# Patient Record
Sex: Female | Born: 1960
Health system: Southern US, Community
[De-identification: ages and names within clinical notes are randomized; demographics above are authoritative.]

## PROBLEM LIST (undated history)

## (undated) DIAGNOSIS — U071 COVID-19: Secondary | ICD-10-CM

## (undated) DIAGNOSIS — K319 Disease of stomach and duodenum, unspecified: Secondary | ICD-10-CM

## (undated) DIAGNOSIS — K5790 Diverticulosis of intestine, part unspecified, without perforation or abscess without bleeding: Secondary | ICD-10-CM

## (undated) DIAGNOSIS — R112 Nausea with vomiting, unspecified: Secondary | ICD-10-CM

## (undated) DIAGNOSIS — K529 Noninfective gastroenteritis and colitis, unspecified: Secondary | ICD-10-CM

## (undated) DIAGNOSIS — K219 Gastro-esophageal reflux disease without esophagitis: Secondary | ICD-10-CM

## (undated) DIAGNOSIS — Z9889 Other specified postprocedural states: Secondary | ICD-10-CM

## (undated) DIAGNOSIS — B9681 Helicobacter pylori [H. pylori] as the cause of diseases classified elsewhere: Secondary | ICD-10-CM

## (undated) DIAGNOSIS — K2970 Gastritis, unspecified, without bleeding: Secondary | ICD-10-CM

## (undated) DIAGNOSIS — E119 Type 2 diabetes mellitus without complications: Secondary | ICD-10-CM

## (undated) DIAGNOSIS — K589 Irritable bowel syndrome without diarrhea: Secondary | ICD-10-CM

## (undated) DIAGNOSIS — T7840XA Allergy, unspecified, initial encounter: Secondary | ICD-10-CM

## (undated) DIAGNOSIS — K449 Diaphragmatic hernia without obstruction or gangrene: Secondary | ICD-10-CM

## (undated) DIAGNOSIS — S82891A Other fracture of right lower leg, initial encounter for closed fracture: Secondary | ICD-10-CM

## (undated) HISTORY — DX: Other fracture of right lower leg, initial encounter for closed fracture: S82.891A

## (undated) HISTORY — DX: Irritable bowel syndrome, unspecified: K58.9

## (undated) HISTORY — DX: Type 2 diabetes mellitus without complications: E11.9

## (undated) HISTORY — DX: Allergy, unspecified, initial encounter: T78.40XA

## (undated) HISTORY — DX: Diaphragmatic hernia without obstruction or gangrene: K44.9

## (undated) HISTORY — DX: Helicobacter pylori (H. pylori) as the cause of diseases classified elsewhere: K29.70

## (undated) HISTORY — DX: Gastro-esophageal reflux disease without esophagitis: K21.9

## (undated) HISTORY — DX: Noninfective gastroenteritis and colitis, unspecified: K52.9

## (undated) HISTORY — DX: Diverticulosis of intestine, part unspecified, without perforation or abscess without bleeding: K57.90

## (undated) HISTORY — DX: Disease of stomach and duodenum, unspecified: K31.9

## (undated) HISTORY — PX: SHOULDER SURGERY: SHX246

## (undated) HISTORY — DX: Helicobacter pylori (H. pylori) as the cause of diseases classified elsewhere: B96.81

---

## 1998-12-30 ENCOUNTER — Other Ambulatory Visit: Admission: RE | Admit: 1998-12-30 | Discharge: 1998-12-30 | Payer: Self-pay | Admitting: *Deleted

## 1999-09-21 ENCOUNTER — Ambulatory Visit (HOSPITAL_COMMUNITY): Admission: RE | Admit: 1999-09-21 | Discharge: 1999-09-21 | Payer: Self-pay | Admitting: Gastroenterology

## 1999-09-23 ENCOUNTER — Ambulatory Visit (HOSPITAL_COMMUNITY): Admission: RE | Admit: 1999-09-23 | Discharge: 1999-09-23 | Payer: Self-pay | Admitting: Gastroenterology

## 1999-09-23 ENCOUNTER — Encounter: Payer: Self-pay | Admitting: Gastroenterology

## 1999-10-28 ENCOUNTER — Emergency Department (HOSPITAL_COMMUNITY): Admission: EM | Admit: 1999-10-28 | Discharge: 1999-10-28 | Payer: Self-pay | Admitting: Emergency Medicine

## 1999-11-16 ENCOUNTER — Ambulatory Visit (HOSPITAL_COMMUNITY): Admission: RE | Admit: 1999-11-16 | Discharge: 1999-11-16 | Payer: Self-pay | Admitting: Gastroenterology

## 1999-11-16 ENCOUNTER — Encounter: Payer: Self-pay | Admitting: Gastroenterology

## 1999-12-05 HISTORY — PX: CHOLECYSTECTOMY: SHX55

## 2001-04-03 ENCOUNTER — Other Ambulatory Visit: Admission: RE | Admit: 2001-04-03 | Discharge: 2001-04-03 | Payer: Self-pay | Admitting: Obstetrics and Gynecology

## 2001-12-27 ENCOUNTER — Encounter: Payer: Self-pay | Admitting: Internal Medicine

## 2001-12-27 ENCOUNTER — Encounter (INDEPENDENT_AMBULATORY_CARE_PROVIDER_SITE_OTHER): Payer: Self-pay | Admitting: Specialist

## 2001-12-27 ENCOUNTER — Ambulatory Visit (HOSPITAL_COMMUNITY): Admission: RE | Admit: 2001-12-27 | Discharge: 2001-12-27 | Payer: Self-pay | Admitting: Internal Medicine

## 2002-01-12 ENCOUNTER — Emergency Department (HOSPITAL_COMMUNITY): Admission: EM | Admit: 2002-01-12 | Discharge: 2002-01-13 | Payer: Self-pay | Admitting: Emergency Medicine

## 2002-01-13 ENCOUNTER — Encounter: Payer: Self-pay | Admitting: Emergency Medicine

## 2002-04-04 ENCOUNTER — Other Ambulatory Visit: Admission: RE | Admit: 2002-04-04 | Discharge: 2002-04-04 | Payer: Self-pay | Admitting: Obstetrics and Gynecology

## 2002-04-26 ENCOUNTER — Encounter: Admission: RE | Admit: 2002-04-26 | Discharge: 2002-04-26 | Payer: Self-pay | Admitting: Internal Medicine

## 2002-04-26 ENCOUNTER — Encounter: Payer: Self-pay | Admitting: Internal Medicine

## 2002-06-17 ENCOUNTER — Encounter: Admission: RE | Admit: 2002-06-17 | Discharge: 2002-06-17 | Payer: Self-pay | Admitting: Family Medicine

## 2002-06-17 ENCOUNTER — Encounter: Payer: Self-pay | Admitting: Family Medicine

## 2002-07-11 ENCOUNTER — Encounter: Payer: Self-pay | Admitting: General Surgery

## 2002-07-11 ENCOUNTER — Encounter: Admission: RE | Admit: 2002-07-11 | Discharge: 2002-07-11 | Payer: Self-pay | Admitting: General Surgery

## 2002-08-20 ENCOUNTER — Encounter (INDEPENDENT_AMBULATORY_CARE_PROVIDER_SITE_OTHER): Payer: Self-pay

## 2002-08-20 ENCOUNTER — Observation Stay (HOSPITAL_COMMUNITY): Admission: RE | Admit: 2002-08-20 | Discharge: 2002-08-21 | Payer: Self-pay | Admitting: General Surgery

## 2003-04-13 ENCOUNTER — Other Ambulatory Visit: Admission: RE | Admit: 2003-04-13 | Discharge: 2003-04-13 | Payer: Self-pay | Admitting: Obstetrics and Gynecology

## 2003-10-24 ENCOUNTER — Inpatient Hospital Stay (HOSPITAL_COMMUNITY): Admission: AD | Admit: 2003-10-24 | Discharge: 2003-10-24 | Payer: Self-pay | Admitting: Obstetrics and Gynecology

## 2004-01-08 ENCOUNTER — Emergency Department (HOSPITAL_COMMUNITY): Admission: EM | Admit: 2004-01-08 | Discharge: 2004-01-08 | Payer: Self-pay | Admitting: Emergency Medicine

## 2004-06-14 ENCOUNTER — Other Ambulatory Visit: Admission: RE | Admit: 2004-06-14 | Discharge: 2004-06-14 | Payer: Self-pay | Admitting: Obstetrics and Gynecology

## 2004-09-13 ENCOUNTER — Ambulatory Visit (HOSPITAL_BASED_OUTPATIENT_CLINIC_OR_DEPARTMENT_OTHER): Admission: RE | Admit: 2004-09-13 | Discharge: 2004-09-13 | Payer: Self-pay | Admitting: Orthopaedic Surgery

## 2005-01-09 ENCOUNTER — Ambulatory Visit (HOSPITAL_COMMUNITY): Admission: RE | Admit: 2005-01-09 | Discharge: 2005-01-09 | Payer: Self-pay | Admitting: Obstetrics and Gynecology

## 2005-01-17 ENCOUNTER — Ambulatory Visit: Payer: Self-pay | Admitting: Family Medicine

## 2005-08-08 ENCOUNTER — Ambulatory Visit: Payer: Self-pay | Admitting: Internal Medicine

## 2005-09-11 ENCOUNTER — Other Ambulatory Visit: Admission: RE | Admit: 2005-09-11 | Discharge: 2005-09-11 | Payer: Self-pay | Admitting: Obstetrics and Gynecology

## 2005-09-13 ENCOUNTER — Emergency Department (HOSPITAL_COMMUNITY): Admission: EM | Admit: 2005-09-13 | Discharge: 2005-09-13 | Payer: Self-pay | Admitting: Emergency Medicine

## 2005-09-15 ENCOUNTER — Emergency Department (HOSPITAL_COMMUNITY): Admission: EM | Admit: 2005-09-15 | Discharge: 2005-09-15 | Payer: Self-pay | Admitting: Emergency Medicine

## 2006-08-20 ENCOUNTER — Ambulatory Visit: Payer: Self-pay | Admitting: Internal Medicine

## 2006-09-19 ENCOUNTER — Other Ambulatory Visit: Admission: RE | Admit: 2006-09-19 | Discharge: 2006-09-19 | Payer: Self-pay | Admitting: Obstetrics and Gynecology

## 2006-11-13 ENCOUNTER — Ambulatory Visit (HOSPITAL_COMMUNITY): Admission: RE | Admit: 2006-11-13 | Discharge: 2006-11-13 | Payer: Self-pay | Admitting: Obstetrics and Gynecology

## 2007-09-12 ENCOUNTER — Ambulatory Visit: Payer: Self-pay | Admitting: Internal Medicine

## 2007-09-26 ENCOUNTER — Ambulatory Visit: Payer: Self-pay | Admitting: Internal Medicine

## 2008-06-25 ENCOUNTER — Ambulatory Visit (HOSPITAL_COMMUNITY): Admission: RE | Admit: 2008-06-25 | Discharge: 2008-06-25 | Payer: Self-pay | Admitting: Obstetrics and Gynecology

## 2008-12-03 ENCOUNTER — Ambulatory Visit: Payer: Self-pay | Admitting: Diagnostic Radiology

## 2008-12-03 ENCOUNTER — Emergency Department (HOSPITAL_BASED_OUTPATIENT_CLINIC_OR_DEPARTMENT_OTHER): Admission: EM | Admit: 2008-12-03 | Discharge: 2008-12-03 | Payer: Self-pay | Admitting: Emergency Medicine

## 2008-12-04 ENCOUNTER — Ambulatory Visit: Payer: Self-pay | Admitting: Diagnostic Radiology

## 2008-12-04 ENCOUNTER — Emergency Department (HOSPITAL_BASED_OUTPATIENT_CLINIC_OR_DEPARTMENT_OTHER): Admission: EM | Admit: 2008-12-04 | Discharge: 2008-12-04 | Payer: Self-pay | Admitting: Emergency Medicine

## 2008-12-04 ENCOUNTER — Encounter (INDEPENDENT_AMBULATORY_CARE_PROVIDER_SITE_OTHER): Payer: Self-pay | Admitting: *Deleted

## 2008-12-17 DIAGNOSIS — Z8719 Personal history of other diseases of the digestive system: Secondary | ICD-10-CM | POA: Insufficient documentation

## 2008-12-17 DIAGNOSIS — K449 Diaphragmatic hernia without obstruction or gangrene: Secondary | ICD-10-CM | POA: Insufficient documentation

## 2008-12-17 DIAGNOSIS — K219 Gastro-esophageal reflux disease without esophagitis: Secondary | ICD-10-CM | POA: Insufficient documentation

## 2008-12-17 DIAGNOSIS — K589 Irritable bowel syndrome without diarrhea: Secondary | ICD-10-CM | POA: Insufficient documentation

## 2008-12-19 ENCOUNTER — Encounter (INDEPENDENT_AMBULATORY_CARE_PROVIDER_SITE_OTHER): Payer: Self-pay | Admitting: *Deleted

## 2008-12-21 ENCOUNTER — Ambulatory Visit: Payer: Self-pay | Admitting: Internal Medicine

## 2008-12-21 DIAGNOSIS — R1011 Right upper quadrant pain: Secondary | ICD-10-CM | POA: Insufficient documentation

## 2009-02-20 ENCOUNTER — Emergency Department (HOSPITAL_BASED_OUTPATIENT_CLINIC_OR_DEPARTMENT_OTHER): Admission: EM | Admit: 2009-02-20 | Discharge: 2009-02-20 | Payer: Self-pay | Admitting: Emergency Medicine

## 2009-02-22 ENCOUNTER — Emergency Department (HOSPITAL_BASED_OUTPATIENT_CLINIC_OR_DEPARTMENT_OTHER): Admission: EM | Admit: 2009-02-22 | Discharge: 2009-02-22 | Payer: Self-pay | Admitting: Emergency Medicine

## 2009-04-22 DIAGNOSIS — M545 Low back pain, unspecified: Secondary | ICD-10-CM | POA: Insufficient documentation

## 2009-05-06 ENCOUNTER — Emergency Department (HOSPITAL_BASED_OUTPATIENT_CLINIC_OR_DEPARTMENT_OTHER): Admission: EM | Admit: 2009-05-06 | Discharge: 2009-05-06 | Payer: Self-pay | Admitting: Emergency Medicine

## 2009-05-06 ENCOUNTER — Encounter (INDEPENDENT_AMBULATORY_CARE_PROVIDER_SITE_OTHER): Payer: Self-pay | Admitting: *Deleted

## 2009-05-06 ENCOUNTER — Ambulatory Visit: Payer: Self-pay | Admitting: Radiology

## 2009-05-07 ENCOUNTER — Encounter: Payer: Self-pay | Admitting: Internal Medicine

## 2009-05-09 ENCOUNTER — Encounter (INDEPENDENT_AMBULATORY_CARE_PROVIDER_SITE_OTHER): Payer: Self-pay | Admitting: *Deleted

## 2009-05-09 ENCOUNTER — Emergency Department (HOSPITAL_BASED_OUTPATIENT_CLINIC_OR_DEPARTMENT_OTHER): Admission: EM | Admit: 2009-05-09 | Discharge: 2009-05-09 | Payer: Self-pay | Admitting: Emergency Medicine

## 2009-05-11 ENCOUNTER — Ambulatory Visit: Payer: Self-pay | Admitting: Diagnostic Radiology

## 2009-05-11 ENCOUNTER — Emergency Department (HOSPITAL_BASED_OUTPATIENT_CLINIC_OR_DEPARTMENT_OTHER): Admission: EM | Admit: 2009-05-11 | Discharge: 2009-05-11 | Payer: Self-pay | Admitting: Emergency Medicine

## 2009-05-11 ENCOUNTER — Telehealth: Payer: Self-pay | Admitting: Internal Medicine

## 2009-05-11 ENCOUNTER — Encounter (INDEPENDENT_AMBULATORY_CARE_PROVIDER_SITE_OTHER): Payer: Self-pay | Admitting: *Deleted

## 2009-05-13 ENCOUNTER — Encounter: Payer: Self-pay | Admitting: Cardiology

## 2009-05-24 ENCOUNTER — Ambulatory Visit: Payer: Self-pay | Admitting: Internal Medicine

## 2009-05-24 DIAGNOSIS — R079 Chest pain, unspecified: Secondary | ICD-10-CM | POA: Insufficient documentation

## 2009-05-28 ENCOUNTER — Ambulatory Visit: Payer: Self-pay | Admitting: Internal Medicine

## 2009-05-31 ENCOUNTER — Telehealth: Payer: Self-pay | Admitting: Internal Medicine

## 2009-06-01 ENCOUNTER — Encounter: Payer: Self-pay | Admitting: Internal Medicine

## 2009-06-01 ENCOUNTER — Emergency Department (HOSPITAL_BASED_OUTPATIENT_CLINIC_OR_DEPARTMENT_OTHER): Admission: EM | Admit: 2009-06-01 | Discharge: 2009-06-01 | Payer: Self-pay | Admitting: Emergency Medicine

## 2009-06-02 ENCOUNTER — Telehealth: Payer: Self-pay | Admitting: Internal Medicine

## 2009-06-28 ENCOUNTER — Ambulatory Visit: Payer: Self-pay | Admitting: Internal Medicine

## 2009-07-13 ENCOUNTER — Encounter: Admission: RE | Admit: 2009-07-13 | Discharge: 2009-07-13 | Payer: Self-pay | Admitting: Obstetrics and Gynecology

## 2009-08-23 DIAGNOSIS — L719 Rosacea, unspecified: Secondary | ICD-10-CM | POA: Insufficient documentation

## 2009-08-23 DIAGNOSIS — F411 Generalized anxiety disorder: Secondary | ICD-10-CM | POA: Insufficient documentation

## 2009-10-07 DIAGNOSIS — M2550 Pain in unspecified joint: Secondary | ICD-10-CM | POA: Insufficient documentation

## 2009-10-12 ENCOUNTER — Telehealth: Payer: Self-pay | Admitting: Internal Medicine

## 2009-10-12 DIAGNOSIS — E559 Vitamin D deficiency, unspecified: Secondary | ICD-10-CM | POA: Insufficient documentation

## 2009-10-12 DIAGNOSIS — E538 Deficiency of other specified B group vitamins: Secondary | ICD-10-CM | POA: Insufficient documentation

## 2010-01-25 DIAGNOSIS — J309 Allergic rhinitis, unspecified: Secondary | ICD-10-CM | POA: Insufficient documentation

## 2010-01-25 DIAGNOSIS — M2569 Stiffness of other specified joint, not elsewhere classified: Secondary | ICD-10-CM | POA: Insufficient documentation

## 2010-04-15 DIAGNOSIS — R232 Flushing: Secondary | ICD-10-CM | POA: Insufficient documentation

## 2010-04-15 DIAGNOSIS — F411 Generalized anxiety disorder: Secondary | ICD-10-CM | POA: Insufficient documentation

## 2010-06-04 ENCOUNTER — Emergency Department (HOSPITAL_BASED_OUTPATIENT_CLINIC_OR_DEPARTMENT_OTHER): Admission: EM | Admit: 2010-06-04 | Discharge: 2010-06-04 | Payer: Self-pay | Admitting: Emergency Medicine

## 2010-08-04 LAB — HM MAMMOGRAPHY: HM Mammogram: NORMAL

## 2010-09-22 ENCOUNTER — Telehealth: Payer: Self-pay | Admitting: Internal Medicine

## 2010-10-06 ENCOUNTER — Ambulatory Visit: Payer: Self-pay | Admitting: Diagnostic Radiology

## 2010-10-06 ENCOUNTER — Ambulatory Visit (HOSPITAL_BASED_OUTPATIENT_CLINIC_OR_DEPARTMENT_OTHER): Admission: RE | Admit: 2010-10-06 | Discharge: 2010-10-06 | Payer: Self-pay | Admitting: Obstetrics and Gynecology

## 2011-01-03 NOTE — Progress Notes (Signed)
Summary: Dexilant refill  Medications Added DEXILANT 60 MG CPDR (DEXLANSOPRAZOLE) Take one by mouth every morning 30 minutes before breakfast.       Phone Note Call from Patient Call back at Home Phone [phone]   Call For: Dr Juanda Chance Summary of Call: Dexilant says she has 4 refills that expire in Nov. Will she need a office visit to get these four more refills? Initial call taken by: Leanor Kail [facility],  September 22, 2010 9:49 AM  Follow-up for Phone Call        Advised patient that no office visit should be needed as long as she is not having any problems. I will send her some more refills. Patient verbalizes understanding. Follow-up by: Lamona Curl CMA (AAMA),  September 22, 2010 11:02 AM    New/Updated Medications: DEXILANT 60 MG CPDR (DEXLANSOPRAZOLE) Take one by mouth every morning 30 minutes before breakfast. Prescriptions: DEXILANT 60 MG CPDR (DEXLANSOPRAZOLE) Take one by mouth every morning 30 minutes before breakfast.  #30 x 4   Entered by:   Lamona Curl CMA (AAMA)   Authorized by:   Hart Carwin MD   Signed by:   Lamona Curl CMA (AAMA) on 09/22/2010   Method used:   Electronically to        [id number] (retail)       [address]       [place], [place]  [address]       Ph: [phone] or [phone]       Fax: [phone]   RxID:   [id number]

## 2011-01-10 ENCOUNTER — Ambulatory Visit: Payer: Self-pay | Admitting: Family Medicine

## 2011-01-16 ENCOUNTER — Ambulatory Visit (INDEPENDENT_AMBULATORY_CARE_PROVIDER_SITE_OTHER): Payer: BC Managed Care – PPO | Admitting: Family Medicine

## 2011-01-16 ENCOUNTER — Other Ambulatory Visit: Payer: Self-pay | Admitting: Family Medicine

## 2011-01-16 ENCOUNTER — Ambulatory Visit
Admission: RE | Admit: 2011-01-16 | Discharge: 2011-01-16 | Disposition: A | Discharge status: Home / Self Care | Payer: BC Managed Care – PPO | Source: Ambulatory Visit | Attending: Family Medicine | Admitting: Family Medicine

## 2011-01-16 ENCOUNTER — Encounter: Payer: Self-pay | Admitting: Family Medicine

## 2011-01-16 DIAGNOSIS — M542 Cervicalgia: Secondary | ICD-10-CM

## 2011-01-16 DIAGNOSIS — M5412 Radiculopathy, cervical region: Secondary | ICD-10-CM | POA: Insufficient documentation

## 2011-01-16 DIAGNOSIS — R079 Chest pain, unspecified: Secondary | ICD-10-CM

## 2011-01-24 ENCOUNTER — Encounter (INDEPENDENT_AMBULATORY_CARE_PROVIDER_SITE_OTHER): Payer: BC Managed Care – PPO | Admitting: Obstetrics & Gynecology

## 2011-01-24 ENCOUNTER — Other Ambulatory Visit: Payer: Self-pay | Admitting: Obstetrics & Gynecology

## 2011-01-24 DIAGNOSIS — Z01419 Encounter for gynecological examination (general) (routine) without abnormal findings: Secondary | ICD-10-CM

## 2011-01-24 DIAGNOSIS — Z1272 Encounter for screening for malignant neoplasm of vagina: Secondary | ICD-10-CM

## 2011-01-25 NOTE — Assessment & Plan Note (Signed)
Summary: NOV: Neck Pain   Vital Signs:  Patient profile:   50 year old female Height:      68 inches Weight:      208 pounds Pulse rate:   87 / minute BP sitting:   121 / 82  (right arm) Cuff size:   regular  Vitals Entered By: Avon Gully CMA, Duncan Dull) (January 16, 2011 10:37 AM) CC: NP-est care   Primary Care Provider:  Maryelizabeth Rowan, MD  CC:  NP-est care.  History of Present Illness: Neck pain raddiating into her left shoulder and into her upper back. Keeping HA  in the back of her head for several weeks.  Occ will radiate to the top of her head.  Gets massalge therapy in once and month and that does help.  Now getting tingling pain in her posterior neck. No old trauma. Pain on and off for years.    Had xrays of her neck about 5 years ago.  Occ pain will radiate inot her left upper chest. Had an normal stress test about 2 years ago with Dr. Rudean Haskell. Using mobic for pain relief but with her GI hx tries not to use it too often.   Current Medications (verified): 1)  Alprazolam 0.25 Mg Tabs (Alprazolam) .... One or Two Tablets By Mouth Three Times A Day As Needed 2)  Dexilant 60 Mg Cpdr (Dexlansoprazole) .... Take One By Mouth Every Morning 30 Minutes Before Breakfast. 3)  Zaditor 0.025 % Soln (Ketotifen Fumarate) .... One Drop Daily Both Eyes 4)  Zeosa 0.4-35 Mg-Mcg Chew (Norethin-Eth Estradiol-Fe) 5)  Mobic 15 Mg Tabs (Meloxicam) .... Take One Tablet By Mouth Once A Day 6)  Flonase 50 Mcg/act Susp (Fluticasone Propionate)  Allergies (verified): 1)  ! * Ivp Dye 2)  Pcn  Comments:  Nurse/Medical Assistant: The patient's medications and allergies were reviewed with the patient and were updated in the Medication and Allergy Lists. 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  Alcohol Use - no Daily Caffeine Use Dr. Reino Kent x 2  Past Surgical History: Shoulder surgery-bilateral for bone spurs and torn RC.  cholecystectomy  Family History: No FH of Colon Cancer: Family History of Heart Disease: Father MI in his mid-30s, Grandparents Family History of Diabetes: MGM, Mat Uncle Family History of Ovarian Cancer: Mat. Aunt x 2 HTN: Mom and Dad Cholesterol : Mom and DAd  Physical Exam  General:  Well-developed,well-nourished,in no acute distress; alert,appropriate and cooperative throughout examination Head:  Normocephalic and atraumatic without obvious abnormalities. No apparent alopecia or balding. Lungs:  Normal respiratory effort, chest expands symmetrically. Lungs are clear to auscultation, no crackles or wheezes. Heart:  Normal rate and regular rhythm. S1 and S2 normal without gallop, murmur, click, rub or other extra sounds. NO carotid bruits.  Msk:  Neck wtih norm flexion/extension. Normal rotation right and left.  Slighly dec side bending to the right.  Shoudlers with NROM and strength 5/5.  Nontender over the cervical and upper thoracici spine. Nontender paraspinous muscles in this area as well. Non tender over teh left shoulder.   Skin:  no rashes.   Cervical Nodes:  No lymphadenopathy noted Psych:  Cognition and judgment  appear intact. Alert and cooperative with normal attention span and concentration. No apparent delusions, illusions, hallucinations   Impression & Recommendations:  Problem # 1:  NECK PAIN (ICD-723.1) Exam fairly normla except some limitation with sidebending to the left. Discussed cougl be OA vs cervical disc dz since pain radiates into her left arm. Will start with cervial plain film. Can use mobic as needed. Offered muscle relaxer as needed.   Will call with results. Will consider PT since massage does seem to help her.   Her updated medication list for this problem includes:    Mobic 15 Mg Tabs (Meloxicam) .Marland Kitchen... Take one tablet by mouth once a day  Orders: T-DG Cervical Spine 2-3 Views (11914)  Problem # 2:  CHEST PAIN (ICD-786.50) Likely not cardiac related since had stress test 2 years ago.   Complete Medication List: 1)  Alprazolam 0.25 Mg Tabs (Alprazolam) .... One or two tablets by mouth three times a day as needed 2)  Dexilant 60 Mg Cpdr (Dexlansoprazole) .... Take one by mouth every morning 30 minutes before breakfast. 3)  Zaditor 0.025 % Soln (Ketotifen fumarate) .... One drop daily both eyes 4)  Zeosa 0.4-35 Mg-mcg Chew (Norethin-eth estradiol-fe) 5)  Mobic 15 Mg Tabs (Meloxicam) .... Take one tablet by mouth once a day 6)  Flonase 50 Mcg/act Susp (Fluticasone propionate)  Patient Instructions: 1)   We will you with the xray results.   Contraindications/Deferment of Procedures/Staging:    Test/Procedure: FLU VAX    Reason for deferment: patient declined    Orders Added: 1)  T-DG Cervical Spine 2-3 Views [72040] 2)  Est. Patient Level IV [78295] 3)  New Patient Level III [62130]    TD Result Date:  12/05/2007 TD Next Due:  10 yr Mammogram Result Date:  08/04/2010 Mammogram Result:  normal Mammogram Next Due:  1 yr

## 2011-02-19 LAB — POCT CARDIAC MARKERS
Myoglobin, poc: 56 ng/mL (ref 12–200)
Troponin i, poc: <0.05 ng/mL (ref 0.00–0.09)

## 2011-02-19 LAB — CBC
Platelets: 377 10*3/uL (ref 150–400)
RDW: 12.6 % (ref 11.5–15.5)
WBC: 11 10*3/uL — ABNORMAL HIGH (ref 4.0–10.5)

## 2011-02-19 LAB — DIFFERENTIAL
Basophils Relative: 0 % (ref 0–1)
Eosinophils Absolute: 0.1 10*3/uL (ref 0.0–0.7)
Lymphs Abs: 2.8 10*3/uL (ref 0.7–4.0)
Monocytes Absolute: 0.7 10*3/uL (ref 0.1–1.0)
Monocytes Relative: 7 % (ref 3–12)

## 2011-02-19 LAB — COMPREHENSIVE METABOLIC PANEL
ALT: 9 U/L (ref 0–35)
Albumin: 4 g/dL (ref 3.5–5.2)
Alkaline Phosphatase: 90 U/L (ref 39–117)
GFR calc Af Amer: >60 mL/min (ref >60)
Potassium: 3.4 mEq/L — ABNORMAL LOW (ref 3.5–5.1)
Sodium: 143 mEq/L (ref 135–145)
Total Protein: 8 g/dL (ref 6.0–8.3)

## 2011-02-24 NOTE — Assessment & Plan Note (Signed)
NAME:  Nicole Mata, UN NO.:  [id number]  MEDICAL RECORD NO.:  [id number]           PATIENT TYPE:  LOCATION:  [facility] [place]           FACILITY:  PHYSICIAN:  Elsie Lincoln, MD      DATE OF BIRTH:  Aug 05, 1961  DATE OF SERVICE:  01/24/2011                                 CLINIC NOTE  The patient is a 50 year old G1 para 0-0-1-0 female who presents for yearly exam.  The patient is a new patient to our practice.  The patient is on birth control pills for dysmenorrhea and hot flashes and flushes. She has been on them for a couple of years for this reason.  She does not want to go off on them at this time.  She understands the risk of breast cancer and stroke and clot.  Given that she is perimenopausal, she is instructed to go off.  We need to have a trial going off them in the next year or two.  Given these risks, and we need to see if she is in menopause.  The patient has a monthly bleed as expected on a 21-day pill pack.  The patient is not sexually active and has no complaints related to her gynecological health.  PAST MEDICAL HISTORY:  Reflux and irritable bowel syndrome.  PAST SURGICAL HISTORY:  Cholecystectomy, bones sores on her right and left shoulders that were removed.  OB HISTORY:  Miscarriage x2.  GYN HISTORY:  No history of abnormal Pap smears, endometriosis, fibroid tumors, ovarian cysts, or sexually transmitted diseases.  MEDICATIONS:  Jolessa, meloxicam, and Dexilant.  ALLERGIES:  PENICILLIN.  SOCIAL HISTORY:  She is employed as a Clinical biochemist.  She lives with her parents.  She does not smoke, drink, or do drugs.  She has never been sexually or physically abused.  FAMILY HISTORY:  Positive for two different aunts having ovarian cancer. These are both sisters of her mother.  She needs courage to ask her mother if she has ever been tested for BRCA 1 and 2.  Her mother and father had high blood pressure.  Father and grandfather had  heart disease.  Her grandmother, uncle, and brother had diabetes.  Systemic review is positive for 10-pound weight gain, hot flashes, weakness, fatigue, headache, cough, leg cramp, diarrhea, and abdominal pain.  The patient has a primary care doctor, Dr. Consepcion Hearing she recently established and getting these concerns addressed.  PHYSICAL EXAMINATION:  VITAL SIGNS:  Pulse 91, blood pressure 118/78, weight 213, height 6-7 inches. GENERAL:  Well-nourished, well-developed, in no apparent stress. HEENT:  Normocephalic, atraumatic.  Good dentition. THYROID:  No masses. LUNGS:  Clear to auscultation bilaterally. HEART:  Regular rate and rhythm. BREASTS:  No masses and nontender.  No nipple discharge.  No lymphadenopathy. ABDOMEN:  Soft, nontender.  No organomegaly, no hernia. GENITALIA:  Tanner 5.  Vagina pink.  Normal rugae.  Cervix closed and nontender.  Uterus nontender.  Adnexa, no masses, nontender. Rectovaginal, no masses, no nodularity.  No cystocele or rectocele. EXTREMITIES:  No edema.  ASSESSMENT AND PLAN:  A 49-year female well-woman exam. 1. Pap smear. 2. Followup primary care physician for all the medical concerns. 3. The patient to wean off birth  control pills at some point the next     year or two to see if she is in menopause.          ______________________________ Elsie Lincoln, MD    KL/MEDQ  D:  01/24/2011  T:  01/25/2011  Job:  [id number]

## 2011-03-13 LAB — BASIC METABOLIC PANEL
BUN: 11 mg/dL (ref 6–23)
CO2: 29 mEq/L (ref 19–32)
CO2: 30 mEq/L (ref 19–32)
Calcium: 9.3 mg/dL (ref 8.4–10.5)
Chloride: 102 mEq/L (ref 96–112)
Chloride: 104 mEq/L (ref 96–112)
Chloride: 105 mEq/L (ref 96–112)
Creatinine, Ser: 0.9 mg/dL (ref 0.4–1.2)
GFR calc Af Amer: >60 mL/min (ref >60)
GFR calc Af Amer: >60 mL/min (ref >60)
GFR calc non Af Amer: >60 mL/min (ref >60)
GFR calc non Af Amer: >60 mL/min (ref >60)
Glucose, Bld: 98 mg/dL (ref 70–99)
Potassium: 3.5 mEq/L (ref 3.5–5.1)
Potassium: 3.7 mEq/L (ref 3.5–5.1)
Sodium: 142 mEq/L (ref 135–145)
Sodium: 142 mEq/L (ref 135–145)
Sodium: 143 mEq/L (ref 135–145)

## 2011-03-13 LAB — URINALYSIS, ROUTINE W REFLEX MICROSCOPIC
Ketones, ur: 15 mg/dL — AB
Leukocytes, UA: NEGATIVE
Nitrite: NEGATIVE
Nitrite: NEGATIVE
Specific Gravity, Urine: 1.027 (ref 1.005–1.030)
Specific Gravity, Urine: 1.024 (ref 1.005–1.030)
Urobilinogen, UA: 0.2 mg/dL (ref 0.0–1.0)
Urobilinogen, UA: 0.2 mg/dL (ref 0.0–1.0)
pH: 6 (ref 5.0–8.0)
pH: 6.5 (ref 5.0–8.0)

## 2011-03-13 LAB — DIFFERENTIAL
Eosinophils Absolute: 0.1 10*3/uL (ref 0.0–0.7)
Eosinophils Relative: 1 % (ref 0–5)
Lymphocytes Relative: 20 % (ref 12–46)
Lymphs Abs: 1.5 10*3/uL (ref 0.7–4.0)
Lymphs Abs: 1.8 10*3/uL (ref 0.7–4.0)
Monocytes Absolute: 0.6 10*3/uL (ref 0.1–1.0)
Monocytes Relative: 5 % (ref 3–12)
Monocytes Relative: 7 % (ref 3–12)
Neutro Abs: 6.1 10*3/uL (ref 1.7–7.7)
Neutrophils Relative %: 71 % (ref 43–77)

## 2011-03-13 LAB — POCT CARDIAC MARKERS
CKMB, poc: <1 ng/mL — ABNORMAL LOW (ref 1.0–8.0)
CKMB, poc: <1 ng/mL — ABNORMAL LOW (ref 1.0–8.0)
CKMB, poc: <1 ng/mL — ABNORMAL LOW (ref 1.0–8.0)
Myoglobin, poc: 41.8 ng/mL (ref 12–200)
Myoglobin, poc: 48.1 ng/mL (ref 12–200)
Myoglobin, poc: 61.3 ng/mL (ref 12–200)
Troponin i, poc: <0.05 ng/mL (ref 0.00–0.09)
Troponin i, poc: <0.05 ng/mL (ref 0.00–0.09)

## 2011-03-13 LAB — URINE CULTURE

## 2011-03-13 LAB — URINE MICROSCOPIC-ADD ON

## 2011-03-13 LAB — CBC
HCT: 38.1 % (ref 36.0–46.0)
Hemoglobin: 13.1 g/dL (ref 12.0–15.0)
Hemoglobin: 13.9 g/dL (ref 12.0–15.0)
MCHC: 34.2 g/dL (ref 30.0–36.0)
MCV: 90.6 fL (ref 78.0–100.0)
RBC: 4.21 MIL/uL (ref 3.87–5.11)
RBC: 4.52 MIL/uL (ref 3.87–5.11)
WBC: 7.4 10*3/uL (ref 4.0–10.5)
WBC: 8.6 10*3/uL (ref 4.0–10.5)

## 2011-03-13 LAB — RAPID STREP SCREEN (MED CTR MEBANE ONLY): Streptococcus, Group A Screen (Direct): NEGATIVE

## 2011-03-13 LAB — PREGNANCY, URINE
Preg Test, Ur: NEGATIVE
Preg Test, Ur: NEGATIVE

## 2011-03-16 LAB — URINALYSIS, ROUTINE W REFLEX MICROSCOPIC
Bilirubin Urine: NEGATIVE
Glucose, UA: NEGATIVE mg/dL
Ketones, ur: NEGATIVE mg/dL
Leukocytes, UA: NEGATIVE
Protein, ur: NEGATIVE mg/dL
pH: 5.5 (ref 5.0–8.0)

## 2011-03-16 LAB — URINE CULTURE: Colony Count: 5000

## 2011-03-16 LAB — HEMOGLOBIN AND HEMATOCRIT, BLOOD
HCT: 36.9 % (ref 36.0–46.0)
Hemoglobin: 12.6 g/dL (ref 12.0–15.0)

## 2011-03-16 LAB — URINE MICROSCOPIC-ADD ON

## 2011-03-20 ENCOUNTER — Other Ambulatory Visit: Payer: Self-pay | Admitting: Internal Medicine

## 2011-03-20 MED ORDER — DEXLANSOPRAZOLE 60 MG PO CPDR: 60.0000 mg | DELAYED_RELEASE_CAPSULE | Freq: Every day | ORAL | Status: DC

## 2011-03-20 NOTE — Telephone Encounter (Signed)
Advised patient that we never received anything from her pharmacy. Rx has now been sent to [facility] per patient request. I have asked patient to make an appointment before 06/2011 as it will have been 2 years since she has been seen. Patient verbalizes understanding.

## 2011-04-19 ENCOUNTER — Ambulatory Visit (INDEPENDENT_AMBULATORY_CARE_PROVIDER_SITE_OTHER): Payer: BC Managed Care – PPO | Admitting: Obstetrics & Gynecology

## 2011-04-19 DIAGNOSIS — N951 Menopausal and female climacteric states: Secondary | ICD-10-CM

## 2011-04-20 NOTE — Assessment & Plan Note (Signed)
NAMEMarland Kitchen  Crissie Figures NO.:  [id number]  MEDICAL RECORD NO.:  [id number]           PATIENT TYPE:  LOCATION:  [facility] [place]           FACILITY:  PHYSICIAN:  Elsie Lincoln, MD           DATE OF BIRTH:  DATE OF SERVICE:  04/19/2011                                 CLINIC NOTE  The patient is a 50 year old female who presents for menopausal symptoms.  The patient was on OCPs for many years and were recently stopped after being a month off the birth control pills.  FSH was 41.4. She has not had a period since early April.  She is having hot flashes and flushes approximately 1-2 a day and some problems with sleeping. She also has a decreased energy.  Otherwise, she may still be having occasional cycles given that she has only been 6 weeks out of cycle, I cannot guarantee that this she will never have another bleed again. However, given that her FSH level is elevated and she is having symptoms is most likely she is in menopause or very near and I told her that the menopause is retrospective diagnosis of no bleeding for a year.  We talked about hormone replacement therapy for her symptoms and she would like to stay within this.  She has 2 second-degree relatives with ovarian cancer and is worried about this for herself.  We talked about getting tested for the BRCA I and II gene and she has agreed to do this today.  I also talked about nonhormonal management of menopausal symptoms.  I gave her handout from up-to-date and she is going to read this and then will consider gabapentin, Effexor, Celexa or Paxil.  She has been on black cohosh before and did not like the way later feel and there are some evidence of increased breast tissues like estrogen and this would not be satisfactory for her.  We also talked about 1200 mg a day of calcium and vitamin D supplementation.  She has agreed to start this in divided doses.  ASSESSMENT/PLAN:  A 50 year old female with  premenopausal symptoms. 1. The patient will read handouts and call in to see if she would like     to start a therapy. 2. BRCA I and II testing.          ______________________________ Elsie Lincoln, MD    KL/MEDQ  D:  04/19/2011  T:  04/19/2011  Job:  [id number]

## 2011-04-21 NOTE — Procedures (Signed)
[facility]  Patient:    Nicole Mata, Nicole Mata Visit Number: [id number] MRN: [id number]          Service Type: Attending:  Hedwig Morton. Juanda Chance, M.D. [facility] Dictated by:   Hedwig Morton. Juanda Chance, M.D. [facility] Proc. Date: 12/27/01 Adm. Date:  12/27/01   CC:         Angelena Sole, M.D. [facility]                           Procedure Report  PROCEDURE:  Upper endoscopy.  ENDOSCOPIST:  Hedwig Morton. Juanda Chance, M.D.  INDICATIONS:  This 50 year old white female has had continuous heartburn and epigastric pain as well as irritable bowel syndrome like symptoms alternating with diarrhea and constipation.  The irritable bowel syndrome symptoms have been improved on Levsin 0.125 mg 3 times a day but she has not responded well to aciphex 20 mg twice a day or nexium 40 mg p.o. b.i.d.  She does not smoke, does not drink caffeine and denies any particular stress.  She is undergoing upper endoscopy to further evaluate for gastroesophageal reflux disease.  ENDOSCOPE:  Olympus single channel videoscope.  SEDATION:  Versed 5 mg IV, Demerol 50 mg IV  DESCRIPTION OF PROCEDURE:  The Olympus single channel videoscope was passed directly into the posterior pharynx and esophagus.  The patient was monitored by pulse oximetry.  Her oxygen saturations were normal.  The proximate and mid esophagus mucosa was unremarkable.  In the distal esophagus the squamocolumnar junction appeared normal.  There was no evidence of erosions, inflammatory changes, or fibrous tissue.  There was here were no significant hiatal hernia.  Stomach:  the stomach was insufflated with air and showed normal gastric folds.  There was no bile in the stomach or food.  There were a few streaks of erythema in the gastric antrum.  These were biopsied for Clo-test.  Pyloric outlet was normal.  Retroflexion of endoscope revealed 2 erosions in the fundus of the stomach which were covered with coffee ground material.  Duodenum:  The duodenum,  duodenal bulb and descending duodenum were normal.  IMPRESSION:  Nonspecific gastritis.  Fundus of the stomach status post Clo-test.  PLAN: 1. Resume nexium 40 mg p.o. b.i.d. 2. 24 hour intraesophageal pH probe to assess her reflux.  This will be done    while patient is not taking nexium. 3. Continue IBS regimen. Dictated by:   Hedwig Morton. Juanda Chance, M.D. [facility] Attending:  Hedwig Morton. Juanda Chance, M.D. [facility] DD:  12/27/01 TD:  12/27/01 Job: [id number]

## 2011-04-21 NOTE — Op Note (Signed)
NAME:  Nicole Mata, Nicole Mata                         ACCOUNT NO.:  [id number]   MEDICAL RECORD NO.:  [id number]                   PATIENT TYPE:  OBV   LOCATION:  0357                                 FACILITY:  [facility]   PHYSICIAN:  Ollen Gross. Vernell Morgans, M.D.              DATE OF BIRTH:  01-Nov-1961   DATE OF PROCEDURE:  08/20/2002  DATE OF DISCHARGE:                                 OPERATIVE REPORT   PREOPERATIVE DIAGNOSIS:  Biliary colic.   POSTOPERATIVE DIAGNOSIS:  Cholecystitis.   PROCEDURE:  Laparoscopic cholecystectomy.   SURGEON:  Ollen Gross. Carolynne Edouard, M.D.   ASSISTANT:  Currie Paris, M.D.   ANESTHESIA:  General endotracheal.   DESCRIPTION OF PROCEDURE:  After informed consent was obtained, the patient  was brought to the operating room, placed in supine position on the  operating table. After adequate induction of general anesthesia, the  patient's abdomen was prepped with Betadine and draped in the usual sterile  manner. The area below the umbilicus was infiltrated with 0.25% Marcaine and  a small incision was made with a 15 blade knife. This incision was carried  down through the subcutaneous tissue with blunt dissection with [employer]  retractors and a Kelly clamp until the linea alba was identified and linea  alba was incised with a 15 blade knife and each side was grasped with Kocher  clamps and elevated anteriorly. The preperitoneal space was then probed  bluntly with a hemostat until the peritoneum was opened and access was  gained to the abdominal cavity and a #0 Vicryl pursestring stitch was then  placed in the fascia surrounding this opening. An Hasson cannula was placed  through this opening and __________ placed at the previously placed Vicryl  pursestring stitch. The abdomen was then insufflated with carbon dioxide  without difficulty. The patient was placed in the head up position and the  laparoscope was inserted through the Hasson cannula. The dome of the  gallbladder and liver edge were readily identified. Next, a small transverse  upper midline incision was made with a 15 blade knife and after infiltrating  this area with 0.25% Marcaine, a 10 mm port was then placed bluntly through  this incision into the abdominal cavity under direct vision. Sites were then  chosen on the right side of the abdomen for placement of 5 mm ports and each  of these areas was infiltrated with 0.25% Marcaine and small stab incisions  were made a 15 blade knife. 5 mm ports were then placed bluntly through  these incisions into the abdominal cavity under direct vision. A blunt  grasper was placed through the lateral most 5 mm port and used to grasp the  dome of the gallbladder and elevate it anteriorly and superiorly and another  blunt grasper was placed through the other 5 mm port and used to retract the  body and neck of the gallbladder. A dissector was  placed through the upper  midline port and using the electrocautery, the peritoneal reflection  overlying the gallbladder neck area was opened, blunt dissection was then  carried out in this area until the gallbladder neck cystic duct junction was  readily identified and a good window was created. Care was taken to keep the  common duct medial to this dissection. Once the cystic duct was dissected  circumferentially and the window was identified, three clips were placed  proximally and one distally on the cystic duct and the duct was divided  between the two. Posterior to this, the cystic artery was identified and  again dissected bluntly in a circumferential manner until a good window was  created. Two clips were placed proximally and one distally on the artery and  the artery was divided between the two. Next, the  hook cautery device was  used to separate the gallbladder from the liver bed. Prior to completely  detaching the gallbladder from the liver bed, the liver bed was inspected  and several small bleeding  points were coagulated with electrocautery. The  gallbladder was then detached the rest of the way from the liver bed without  difficulty. There were some adhesions noted from the liver to the diaphragm  superiorly. The laparoscope was then moved to the upper midline port and a  gallbladder  grasper was placed through the Hasson cannula. The gallbladder  was grasped and removed through the inferior umbilical port with the Hasson  cannula. The fascial defect was then closed under direct vision with the  previously placed Vicryl pursestring stitch. The abdomen was then irrigated  with copious amounts of saline until affluent was clear. The ports were then  removed under direct vision and were found to be hemostatic and the gas was  allowed to escape. The incisions were all closed with interrupted 4-0  Monocryl subcuticular stitches. Benzoin and Steri-Strips were applied. The  patient tolerated the procedure well. At the end of the case, all sponge,  needle and instrument counts were correct. The patient was awakened and  taken to the recovery room in stable condition.                                               Ollen Gross. Vernell Morgans, M.D.    PST/MEDQ  D:  08/21/2002  T:  08/21/2002  Job:  [id number]

## 2011-04-21 NOTE — Assessment & Plan Note (Signed)
[facility]                           GASTROENTEROLOGY OFFICE NOTE   NAME:FRAZIER, MAURISA TESMER                      MRN:          [id number]  DATE:08/20/2006                            DOB:          Aug 03, 1961    HISTORY OF PRESENT ILLNESS:  Mrs. Bascom Levels is a very nice 50 year old white  female who we saw in the past for irritable bowel syndrome and  gastroesophageal reflux disease.  She has tried various proton pump  inhibitor, but has not been able to refill her Nexium because of her  insurance restrictions.  She has been on Aciphex 40 mg daily, but feels that  at times needs two a day.  She has been having discomfort on the left side  of the abdomen, slightly to the back.  It bothers her mostly during the day  as she walks around.  It is not associated with bowel movements  which have  been regular recently as a result of her taking Activa yogurt and eating  more fiber.  Her last colon exam was in October 2000.  This was actually an  extended flexible sigmoidoscopy by Dr. Corinda Gubler.  He was able to get only to  hepatic flexure or mid ascending colon but did not see to the cecum.   FAMILY HISTORY:  She has a positive family history of peptic ulcer disease  in her father who has been our patient.  No family history for bone cancer.   PHYSICAL EXAMINATION:  VITAL SIGNS:  Blood pressure 112/78, pulse 60, weight  206 pounds.  GENERAL:  She was mildly overweight, in no distress.  LUNGS:  Clear to auscultation.  COR:  Normal S1, S2.  ABDOMEN:  Obese, but very soft.  Minimal tenderness over the lateral aspect  of her trunk, more so in the right lower quadrant reaching almost to the  cecum.  No fullness to the cecum.  Bowel sounds were normoactive.  There was  some tenderness extended into the right costovertebral angle and the spine.   IMPRESSION:  5. A 50 year old black female with vague, right sided abdominal/back      discomfort.  This could be  musculoskeletal.  Incomplete colonoscopy in      the past to mid ascending colon.  2. Gastroesophageal reflux disease.   PLAN:  1. Samples of Aciphex given.  She may need to take two a day at times.  2. Patient wanted to wave the colonoscopy.  I think she should have a full      colonoscopy      within a year to truly see her cecum which was not visualized on the      last exam.  3. Continue high fiber diet and probiotic's.                                   Hedwig Morton. Juanda Chance, MD   DMB/MedQ  DD:  08/20/2006  DT:  08/21/2006  Job #:  [id number]   cc:   Maryelizabeth Rowan, M.D.

## 2011-04-21 NOTE — Op Note (Signed)
Nicole Mata, Nicole Mata               ACCOUNT NO.:  [id number]   MEDICAL RECORD NO.:  [id number]          PATIENT TYPE:  AMB   LOCATION:  DSC                          FACILITY:  [facility]   PHYSICIAN:  Lubertha Basque. Dalldorf, M.D.DATE OF BIRTH:  11/18/1961   DATE OF PROCEDURE:  09/13/2004  DATE OF DISCHARGE:                                 OPERATIVE REPORT   PREOPERATIVE DIAGNOSIS:  Right shoulder impingement.   POSTOPERATIVE DIAGNOSES:  1.  Right shoulder impingement.  2.  Right shoulder rotator cuff tear.   PROCEDURES:  1.  Right shoulder arthroscopic acromioplasty.  2.  Right shoulder arthroscopic partial claviculectomy.  3.  Right shoulder arthroscopic rotator cuff repair.   ANESTHESIA:  General.   ATTENDING SURGEON:  Lubertha Basque. Nicole Mata, M.D.   ASSISTANT:  Lindwood Qua, P.A.   INDICATION FOR PROCEDURE:  The patient is a 50 year old woman with a very  long history of right shoulder pain.  This has persisted despite multiple  injections and activity restrictions and an exercise program.  She has also  had some oral anti-inflammatories.  She persists with pain with rest and  pain with activities and is offered an arthroscopy.  She is status post a  successful arthroscopy on the opposite shoulder last year, where she had a  partial rotator cuff tear.  Informed operative consent was obtained after a  discussion of the possible complications of, reaction to anesthesia, and  infection.   DESCRIPTION OF PROCEDURE:  The patient was taken to the operating suite,  where a general anesthetic was applied without difficulty.  She was also  given a block in the preanesthesia area.  She was positioned in a beach  chair position and prepped and draped in a normal sterile fashion.  After  the administration of preop IV antibiotics, an arthroscopy of the right  shoulder was performed through a total of four portals.  The glenohumeral  joint showed no degenerative changes, and the biceps tendon  appeared intact.  She did seem to have a rotator cuff tear at the supraspinatus attachment  point on the greater tuberosity.  In the subacromial space she had an  unfavorable subacromial morphology, addressed with an acromioplasty back to  a flat surface.  This was done with the bur in the lateral position,  followed by transfer of the bur to the posterior position.  She also had a  large bone spur under the distal clavicle, which appeared to be impinging on  the rotator cuff, and this too was removed without a formal AC  decompression.  We then completed a bleeding bed of bone in the area of her  rotator cuff tear, which appeared to be about 2 cm in length.  I placed two  of the absorbable suture anchors by Arthrex and passed all four emanating  sutures through the rotator cuff.  The cuff was reapproximated to this  bleeding bed of bone with these sutures tied in simple fashion  arthroscopically.  This seemed to get a nice, tight repair.  The shoulder  was thoroughly irrigated, followed by the placement of some Marcaine.  Simple sutures of nylon were used to loosely reapproximate the portals,  followed by Adaptic and a dry gauze dressing with tape.  Estimated blood  loss and intraoperative fluids can be obtained from anesthesia records.   DISPOSITION:  The patient was extubated in the operating room and taken to  the recovery room in stable condition.  Plans were for her to go home the  same day and follow up in the office in less than a week.  I will contact  her by phone tonight.       PGD/MEDQ  D:  09/13/2004  T:  09/13/2004  Job:  [id number]

## 2011-05-14 ENCOUNTER — Encounter: Payer: Self-pay | Admitting: Family Medicine

## 2011-05-18 ENCOUNTER — Ambulatory Visit (INDEPENDENT_AMBULATORY_CARE_PROVIDER_SITE_OTHER): Payer: BC Managed Care – PPO | Admitting: Family Medicine

## 2011-05-18 ENCOUNTER — Encounter: Payer: Self-pay | Admitting: Family Medicine

## 2011-05-18 VITALS — BP 122/81 | HR 80 | Ht 66.0 in | Wt 222.0 lb

## 2011-05-18 DIAGNOSIS — Z0000 Encounter for general adult medical examination without abnormal findings: Secondary | ICD-10-CM

## 2011-05-18 LAB — LIPID PANEL
Cholesterol: 151 mg/dL (ref 0–200)
HDL: 42 mg/dL (ref >39)

## 2011-05-18 NOTE — Patient Instructions (Signed)
Start a regular exercise program and make sure you are eating a healthy diet Try to eat 4 servings of dairy a day or take a calcium supplement (500mg twice a day). Your vaccines are up to date.   

## 2011-05-18 NOTE — Progress Notes (Signed)
  Subjective:     Nicole Mata is a 50 y.o. female and is here for a comprehensive physical exam. The patient reports no problems.  History   Social History  . Marital Status: Divorced    Spouse Name: N/A    Number of Children: 0  . Years of Education: N/A   Occupational History  . Customer Service.     Social History Main Topics  . Smoking status: Never Smoker   . Smokeless tobacco: Not on file  . Alcohol Use: No  . Drug Use: No  . Sexually Active:      divorced, no kids, customer service, 2 caffeine drinks daily.   Other Topics Concern  . Not on file   Social History Narrative   1 can of soda a day.  No regular exercise.    Health Maintenance  Topic Date Due  . Pap Smear  01/24/2014  . Tetanus/tdap  12/04/2018    The following portions of the patient's history were reviewed and updated as appropriate: allergies, current medications, past family history, past medical history, past social history and past surgical history.  Review of Systems A comprehensive review of systems was negative.   Objective:    BP 122/81  Pulse 80  Ht 5\' 6"  (1.676 m)  Wt 222 lb (100.699 kg)  BMI 35.83 kg/m2 General appearance: alert, cooperative, appears stated age and mildly obese Head: Normocephalic, without obvious abnormality, atraumatic Eyes: conjuncitiva clear, PEERLA, EOMi.  Ears: normal TM's and external ear canals both ears Nose: Nares normal. Septum midline. Mucosa normal. No drainage or sinus tenderness. Throat: lips, mucosa, and tongue normal; teeth and gums normal Neck: no adenopathy, no carotid bruit, supple, symmetrical, trachea midline and thyroid not enlarged, symmetric, no tenderness/mass/nodules Back: symmetric, no curvature. ROM normal. No CVA tenderness. Lungs: clear to auscultation bilaterally Heart: regular rate and rhythm, S1, S2 normal, no murmur, click, rub or gallop Abdomen: soft, non-tender; bowel sounds normal; no masses,  no organomegaly Extremities:  extremities normal, atraumatic, no cyanosis or edema Pulses: 2+ and symmetric Skin: Skin color, texture, turgor normal. No rashes or lesions Lymph nodes: Cervical, supraclavicular, and axillary nodes normal. Neurologic: Grossly normal    Assessment:    Healthy female exam.   Plan:     See After Visit Summary for Counseling Recommendations   Start a regular exercise program and make sure you are eating a healthy diet Try to eat 4 servings of dairy a day or take a calcium supplement (500mg  twice a day). Your vaccines are up to date.   Due for screening labwork.

## 2011-05-19 ENCOUNTER — Telehealth: Payer: Self-pay | Admitting: Family Medicine

## 2011-05-19 LAB — COMPLETE METABOLIC PANEL WITH GFR
Albumin: 3.9 g/dL (ref 3.5–5.2)
BUN: 13 mg/dL (ref 6–23)
CO2: 26 mEq/L (ref 19–32)
GFR, Est African American: >60 mL/min (ref >60)
GFR, Est Non African American: >60 mL/min (ref >60)
Glucose, Bld: 101 mg/dL — ABNORMAL HIGH (ref 70–99)
Sodium: 138 mEq/L (ref 135–145)
Total Bilirubin: 0.3 mg/dL (ref 0.3–1.2)
Total Protein: 6.9 g/dL (ref 6.0–8.3)

## 2011-05-19 NOTE — Telephone Encounter (Signed)
Call patient: Cholesterol looks fantastic. Complete metabolic panel is normal except for her blood sugar is borderline at 101. Normal is under 100. Recheck sugar in 6 months.

## 2011-05-22 NOTE — Telephone Encounter (Signed)
Advised pt of test results.

## 2011-06-27 ENCOUNTER — Ambulatory Visit: Payer: BC Managed Care – PPO | Admitting: Family Medicine

## 2011-06-28 ENCOUNTER — Ambulatory Visit (INDEPENDENT_AMBULATORY_CARE_PROVIDER_SITE_OTHER): Payer: BC Managed Care – PPO | Admitting: Family Medicine

## 2011-06-28 ENCOUNTER — Other Ambulatory Visit: Payer: Self-pay | Admitting: Family Medicine

## 2011-06-28 ENCOUNTER — Encounter: Payer: Self-pay | Admitting: Family Medicine

## 2011-06-28 ENCOUNTER — Other Ambulatory Visit: Payer: Self-pay | Admitting: Internal Medicine

## 2011-06-28 DIAGNOSIS — R1011 Right upper quadrant pain: Secondary | ICD-10-CM

## 2011-06-28 DIAGNOSIS — M549 Dorsalgia, unspecified: Secondary | ICD-10-CM

## 2011-06-28 DIAGNOSIS — R0789 Other chest pain: Secondary | ICD-10-CM

## 2011-06-28 MED ORDER — CYCLOBENZAPRINE HCL 10 MG PO TABS: 10.0000 mg | ORAL_TABLET | Freq: Every evening | ORAL | Status: DC | PRN

## 2011-06-28 MED ORDER — DEXLANSOPRAZOLE 60 MG PO CPDR: 60.0000 mg | DELAYED_RELEASE_CAPSULE | Freq: Every day | ORAL | Status: DC

## 2011-06-28 MED ORDER — MELOXICAM 15 MG PO TABS: 15.0000 mg | ORAL_TABLET | Freq: Every day | ORAL | Status: DC

## 2011-06-28 NOTE — Telephone Encounter (Signed)
rx sent

## 2011-06-28 NOTE — Progress Notes (Signed)
Subjective:    Patient ID: Nicole Mata, female    DOB: 1961/03/29, 50 y.o.   MRN: [id number]  Back Pain This is a recurrent problem. Episode onset: 2 years. The problem occurs intermittently. The problem is unchanged. The pain is present in the thoracic spine and lumbar spine. The quality of the pain is described as aching. Radiates to: rnow pain on the right side and radiating around tto her RUQ. The pain is at a severity of 5/10. Exacerbated by: nothing.   Shoulder Pain   Has had shoulder pain on and off but worse on Sunday aand onw her arm feel slike occ numbn. Did try to get a massage late night - didn't help. Hasn't taken any meds for her pain relief. Used to take mobic and ran out about 3 months ago. The pain is radiating into the upper back and into her left chest.  Most of her discomfrot if over her deltoid. NO SOB or diaphoresis. Hx of shoulder surgery for spurs.  Says occ will wake up with her arm numb. She is a side sleeper. Can be either arm.    Has a new pain on the right sides that is radiates to the RUQ. Hx of cholecystemy.  No nausea. No changes with eating.  No prior hx of kidney stones. Pain comes and goes during the day.     Review of Systems  Musculoskeletal: Positive for back pain.   BP 125/84  Pulse 94  Ht 5\' 6"  (1.676 m)  Wt 223 lb (101.152 kg)  BMI 35.99 kg/m2    Allergies  Allergen Reactions  . Penicillins   . Sulfa Drugs Cross Reactors     Past Medical History  Diagnosis Date  . Hiatal hernia   . IBS (irritable bowel syndrome)   . GERD (gastroesophageal reflux disease)   . Gastritis     helibactor pylori    Past Surgical History  Procedure Date  . Shoulder surgery 2002, 2003    bilateral for bone spurs and torn RC  . Cholecystectomy 2001    History   Social History  . Marital Status: Divorced    Spouse Name: N/A    Number of Children: 0  . Years of Education: N/A   Occupational History  . Customer Service.     Social History Main  Topics  . Smoking status: Never Smoker   . Smokeless tobacco: Not on file  . Alcohol Use: No  . Drug Use: No  . Sexually Active:      divorced, no kids, customer service, 2 caffeine drinks daily.   Other Topics Concern  . Not on file   Social History Narrative   1 can of soda a day.  No regular exercise.     Family History  Problem Relation Age of Onset  . Heart disease      family disease  . Heart attack Father 50    grandparents  . Hypertension Father   . Hyperlipidemia Father   . Diabetes Maternal Grandmother   . Diabetes Maternal Uncle   . Ovarian cancer Maternal Aunt   . Hypertension Mother   . Hyperlipidemia Mother   . Diabetes Brother     Ms. Nicole Mata does not currently have medications on file.     Objective:   Physical Exam  Constitutional: She is oriented to person, place, and time. She appears well-developed and well-nourished.  HENT:  Head: Normocephalic and atraumatic.  Neck: Neck supple. No thyromegaly present.  Cardiovascular:  Normal rate, regular rhythm and normal heart sounds.   Pulmonary/Chest: Effort normal and breath sounds normal.  Abdominal: Soft. Bowel sounds are normal. She exhibits no distension and no mass. There is no tenderness. There is no rebound and no guarding.  Musculoskeletal: Normal range of motion.       UE adn LE with NROM. Low back with NROM.  No pain with movement.  UE and LE strength 5/5 bilat.    Lymphadenopathy:    She has no cervical adenopathy.  Neurological: She is alert and oriented to person, place, and time.  Skin: Skin is warm and dry.  Psychiatric: She has a normal mood and affect.          Assessment & Plan:  Atpyical Chest pain - may be radiating from her shoulder but has family hx of premature heart dz.  EKG shows rate of 84 bpm, NSR, lead on V6 is flat which means likely not attached properly. Will send to lab for cardiac enzymes, etc. Will get cbc to rule out infection as well.  Back pain - Will restart  her diclofenac and add a muscle relaxer at bedtime and see if feels better. Given H>O on stretches for lumvar and thoracic spine. She sees a Chiropodist once a month and gets a massage once a month.  She is not interested in Pt at this time. Has done this in the past and felt it really wasn't that helpful.    Right side pain radiating into RUQ today- prior hx cholecystectomy. Will check liver enzymes. Consider renal stones but no prior hx and pain is not severe.

## 2011-06-28 NOTE — Patient Instructions (Signed)
Call if not getting better over the next 3 weeks.

## 2011-06-29 ENCOUNTER — Telehealth: Payer: Self-pay | Admitting: Family Medicine

## 2011-06-29 LAB — CBC WITH DIFFERENTIAL/PLATELET
Basophils Absolute: 0 K/uL (ref 0.0–0.1)
Basophils Relative: 0 % (ref 0–1)
Eosinophils Absolute: 0.3 K/uL (ref 0.0–0.7)
Eosinophils Relative: 3 % (ref 0–5)
HCT: 40.3 % (ref 36.0–46.0)
Hemoglobin: 12.9 g/dL (ref 12.0–15.0)
Lymphocytes Relative: 26 % (ref 12–46)
Lymphs Abs: 2.4 K/uL (ref 0.7–4.0)
MCH: 30 pg (ref 26.0–34.0)
MCHC: 32 g/dL (ref 30.0–36.0)
MCV: 93.7 fL (ref 78.0–100.0)
Monocytes Absolute: 0.8 K/uL (ref 0.1–1.0)
Monocytes Relative: 9 % (ref 3–12)
Neutro Abs: 5.7 K/uL (ref 1.7–7.7)
Neutrophils Relative %: 62 % (ref 43–77)
Platelets: 347 K/uL (ref 150–400)
RBC: 4.3 MIL/uL (ref 3.87–5.11)
RDW: 14.3 % (ref 11.5–15.5)
WBC: 9.2 K/uL (ref 4.0–10.5)

## 2011-06-29 LAB — COMPLETE METABOLIC PANEL WITH GFR
ALT: 55 U/L — ABNORMAL HIGH (ref 0–35)
AST: 57 U/L — ABNORMAL HIGH (ref 0–37)
Creat: 0.75 mg/dL (ref 0.50–1.10)
Total Bilirubin: 0.5 mg/dL (ref 0.3–1.2)

## 2011-06-29 LAB — CK TOTAL AND CKMB (NOT AT ARMC): Total CK: 64 U/L (ref 7–177)

## 2011-06-29 LAB — TROPONIN I: Troponin I: <0.01 ng/mL

## 2011-06-29 NOTE — Telephone Encounter (Signed)
Left message on vm with resutls and called and added on labs

## 2011-06-29 NOTE — Telephone Encounter (Signed)
Call pt: CBC is OK.  Cardiac enzymes are nl.  Liver enzymes are elevated.  Call lab and add an acute hep panel. Avoid all alcohol and tylenol and recheck liver in 2-3 weeks.

## 2011-06-30 LAB — HEPATITIS PANEL, ACUTE
Hep B C IgM: NEGATIVE
Hepatitis B Surface Ag: NEGATIVE

## 2011-07-03 ENCOUNTER — Telehealth: Payer: Self-pay | Admitting: Family Medicine

## 2011-07-03 DIAGNOSIS — R748 Abnormal levels of other serum enzymes: Secondary | ICD-10-CM

## 2011-07-03 NOTE — Telephone Encounter (Signed)
Call patient: Acute hepatitis panel is negative. I still want to recheck her liver enzymes in about 2-3 weeks. I will go ahead and put in an order for this.

## 2011-07-03 NOTE — Telephone Encounter (Signed)
Pt called wanting the add-on test results from last week. Plan:  Reviewed the pt chart and according to what is showing resulted test appear okay, but told the pt that she should get another call to give a specific breakdown of exactly what the provider wants her to know once she has reviewed the test. Jarvis Newcomer, LPN [facility]

## 2011-07-04 NOTE — Telephone Encounter (Signed)
Pt.notified

## 2011-07-05 ENCOUNTER — Encounter: Payer: Self-pay | Admitting: Family Medicine

## 2011-07-26 ENCOUNTER — Encounter: Payer: Self-pay | Admitting: Internal Medicine

## 2011-07-26 ENCOUNTER — Ambulatory Visit (INDEPENDENT_AMBULATORY_CARE_PROVIDER_SITE_OTHER): Payer: BC Managed Care – PPO | Admitting: Internal Medicine

## 2011-07-26 VITALS — BP 110/72 | HR 64 | Ht 67.0 in | Wt 233.0 lb

## 2011-07-26 DIAGNOSIS — R7401 Elevation of levels of liver transaminase levels: Secondary | ICD-10-CM

## 2011-07-26 DIAGNOSIS — K219 Gastro-esophageal reflux disease without esophagitis: Secondary | ICD-10-CM

## 2011-07-26 MED ORDER — DEXLANSOPRAZOLE 60 MG PO CPDR: 60.0000 mg | DELAYED_RELEASE_CAPSULE | Freq: Every day | ORAL | Status: DC

## 2011-07-26 NOTE — Progress Notes (Signed)
Nicole Mata 1961-06-23 MRN [id number]        History of Present Illness:  This is a 50 year old white female with history of gastroesophageal reflux, 4 cm hiatal hernia and history of H. pylori gastritis. She comes for a refill of Dexilant.. she has no complaints other than the right upper quadrant abdominal discomfort. Last  endoscopy in June 2010 showed 4 cm hiatal hernia. Prior endoscopy 2003 showed H. pyloric gastritis which was treated with triple therapy.. She is status post remote laparoscopic cholecystectomy. CT scan of the abdomen in January 2010 and showed mild diverticulosis.  Last  colonoscopy of October 2008 was normal.  She was told to have  abnormal liver function tests., But our records indicate that she had normal liver function tests in 2000 and 2001. She is  Having then repeated by Dr Nicole Mata next week.    Past Medical History  Diagnosis Date  . Hiatal hernia   . IBS (irritable bowel syndrome)   . GERD (gastroesophageal reflux disease)   . Helicobacter pylori gastritis   . Gastropathy     reactive  . Diverticulosis    Past Surgical History  Procedure Date  . Shoulder surgery 2002, 2003    bilateral for bone spurs and torn RC  . Cholecystectomy 2001    reports that she has never smoked. She has never used smokeless tobacco. She reports that she does not drink alcohol or use illicit drugs. family history includes Diabetes in her brother, maternal grandmother, and maternal uncle; Heart attack (age of onset:50) in her father; Heart disease in an unspecified family member; Hyperlipidemia in her father and mother; Hypertension in her father and mother; and Ovarian cancer in her maternal aunt.  There is no history of Colon cancer. Allergies  Allergen Reactions  . Penicillins   . Sulfa Drugs Cross Reactors         Review of Systems: Admits to occasional heartburn. Denies dysphagia or odynophagia, denies change in bowel habits  The remainder of the 10  point  ROS is negative except as outlined in H&P   Physical Exam: General appearance  Well developed in no distress, overweight Eyes- non icteric HEENT nontraumatic, normocephalic Mouth no lesions, tongue papillated, no cheilosis Neck supple without adenopathy, thyroid not enlarged, no carotid bruits, no JVD Lungs Clear to auscultation bilaterally Cor normal S1 normal S2, regular rhythm , no murmur,  quiet precordium Abdomen large protuberant abdomen with normal active bowel sounds. No tenderness. Liver edge at costal margin. I could not reproduce her pain Rectal: Not done Extremities no pedal edema Skin no lesions Neurological alert and oriented x3 Psychological normal mood and  affect  Assessment and Plan:  Problems #1 gastroesophageal reflux and H. pylori gastritis. Under good control. We will refill her medications  Problem #2 abnormal liver function tests. They will be repeated by  DR Nicole Mata,, she may need upper abdominal ultrasound to rule out fatty liver. Her liver function tests were normal in 2000 and 2001 according to our records. A right upper quadrant abdominal discomfort may be related to mild hepatomegaly or fatty liver. I asked her again to try to lose weight   07/26/2011 Nicole Mata

## 2011-07-26 NOTE — Progress Notes (Signed)
Addended by: Richardson Chiquito on: 07/26/2011 08:52 AM   Modules accepted: Orders

## 2011-07-26 NOTE — Patient Instructions (Signed)
Dr Linford Arnold,

## 2011-08-03 ENCOUNTER — Telehealth: Payer: Self-pay | Admitting: *Deleted

## 2011-08-03 DIAGNOSIS — R748 Abnormal levels of other serum enzymes: Secondary | ICD-10-CM

## 2011-08-03 NOTE — Telephone Encounter (Signed)
Lab order

## 2011-08-04 ENCOUNTER — Telehealth: Payer: Self-pay | Admitting: Family Medicine

## 2011-08-04 DIAGNOSIS — R748 Abnormal levels of other serum enzymes: Secondary | ICD-10-CM

## 2011-08-04 LAB — HEPATIC FUNCTION PANEL
ALT: 54 U/L — ABNORMAL HIGH (ref 0–35)
AST: 46 U/L — ABNORMAL HIGH (ref 0–37)
Albumin: 4 g/dL (ref 3.5–5.2)
Alkaline Phosphatase: 104 U/L (ref 39–117)
Total Protein: 7 g/dL (ref 6.0–8.3)

## 2011-08-04 NOTE — Telephone Encounter (Signed)
[place] entered.

## 2011-08-04 NOTE — Telephone Encounter (Signed)
Call pt: Liver enzymes are about the same. Didn't go down. i wouldlike to schedule her for liver US to see if she may have fatty liver.  IF ok let me know and I will schedule. I

## 2011-08-04 NOTE — Telephone Encounter (Signed)
Pt notified and wants to go ahead and schedule

## 2011-08-09 ENCOUNTER — Ambulatory Visit
Admission: RE | Admit: 2011-08-09 | Discharge: 2011-08-09 | Disposition: A | Discharge status: Home / Self Care | Payer: BC Managed Care – PPO | Source: Ambulatory Visit | Attending: Family Medicine | Admitting: Family Medicine

## 2011-08-09 ENCOUNTER — Telehealth: Payer: Self-pay | Admitting: Family Medicine

## 2011-08-09 ENCOUNTER — Inpatient Hospital Stay (INDEPENDENT_AMBULATORY_CARE_PROVIDER_SITE_OTHER)
Admission: RE | Admit: 2011-08-09 | Discharge: 2011-08-09 | Disposition: A | Discharge status: Home / Self Care | Payer: BC Managed Care – PPO | Source: Ambulatory Visit | Attending: Emergency Medicine | Admitting: Emergency Medicine

## 2011-08-09 ENCOUNTER — Inpatient Hospital Stay: Admission: RE | Admit: 2011-08-09 | Payer: BC Managed Care – PPO | Source: Ambulatory Visit

## 2011-08-09 ENCOUNTER — Encounter: Payer: Self-pay | Admitting: Emergency Medicine

## 2011-08-09 DIAGNOSIS — J069 Acute upper respiratory infection, unspecified: Secondary | ICD-10-CM

## 2011-08-09 DIAGNOSIS — R748 Abnormal levels of other serum enzymes: Secondary | ICD-10-CM

## 2011-08-09 DIAGNOSIS — R05 Cough: Secondary | ICD-10-CM

## 2011-08-09 DIAGNOSIS — R059 Cough, unspecified: Secondary | ICD-10-CM

## 2011-08-09 NOTE — Telephone Encounter (Signed)
Called and phone hung up   

## 2011-08-09 NOTE — Telephone Encounter (Signed)
Called patient: Ultrasound shows that she does have a fatty liver. This is the most likely cause of her elevated liver enzymes. The recommendation is a low-fat diet in addition to exercise and weight loss. This will also help decrease the fat in the liver and typically the enzymes were returned to normal. I like her to work on these things and we can recheck her enzymes in about 4 months.

## 2011-08-10 NOTE — Telephone Encounter (Signed)
Pt.notified

## 2011-08-16 ENCOUNTER — Ambulatory Visit (INDEPENDENT_AMBULATORY_CARE_PROVIDER_SITE_OTHER): Payer: BC Managed Care – PPO | Admitting: Family Medicine

## 2011-08-16 ENCOUNTER — Encounter: Payer: Self-pay | Admitting: Family Medicine

## 2011-08-16 DIAGNOSIS — J329 Chronic sinusitis, unspecified: Secondary | ICD-10-CM

## 2011-08-16 MED ORDER — DOXYCYCLINE HYCLATE 100 MG PO CAPS: 100.0000 mg | ORAL_CAPSULE | Freq: Two times a day (BID) | ORAL | Status: AC

## 2011-08-16 NOTE — Progress Notes (Signed)
  Subjective:    Patient ID: Nicole Mata, female    DOB: 07/01/1961, 50 y.o.   MRN: [id number]  HPI Sxs started about 3 weeks ago.  Having cough and chest tightness. Cough was more dry. Then last week developed sinus congstion and facial pressure.  No ST. No fever. Went to UC last week and completed a 7 days course of Bactrim.  Also took OTc cough med. She says she doesn't feel any better but she doesn't feeling worse. She did say the first 2 days of antibiotics she actually felt slightly better. Still no fever. Cough is still mostly dry. She is using an over-the-counter cough medicine as well. She says she wakes up some at night but it's not too disturbing.   Review of Systems     Objective:   Physical Exam  Constitutional: She is oriented to person, place, and time. She appears well-developed and well-nourished.  HENT:  Head: Normocephalic and atraumatic.  Right Ear: External ear normal.  Left Ear: External ear normal.  Nose: Nose normal.  Mouth/Throat: Oropharynx is clear and moist.       TMs and canals are clear.   Eyes: Conjunctivae and EOM are normal. Pupils are equal, round, and reactive to light.  Neck: Neck supple. No thyromegaly present.  Cardiovascular: Normal rate, regular rhythm and normal heart sounds.   Pulmonary/Chest: Effort normal and breath sounds normal. She has no wheezes.  Lymphadenopathy:    She has no cervical adenopathy.  Neurological: She is alert and oriented to person, place, and time.  Skin: Skin is warm and dry.  Psychiatric: She has a normal mood and affect.          Assessment & Plan:  Sinusitis - Restart your allergy med. Can take zyrtec or claritin. as I do think a component of this is allergic rhinitis Will start doxy. She says she doesn't feel well on zpack.  Call if not better in one week.

## 2011-08-24 ENCOUNTER — Ambulatory Visit
Admission: RE | Admit: 2011-08-24 | Discharge: 2011-08-24 | Disposition: A | Discharge status: Home / Self Care | Payer: BC Managed Care – PPO | Source: Ambulatory Visit | Attending: Family Medicine | Admitting: Family Medicine

## 2011-08-24 ENCOUNTER — Telehealth: Payer: Self-pay | Admitting: Family Medicine

## 2011-08-24 DIAGNOSIS — R059 Cough, unspecified: Secondary | ICD-10-CM

## 2011-08-24 DIAGNOSIS — R05 Cough: Secondary | ICD-10-CM

## 2011-08-24 NOTE — Telephone Encounter (Signed)
Pt called and was recently seen she said, and given a 2nd round of Ab tx (Doxy for 10 days).  Has 3 days left.  Still C/O cough that is worsening and no better.  Using Sharl Ma tussin during the day but not helping or working.  Did not get a Rx cough med when she was seen our office because she declined at that time.  Pt afebrile.  C/O nasal and chest congestion and says that the chest is getting tight at times.  Coughing up infectious sputum.  Please advise. Plan:  Routed encounter to the provider for review. Jarvis Newcomer, LPN [facility]

## 2011-08-24 NOTE — Telephone Encounter (Signed)
Pt informed CXR ordered and to go today or tomorrow. Jarvis Newcomer, LPN [facility]

## 2011-08-24 NOTE — Telephone Encounter (Signed)
If she is not getting better then she needs a cxr.  Order placed. She can go for it today or tomorrow.

## 2011-08-25 ENCOUNTER — Telehealth: Payer: Self-pay | Admitting: *Deleted

## 2011-08-25 ENCOUNTER — Telehealth: Payer: Self-pay | Admitting: Family Medicine

## 2011-08-25 MED ORDER — ALBUTEROL SULFATE HFA 108 (90 BASE) MCG/ACT IN AERS: 2.0000 | INHALATION_SPRAY | Freq: Four times a day (QID) | RESPIRATORY_TRACT | Status: DC | PRN

## 2011-08-25 NOTE — Telephone Encounter (Signed)
Message copied by Gifford Shave on Fri Aug 25, 2011 10:18 AM ------      Message from: Nani Gasser D      Created: Thu Aug 24, 2011  9:18 PM       CXR is normal. No bronchitis or pneumonia. I can calling an inhaler and see if it helps with the tightness adn sputum, If ok can calling Proair 2 puffs every 4 hours prn.  That also means likely viral so may take another week to get better.

## 2011-08-25 NOTE — Telephone Encounter (Signed)
Pt notified of their CXR result.  Pt wishes to get the proair MDI, and the script was sent for the provider. Jarvis Newcomer, LPN [facility]

## 2011-08-25 NOTE — Telephone Encounter (Signed)
Message copied by Gifford Shave on Fri Aug 25, 2011 10:20 AM ------      Message from: Nani Gasser D      Created: Thu Aug 24, 2011  9:18 PM       CXR is normal. No bronchitis or pneumonia. I can calling an inhaler and see if it helps with the tightness adn sputum, If ok can calling Proair 2 puffs every 4 hours prn.  That also means likely viral so may take another week to get better.

## 2011-08-25 NOTE — Telephone Encounter (Signed)
Called and left message  On vm

## 2011-08-25 NOTE — Telephone Encounter (Signed)
Message copied by Wyline Beady on Fri Aug 25, 2011  1:16 PM ------      Message from: Nani Gasser D      Created: Thu Aug 24, 2011  9:18 PM       CXR is normal. No bronchitis or pneumonia. I can calling an inhaler and see if it helps with the tightness adn sputum, If ok can calling Proair 2 puffs every 4 hours prn.  That also means likely viral so may take another week to get better.

## 2011-09-08 LAB — DIFFERENTIAL
Basophils Absolute: 0.1 10*3/uL (ref 0.0–0.1)
Basophils Relative: 1 % (ref 0–1)
Eosinophils Absolute: 0.1 10*3/uL (ref 0.0–0.7)
Eosinophils Relative: 1 % (ref 0–5)
Monocytes Absolute: 0.7 10*3/uL (ref 0.1–1.0)
Monocytes Relative: 7 % (ref 3–12)

## 2011-09-08 LAB — URINALYSIS, ROUTINE W REFLEX MICROSCOPIC
Bilirubin Urine: NEGATIVE
Glucose, UA: NEGATIVE mg/dL
Hgb urine dipstick: NEGATIVE
Protein, ur: NEGATIVE mg/dL
Specific Gravity, Urine: 1.023 (ref 1.005–1.030)
Urobilinogen, UA: 0.2 mg/dL (ref 0.0–1.0)

## 2011-09-08 LAB — BASIC METABOLIC PANEL
CO2: 29 mEq/L (ref 19–32)
Calcium: 9.4 mg/dL (ref 8.4–10.5)
Chloride: 103 mEq/L (ref 96–112)
GFR calc Af Amer: >60 mL/min (ref >60)
Glucose, Bld: 118 mg/dL — ABNORMAL HIGH (ref 70–99)
Sodium: 142 mEq/L (ref 135–145)

## 2011-09-08 LAB — CBC
Hemoglobin: 13 g/dL (ref 12.0–15.0)
MCHC: 33.9 g/dL (ref 30.0–36.0)
MCV: 91 fL (ref 78.0–100.0)
RBC: 4.21 MIL/uL (ref 3.87–5.11)
RDW: 12.9 % (ref 11.5–15.5)

## 2011-09-14 ENCOUNTER — Inpatient Hospital Stay (INDEPENDENT_AMBULATORY_CARE_PROVIDER_SITE_OTHER)
Admission: RE | Admit: 2011-09-14 | Discharge: 2011-09-14 | Disposition: A | Discharge status: Home / Self Care | Payer: BC Managed Care – PPO | Source: Ambulatory Visit | Attending: Emergency Medicine | Admitting: Emergency Medicine

## 2011-09-14 ENCOUNTER — Encounter: Payer: Self-pay | Admitting: Emergency Medicine

## 2011-09-14 DIAGNOSIS — J0100 Acute maxillary sinusitis, unspecified: Secondary | ICD-10-CM | POA: Insufficient documentation

## 2011-09-14 DIAGNOSIS — J209 Acute bronchitis, unspecified: Secondary | ICD-10-CM

## 2011-09-15 ENCOUNTER — Encounter: Payer: Self-pay | Admitting: Family Medicine

## 2011-09-28 ENCOUNTER — Telehealth (HOSPITAL_BASED_OUTPATIENT_CLINIC_OR_DEPARTMENT_OTHER): Payer: Self-pay | Admitting: Emergency Medicine

## 2011-10-30 ENCOUNTER — Other Ambulatory Visit: Payer: Self-pay | Admitting: Obstetrics & Gynecology

## 2011-10-30 ENCOUNTER — Other Ambulatory Visit: Payer: Self-pay | Admitting: Family Medicine

## 2011-10-30 ENCOUNTER — Other Ambulatory Visit: Payer: Self-pay | Admitting: Obstetrics and Gynecology

## 2011-10-30 DIAGNOSIS — Z1231 Encounter for screening mammogram for malignant neoplasm of breast: Secondary | ICD-10-CM

## 2011-11-06 NOTE — Progress Notes (Signed)
Summary: Upper URI (rm 5)   Vital Signs:  Patient Profile:   50 Years Old Female CC:      cough, sinus pain, ear pain x 2 months Height:     68 inches Weight:      221 pounds O2 Sat:      100 % O2 treatment:    Room Air Temp:     99.1 degrees F oral Pulse rate:   95 / minute Resp:     14 per minute BP sitting:   122 / 82  (left arm) Cuff size:   regular  Vitals Entered By: Lajean Saver RN (September 14, 2011 4:19 PM)                  Updated Prior Medication List: DEXILANT 60 MG CPDR (DEXLANSOPRAZOLE) Take one by mouth every morning 30 minutes before breakfast. VENTOLIN HFA 108 (90 BASE) MCG/ACT AERS (ALBUTEROL SULFATE) prn  Current Allergies (reviewed today): ! * IVP DYE PCNHistory of Present Illness History from: patient Chief Complaint: cough, sinus pain, ear pain x 2 months History of Present Illness: Pt complains of 6 days of head and chest congestion.  No sore throat. + productive  cough, Rare dyspnea. + L ear pain without drainage. No chest pain. No wheezing.  No nausea No vomiting. + fever, + chills . She states that 2 months ago, she had URI/bronchitis sxs, Dr Linford Arnold rx'd "7 day abx", didn't improve, then rx'd with "10 day abx" which helped some. CXR was negative. Was rx'd Ventolin HFA also then which helped as needed cough then. Had improved until new sxs started 6 days ago.  Pt requests " a 10 day abx". She states that she cannot tolerate prednisone or steroids of any kind, and she would not be open to trying any variant of prednisone.  REVIEW OF SYSTEMS Constitutional Symptoms       Complains of chills.     Denies fever, night sweats, weight loss, weight gain, and fatigue.  Eyes       Denies change in vision, eye pain, eye discharge, glasses, contact lenses, and eye surgery. Ear/Nose/Throat/Mouth       Complains of ear pain, frequent runny nose, and sinus problems.      Denies hearing loss/aids, change in hearing, ear discharge, dizziness, frequent  nose bleeds, sore throat, hoarseness, and tooth pain or bleeding.  Respiratory       Complains of dry cough.      Denies productive cough, wheezing, shortness of breath, asthma, bronchitis, and emphysema/COPD.  Cardiovascular       Complains of chest pain.      Denies murmurs and tires easily with exhertion.      Comments: pain with cough   Gastrointestinal       Denies stomach pain, nausea/vomiting, diarrhea, constipation, blood in bowel movements, and indigestion. Genitourniary       Denies painful urination, blood or discharge from vagina, kidney stones, and loss of urinary control. Neurological       Complains of tingling.      Denies paralysis, seizures, and fainting/blackouts.      Comments: "tingling all over" Musculoskeletal       Denies muscle pain, joint pain, joint stiffness, decreased range of motion, redness, swelling, muscle weakness, and gout.  Skin       Denies bruising, unusual mles/lumps or sores, and hair/skin or nail changes.  Psych       Denies mood changes, temper/anger issues, anxiety/stress, speech problems,  depression, and sleep problems. Other Comments: Patient's symptoms started 2 months ago. Seen by Dr. Orson Aloe and Dr. Linford Arnold, taken 2 different antibiotics. Started to improve, then symptoms returned. Dr. Linford Arnold did an x-ray on 08/24/11 which was normal   Past History:  Past Medical History: Reviewed history from 05/24/2009 and no changes required. Current Problems:  RUQ PAIN (ICD-789.01) Hx of HIATAL HERNIA (ICD-553.3) IRRITABLE BOWEL SYNDROME (ICD-564.1) GERD (ICD-530.81) HELICOBACTER PYLORI GASTRITIS, HX OF (ICD-V12.79)  Past Surgical History: Reviewed history from 01/16/2011 and no changes required. Shoulder surgery-bilateral for bone spurs and torn RC.  cholecystectomy  Family History: Reviewed history from 01/16/2011 and no changes required. No FH of Colon Cancer: Family History of Heart Disease: Father MI in his mid-73s,  Grandparents Family History of Diabetes: MGM, Mat Uncle Family History of Ovarian Cancer: Mat. Aunt x 2 HTN: Mom and Dad Cholesterol : Mom and DAd  Social History: Reviewed history from 12/21/2008 and no changes required. Divorced, no kids [employer] Illicit Drug Use - no Patient has never smoked.  Alcohol Use - no Daily Caffeine Use Dr. Reino Kent x 2 Physical Exam General appearance: fatigued, NAD, alert Head: normocephalic, atraumatic,+ minimal maxillary sinus ttp Eyes: conjunctivae and lids normal Ears: normal, no lesions or deformities. TM's wnl Nasal: swollen red turbinates with congestion Oral/Pharynx: tongue normal, posterior pharynx without erythema or exudate Neck: neck supple,  trachea midline, no masses Chest/Lungs: scattered rhonchi. No wheezes or rales. 02 sat 100% on RA. Heart: regular rate and  rhythm, no murmur Extremities: normal extremities Skin: no obvious rashes or lesions MSE: oriented to time, place, and person Assessment New Problems: ACUTE BRONCHITIS (ICD-466.0) ACUTE MAXILLARY SINUSITIS (ICD-461.0)   Patient Education: Patient and/or caregiver instructed in the following: rest fluids and Tylenol.  Plan New Medications/Changes: CLARITHROMYCIN 500 MG TABS (CLARITHROMYCIN) One Tab by mouth two times a day for 10 days  #20 x 0, 09/14/2011, Lajean Manes MD  New Orders: Est. Patient Level IV [44010] Planning Comments:   Tx options discussed. otc sxs care discussed. Clarithromycin rx'd. May use ventolin hfa as needed. She declined any new rx for cough med. Decongestant methods discussed. F/U with Dr. Linford Arnold in 2 weeks if no better , sonner if worse or new sxs. Risks, benefits, alternatives discussed. Pt voiced understanding and agreement with tx plan.   The patient and/or caregiver has been counseled thoroughly with regard to medications prescribed including dosage, schedule, interactions, rationale for use, and possible side effects and they  verbalize understanding.  Diagnoses and expected course of recovery discussed and will return if not improved as expected or if the condition worsens. Patient and/or caregiver verbalized understanding.  Prescriptions: CLARITHROMYCIN 500 MG TABS (CLARITHROMYCIN) One Tab by mouth two times a day for 10 days  #20 x 0   Entered and Authorized by:   Lajean Manes MD   Signed by:   Lajean Manes MD on 09/14/2011   Method used:   Print then Give to Patient   RxID:   [id number]   Orders Added: 1)  Est. Patient Level IV [95638]

## 2011-11-06 NOTE — Progress Notes (Signed)
Summary: cough,sore thorat Room 5   Vital Signs:  Patient Profile:   50 Years Old Female CC:      Cough 2-3 weeks, ears hurt Height:     68 inches Weight:      222 pounds O2 Sat:      98 % O2 treatment:    Room Air Temp:     98.7 degrees F oral Pulse rate:   97 / minute Pulse rhythm:   regular Resp:     16 per minute BP sitting:   125 / 76  (left arm) Cuff size:   regular  Vitals Entered By: Emilio Math (August 09, 2011 10:07 AM)                  Current Allergies (reviewed today): ! * IVP DYE PCNHistory of Present Illness Chief Complaint: Cough 2-3 weeks, ears hurt History of Present Illness: 50 Years Old Female complains of onset of cold symptoms for 2 wks.  [place]ry has been using Robitussin which is helping a little bit. No sore throat + dry cough + hoarse No pleuritic pain No wheezing No nasal congestion No post-nasal drainage + sinus pain/pressure No chest congestion + earache No hemoptysis No SOB No chills/sweats No fever No nausea No vomiting No abdominal pain No diarrhea No skin rashes No fatigue No myalgias No headache   REVIEW OF SYSTEMS Constitutional Symptoms      Denies fever, chills, night sweats, weight loss, weight gain, and fatigue.  Eyes       Denies change in vision, eye pain, eye discharge, glasses, contact lenses, and eye surgery. Ear/Nose/Throat/Mouth       Complains of ear pain, sinus problems, sore throat, and hoarseness.      Denies hearing loss/aids, change in hearing, ear discharge, dizziness, frequent runny nose, frequent nose bleeds, and tooth pain or bleeding.  Respiratory       Complains of dry cough.      Denies productive cough, wheezing, shortness of breath, asthma, bronchitis, and emphysema/COPD.  Cardiovascular       Denies murmurs, chest pain, and tires easily with exhertion.    Gastrointestinal       Denies stomach pain, nausea/vomiting, diarrhea, constipation, blood in bowel movements, and  indigestion. Genitourniary       Denies painful urination, kidney stones, and loss of urinary control. Neurological       Denies paralysis, seizures, and fainting/blackouts. Musculoskeletal       Denies muscle pain, joint pain, joint stiffness, decreased range of motion, redness, swelling, muscle weakness, and gout.  Skin       Denies bruising, unusual mles/lumps or sores, and hair/skin or nail changes.  Psych       Denies mood changes, temper/anger issues, anxiety/stress, speech problems, depression, and sleep problems.  Past History:  Past Medical History: Reviewed history from 05/24/2009 and no changes required. Current Problems:  RUQ PAIN (ICD-789.01) Hx of HIATAL HERNIA (ICD-553.3) IRRITABLE BOWEL SYNDROME (ICD-564.1) GERD (ICD-530.81) HELICOBACTER PYLORI GASTRITIS, HX OF (ICD-V12.79)  Past Surgical History: Reviewed history from 01/16/2011 and no changes required. Shoulder surgery-bilateral for bone spurs and torn RC.  cholecystectomy  Family History: Reviewed history from 01/16/2011 and no changes required. No FH of Colon Cancer: Family History of Heart Disease: Father MI in his mid-66s, Grandparents Family History of Diabetes: MGM, Mat Uncle Family History of Ovarian Cancer: Mat. Aunt x 2 HTN: Mom and Dad Cholesterol : Mom and DAd  Social History: Reviewed history from 12/21/2008  and no changes required. Divorced, no kids [employer] Illicit Drug Use - no Patient has never smoked.  Alcohol Use - no Daily Caffeine Use Dr. Reino Kent x 2 Physical Exam General appearance: well developed, well nourished, no acute distress Ears: mild clear fluid behind TMs bil, no erythema Nasal: mild clear discharge Oral/Pharynx: clear PND, no erythema or exudate Chest/Lungs: no rales, wheezes, or rhonchi bilateral, breath sounds equal without effort Heart: regular rate and  rhythm, no murmur MSE: oriented to time, place, and person Assessment New Problems: COUGH  (ICD-786.2) UPPER RESPIRATORY INFECTION, ACUTE (ICD-465.9)   Plan New Medications/Changes: TESSALON 200 MG CAPS (BENZONATATE) 1 by mouth three times a day as needed for cough  #21 x 0, 08/09/2011, Hoyt Koch MD BACTRIM DS 800-160 MG TABS (SULFAMETHOXAZOLE-TRIMETHOPRIM) 1 by mouth two times a day for 7 days  #14 x 0, 08/09/2011, Hoyt Koch MD  New Orders: Pulse Oximetry (single measurment) [id number] New Patient Level III [60454] Planning Comments:   1)  Take the prescribed antibiotic as instructed.   2)  Use nasal saline solution (over the counter) at least 3 times a day. 3)  Use over the counter decongestants like Zyrtec-D every 12 hours as needed to help with congestion. 4)  Can take tylenol every 6 hours or motrin every 8 hours for pain or fever. 5)  Follow up with your primary doctor  if no improvement in 5-7 days, sooner if increasing pain, fever, or new symptoms.    The patient and/or caregiver has been counseled thoroughly with regard to medications prescribed including dosage, schedule, interactions, rationale for use, and possible side effects and they verbalize understanding.  Diagnoses and expected course of recovery discussed and will return if not improved as expected or if the condition worsens. Patient and/or caregiver verbalized understanding.  Prescriptions: TESSALON 200 MG CAPS (BENZONATATE) 1 by mouth three times a day as needed for cough  #21 x 0   Entered and Authorized by:   Hoyt Koch MD   Signed by:   Hoyt Koch MD on 08/09/2011   Method used:   Electronically to        [facility] [address] (retail)       [address]       [place], [place]  [address]       Ph: [phone] or [phone]       Fax: [phone]   RxID:   [id number] BACTRIM DS 800-160 MG TABS (SULFAMETHOXAZOLE-TRIMETHOPRIM) 1 by mouth two times a day for 7 days  #14 x 0   Entered and Authorized by:   Hoyt Koch MD   Signed by:    Hoyt Koch MD on 08/09/2011   Method used:   Electronically to        [address] (retail)       [address]       [place], [place]  [address]       Ph: [phone] or [phone]       Fax: [phone]   RxID:   [id number]   Orders Added: 1)  Pulse Oximetry (single measurment) [94760] 2)  New Patient Level III [70623]

## 2011-11-16 ENCOUNTER — Telehealth: Payer: Self-pay | Admitting: *Deleted

## 2011-11-16 DIAGNOSIS — R748 Abnormal levels of other serum enzymes: Secondary | ICD-10-CM

## 2011-11-24 ENCOUNTER — Ambulatory Visit (HOSPITAL_BASED_OUTPATIENT_CLINIC_OR_DEPARTMENT_OTHER)
Admission: RE | Admit: 2011-11-24 | Discharge: 2011-11-24 | Disposition: A | Discharge status: Home / Self Care | Payer: BC Managed Care – PPO | Source: Ambulatory Visit | Attending: Obstetrics & Gynecology | Admitting: Obstetrics & Gynecology

## 2011-11-24 ENCOUNTER — Ambulatory Visit (INDEPENDENT_AMBULATORY_CARE_PROVIDER_SITE_OTHER): Payer: BC Managed Care – PPO | Admitting: Family Medicine

## 2011-11-24 DIAGNOSIS — Z23 Encounter for immunization: Secondary | ICD-10-CM

## 2011-11-24 DIAGNOSIS — Z1231 Encounter for screening mammogram for malignant neoplasm of breast: Secondary | ICD-10-CM | POA: Insufficient documentation

## 2011-11-24 IMAGING — MG MM DIGITAL SCREENING BILAT
4 series · 4 of 4 positions shown · non-contrast
Comparison: none

DG SCREEN MAMMOGRAM BILATERAL
Bilateral CC and MLO view(s) were taken.
Technologist: TAKI, RT (R)(M)

DIGITAL SCREENING MAMMOGRAM WITH CAD:
There are scattered fibroglandular densities.  No masses or malignant type calcifications are 
identified.  Compared with prior studies.
Images were processed with CAD.

[R CC]
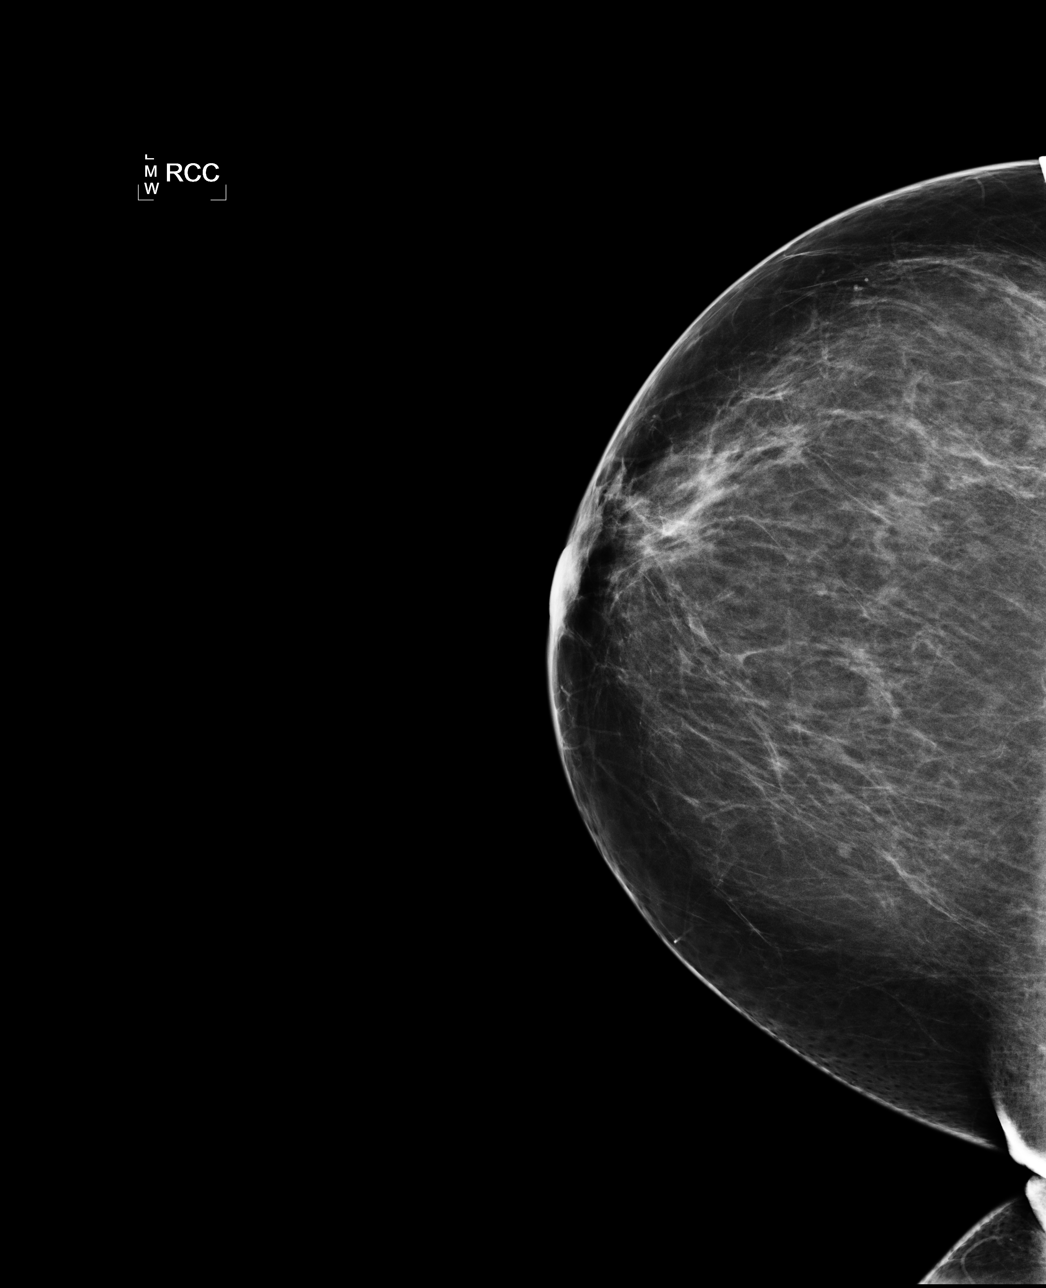

[L CC]
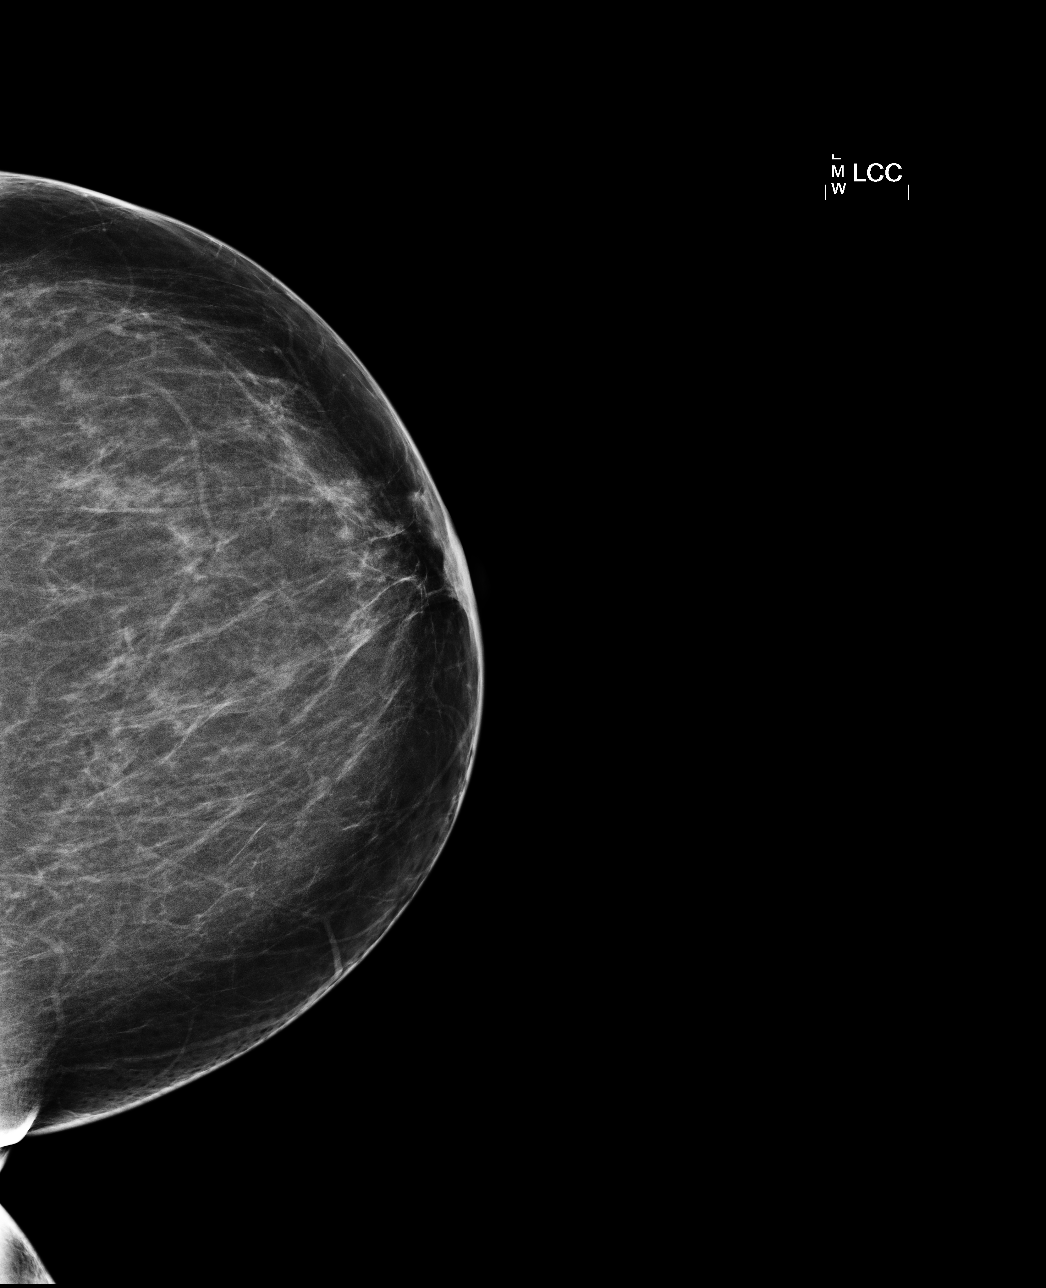

[L MLO]
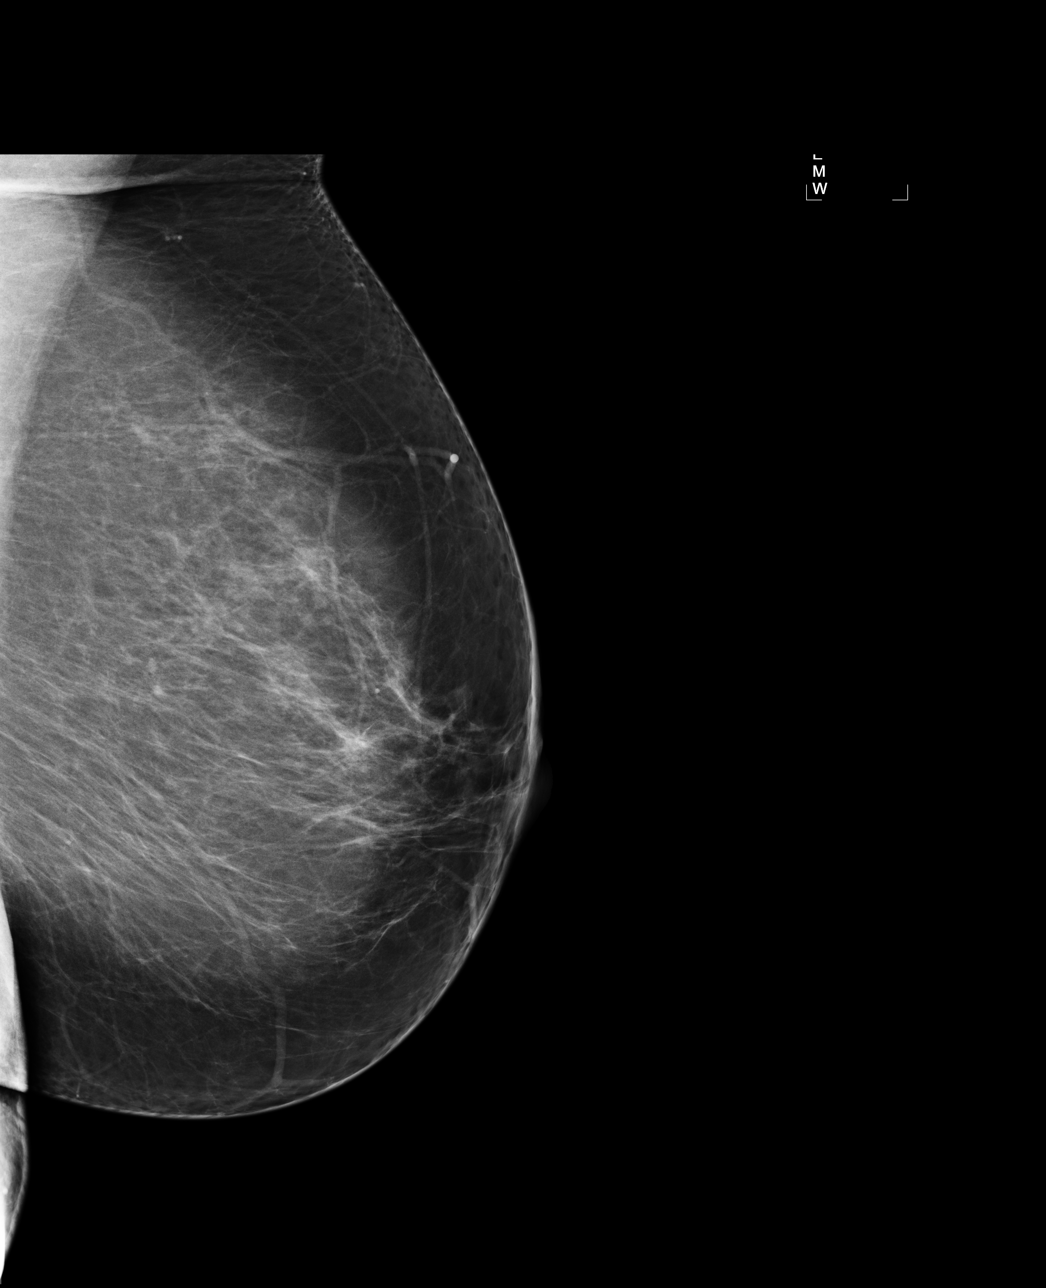

[R MLO]
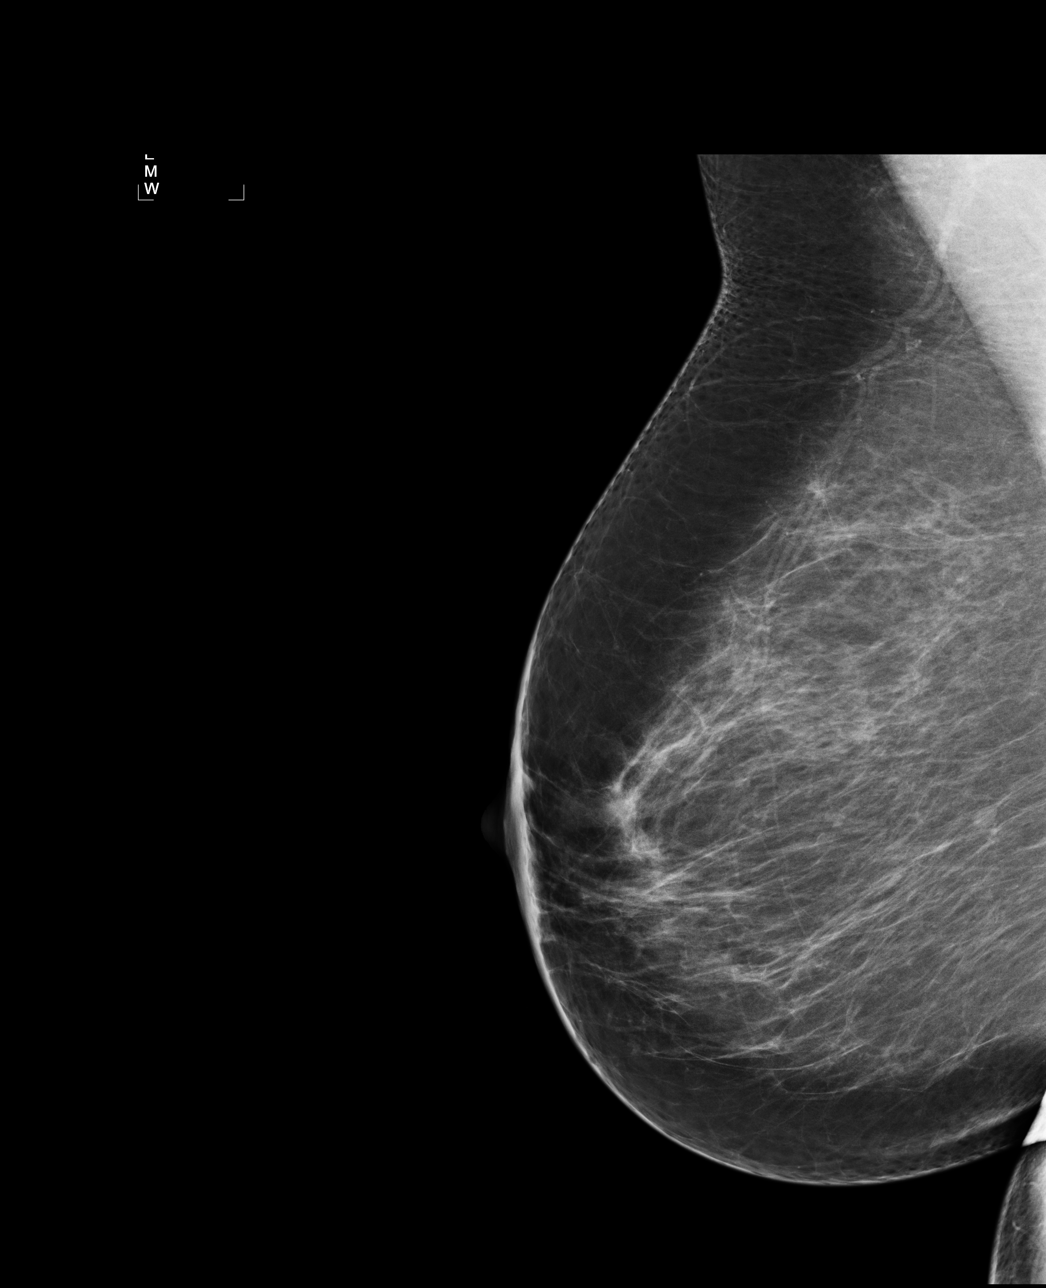

[4 of 4 positions shown; findings below may reference images not displayed]

IMPRESSION: No specific mammographic evidence of malignancy.  Next screening mammogram is recommended in one 
year.

A result letter of this screening mammogram will be mailed directly to the patient.

ASSESSMENT: Negative - BI-RADS 1

Screening mammogram in 1 year.
,

## 2011-11-24 NOTE — Progress Notes (Signed)
Patient ID: Nicole Mata, female   DOB: [birth year], 50 y.o.   MRN: [id number] Here for flu shot

## 2011-11-25 LAB — COMPLETE METABOLIC PANEL WITH GFR
ALT: 34 U/L (ref 0–35)
AST: 32 U/L (ref 0–37)
Alkaline Phosphatase: 77 U/L (ref 39–117)
BUN: 10 mg/dL (ref 6–23)
Calcium: 9 mg/dL (ref 8.4–10.5)
Creat: 0.68 mg/dL (ref 0.50–1.10)
Total Bilirubin: 0.6 mg/dL (ref 0.3–1.2)

## 2011-12-25 ENCOUNTER — Ambulatory Visit: Payer: BC Managed Care – PPO

## 2012-01-31 ENCOUNTER — Ambulatory Visit: Payer: BC Managed Care – PPO | Admitting: Obstetrics & Gynecology

## 2012-01-31 ENCOUNTER — Encounter: Payer: BC Managed Care – PPO | Admitting: Family Medicine

## 2012-02-06 ENCOUNTER — Encounter: Payer: Self-pay | Admitting: Family Medicine

## 2012-02-06 ENCOUNTER — Ambulatory Visit (INDEPENDENT_AMBULATORY_CARE_PROVIDER_SITE_OTHER): Payer: BC Managed Care – PPO | Admitting: Family Medicine

## 2012-02-06 VITALS — BP 109/77 | HR 69 | Ht 68.0 in | Wt 226.0 lb

## 2012-02-06 DIAGNOSIS — E669 Obesity, unspecified: Secondary | ICD-10-CM | POA: Insufficient documentation

## 2012-02-06 DIAGNOSIS — Z0000 Encounter for general adult medical examination without abnormal findings: Secondary | ICD-10-CM

## 2012-02-06 LAB — COMPLETE METABOLIC PANEL WITH GFR
Albumin: 4.1 g/dL (ref 3.5–5.2)
Alkaline Phosphatase: 77 U/L (ref 39–117)
CO2: 25 mEq/L (ref 19–32)
Calcium: 9.5 mg/dL (ref 8.4–10.5)
Chloride: 104 mEq/L (ref 96–112)
GFR, Est Non African American: >89 mL/min
Glucose, Bld: 97 mg/dL (ref 70–99)
Potassium: 4.9 mEq/L (ref 3.5–5.3)
Sodium: 140 mEq/L (ref 135–145)
Total Protein: 7.2 g/dL (ref 6.0–8.3)

## 2012-02-06 NOTE — Progress Notes (Signed)
Subjective:     Nicole Mata is a 51 y.o. female and is here for a comprehensive physical exam. The patient reports no problems.  History   Social History  . Marital Status: Divorced    Spouse Name: N/A    Number of Children: 0  . Years of Education: N/A   Occupational History  . Customer Service.     Social History Main Topics  . Smoking status: Never Smoker   . Smokeless tobacco: Never Used  . Alcohol Use: No  . Drug Use: No  . Sexually Active: Not Currently     divorced, no kids, customer service, 2 caffeine drinks daily.   Other Topics Concern  . Not on file   Social History Narrative   1 can of soda a day.  No regular exercise.    Health Maintenance  Topic Date Due  . Influenza Vaccine  09/03/2012  . Pap Smear  01/24/2014  . Tetanus/tdap  12/04/2018    The following portions of the patient's history were reviewed and updated as appropriate: allergies, current medications, past family history, past medical history, past social history, past surgical history and problem list.  Review of Systems A comprehensive review of systems was negative.   Objective:    BP 109/77  Pulse 69  Ht 5\' 8"  (1.727 m)  Wt 226 lb (102.513 kg)  BMI 34.36 kg/m2  BP 109/77  Pulse 69  Ht 5\' 8"  (1.727 m)  Wt 226 lb (102.513 kg)  BMI 34.36 kg/m2  General Appearance:    Alert, cooperative, no distress, appears stated age  Head:    Normocephalic, without obvious abnormality, atraumatic  Eyes:    PERRL, conjunctiva/corneas clear, EOM's intact both eyes  Ears:    Normal TM's and external ear canals, both ears  Nose:   Nares normal, septum midline, mucosa normal, no drainage    or sinus tenderness  Throat:   Lips, mucosa, and tongue normal; teeth and gums normal  Neck:   Supple, symmetrical, trachea midline, no adenopathy;    thyroid:  no enlargement/tenderness/nodules; no carotid   bruit or JVD  Back:     Symmetric, no curvature, ROM normal, no CVA tenderness  Lungs:     Clear to  auscultation bilaterally, respirations unlabored  Chest Wall:    No tenderness or deformity   Heart:    Regular rate and rhythm, S1 and S2 normal, no murmur, rub   or gallop  Breast Exam:    No tenderness, masses, or nipple abnormality  Abdomen:     Soft, non-tender, bowel sounds active all four quadrants,    no masses, no organomegaly  Genitalia:    Not performed. Has gyn appt next month.   Rectal:    Not performed.   Extremities:   Extremities normal, atraumatic, no cyanosis or edema  Pulses:   2+ and symmetric all extremities  Skin:   Skin color, texture, turgor normal, no rashes or lesions  Lymph nodes:   Cervical, supraclavicular, and axillary nodes normal  Neurologic:   CNII-XII intact, normal and reflexes   throughout      Assessment:    Healthy female exam.      Plan:     See After Visit Summary for Counseling Recommendations  Start a regular exercise program and make sure you are eating a healthy diet Try to eat 4 servings of dairy a day or take a calcium supplement (500mg  twice a day). Your vaccines are up to date.  Cholesterol was normal in June, 2012.

## 2012-02-06 NOTE — Patient Instructions (Signed)
Start a regular exercise program and make sure you are eating a healthy diet Try to eat 4 servings of dairy a day or take a calcium supplement (500mg twice a day). Your vaccines are up to date.  We will call you with your lab results. If you don't here from us in about a week then please give us a call at [phone].  

## 2012-02-07 ENCOUNTER — Encounter: Payer: Self-pay | Admitting: Family Medicine

## 2012-02-07 DIAGNOSIS — E8881 Metabolic syndrome: Secondary | ICD-10-CM | POA: Insufficient documentation

## 2012-02-14 ENCOUNTER — Encounter: Payer: Self-pay | Admitting: Obstetrics & Gynecology

## 2012-02-14 ENCOUNTER — Ambulatory Visit (INDEPENDENT_AMBULATORY_CARE_PROVIDER_SITE_OTHER): Payer: BC Managed Care – PPO | Admitting: Obstetrics & Gynecology

## 2012-02-14 ENCOUNTER — Ambulatory Visit: Payer: BC Managed Care – PPO | Admitting: Obstetrics & Gynecology

## 2012-02-14 VITALS — BP 126/81 | HR 91 | Temp 97.0°F | Ht 68.0 in | Wt 228.1 lb

## 2012-02-14 DIAGNOSIS — Z01419 Encounter for gynecological examination (general) (routine) without abnormal findings: Secondary | ICD-10-CM

## 2012-02-14 DIAGNOSIS — N951 Menopausal and female climacteric states: Secondary | ICD-10-CM

## 2012-02-14 MED ORDER — NORETHINDRONE ACET-ETHINYL EST 1-20 MG-MCG PO TABS: 1.0000 | ORAL_TABLET | Freq: Every day | ORAL | Status: DC

## 2012-02-14 MED ORDER — ESTRADIOL 0.05 MG/24HR TD PTTW: MEDICATED_PATCH | TRANSDERMAL | Status: DC

## 2012-02-14 MED ORDER — MEDROXYPROGESTERONE ACETATE 10 MG PO TABS: 10.0000 mg | ORAL_TABLET | Freq: Every day | ORAL | Status: DC

## 2012-02-14 NOTE — Progress Notes (Signed)
  Subjective:    Nicole Mata is a 51 y.o. female who presents for an annual exam. The patient has no complaints today. The patient is sexually active. GYN screening history: last pap: was normal, last mammogram:  Was normalThe patient wears seatbelts: yes. The patient participates in regular exercise: no. Has the patient ever been transfused or tattooed?: no. The patient reports that there is not domestic violence in her life.   Menstrual History: OB History    Grav Para Term Preterm Abortions TAB SAB Ect Mult Living   1    1  1          LMP Pt has monthly periods that last 2 days.  She does not have spotting between periods.  The following portions of the patient's history were reviewed and updated as appropriate: allergies, current medications, past family history, past medical history, past social history, past surgical history and problem list.  Review of Systems A comprehensive review of systems was negative.  Except for hot lashes, not sleeping at night, irritability, and vaginal burning and dryness   Objective:  Marland Kitchen   Vitals:  WNL General appearance: alert, cooperative and no distress Head: Normocephalic, without obvious abnormality, atraumatic Eyes: negative Throat: lips, mucosa, and tongue normal; teeth and gums normal Lungs: clear to auscultation bilaterally Breasts: normal appearance, no masses or tenderness, No nipple retraction or dimpling, No nipple discharge or bleeding Heart: regular rate and rhythm Abdomen: soft, non-tender; bowel sounds normal; no masses,  no organomegaly Pelvic: cervix normal in appearance, external genitalia normal, no adnexal masses or tenderness but exam limited by habitus, no bladder tenderness, no cervical motion tenderness, perianal skin: no external genital warts noted, rectovaginal septum normal, urethra without abnormality or discharge, uterus is nontender but limited exam due to habitus, and vagina slightly  atrophic Extremities: no edema,  redness or tenderness in the calves or thighs Skin: no lesions or rash Lymph nodes: Axillary adenopathy: none    Assessment:    Healthy female exam.  Menopausal symptoms   Plan:    Pt in low risk category for cervical cancer.  No abnml paps or history of Leep or Cryo. Pt had reported no vaginal bleeding to me, but reported mense to RN.  Pt called after visit to clarify and she is having monthly menses.  Pt had been given sample of vivelle dot which she should discard and take the OCPs she is prescribed. Start lo dose OCP for menopausal symptoms and also prevention against pregnancy.  Pt still has monthly cycles although short.   All questions answered. Follow up in 6 months.

## 2012-02-21 ENCOUNTER — Telehealth: Payer: Self-pay | Admitting: *Deleted

## 2012-02-21 NOTE — Telephone Encounter (Signed)
Pt notified that she should not take the Vivelle Dot but should take her birth control pills that Dr Penne Lash had prescribed.

## 2012-02-27 ENCOUNTER — Encounter: Payer: BC Managed Care – PPO | Admitting: Obstetrics & Gynecology

## 2012-03-12 ENCOUNTER — Ambulatory Visit (INDEPENDENT_AMBULATORY_CARE_PROVIDER_SITE_OTHER): Payer: BC Managed Care – PPO | Admitting: Family Medicine

## 2012-03-12 ENCOUNTER — Encounter: Payer: Self-pay | Admitting: Family Medicine

## 2012-03-12 ENCOUNTER — Other Ambulatory Visit: Payer: Self-pay | Admitting: Family Medicine

## 2012-03-12 VITALS — BP 126/70 | HR 92 | Temp 98.4°F | Ht 68.0 in | Wt 229.0 lb

## 2012-03-12 DIAGNOSIS — M79609 Pain in unspecified limb: Secondary | ICD-10-CM

## 2012-03-12 DIAGNOSIS — M542 Cervicalgia: Secondary | ICD-10-CM

## 2012-03-12 DIAGNOSIS — R209 Unspecified disturbances of skin sensation: Secondary | ICD-10-CM

## 2012-03-12 DIAGNOSIS — R0789 Other chest pain: Secondary | ICD-10-CM

## 2012-03-12 DIAGNOSIS — M546 Pain in thoracic spine: Secondary | ICD-10-CM

## 2012-03-12 DIAGNOSIS — R200 Anesthesia of skin: Secondary | ICD-10-CM

## 2012-03-12 DIAGNOSIS — M79603 Pain in arm, unspecified: Secondary | ICD-10-CM

## 2012-03-12 DIAGNOSIS — M79606 Pain in leg, unspecified: Secondary | ICD-10-CM

## 2012-03-12 NOTE — Progress Notes (Signed)
Subjective:    Patient ID: Erasmo Leventhal, female    DOB: [birth year], 51 y.o.   MRN: [id number]  HPI Last week got a severe pressure HA with chest tightness and tingling all over.  She says it lasted for several minutes. The headache lasted longer. She was not sure of her blood pressure was high or not. She does not have a home blood pressure cuff. Then happened again a couple of days later.  She did have a cardiac stress test about 4 years ago that was normal.   Has a lot of tension in her upper back and neck as well.  Will get sharp chest pain on the left side of chest and over the sternal area. It often radiates under the left axilla and into her back, especially on the left. that is intermittant. Can happen at rest or while doing something.  She is taking dexilant and has changed her diet.    Also Will wake up in the middl eof the night and her right arm will go numb. Xrays form 2/12 showed degen changes at C6-7.  Couldn't do PT but has been going to a chiropractor once a month for almost a year. Was getting some relief with massage therapy as wel but very temporary.  Mobic does seem to help, but not relieve the numbness and tingling. She also has some aching into the left shoulder down to the elbow in her left arm. No numbness and tingling on the left elbow or hand.   Review of Systems  BP 126/70  Pulse 92  Temp(Src) 98.4 F (36.9 C) (Oral)  Ht 5\' 8"  (1.727 m)  Wt 229 lb (103.874 kg)  BMI 34.82 kg/m2  SpO2 99%  LMP 01/29/2012    Allergies  Allergen Reactions  . Penicillins Swelling    Throat swelling  . Sulfa Drugs Cross Reactors Swelling    Throat swelling  . Contrast Media (Iodinated Diagnostic Agents) Hives    Past Medical History  Diagnosis Date  . Hiatal hernia   . IBS (irritable bowel syndrome)   . GERD (gastroesophageal reflux disease)   . Helicobacter pylori gastritis   . Gastropathy     reactive  . Diverticulosis     Past Surgical History  Procedure Date   . Shoulder surgery 2002, 2003    bilateral for bone spurs and torn RC  . Cholecystectomy 2001    History   Social History  . Marital Status: Divorced    Spouse Name: N/A    Number of Children: 0  . Years of Education: N/A   Occupational History  . Customer Service.     Social History Main Topics  . Smoking status: Never Smoker   . Smokeless tobacco: Never Used  . Alcohol Use: No  . Drug Use: No  . Sexually Active: Yes     divorced, no kids, customer service, 2 caffeine drinks daily.   Other Topics Concern  . Not on file   Social History Narrative   1 can of soda a day.  No regular exercise.     Family History  Problem Relation Age of Onset  . Heart disease      family disease  . Heart attack Father 50    grandparents  . Hypertension Father   . Hyperlipidemia Father   . Diabetes Maternal Grandmother   . Diabetes Maternal Uncle   . Ovarian cancer Maternal Aunt     x 2  . Hypertension Mother   .  Hyperlipidemia Mother   . Diabetes Brother   . Colon cancer Neg Hx   . Ovarian cancer Maternal Aunt   . Diabetes type II Mother     Outpatient Encounter Prescriptions as of 03/12/2012  Medication Sig Dispense Refill  . cyclobenzaprine (FLEXERIL) 10 MG tablet Take 1 tablet (10 mg total) by mouth at bedtime as needed for muscle spasms.  30 tablet  1  . dexlansoprazole (DEXILANT) 60 MG capsule Take 1 capsule (60 mg total) by mouth daily.  30 capsule  10  . meloxicam (MOBIC) 15 MG tablet Take 1 tablet (15 mg total) by mouth daily.  90 tablet  1  . norethindrone-ethinyl estradiol (JUNEL 1/20) 1-20 MG-MCG tablet Take 1 tablet by mouth daily.  1 Package  11  . albuterol (PROAIR HFA) 108 (90 BASE) MCG/ACT inhaler Inhale 2 puffs into the lungs every 6 (six) hours as needed.  1 Inhaler  1  . dexlansoprazole (DEXILANT) 60 MG capsule Take 1 capsule (60 mg total) by mouth daily.  30 capsule  2  . fluticasone (FLONASE) 50 MCG/ACT nasal spray Place 2 sprays into the nose daily.          Marland Kitchen DISCONTD: estradiol (VIVELLE-DOT) 0.05 MG/24HR Apply one patch twice a week.  8 patch  6          Objective:   Physical Exam  Constitutional: She is oriented to person, place, and time. She appears well-developed and well-nourished.  HENT:  Head: Normocephalic and atraumatic.  Cardiovascular: Normal rate, regular rhythm and normal heart sounds.        No carotid or abdominal bruits.   Pulmonary/Chest: Effort normal and breath sounds normal.  Abdominal: Soft. Bowel sounds are normal. She exhibits no distension and no mass. There is no tenderness. There is no rebound and no guarding.  Neurological: She is alert and oriented to person, place, and time.  Skin: Skin is warm and dry.  Psychiatric: She has a normal mood and affect. Her behavior is normal.          Assessment & Plan:  Atypical chest pain.- Unlikely to be cardiac. Her EKG shows a rate of 85 beats per minute, normal sinus rhythm, normal axis, normal intervals. She has a Q wave in lead 3. Otherwise no ST-T wave changes. Certainly there are 2 considerations here. One is she does have reflux and hiatal hernia. She is taking her dexilant but some of her symptoms do sound very much like GERD. She sees Dr. Dickie La who is her gastroenterologist. He has been a year since she has been seen.*: I encouraged her to Try to make an appointment with Dr. Dickie La  just to make sure that this is not related to her reflux or hiatal hernia. In the meantime we will work on getting the thoracic MRI as well. He has her symptoms are predominantly on the left and radiate around from her back to her breast area this certainly could be related to her thoracic spine. If that is normal and she is cleared from the gastrologist to consider a stress test.  Neck/thoracic pain - MRI of neck and thoracic spine bc of numbness into right arm but alos pain into the left shoulder. She has been doing conservative therapy including chiropractic care massage therapy for  over a year. Her symptoms have continued to persist and have been getting worse recently. For like to get an MRI to further evaluate if she may potentially need surgery.

## 2012-03-12 NOTE — Patient Instructions (Signed)
Get back with Dr. Dickie La.

## 2012-03-13 LAB — CBC WITH DIFFERENTIAL/PLATELET
Eosinophils Relative: 1 % (ref 0–5)
HCT: 38.1 % (ref 36.0–46.0)
Lymphocytes Relative: 27 % (ref 12–46)
Lymphs Abs: 2.6 10*3/uL (ref 0.7–4.0)
MCH: 29.1 pg (ref 26.0–34.0)
MCV: 93.8 fL (ref 78.0–100.0)
Monocytes Absolute: 0.6 10*3/uL (ref 0.1–1.0)
RBC: 4.06 MIL/uL (ref 3.87–5.11)
RDW: 13.8 % (ref 11.5–15.5)
WBC: 9.8 10*3/uL (ref 4.0–10.5)

## 2012-03-13 LAB — FERRITIN: Ferritin: 48 ng/mL (ref 10–291)

## 2012-03-13 LAB — CK TOTAL AND CKMB (NOT AT ARMC)

## 2012-03-13 LAB — TROPONIN I: Troponin I: <0.01 ng/mL (ref <0.06)

## 2012-03-13 LAB — FOLATE: Folate: >20 ng/mL

## 2012-03-14 ENCOUNTER — Encounter: Payer: Self-pay | Admitting: *Deleted

## 2012-03-19 ENCOUNTER — Other Ambulatory Visit: Payer: BC Managed Care – PPO

## 2012-03-19 ENCOUNTER — Inpatient Hospital Stay: Admission: RE | Admit: 2012-03-19 | Payer: BC Managed Care – PPO | Source: Ambulatory Visit

## 2012-03-21 ENCOUNTER — Telehealth: Payer: Self-pay | Admitting: *Deleted

## 2012-03-21 NOTE — Telephone Encounter (Signed)
Thoracic MRI PA is [id number] and cervical spine MRI is [id number] both without contrast.called and left message at GI with amy [phone]

## 2012-03-27 ENCOUNTER — Ambulatory Visit
Admission: RE | Admit: 2012-03-27 | Discharge: 2012-03-27 | Disposition: A | Discharge status: Home / Self Care | Payer: BC Managed Care – PPO | Source: Ambulatory Visit | Attending: Family Medicine | Admitting: Family Medicine

## 2012-03-27 DIAGNOSIS — M542 Cervicalgia: Secondary | ICD-10-CM

## 2012-03-27 DIAGNOSIS — M79603 Pain in arm, unspecified: Secondary | ICD-10-CM

## 2012-03-27 DIAGNOSIS — M79606 Pain in leg, unspecified: Secondary | ICD-10-CM

## 2012-03-27 DIAGNOSIS — M546 Pain in thoracic spine: Secondary | ICD-10-CM

## 2012-03-27 DIAGNOSIS — R0789 Other chest pain: Secondary | ICD-10-CM

## 2012-03-27 DIAGNOSIS — R200 Anesthesia of skin: Secondary | ICD-10-CM

## 2012-03-28 ENCOUNTER — Other Ambulatory Visit: Payer: Self-pay | Admitting: Family Medicine

## 2012-03-28 DIAGNOSIS — M5126 Other intervertebral disc displacement, lumbar region: Secondary | ICD-10-CM

## 2012-03-28 DIAGNOSIS — M5412 Radiculopathy, cervical region: Secondary | ICD-10-CM

## 2012-04-16 ENCOUNTER — Encounter: Payer: Self-pay | Admitting: Internal Medicine

## 2012-04-16 ENCOUNTER — Ambulatory Visit (INDEPENDENT_AMBULATORY_CARE_PROVIDER_SITE_OTHER): Payer: BC Managed Care – PPO | Admitting: Internal Medicine

## 2012-04-16 VITALS — BP 110/70 | HR 76 | Ht 68.0 in | Wt 229.4 lb

## 2012-04-16 DIAGNOSIS — K219 Gastro-esophageal reflux disease without esophagitis: Secondary | ICD-10-CM

## 2012-04-16 MED ORDER — RANITIDINE HCL 150 MG PO TABS: 150.0000 mg | ORAL_TABLET | Freq: Every day | ORAL | Status: DC

## 2012-04-16 NOTE — Patient Instructions (Addendum)
We have sent the following medications to your pharmacy for you to pick up at your convenience: Ranitadine CC: Dr Linford Arnold

## 2012-04-16 NOTE — Progress Notes (Signed)
Nicole Mata [birth year] MRN [id number]    History of Present Illness:  This is a 51 year old white female with gastroesophageal reflux disease and a 4 cm hiatal hernia documented on a prior upper endoscopy. Her last one was in June 2010. She has a history of H. pylori gastritis on an upper endoscopy in 2003. She has had 2 episodes of nocturnal substernal chest pain which woke her up and responded to [facility]. She otherwise denies dysphagia, daytime heartburn or regurgitation. She has been on Dexilant 60 mg every morning. She has taken Mobic 15 mg once or twice a week for cervical spine DJD. Her weight has increased to 229 pounds. She denies nocturnal cough or hoarseness.   Past Medical History  Diagnosis Date  . Hiatal hernia   . IBS (irritable bowel syndrome)   . GERD (gastroesophageal reflux disease)   . Helicobacter pylori gastritis   . Gastropathy     reactive  . Diverticulosis   . Diabetes mellitus    Past Surgical History  Procedure Date  . Shoulder surgery 2002, 2003    bilateral for bone spurs and torn RC  . Cholecystectomy 2001    reports that she has never smoked. She has never used smokeless tobacco. She reports that she does not drink alcohol or use illicit drugs. family history includes Diabetes in her brother, maternal grandmother, and maternal uncle; Diabetes type II in her mother; Heart attack (age of onset:50) in her father; Heart disease in an unspecified family member; Hyperlipidemia in her father and mother; Hypertension in her father and mother; and Ovarian cancer in her maternal aunts.  There is no history of Colon cancer. Allergies  Allergen Reactions  . Penicillins Swelling    Throat swelling  . Sulfa Drugs Cross Reactors Swelling    Throat swelling  . Contrast Media (Iodinated Diagnostic Agents) Hives        Review of Systems:Positive for neck pain and left shoulder pain. Negative for dysphagia abdominal pain or change in bowel habits  The remainder of  the 10 point ROS is negative except as outlined in H&P   Physical Exam: General appearance  Well developed, in no distress., Overweight  Eyes- non icteric. HEENT nontraumatic, normocephalic. Mouth no lesions, tongue papillated, no cheilosis. Neck supple without adenopathy, thyroid not enlarged, no carotid bruits, no JVD. Lungs Clear to auscultation bilaterally. Cor normal S1, normal S2, regular rhythm, no murmur,  quiet precordium. Abdomen: Protuberant soft nontender. Normoactive bowel sounds. Post cholecystectomy scar. No ascites. Rectal:Not done.  Extremities no pedal edema. Skin no lesions. Neurological alert and oriented x 3. Psychological normal mood and affect.  Assessment and Plan:  Problem #1 Chronic gastroesophageal reflux disease and a 4 cm hiatal hernia likely causing intermittent nocturnal symptoms of regurgitation. We will add ranitidine 150 mg at bedtime to achieve better nocturnal acid suppression. She will continue on her proton pump inhibitor in the mornings. She will also continue antireflux measures. Since she does not have any symptoms of LPR or progressive symptoms, I would hold off on upper endoscopy at this time. She will try to minimize her anti-inflammatory medications.  Problem #2 Colorectal screening. Patient's last colonoscopy in 2008 showed mild diverticulosis. Her next colonoscopy will be due in October 2018.   04/16/2012 [name]

## 2012-05-01 ENCOUNTER — Other Ambulatory Visit: Payer: Self-pay | Admitting: Family Medicine

## 2012-05-01 ENCOUNTER — Ambulatory Visit (INDEPENDENT_AMBULATORY_CARE_PROVIDER_SITE_OTHER): Payer: BC Managed Care – PPO | Admitting: Family Medicine

## 2012-05-01 ENCOUNTER — Encounter: Payer: Self-pay | Admitting: Family Medicine

## 2012-05-01 VITALS — BP 125/74 | HR 82 | Ht 68.0 in | Wt 231.0 lb

## 2012-05-01 DIAGNOSIS — R600 Localized edema: Secondary | ICD-10-CM

## 2012-05-01 DIAGNOSIS — R609 Edema, unspecified: Secondary | ICD-10-CM

## 2012-05-01 DIAGNOSIS — D510 Vitamin B12 deficiency anemia due to intrinsic factor deficiency: Secondary | ICD-10-CM

## 2012-05-01 NOTE — Progress Notes (Signed)
Subjective:    Patient ID: Nicole Mata, female    DOB: 06/18/1961, 51 y.o.   MRN: [id number]  HPI Ankle started swelling on Sat night (about 5 days ago). Tried to elevate them at night. Went down some overnight.  Has been putting cortisone cream since the skin was itching. Then a rash.. No SOB or chest pain.  Had been walking a lot at a youth camp.  No change in salt intake.  She has had some occasional ankle swelling but it doesn't usually last this long it wasn't quite as severe. She denies any chest pain or short of breath. She denies any palpitations. No prior history of DVTs. No new medications.   Review of Systems BP 125/74  Pulse 82  Ht 5\' 8"  (1.727 m)  Wt 231 lb (104.781 kg)  BMI 35.12 kg/m2  SpO2 98%  LMP 03/25/2012    Allergies  Allergen Reactions  . Penicillins Swelling    Throat swelling  . Sulfa Drugs Cross Reactors Swelling    Throat swelling  . Contrast Media (Iodinated Diagnostic Agents) Hives    Past Medical History  Diagnosis Date  . Hiatal hernia   . IBS (irritable bowel syndrome)   . GERD (gastroesophageal reflux disease)   . Helicobacter pylori gastritis   . Gastropathy     reactive  . Diverticulosis   . Diabetes mellitus     Past Surgical History  Procedure Date  . Shoulder surgery 2002, 2003    bilateral for bone spurs and torn RC  . Cholecystectomy 2001    History   Social History  . Marital Status: Divorced    Spouse Name: N/A    Number of Children: 0  . Years of Education: N/A   Occupational History  . Customer Service.     Social History Main Topics  . Smoking status: Never Smoker   . Smokeless tobacco: Never Used  . Alcohol Use: No  . Drug Use: No  . Sexually Active: Yes     divorced, no kids, customer service, 2 caffeine drinks daily.   Other Topics Concern  . Not on file   Social History Narrative   1 can of soda a day.  No regular exercise.     Family History  Problem Relation Age of Onset  . Heart disease        family disease  . Heart attack Father 50    grandparents  . Hypertension Father   . Hyperlipidemia Father   . Diabetes Maternal Grandmother   . Diabetes Maternal Uncle   . Ovarian cancer Maternal Aunt     x 2  . Hypertension Mother   . Hyperlipidemia Mother   . Diabetes Brother   . Colon cancer Neg Hx   . Ovarian cancer Maternal Aunt   . Diabetes type II Mother     Outpatient Encounter Prescriptions as of 05/01/2012  Medication Sig Dispense Refill  . albuterol (PROAIR HFA) 108 (90 BASE) MCG/ACT inhaler Inhale 2 puffs into the lungs every 6 (six) hours as needed.  1 Inhaler  1  . cyclobenzaprine (FLEXERIL) 10 MG tablet Take 1 tablet (10 mg total) by mouth at bedtime as needed for muscle spasms.  30 tablet  1  . fluticasone (FLONASE) 50 MCG/ACT nasal spray Place 2 sprays into the nose daily.        . meloxicam (MOBIC) 15 MG tablet Take 1 tablet (15 mg total) by mouth daily.  90 tablet  1  .  norethindrone-ethinyl estradiol (JUNEL 1/20) 1-20 MG-MCG tablet Take 1 tablet by mouth daily.  1 Package  11  . ranitidine (ZANTAC) 150 MG tablet Take 1 tablet (150 mg total) by mouth at bedtime.  30 tablet  10  . vitamin B-12 (CYANOCOBALAMIN) 500 MCG tablet Take 500 mcg by mouth daily.      Marland Kitchen dexlansoprazole (DEXILANT) 60 MG capsule Take 1 capsule (60 mg total) by mouth daily.  30 capsule  2          Objective:   Physical Exam  Constitutional: She is oriented to person, place, and time. She appears well-developed and well-nourished.  HENT:  Head: Normocephalic and atraumatic.  Neck: Neck supple. No thyromegaly present.  Cardiovascular: Normal rate, regular rhythm and normal heart sounds.        No carotid or abdominal bruits.  Pulmonary/Chest: Effort normal and breath sounds normal.  Abdominal: Soft. Bowel sounds are normal. She exhibits no distension and no mass. There is no tenderness. There is no rebound and no guarding.  Musculoskeletal: She exhibits edema.       1+ edema in both  ankles. Worse on the left. Trace edema below the knee.  Lymphadenopathy:    She has no cervical adenopathy.  Neurological: She is alert and oriented to person, place, and time.  Skin: Skin is warm and dry.  Psychiatric: She has a normal mood and affect. Her behavior is normal.   she is obese.        Assessment & Plan:  LE swelling - most likely aggravated from walking and possible increase in salt intake. Likely related to venous stasis. Says it is bilateral it is very unlikely to be related to a blood clot. And it has been improving which is a good sign. We will check a BNP even though she has not had any chest pain or shortness of breath. We will also check for anemia rule out thyroid disorder. Also check renal function and make sure no sign of acute renal failure that she has no other signs or symptoms of this. We will also check a urinalysis to rule out proteinuria. If this continues or recurs she may need to consider compression stockings. In the meantime keep it elevated when she is able. Avoid excess salt.

## 2012-05-01 NOTE — Patient Instructions (Signed)
Make sure eating low salt diet Keep feet elevated  Can consider wearing compression stocking

## 2012-05-02 LAB — URINALYSIS
Bilirubin Urine: NEGATIVE
Glucose, UA: NEGATIVE mg/dL
Hgb urine dipstick: NEGATIVE
Ketones, ur: NEGATIVE mg/dL
Nitrite: NEGATIVE
Specific Gravity, Urine: 1.024 (ref 1.005–1.030)
pH: 6.5 (ref 5.0–8.0)

## 2012-05-02 LAB — CBC WITH DIFFERENTIAL/PLATELET
Basophils Absolute: 0 10*3/uL (ref 0.0–0.1)
Eosinophils Absolute: 0.3 10*3/uL (ref 0.0–0.7)
Eosinophils Relative: 3 % (ref 0–5)
MCH: 29 pg (ref 26.0–34.0)
MCHC: 32.2 g/dL (ref 30.0–36.0)
MCV: 90 fL (ref 78.0–100.0)
Platelets: 357 10*3/uL (ref 150–400)
RDW: 14 % (ref 11.5–15.5)

## 2012-05-02 LAB — COMPLETE METABOLIC PANEL WITH GFR
AST: 15 U/L (ref 0–37)
Alkaline Phosphatase: 63 U/L (ref 39–117)
BUN: 8 mg/dL (ref 6–23)
Calcium: 9 mg/dL (ref 8.4–10.5)
Chloride: 104 mEq/L (ref 96–112)
Creat: 0.63 mg/dL (ref 0.50–1.10)

## 2012-05-11 ENCOUNTER — Emergency Department
Admit: 2012-05-11 | Discharge: 2012-05-11 | Disposition: A | Discharge status: Home / Self Care | Payer: BC Managed Care – PPO

## 2012-05-11 ENCOUNTER — Emergency Department
Admission: EM | Admit: 2012-05-11 | Discharge: 2012-05-11 | Disposition: A | Discharge status: Home / Self Care | Payer: BC Managed Care – PPO | Source: Home / Self Care | Attending: Emergency Medicine | Admitting: Emergency Medicine

## 2012-05-11 ENCOUNTER — Encounter: Payer: Self-pay | Admitting: Emergency Medicine

## 2012-05-11 DIAGNOSIS — M79672 Pain in left foot: Secondary | ICD-10-CM

## 2012-05-11 DIAGNOSIS — M79609 Pain in unspecified limb: Secondary | ICD-10-CM

## 2012-05-11 NOTE — ED Notes (Signed)
Dropped heavy object on left foot 7 days ago; lacerated top of first 2 toes, but now pain angling across medial aspect of foot preventing her from wearing regular shoes.

## 2012-05-11 NOTE — ED Provider Notes (Signed)
History     CSN: [id number]  Arrival date & time 05/11/12  1134   First MD Initiated Contact with Patient 05/11/12 1138      Chief Complaint  Patient presents with  . Foot Pain    (Consider location/radiation/quality/duration/timing/severity/associated sxs/prior treatment) HPI L foot pain for 7 days after dropping a [employer] on her foot.  She has been having pain around the 1st and 2nd toes and the medial side of her foot.  She used ice initially which help.  She describes as a constant soreness and just wants to make sure that it is not broken.  She states that her foot feels a little swollen and has been wearing flip flops which have been helping her feel better.  She was make it too tight.  She has not been using any medications.  She has no previous history of injury or fracture or sprain in that foot.  Past Medical History  Diagnosis Date  . Hiatal hernia   . IBS (irritable bowel syndrome)   . GERD (gastroesophageal reflux disease)   . Helicobacter pylori gastritis   . Gastropathy     reactive  . Diverticulosis   . Diabetes mellitus     Past Surgical History  Procedure Date  . Shoulder surgery 2002, 2003    bilateral for bone spurs and torn RC  . Cholecystectomy 2001    Family History  Problem Relation Age of Onset  . Heart disease      family disease  . Heart attack Father 50    grandparents  . Hypertension Father   . Hyperlipidemia Father   . Diabetes Maternal Grandmother   . Diabetes Maternal Uncle   . Ovarian cancer Maternal Aunt     x 2  . Hypertension Mother   . Hyperlipidemia Mother   . Diabetes Brother   . Colon cancer Neg Hx   . Ovarian cancer Maternal Aunt   . Diabetes type II Mother     History  Substance Use Topics  . Smoking status: Never Smoker   . Smokeless tobacco: Never Used  . Alcohol Use: No    OB History    Grav Para Term Preterm Abortions TAB SAB Ect Mult Living   1    1  1          Review of Systems  All  other systems reviewed and are negative.    Allergies  Penicillins; Sulfa drugs cross reactors; and Contrast media  Home Medications   Current Outpatient Rx  Name Route Sig Dispense Refill  . ALBUTEROL SULFATE HFA 108 (90 BASE) MCG/ACT IN AERS Inhalation Inhale 2 puffs into the lungs every 6 (six) hours as needed. 1 Inhaler 1  . CYCLOBENZAPRINE HCL 10 MG PO TABS Oral Take 1 tablet (10 mg total) by mouth at bedtime as needed for muscle spasms. 30 tablet 1  . DEXLANSOPRAZOLE 60 MG PO CPDR Oral Take 1 capsule (60 mg total) by mouth daily. 30 capsule 2    MUST HAVE OFFICE VISIT FOR FURTHER REFILLS!  . FLUTICASONE PROPIONATE 50 MCG/ACT NA SUSP Nasal Place 2 sprays into the nose daily.      . MELOXICAM 15 MG PO TABS Oral Take 1 tablet (15 mg total) by mouth daily. 90 tablet 1  . NORETHINDRONE ACET-ETHINYL EST 1-20 MG-MCG PO TABS Oral Take 1 tablet by mouth daily. 1 Package 11  . RANITIDINE HCL 150 MG PO TABS Oral Take 1 tablet (150 mg total) by mouth  at bedtime. 30 tablet 10  . VITAMIN B-12 500 MCG PO TABS Oral Take 500 mcg by mouth daily.      BP 115/75  Pulse 101  Temp(Src) 97.8 F (36.6 C) (Oral)  Resp 16  Ht 5\' 8"  (1.727 m)  Wt 230 lb (104.327 kg)  BMI 34.97 kg/m2  SpO2 98%  LMP 04/22/2012  Physical Exam  Nursing note and vitals reviewed. Constitutional: She is oriented to person, place, and time. She appears well-developed and well-nourished.  HENT:  Head: Normocephalic and atraumatic.  Eyes: No scleral icterus.  Neck: Neck supple.  Cardiovascular: Regular rhythm and normal heart sounds.   Pulmonary/Chest: Effort normal and breath sounds normal. No respiratory distress.  Musculoskeletal:       Left foot: She exhibits tenderness and bony tenderness. She exhibits normal range of motion, no swelling, normal capillary refill and no deformity.       L ankle/foot: FROM, +TTP 1st and 2nd metatarsals mildly.   No TTP medial/lateral malleolus, navicular, base of 5th, calcaneus,  Achilles, proximal fibula.  No swelling.  No ecchymoses.  Distal neurovascular status is intact. Mild abrasion 1st and 2nd toes where she was struck, but no signs of infection.  Neurological: She is alert and oriented to person, place, and time.  Skin: Skin is warm and dry.  Psychiatric: She has a normal mood and affect. Her speech is normal.    ED Course  Procedures (including critical care time)  Labs Reviewed - No data to display Dg Foot Complete Left  05/11/2012  *RADIOLOGY REPORT*  Clinical Data: Foot pain post injury  LEFT FOOT - COMPLETE 3+ VIEW  Comparison: None.  Findings: Three views of the left foot submitted.  Acute fracture or subluxation.  No radiopaque foreign body.  IMPRESSION: No acute fracture or subluxation.  Original Report Authenticated By: Natasha Mead, M.D.     1. Left foot pain       MDM   An x-ray was ordered and read by the radiologist as above.  Encourage rest, ice, compression with ACE bandage and/or a brace, and elevation of injured body part.  The role of anti-inflammatories is discussed with the patient.  Tried on a post-op shoe which she said made it feel better, so she will keep it.  She should gradually improve over the next 1-2 weeks from the bony contusion.  If not, then can refer to sports medicine.  Marlaine Hind, MD 05/11/12 1228

## 2012-05-21 ENCOUNTER — Encounter: Payer: Self-pay | Admitting: Family Medicine

## 2012-05-21 ENCOUNTER — Ambulatory Visit (INDEPENDENT_AMBULATORY_CARE_PROVIDER_SITE_OTHER): Payer: BC Managed Care – PPO | Admitting: Family Medicine

## 2012-05-21 VITALS — BP 134/87 | HR 85 | Ht 68.0 in | Wt 227.0 lb

## 2012-05-21 DIAGNOSIS — R059 Cough, unspecified: Secondary | ICD-10-CM

## 2012-05-21 DIAGNOSIS — R05 Cough: Secondary | ICD-10-CM

## 2012-05-21 DIAGNOSIS — M79671 Pain in right foot: Secondary | ICD-10-CM

## 2012-05-21 DIAGNOSIS — M79609 Pain in unspecified limb: Secondary | ICD-10-CM

## 2012-05-21 NOTE — Progress Notes (Signed)
Subjective:    Patient ID: Nicole Mata, female    DOB: 05/16/1961, 51 y.o.   MRN: [id number]  HPI   Starting Thursday of last week, will get pain on the tops of her feet. Says radiating up into her leg.  The ankle are hurting and swell at night.  Feet swell too at night.  Dropped the camping stove on the left foot. No fracture. Last week work flip flops after the injury and pain started on Wednesay.  She says even her hip felt sore one day..    Now has a hacking cough again.  Says avoiding soft drinks but still eating some greasy foods.  Still occ pain radiating into her back. Hx of reflux. Not taking her mobic much at all.  She takes Exelon daily for her reflux. She says she's not sure if she has any allergies or not. The cough is sometimes productive sometimes wet. She denies any other cold or respiratory type symptoms. No fever. She's not taking any medications for it specifically.  Waking up with the heart racing for about 2-3 weeks.  Thyroid was normal a couple of weeks ago.     Review of Systems BP 134/87  Pulse 85  Ht 5\' 8"  (1.727 m)  Wt 227 lb (102.967 kg)  BMI 34.52 kg/m2  LMP 04/22/2012    Allergies  Allergen Reactions  . Penicillins Swelling    Throat swelling  . Sulfa Drugs Cross Reactors Swelling    Throat swelling  . Contrast Media (Iodinated Diagnostic Agents) Hives    Past Medical History  Diagnosis Date  . Hiatal hernia   . IBS (irritable bowel syndrome)   . GERD (gastroesophageal reflux disease)   . Helicobacter pylori gastritis   . Gastropathy     reactive  . Diverticulosis   . Diabetes mellitus     Past Surgical History  Procedure Date  . Shoulder surgery 2002, 2003    bilateral for bone spurs and torn RC  . Cholecystectomy 2001    History   Social History  . Marital Status: Divorced    Spouse Name: N/A    Number of Children: 0  . Years of Education: N/A   Occupational History  . Customer Service.     Social History Main Topics  .  Smoking status: Never Smoker   . Smokeless tobacco: Never Used  . Alcohol Use: No  . Drug Use: No  . Sexually Active: Yes     divorced, no kids, customer service, 2 caffeine drinks daily.   Other Topics Concern  . Not on file   Social History Narrative   1 can of soda a day.  No regular exercise.     Family History  Problem Relation Age of Onset  . Heart disease      family disease  . Heart attack Father 50    grandparents  . Hypertension Father   . Hyperlipidemia Father   . Diabetes Maternal Grandmother   . Diabetes Maternal Uncle   . Ovarian cancer Maternal Aunt     x 2  . Hypertension Mother   . Hyperlipidemia Mother   . Diabetes Brother   . Colon cancer Neg Hx   . Ovarian cancer Maternal Aunt   . Diabetes type II Mother     Outpatient Encounter Prescriptions as of 05/21/2012  Medication Sig Dispense Refill  . albuterol (PROAIR HFA) 108 (90 BASE) MCG/ACT inhaler Inhale 2 puffs into the lungs every 6 (six) hours as  needed.  1 Inhaler  1  . cyclobenzaprine (FLEXERIL) 10 MG tablet Take 1 tablet (10 mg total) by mouth at bedtime as needed for muscle spasms.  30 tablet  1  . fluticasone (FLONASE) 50 MCG/ACT nasal spray Place 2 sprays into the nose daily.        . meloxicam (MOBIC) 15 MG tablet Take 1 tablet (15 mg total) by mouth daily.  90 tablet  1  . norethindrone-ethinyl estradiol (JUNEL 1/20) 1-20 MG-MCG tablet Take 1 tablet by mouth daily.  1 Package  11  . ranitidine (ZANTAC) 150 MG tablet Take 1 tablet (150 mg total) by mouth at bedtime.  30 tablet  10  . vitamin B-12 (CYANOCOBALAMIN) 500 MCG tablet Take 500 mcg by mouth daily.      Marland Kitchen dexlansoprazole (DEXILANT) 60 MG capsule Take 1 capsule (60 mg total) by mouth daily.  30 capsule  2          Objective:   Physical Exam  Constitutional: She is oriented to person, place, and time. She appears well-developed and well-nourished.  HENT:  Head: Normocephalic and atraumatic.  Right Ear: External ear normal.  Left  Ear: External ear normal.  Nose: Nose normal.  Mouth/Throat: Oropharynx is clear and moist.       TMs and canals are clear.   Eyes: Conjunctivae and EOM are normal. Pupils are equal, round, and reactive to light.  Neck: Neck supple. No thyromegaly present.  Cardiovascular: Normal rate, regular rhythm and normal heart sounds.   Pulmonary/Chest: Effort normal and breath sounds normal. She has no wheezes.  Musculoskeletal:       Ankle with no swelling. NROM.  No swelling. Nontender feet. No redness.  Strength is 5/5.no sores or callouses.   Lymphadenopathy:    She has no cervical adenopathy.  Neurological: She is alert and oriented to person, place, and time.  Skin: Skin is warm and dry.  Psychiatric: She has a normal mood and affect.          Assessment & Plan:  Bilateral foot pain - radiating up into the legs. I really think this is induced by wearing flip-flops for an entire week. I encouraged her to stay away from flip flops and avoid wearing high heels. She has a pair of heels on today. Wear flat shoe wear that have a half-inch heel elevation. If she's not improving in the next 2 weeks then refer to podiatry for further evaluation. As far as the left foot that was injured by the camping stove it looks fantastic and she is nontender and her abrasions are well-healed.Discussed massage and heat to help the muscle tissue heal.   Cough - could be reflux related.  Her GI told her can take ranitidine at bedtime with her dexilant if needed. I encouraged her to take the ranitidine at bedtime for the next 3-4 nights consistently. In addition to that she can try Claritin or Zyrtec in the evening about an hour before bedtime and see if this makes a difference as well. If it does not respond to and improved reflex regimen or an allergy regimen and I recommend we get chest x-ray. Her lung exam is normal today and she is afebrile.

## 2012-05-21 NOTE — Patient Instructions (Addendum)
Take zantac each night for 3-4 nights in a row Trial of claritin or zyrtec at bedtime.   Call if not better in one week.  If still has coughing we will get a chest xray.   Don't wear flip-flops.

## 2012-05-26 ENCOUNTER — Encounter: Payer: Self-pay | Admitting: Emergency Medicine

## 2012-05-26 ENCOUNTER — Emergency Department
Admission: EM | Admit: 2012-05-26 | Discharge: 2012-05-26 | Disposition: A | Discharge status: Home / Self Care | Payer: BC Managed Care – PPO | Source: Home / Self Care | Attending: Family Medicine | Admitting: Family Medicine

## 2012-05-26 DIAGNOSIS — R202 Paresthesia of skin: Secondary | ICD-10-CM

## 2012-05-26 DIAGNOSIS — R030 Elevated blood-pressure reading, without diagnosis of hypertension: Secondary | ICD-10-CM

## 2012-05-26 DIAGNOSIS — R209 Unspecified disturbances of skin sensation: Secondary | ICD-10-CM

## 2012-05-26 DIAGNOSIS — R51 Headache: Secondary | ICD-10-CM

## 2012-05-26 NOTE — ED Provider Notes (Signed)
History     CSN: [id number]  Arrival date & time 05/26/12  1708   First MD Initiated Contact with Patient 05/26/12 1735      Chief Complaint  Patient presents with  . Headache  . Tingling      HPI Comments: Patient complains of intermittent feeling of being flushed 2 or 3 times daily for about a week.  No fevers, chills, or sweats.  She has had recurring headaches and vague intermittent tingling in her extremities for about two weeks.  No chest pain, shortness of breath, palpitations.  No other neuro symptoms.  She has checked her blood pressure and it has been higher than normal.  She notes that she has gained about 15 pounds over the past year.  She states that she takes daily oral Vitamin B12  The history is provided by the patient.    Past Medical History  Diagnosis Date  . Hiatal hernia   . IBS (irritable bowel syndrome)   . GERD (gastroesophageal reflux disease)   . Helicobacter pylori gastritis   . Gastropathy     reactive  . Diverticulosis   . Diabetes mellitus     Past Surgical History  Procedure Date  . Shoulder surgery 2002, 2003    bilateral for bone spurs and torn RC  . Cholecystectomy 2001    Family History  Problem Relation Age of Onset  . Heart disease      family disease  . Heart attack Father 50    grandparents  . Hypertension Father   . Hyperlipidemia Father   . Diabetes Maternal Grandmother   . Diabetes Maternal Uncle   . Ovarian cancer Maternal Aunt     x 2  .  Hypertension Mother   . Hyperlipidemia Mother   . Diabetes Brother   . Colon cancer Neg Hx   . Ovarian cancer Maternal Aunt   . Diabetes type II Mother     History  Substance Use Topics  . Smoking status: Never Smoker   . Smokeless tobacco: Never Used  . Alcohol Use: No    OB History    Grav Para Term Preterm Abortions TAB SAB Ect Mult Living   1    1  1          Review of Systems  Constitutional: Positive for fatigue. Negative for fever, chills and activity change.  Eyes: Negative.   Respiratory: Negative.   Cardiovascular: Negative.   Gastrointestinal: Negative.   Genitourinary: Negative.   Musculoskeletal: Negative.   Neurological: Positive for headaches. Negative for dizziness, facial asymmetry, speech difficulty, weakness and numbness.       Paresthesias in extremities    Allergies  Penicillins; Sulfa drugs cross reactors; and Contrast media  Home Medications   Current Outpatient Rx  Name Route Sig Dispense Refill  . ALBUTEROL SULFATE HFA 108 (90 BASE) MCG/ACT IN AERS Inhalation Inhale 2 puffs into the lungs every 6 (six) hours as needed. 1 Inhaler 1  . CYCLOBENZAPRINE HCL 10 MG PO TABS Oral Take 1 tablet (10 mg total) by mouth at bedtime as needed for muscle spasms. 30 tablet 1  . DEXLANSOPRAZOLE 60 MG PO CPDR Oral Take 1 capsule (60 mg total) by mouth daily. 30 capsule 2    MUST HAVE OFFICE VISIT FOR FURTHER REFILLS!  . FLUTICASONE PROPIONATE 50 MCG/ACT NA SUSP Nasal Place 2 sprays into the nose daily.      . MELOXICAM 15 MG PO TABS Oral Take 1 tablet (15 mg total) by mouth daily. 90 tablet 1  . NORETHINDRONE ACET-ETHINYL EST 1-20 MG-MCG PO TABS Oral Take 1 tablet by mouth daily. 1 Package 11  . RANITIDINE HCL 150 MG PO TABS Oral Take 1 tablet (150 mg total) by mouth at bedtime. 30 tablet 10  . VITAMIN B-12 500 MCG PO TABS Oral Take 500 mcg by mouth daily.      BP 134/79  Pulse 84  Temp 98.6 F (37 C) (Oral)  Resp 16  Ht 5\' 8"  (1.727 m)  Wt 225 lb  (102.059 kg)  BMI 34.21 kg/m2  SpO2 100%  LMP 04/22/2012  Physical Exam Nursing notes and Vital Signs reviewed. Appearance:  Patient appears stated age, and in no acute distress.  Patient is obese (BMI 34.3)  Eyes:  Pupils are equal, round, and reactive to  light and accomodation.  Extraocular movement is intact.  Conjunctivae are not inflamed.  Fundi benign  Ears:  Canals normal.  Tympanic membranes normal.  Nose:  Mildly congested turbinates.  No sinus tenderness.   Pharynx:  Normal Neck:  Supple.  No adenopathy.  No thyromegaly.  Carotids normal. Lungs:  Clear to auscultation.  Breath sounds are equal.  Heart:  Regular rate and rhythm without murmurs, rubs, or gallops.  Abdomen:  Nontender without masses or hepatosplenomegaly.  Bowel sounds are present.  No CVA or flank tenderness.  Extremities:  No edema.  No calf tenderness.  Pedal pulses intact. Neurologic:  Cranial nerves 2 through 12 are normal.  Patellar and achilles reflexes are normal.  Cerebellar function is intact (finger-to-nose and rapid alternating hand movement).  Gait and station are normal.    .  Skin:  No rash present.   ED Course  Procedures    Labs Reviewed - chart reviewed.  Note B12 level at lower limits on 05/02/12.  Note Hgb A1c 6.3 on 02/07/12.  Note mild anemia 11.9 on 05/01/12    1. Blood pressure elevated without history of HTN   2. Headache   3. Paresthesias   Exam unremarkable today except for mildly elevated BP and obesitiy.    MDM  Recommend that patient monitor her blood pressure and record.  Continue present medications and daily Vitamin B12.   Recommend daily vitamin D and calcium.  Followup with PCP (suggest a vitamin D level).        Lattie Haw, MD 05/29/12 [id number]

## 2012-05-26 NOTE — ED Notes (Signed)
Pt. Worried about hypertension; experiencing intermittent tingling in extremeties along with headache; started monitoring her BP today and it was higher than she thought it should be. Saw Dr.Metheney last week about extremeties and was told to see how things felt over week.

## 2012-05-26 NOTE — ED Notes (Signed)
Also is perimenopausal.

## 2012-05-29 NOTE — Discharge Instructions (Signed)
Continue present medications.  Recommend daily Vitamin D and calcium

## 2012-06-03 ENCOUNTER — Telehealth: Payer: Self-pay | Admitting: *Deleted

## 2012-06-03 MED ORDER — LISINOPRIL 10 MG PO TABS: 10.0000 mg | ORAL_TABLET | Freq: Every day | ORAL | Status: DC

## 2012-06-03 NOTE — Telephone Encounter (Signed)
LMOM for pt to return call. 

## 2012-06-03 NOTE — Telephone Encounter (Signed)
Pt states that she has had a HA for about a week. Her BPs having been running 135-140/85-90. Looks like pt went to UC on 05/31/12 for elevated BP and HA. Please advise if you would like for me to bring pt in for visit or any other suggestions.

## 2012-06-03 NOTE — Telephone Encounter (Signed)
I will start her on lisinopril 10mg  and have her f/u in 2 weeks.

## 2012-06-03 NOTE — Telephone Encounter (Signed)
Pt informed. States she has a f/u coming up and will be seen for both.

## 2012-06-06 ENCOUNTER — Encounter: Payer: Self-pay | Admitting: *Deleted

## 2012-06-06 ENCOUNTER — Emergency Department
Admission: EM | Admit: 2012-06-06 | Discharge: 2012-06-06 | Disposition: A | Discharge status: Home / Self Care | Payer: BC Managed Care – PPO | Source: Home / Self Care | Attending: Family Medicine | Admitting: Family Medicine

## 2012-06-06 ENCOUNTER — Emergency Department (HOSPITAL_BASED_OUTPATIENT_CLINIC_OR_DEPARTMENT_OTHER): Payer: BC Managed Care – PPO

## 2012-06-06 ENCOUNTER — Encounter (HOSPITAL_BASED_OUTPATIENT_CLINIC_OR_DEPARTMENT_OTHER): Payer: Self-pay | Admitting: *Deleted

## 2012-06-06 ENCOUNTER — Telehealth: Payer: Self-pay | Admitting: *Deleted

## 2012-06-06 ENCOUNTER — Inpatient Hospital Stay (HOSPITAL_BASED_OUTPATIENT_CLINIC_OR_DEPARTMENT_OTHER)
Admission: EM | Admit: 2012-06-06 | Discharge: 2012-06-09 | DRG: 814 | Disposition: A | Discharge status: Home / Self Care | Payer: BC Managed Care – PPO | Attending: Internal Medicine | Admitting: Internal Medicine

## 2012-06-06 DIAGNOSIS — A09 Infectious gastroenteritis and colitis, unspecified: Principal | ICD-10-CM | POA: Diagnosis present

## 2012-06-06 DIAGNOSIS — Z8719 Personal history of other diseases of the digestive system: Secondary | ICD-10-CM

## 2012-06-06 DIAGNOSIS — Z882 Allergy status to sulfonamides status: Secondary | ICD-10-CM

## 2012-06-06 DIAGNOSIS — R197 Diarrhea, unspecified: Secondary | ICD-10-CM

## 2012-06-06 DIAGNOSIS — E860 Dehydration: Secondary | ICD-10-CM | POA: Diagnosis present

## 2012-06-06 DIAGNOSIS — E119 Type 2 diabetes mellitus without complications: Secondary | ICD-10-CM | POA: Diagnosis present

## 2012-06-06 DIAGNOSIS — Z880 Allergy status to penicillin: Secondary | ICD-10-CM

## 2012-06-06 DIAGNOSIS — Z885 Allergy status to narcotic agent status: Secondary | ICD-10-CM

## 2012-06-06 DIAGNOSIS — K529 Noninfective gastroenteritis and colitis, unspecified: Secondary | ICD-10-CM

## 2012-06-06 DIAGNOSIS — E8881 Metabolic syndrome: Secondary | ICD-10-CM

## 2012-06-06 DIAGNOSIS — K219 Gastro-esophageal reflux disease without esophagitis: Secondary | ICD-10-CM | POA: Diagnosis present

## 2012-06-06 DIAGNOSIS — R109 Unspecified abdominal pain: Secondary | ICD-10-CM

## 2012-06-06 DIAGNOSIS — Z9089 Acquired absence of other organs: Secondary | ICD-10-CM

## 2012-06-06 DIAGNOSIS — Z91041 Radiographic dye allergy status: Secondary | ICD-10-CM

## 2012-06-06 DIAGNOSIS — K449 Diaphragmatic hernia without obstruction or gangrene: Secondary | ICD-10-CM | POA: Diagnosis present

## 2012-06-06 DIAGNOSIS — K589 Irritable bowel syndrome without diarrhea: Secondary | ICD-10-CM | POA: Diagnosis present

## 2012-06-06 LAB — COMPREHENSIVE METABOLIC PANEL
ALT: 8 U/L (ref 0–35)
BUN: 9 mg/dL (ref 6–23)
CO2: 24 mEq/L (ref 19–32)
Calcium: 9.1 mg/dL (ref 8.4–10.5)
Creatinine, Ser: 0.7 mg/dL (ref 0.50–1.10)
GFR calc Af Amer: >90 mL/min (ref >90)
GFR calc non Af Amer: >90 mL/min (ref >90)
Glucose, Bld: 161 mg/dL — ABNORMAL HIGH (ref 70–99)

## 2012-06-06 LAB — URINALYSIS, ROUTINE W REFLEX MICROSCOPIC
Nitrite: NEGATIVE
Protein, ur: 30 mg/dL — AB
Specific Gravity, Urine: 1.029 (ref 1.005–1.030)
Urobilinogen, UA: 0.2 mg/dL (ref 0.0–1.0)

## 2012-06-06 LAB — CBC WITH DIFFERENTIAL/PLATELET
Eosinophils Absolute: 0 10*3/uL (ref 0.0–0.7)
Eosinophils Relative: 0 % (ref 0–5)
HCT: 38.4 % (ref 36.0–46.0)
Lymphocytes Relative: 12 % (ref 12–46)
Lymphs Abs: 1.9 10*3/uL (ref 0.7–4.0)
MCH: 30.4 pg (ref 26.0–34.0)
MCV: 87.1 fL (ref 78.0–100.0)
Monocytes Absolute: 0.8 10*3/uL (ref 0.1–1.0)
Monocytes Relative: 5 % (ref 3–12)
RBC: 4.41 MIL/uL (ref 3.87–5.11)
WBC: 15.9 10*3/uL — ABNORMAL HIGH (ref 4.0–10.5)

## 2012-06-06 LAB — POCT CBC W AUTO DIFF (K'VILLE URGENT CARE)

## 2012-06-06 LAB — LIPASE, BLOOD: Lipase: 24 U/L (ref 11–59)

## 2012-06-06 MED ORDER — HYDROMORPHONE HCL PF 1 MG/ML IJ SOLN
0.5000 mg | Freq: Once | INTRAMUSCULAR | Status: AC
  Administered 2012-06-06: 0.5 mg via INTRAVENOUS
  Filled 2012-06-06: qty 1

## 2012-06-06 MED ORDER — ONDANSETRON HCL 4 MG/2ML IJ SOLN
4.0000 mg | Freq: Once | INTRAMUSCULAR | Status: AC
  Administered 2012-06-06: 4 mg via INTRAVENOUS
  Filled 2012-06-06: qty 2

## 2012-06-06 MED ORDER — HYDROMORPHONE HCL PF 1 MG/ML IJ SOLN
1.0000 mg | Freq: Once | INTRAMUSCULAR | Status: AC
  Administered 2012-06-06: 1 mg via INTRAVENOUS
  Filled 2012-06-06: qty 1

## 2012-06-06 MED ORDER — SODIUM CHLORIDE 0.9 % IV BOLUS (SEPSIS)
1000.0000 mL | Freq: Once | INTRAVENOUS | Status: AC
  Administered 2012-06-06: 1000 mL via INTRAVENOUS

## 2012-06-06 NOTE — ED Notes (Signed)
Abdominal pain woke her at 2 am. Felt like menstrual cramps. Diarrhea and bloating. She was seen at Urgent care and had her WBC was elevated. She was told she has a virus.

## 2012-06-06 NOTE — ED Notes (Signed)
Pt. Reports she woke at 2am with cramping and diarrhea today.  Pt. Was seen at urgent care and was told to have a clear diet.  Pt. Woke 4pm with more cramping and diarrhea.  Pt. Reports she does not feel good at all.  Pt. Reports she has had a lot of burping today.  Nausea per pt. And cramping are really bad.  Pt. Reports she has an urgency to urinate and has been urinating today.

## 2012-06-06 NOTE — ED Notes (Signed)
Pt called states that her abdominal cramping has worsened since her OV earlier today. She also c/o sweating and decreased urination. Advised the pt per dr Cathren Harsh to go to the ED for further evaluation. Pt agrees.

## 2012-06-06 NOTE — ED Provider Notes (Signed)
History     CSN: [id number]  Arrival date & time 06/06/12  1202   First MD Initiated Contact with Patient 06/06/12 1213      Chief Complaint  Patient presents with  . Abdominal Cramping  . Diarrhea      HPI Comments: Patient states that she started taking Lisinopril 10mg , once daily yesterday for mild hypertension.  Yesterday she noted an increase in acid reflux with nausea but no vomiting, and yesterday evening she developed mild crampy abdominal pain.  She has had diarrhea several times early this morning (each time with a decrease in her abdominal pain).  No blood in stool.  No urinary symptoms.  She has had "hot flashes" but no definite fever.  The history is provided by the patient.    Past Medical History  Diagnosis Date  . Hiatal hernia   . IBS (irritable bowel syndrome)   . GERD (gastroesophageal reflux disease)   . Helicobacter pylori gastritis   . Gastropathy     reactive  . Diverticulosis   . Diabetes mellitus     Past Surgical History  Procedure Date  . Shoulder surgery 2002, 2003    bilateral for bone spurs and torn RC  . Cholecystectomy 2001    Family History  Problem Relation Age of Onset  . Heart disease      family disease  . Heart attack Father 50    grandparents  . Hypertension Father   . Hyperlipidemia Father   . Diabetes Maternal Grandmother   . Diabetes Maternal Uncle   . Ovarian cancer Maternal Aunt     x 2  . Hypertension Mother   . Hyperlipidemia Mother   . Diabetes Brother   . Colon cancer Neg Hx   . Ovarian cancer Maternal Aunt   . Diabetes type II Mother     History  Substance Use Topics  . Smoking status: Never Smoker   . Smokeless tobacco: Never Used  . Alcohol Use: No    OB History    Grav Para Term Preterm Abortions TAB SAB Ect Mult Living   1    1  1          Review of Systems No sore throat No cough No pleuritic pain No wheezing No nasal congestion ? post-nasal drainage No sinus pain/pressure No itchy/red  eyes No earache No hemoptysis No SOB No fever, + chills/"hot flashes" + mild nausea No vomiting + intermittent abdominal pain + diarrhea No urinary symptoms No skin rashes + fatigue No myalgias + headache   Allergies  Penicillins; Sulfa drugs cross reactors; and Contrast media  Home Medications   Current Outpatient Rx  Name Route Sig Dispense Refill  . ALBUTEROL SULFATE HFA 108 (90 BASE) MCG/ACT IN AERS Inhalation Inhale 2 puffs into the lungs every 6 (six) hours as needed. 1 Inhaler 1  . CYCLOBENZAPRINE HCL 10 MG PO TABS Oral Take 1 tablet (10 mg total) by mouth at bedtime as needed for muscle spasms. 30 tablet 1  . DEXLANSOPRAZOLE 60 MG PO CPDR Oral Take 1 capsule (60 mg total) by mouth daily. 30 capsule 2    MUST HAVE OFFICE VISIT FOR FURTHER REFILLS!  . FLUTICASONE PROPIONATE 50 MCG/ACT NA SUSP Nasal Place 2 sprays into the nose daily.      Marland Kitchen LISINOPRIL 10 MG PO TABS Oral Take 1 tablet (10 mg total) by mouth daily. 30 tablet 0  . MELOXICAM 15 MG PO TABS Oral Take 1 tablet (15 mg total)  by mouth daily. 90 tablet 1  . NORETHINDRONE ACET-ETHINYL EST 1-20 MG-MCG PO TABS Oral Take 1 tablet by mouth daily. 1 Package 11  . RANITIDINE HCL 150 MG PO TABS Oral Take 1 tablet (150 mg total) by mouth at bedtime. 30 tablet 10  . VITAMIN B-12 500 MCG PO TABS Oral Take 500 mcg by mouth daily.      BP 111/64  Pulse 104  Temp 98.3 F (36.8 C) (Oral)  Resp 14  Ht 5\' 7"  (1.702 m)  Wt 223 lb (101.152 kg)  BMI 34.93 kg/m2  SpO2 97%  LMP 04/22/2012  Physical Exam Nursing notes and Vital Signs reviewed. Appearance:  Patient appears stated age, and in no acute distress.  Patient is obese (BMI 35, Class 2) Eyes:  Pupils are equal, round, and reactive to light and accomodation.  Extraocular movement is intact.  Conjunctivae are not inflamed  Nose:  Normal Pharynx:  Normal;  moist mucous membranes  Neck:  Supple.   No adenopathy Lungs:  Clear to auscultation.  Breath sounds are equal.    Heart:  Regular rate and rhythm without murmurs, rubs, or gallops.  Abdomen:  Protuberant.  Mild peri-umbilical tenderness without masses or hepatosplenomegaly.  Bowel sounds are present.  No CVA or flank tenderness.  Extremities:  No edema.  No calf tenderness Skin:  No rash present.   ED Course  Procedures none   Labs Reviewed  POCT CBC W AUTO DIFF ([facility]):    WBC 10.8; LY 25.5; MO 6.5; GR 68.0; Hgb 12.8; Platelets 321       1. Diarrhea;  Suspect a viral gastroenteritis (note mildly elevated WBC).  However, must also consider early diverticulitis, ?adverse effect from lisinopril.  2. Abdominal pain       MDM  Stop lisinopril until follow-up with PCP. Begin clear liquids for about 12 hours, then may begin a BRAT diet when nausea and loose stools improve. Then gradually advance to a regular diet as tolerated.  Avoid milk products until well. To decrease diarrhea (after today), mix one heaping tablespoon Citrucel (methylcellulose) in 8 oz water and drink one to three times daily.  If symptoms become significantly worse during the night or over the weekend, proceed to the local emergency room.  Followup with Family Doctor in about 4 days.        Lattie Haw, MD 06/06/12 1359

## 2012-06-06 NOTE — ED Notes (Signed)
Family at bedside. 

## 2012-06-06 NOTE — ED Notes (Signed)
Attempted to call report on Pt. And the RN is in an isolation room per Licensed conveyancer.

## 2012-06-06 NOTE — ED Provider Notes (Signed)
History     CSN: [id number]  Arrival date & time 06/06/12  1819   First MD Initiated Contact with Patient 06/06/12 1837      Chief Complaint  Patient presents with  . Abdominal Pain    (Consider location/radiation/quality/duration/timing/severity/associated sxs/prior treatment) HPI  H/o IBS, diabetes, GERD, hiatal hernia presents with abdominal pain. The patient states that she's experienced abdominal pain which is diffuse and crampy since early this morning approximately 2:30. She states she's had nonbloody diarrhea x4 since that time. She complains of nausea without vomiting. Denies back pain. Denies hematuria/dysuria/freq/urgency. Mild distention, bloating. Denies fevers, chills. Feels different than her IBD. Was seen at [facility] earlier today and noted to have leukocytosis per pt (see EPIC for full note)  Abd Surgeries include cholecystectomy    ED Notes, ED Provider Notes from 06/06/12 0000 to 06/06/12 18:31:34       Julieanne Manson, RN 06/06/2012 18:30      Abdominal pain woke her at 2 am. Felt like menstrual cramps. Diarrhea and bloating. She was seen at Urgent care and had her WBC was elevated. She was told she has a virus.         Tami Ribas, LPN 08/08/6212 08:65      Pt called states that her abdominal cramping has worsened since her OV earlier today. She also c/o sweating and decreased urination. Advised the pt per dr Cathren Harsh to go to the ED for further evaluation. Pt agrees.      Past Medical History  Diagnosis Date  . Hiatal hernia   . IBS (irritable bowel syndrome)   . GERD (gastroesophageal reflux disease)   . Helicobacter pylori gastritis   . Gastropathy     reactive  . Diverticulosis   . Diabetes mellitus     Past Surgical History  Procedure Date  . Shoulder surgery 2002, 2003    bilateral for bone spurs and torn RC  . Cholecystectomy 2001    Family History  Problem Relation Age of Onset  . Heart disease      family disease  .  Heart attack Father 50    grandparents  . Hypertension Father   . Hyperlipidemia Father   . Diabetes Maternal Grandmother   . Diabetes Maternal Uncle   . Ovarian cancer Maternal Aunt     x 2  . Hypertension Mother   . Hyperlipidemia Mother   . Diabetes Brother   . Colon cancer Neg Hx   . Ovarian cancer Maternal Aunt   . Diabetes type II Mother     History  Substance Use Topics  . Smoking status: Never Smoker   . Smokeless tobacco: Never Used  . Alcohol Use: No    OB History    Grav Para Term Preterm Abortions TAB SAB Ect Mult Living   1    1  1          Review of Systems  All other systems reviewed and are negative.   except as noted HPI   Allergies  Penicillins; Sulfa drugs cross reactors; and Contrast media  Home Medications   Current Outpatient Rx  Name Route Sig Dispense Refill  . ALBUTEROL SULFATE HFA 108 (90 BASE) MCG/ACT IN AERS Inhalation Inhale 2 puffs into the lungs every 6 (six) hours as needed. For shortness of breath or wheezing    . CYCLOBENZAPRINE HCL 10 MG PO TABS Oral Take 1 tablet (10 mg total) by mouth at bedtime as needed for muscle spasms. 30  tablet 1  . DEXLANSOPRAZOLE 60 MG PO CPDR Oral Take 60 mg by mouth daily.    Marland Kitchen DIPHENHYDRAMINE HCL 25 MG PO TABS Oral Take 25 mg by mouth every 6 (six) hours as needed. For allergies    . KETOTIFEN FUMARATE 0.025 % OP SOLN Both Eyes Place 1 drop into both eyes every morning.    Marland Kitchen LISINOPRIL 10 MG PO TABS Oral Take 1 tablet (10 mg total) by mouth daily. 30 tablet 0  . MELOXICAM 15 MG PO TABS Oral Take 1 tablet (15 mg total) by mouth daily. 90 tablet 1  . NORETHINDRONE ACET-ETHINYL EST 1-20 MG-MCG PO TABS Oral Take 1 tablet by mouth daily. 1 Package 11  . RANITIDINE HCL 150 MG PO TABS Oral Take 1 tablet (150 mg total) by mouth at bedtime. 30 tablet 10  . VITAMIN B-12 500 MCG PO TABS Oral Take 500 mcg by mouth daily.      BP 114/50  Pulse 88  Temp 98 F (36.7 C) (Oral)  Resp 19  SpO2 96%  LMP  04/22/2012  Physical Exam  Nursing note and vitals reviewed. Constitutional: She is oriented to person, place, and time. She appears well-developed.  HENT:  Head: Atraumatic.       Mm dry  Eyes: Conjunctivae and EOM are normal. Pupils are equal, round, and reactive to light.  Neck: Normal range of motion. Neck supple.  Cardiovascular: Regular rhythm, normal heart sounds and intact distal pulses.        tachycardic  Pulmonary/Chest: Effort normal and breath sounds normal. No respiratory distress. She has no wheezes. She has no rales.  Abdominal: Soft. She exhibits distension. There is tenderness. There is no rebound and no guarding.       Mild diffuse abd ttp  Musculoskeletal: Normal range of motion.  Neurological: She is alert and oriented to person, place, and time.  Skin: Skin is warm and dry. No rash noted.  Psychiatric: She has a normal mood and affect.    ED Course  Procedures (including critical care time)  Labs Reviewed  URINALYSIS, ROUTINE W REFLEX MICROSCOPIC - Abnormal; Notable for the following:    Color, Urine AMBER (*)  BIOCHEMICALS MAY BE AFFECTED BY COLOR   APPearance CLOUDY (*)     Hgb urine dipstick TRACE (*)     Bilirubin Urine SMALL (*)     Ketones, ur 15 (*)     Protein, ur 30 (*)     All other components within normal limits  URINE MICROSCOPIC-ADD ON - Abnormal; Notable for the following:    Squamous Epithelial / LPF FEW (*)     All other components within normal limits  CBC WITH DIFFERENTIAL - Abnormal; Notable for the following:    WBC 15.9 (*)     Neutrophils Relative 83 (*)     Neutro Abs 13.1 (*)     All other components within normal limits  COMPREHENSIVE METABOLIC PANEL - Abnormal; Notable for the following:    Glucose, Bld 161 (*)     Albumin 3.4 (*)     All other components within normal limits  LIPASE, BLOOD   Ct Abdomen Pelvis Wo Contrast  06/06/2012  *RADIOLOGY REPORT*  Clinical Data: Abdominal pain  CT ABDOMEN AND PELVIS WITHOUT  CONTRAST  Technique:  Multidetector CT imaging of the abdomen and pelvis was performed following the standard protocol without intravenous contrast.  Comparison: 12/04/2008  Findings: Minimal dependent atelectasis.  Trace pericardial fluid versus thickening.  Organ  abnormality/lesion detection is limited in the absence of intravenous contrast. Within this limitation, the unremarkable liver, spleen, pancreas, adrenal glands.  Absent gallbladder.  No biliary ductal dilatation.  Nonobstructing lower pole stone on the left.  Left parapelvic cysts.  No hydronephrosis or hydroureter.  No CT evidence for colitis.  Normal appendix.  Normal terminal ileum.  There is a loop of distal ileum that appears thickened. There is a significant amount of interloop fluid and mesenteric fat stranding.  Fat containing supraumbilical hernia with mild associated fat stranding.  No free intraperitoneal air. No lymphadenopathy.  Decompressed bladder.  Unremarkable CT appearance to the uterus and adnexa.  Normal caliber vasculature.  Further vascular evaluation is not possible in the absence of intravenous contrast.  No acute osseous finding.  IMPRESSION: Nonspecific ileitis involving a segment of distal ileum. Differential includes ischemic, infectious, and inflammatory etiologies. The terminal ileum appears normal.  There is a small to moderate amount of free fluid which can be reactive or seen with perforation.  There is no free intraperitoneal air to confirm/favor perforation. Unfortunately, ingested contrast had yet to reach the involved segment at the time of imaging.  Discussed via telephone with Dr. Hyman Hopes at 09:30 p.m. on 06/06/2012.  Nonobstructing lower pole stone on the left.  Anterior abdominal wall fat containing hernia.  Minimal associated stranding.  Correlate clinically if concern for incarceration.  Original Report Authenticated By: Waneta Martins, M.D.     1. Ileitis   2. Dehydration     MDM  Ileitis with  moderate free fluid. Suboptimal study due to IV contrast allergy and PO contrast not reaching terminal ileum. Pt cont to have pain and nausea. Zofran and dilaudid given in the ED. Admit for serial abd exams and pain control, advance diet as tolerated. Discussed transfer with [facility] Dr. Toniann Fail.        Forbes Cellar, MD 06/06/12 2244

## 2012-06-06 NOTE — ED Notes (Signed)
Patient c/o abdominal cramps and diarrhea x early this am. She has not taken any otc meds. She started a new Lisinopril 2 days ago.

## 2012-06-06 NOTE — ED Notes (Signed)
Pt. Is in no distress with mother at bedside.  Pt. Is in process of drinking her contrast.

## 2012-06-07 DIAGNOSIS — K5289 Other specified noninfective gastroenteritis and colitis: Secondary | ICD-10-CM

## 2012-06-07 DIAGNOSIS — E8881 Metabolic syndrome: Secondary | ICD-10-CM

## 2012-06-07 DIAGNOSIS — K529 Noninfective gastroenteritis and colitis, unspecified: Secondary | ICD-10-CM | POA: Diagnosis present

## 2012-06-07 DIAGNOSIS — R197 Diarrhea, unspecified: Secondary | ICD-10-CM | POA: Diagnosis present

## 2012-06-07 LAB — CBC
Hemoglobin: 11.3 g/dL — ABNORMAL LOW (ref 12.0–15.0)
MCH: 29.5 pg (ref 26.0–34.0)
MCHC: 33.1 g/dL (ref 30.0–36.0)

## 2012-06-07 LAB — COMPREHENSIVE METABOLIC PANEL
ALT: 6 U/L (ref 0–35)
AST: 10 U/L (ref 0–37)
Alkaline Phosphatase: 55 U/L (ref 39–117)
CO2: 26 mEq/L (ref 19–32)
Calcium: 7.9 mg/dL — ABNORMAL LOW (ref 8.4–10.5)
Chloride: 103 mEq/L (ref 96–112)
GFR calc non Af Amer: >90 mL/min (ref >90)
Potassium: 3.9 mEq/L (ref 3.5–5.1)
Sodium: 138 mEq/L (ref 135–145)
Total Bilirubin: 0.4 mg/dL (ref 0.3–1.2)

## 2012-06-07 LAB — GLUCOSE, CAPILLARY
Glucose-Capillary: 119 mg/dL — ABNORMAL HIGH (ref 70–99)
Glucose-Capillary: 89 mg/dL (ref 70–99)
Glucose-Capillary: 91 mg/dL (ref 70–99)

## 2012-06-07 LAB — HEMOGLOBIN A1C: Hgb A1c MFr Bld: 6.3 % — ABNORMAL HIGH (ref <5.7)

## 2012-06-07 MED ORDER — OLOPATADINE HCL 0.1 % OP SOLN
1.0000 [drp] | Freq: Every morning | OPHTHALMIC | Status: DC
  Administered 2012-06-08 – 2012-06-09 (×2): 1 [drp] via OPHTHALMIC
  Filled 2012-06-07: qty 5

## 2012-06-07 MED ORDER — HYDROMORPHONE HCL PF 1 MG/ML IJ SOLN
1.0000 mg | INTRAMUSCULAR | Status: DC | PRN
  Administered 2012-06-07 (×2): 0.5 mg via INTRAVENOUS
  Filled 2012-06-07 (×2): qty 1

## 2012-06-07 MED ORDER — DEXLANSOPRAZOLE 60 MG PO CPDR
60.0000 mg | DELAYED_RELEASE_CAPSULE | Freq: Every day | ORAL | Status: DC
  Administered 2012-06-07 – 2012-06-09 (×3): 60 mg via ORAL
  Filled 2012-06-07: qty 1

## 2012-06-07 MED ORDER — GUAIFENESIN-DM 100-10 MG/5ML PO SYRP
5.0000 mL | ORAL_SOLUTION | ORAL | Status: DC | PRN
  Filled 2012-06-07: qty 5

## 2012-06-07 MED ORDER — NORETHINDRONE ACET-ETHINYL EST 1-20 MG-MCG PO TABS
1.0000 | ORAL_TABLET | Freq: Every day | ORAL | Status: DC
  Administered 2012-06-07 – 2012-06-09 (×3): 1 via ORAL

## 2012-06-07 MED ORDER — DEXLANSOPRAZOLE 60 MG PO CPDR
60.0000 mg | DELAYED_RELEASE_CAPSULE | Freq: Every day | ORAL | Status: DC
Start: 2012-06-07 — End: 2012-06-07
  Filled 2012-06-07: qty 1

## 2012-06-07 MED ORDER — LISINOPRIL 10 MG PO TABS
10.0000 mg | ORAL_TABLET | Freq: Every day | ORAL | Status: DC
  Filled 2012-06-07 (×3): qty 1

## 2012-06-07 MED ORDER — ONDANSETRON HCL 4 MG/2ML IJ SOLN
4.0000 mg | Freq: Four times a day (QID) | INTRAMUSCULAR | Status: DC | PRN
  Administered 2012-06-07 – 2012-06-08 (×2): 4 mg via INTRAVENOUS
  Filled 2012-06-07 (×3): qty 2

## 2012-06-07 MED ORDER — FAMOTIDINE 20 MG PO TABS
20.0000 mg | ORAL_TABLET | Freq: Every day | ORAL | Status: DC
  Filled 2012-06-07 (×2): qty 1

## 2012-06-07 MED ORDER — PANTOPRAZOLE SODIUM 40 MG PO TBEC: 40.0000 mg | DELAYED_RELEASE_TABLET | Freq: Every day | ORAL | Status: DC

## 2012-06-07 MED ORDER — CIPROFLOXACIN IN D5W 400 MG/200ML IV SOLN
400.0000 mg | Freq: Two times a day (BID) | INTRAVENOUS | Status: DC
  Administered 2012-06-07 – 2012-06-08 (×3): 400 mg via INTRAVENOUS
  Filled 2012-06-07 (×5): qty 200

## 2012-06-07 MED ORDER — ACETAMINOPHEN 650 MG RE SUPP: 650.0000 mg | Freq: Four times a day (QID) | RECTAL | Status: DC | PRN

## 2012-06-07 MED ORDER — ONDANSETRON HCL 4 MG PO TABS: 4.0000 mg | ORAL_TABLET | Freq: Four times a day (QID) | ORAL | Status: DC | PRN

## 2012-06-07 MED ORDER — SODIUM CHLORIDE 0.9 % IV SOLN
INTRAVENOUS | Status: AC
  Administered 2012-06-07: 01:00:00 via INTRAVENOUS

## 2012-06-07 MED ORDER — POTASSIUM CHLORIDE IN NACL 20-0.9 MEQ/L-% IV SOLN
INTRAVENOUS | Status: DC
  Administered 2012-06-07: 17:00:00 via INTRAVENOUS
  Filled 2012-06-07 (×2): qty 1000

## 2012-06-07 MED ORDER — METRONIDAZOLE IN NACL 5-0.79 MG/ML-% IV SOLN
500.0000 mg | Freq: Three times a day (TID) | INTRAVENOUS | Status: DC
  Administered 2012-06-07 – 2012-06-08 (×4): 500 mg via INTRAVENOUS
  Filled 2012-06-07 (×7): qty 100

## 2012-06-07 MED ORDER — SODIUM CHLORIDE 0.9 % IV SOLN
INTRAVENOUS | Status: AC
  Administered 2012-06-07: 04:00:00 via INTRAVENOUS

## 2012-06-07 MED ORDER — ALBUTEROL SULFATE HFA 108 (90 BASE) MCG/ACT IN AERS: 2.0000 | INHALATION_SPRAY | Freq: Four times a day (QID) | RESPIRATORY_TRACT | Status: DC | PRN

## 2012-06-07 MED ORDER — HYDROCODONE-ACETAMINOPHEN 5-325 MG PO TABS: 1.0000 | ORAL_TABLET | ORAL | Status: DC | PRN

## 2012-06-07 MED ORDER — INSULIN ASPART 100 UNIT/ML ~~LOC~~ SOLN: 0.0000 [IU] | SUBCUTANEOUS | Status: DC

## 2012-06-07 MED ORDER — ALBUTEROL SULFATE (5 MG/ML) 0.5% IN NEBU: 2.5000 mg | INHALATION_SOLUTION | RESPIRATORY_TRACT | Status: DC | PRN

## 2012-06-07 MED ORDER — ACETAMINOPHEN 325 MG PO TABS: 650.0000 mg | ORAL_TABLET | Freq: Four times a day (QID) | ORAL | Status: DC | PRN

## 2012-06-07 NOTE — Progress Notes (Signed)
Reviewed Ms Nicole Mata chart. Saw and examined her at bed side. She says the pain is much improved compared to yesterday, and she is hungry. Her wbc has declined from 15000 to normal. Will start clears, and observe, if more pain, consult surgery. Otherwise continue orders per Dr Adela Glimpse.  Lulia Schriner,MD [phone]

## 2012-06-07 NOTE — H&P (Signed)
PCP:  Nani Gasser, MD  [facility]   Chief Complaint:   Abdominal pain   HPI: Nicole Mata is a 50 y.o. female   has a past medical history of Hiatal hernia; IBS (irritable bowel syndrome); GERD (gastroesophageal reflux disease); Helicobacter pylori gastritis; Gastropathy; Diverticulosis; and Diabetes mellitus.   Presented with  1 day of abdominal pain, cramping, some nausea and one episode of vomiting. 8-9 episode of diarrhea that is watery no blood in stool. Have not been eating out. No recent antibiotic use. She did go to [employer] out but only ate her own food. She has been seen by Dr. Juanda Chance for GERD last time was seen 1 month ago. No IBD in the family or personal history of such.   Denies any fever or chills.   Review of Systems:    Pertinent positives include: nausea, vomiting, diarrhea,  abdominal pain,  Constitutional:  No weight loss, night sweats, Fevers, chills, fatigue, weight loss  HEENT:  No headaches, Difficulty swallowing,Tooth/dental problems,Sore throat,  No sneezing, itching, ear ache, nasal congestion, post nasal drip,  Cardio-vascular:  No chest pain, Orthopnea, PND, anasarca, dizziness, palpitations.no Bilateral lower extremity swelling  GI:  No heartburn, indigestion, change in bowel habits, loss of appetite, melena, blood in stool, hematemesis Resp:  no shortness of breath at rest. No dyspnea on exertion, No excess mucus, no productive cough, No non-productive cough, No coughing up of blood.No change in color of mucus.No wheezing. Skin:  no rash or lesions. No jaundice GU:  no dysuria, change in color of urine, no urgency or frequency. No straining to urinate.  No flank pain.  Musculoskeletal:  No joint pain or no joint swelling. No decreased range of motion. No back pain.  Psych:  No change in mood or affect. No depression or anxiety. No memory loss.  Neuro: no localizing neurological complaints, no tingling, no weakness, no double  vision, no gait abnormality, no slurred speech, no confusion  Otherwise ROS are negative except for above, 10 systems were reviewed  Past Medical History: Past Medical History  Diagnosis Date  . Hiatal hernia   . IBS (irritable bowel syndrome)   . GERD (gastroesophageal reflux disease)   . Helicobacter pylori gastritis   . Gastropathy     reactive  . Diverticulosis   . Diabetes mellitus    Past Surgical History  Procedure Date  . Shoulder surgery 2002, 2003    bilateral for bone spurs and torn RC  . Cholecystectomy 2001     Medications: Prior to Admission medications   Medication Sig Start Date End Date Taking? Authorizing Provider  albuterol (PROVENTIL HFA;VENTOLIN HFA) 108 (90 BASE) MCG/ACT inhaler Inhale 2 puffs into the lungs every 6 (six) hours as needed. For shortness of breath or wheezing   Yes Historical Provider, MD  cyclobenzaprine (FLEXERIL) 10 MG tablet Take 1 tablet (10 mg total) by mouth at bedtime as needed for muscle spasms. 06/28/11 06/27/12 Yes Agapito Games, MD  dexlansoprazole (DEXILANT) 60 MG capsule Take 60 mg by mouth daily.   Yes Historical Provider, MD  diphenhydrAMINE (BENADRYL) 25 MG tablet Take 25 mg by mouth every 6 (six) hours as needed. For allergies   Yes Historical Provider, MD  ketotifen (ZADITOR) 0.025 % ophthalmic solution Place 1 drop into both eyes every morning.   Yes Historical Provider, MD  lisinopril (PRINIVIL,ZESTRIL) 10 MG tablet Take 1 tablet (10 mg total) by mouth daily. 06/03/12 06/03/13 Yes Agapito Games, MD  meloxicam [facility])  15 MG tablet Take 1 tablet (15 mg total) by mouth daily. 06/28/11  Yes Agapito Games, MD  norethindrone-ethinyl estradiol (JUNEL 1/20) 1-20 MG-MCG tablet Take 1 tablet by mouth daily. 02/14/12 02/13/13 Yes Lesly Dukes, MD  ranitidine (ZANTAC) 150 MG tablet Take 1 tablet (150 mg total) by mouth at bedtime. 04/16/12 04/16/13 Yes Hart Carwin, MD  vitamin B-12 (CYANOCOBALAMIN) 500 MCG tablet Take  500 mcg by mouth daily.   Yes Historical Provider, MD    Allergies:   Allergies  Allergen Reactions  . Penicillins Anaphylaxis  . Sulfa Drugs Cross Reactors Anaphylaxis  . Contrast Media (Iodinated Diagnostic Agents) Hives  . Morphine And Related     Social History:  Ambulatory  independently  Lives at home with family   reports that she has never smoked. She has never used smokeless tobacco. She reports that she does not drink alcohol or use illicit drugs.   Family History: family history includes Diabetes in her brother, maternal grandmother, and maternal uncle; Diabetes type II in her mother; Heart attack (age of onset:50) in her father; Heart disease in an unspecified family member; Hyperlipidemia in her father and mother; Hypertension in her father and mother; and Ovarian cancer in her maternal aunts.  There is no history of Colon cancer.    Physical Exam: Patient Vitals for the past 24 hrs:  BP Temp Temp src Pulse Resp SpO2  06/07/12 0041 126/57 mmHg 98.2 F (36.8 C) Oral 90  18  97 %  06/06/12 2138 114/50 mmHg - - 88  19  96 %  06/06/12 1830 109/66 mmHg 98 F (36.7 C) Oral 105  20  98 %    1. General:  in No Acute distress 2. Psychological: Alert andOriented 3. Head/ENT:   Moist  Mucous Membranes                          Head Non traumatic, neck supple                          Normal Dentition 4. SKIN: normal  Skin turgor,  Skin clean Dry and intact no rash 5. Heart: Regular rate and rhythm no Murmur, Rub or gallop 6. Lungs: Clear to auscultation bilaterally, no wheezes or crackles   7. Abdomen: Soft, mildly tender, somewhat distended, hyper active bowel sounds, some suprapubic tenderness, no rebound tenderness, no guarding 8. Lower extremities: no clubbing, cyanosis, or edema 9. Neurologically Grossly intact, moving all 4 extremities equally 10. MSK: Normal range of motion  body mass index is unknown because there is no height or weight on file.   Labs on  Admission:   [facility] 06/06/12 1915  NA 136  K 3.8  CL 101  CO2 24  GLUCOSE 161*  BUN 9  CREATININE 0.70  CALCIUM 9.1  MG --  PHOS --    Basename 06/06/12 1915  AST 14  ALT 8  ALKPHOS 64  BILITOT 0.4  PROT 7.1  ALBUMIN 3.4*    Basename 06/06/12 1915  LIPASE 24  AMYLASE --    Basename 06/06/12 1915  WBC 15.9*  NEUTROABS 13.1*  HGB 13.4  HCT 38.4  MCV 87.1  PLT 342   No results found for this basename: CKTOTAL:3,CKMB:3,CKMBINDEX:3,TROPONINI:3 in the last 72 hours No results found for this basename: TSH,T4TOTAL,FREET3,T3FREE,THYROIDAB in the last 72 hours No results found for this basename: VITAMINB12:2,FOLATE:2,FERRITIN:2,TIBC:2,IRON:2,RETICCTPCT:2 in the last 72 hours  Lab Results  Component Value Date   HGBA1C 6.3* 02/06/2012    The CrCl is unknown because both a height and weight (above a minimum accepted value) are required for this calculation. ABG No results found for this basename: phart, pco2, po2, hco3, tco2, acidbasedef, o2sat     No results found for this basename: DDIMER     Other results:  UA no evidence of UTI   Cultures:    Component Value Date/Time   SDES URINE, CLEAN CATCH 05/09/2009 1932   SPECREQUEST NONE 05/09/2009 1932   CULT Multiple bacterial morphotypes present, none predominant. Suggest appropriate recollection if clinically indicated. 05/09/2009 1932   REPTSTATUS 05/10/2009 FINAL 05/09/2009 1932       Radiological Exams on Admission: Ct Abdomen Pelvis Wo Contrast  06/06/2012  *RADIOLOGY REPORT*  Clinical Data: Abdominal pain  CT ABDOMEN AND PELVIS WITHOUT CONTRAST  Technique:  Multidetector CT imaging of the abdomen and pelvis was performed following the standard protocol without intravenous contrast.  Comparison: 12/04/2008  Findings: Minimal dependent atelectasis.  Trace pericardial fluid versus thickening.  Organ abnormality/lesion detection is limited in the absence of intravenous contrast. Within this limitation, the unremarkable  liver, spleen, pancreas, adrenal glands.  Absent gallbladder.  No biliary ductal dilatation.  Nonobstructing lower pole stone on the left.  Left parapelvic cysts.  No hydronephrosis or hydroureter.  No CT evidence for colitis.  Normal appendix.  Normal terminal ileum.  There is a loop of distal ileum that appears thickened. There is a significant amount of interloop fluid and mesenteric fat stranding.  Fat containing supraumbilical hernia with mild associated fat stranding.  No free intraperitoneal air. No lymphadenopathy.  Decompressed bladder.  Unremarkable CT appearance to the uterus and adnexa.  Normal caliber vasculature.  Further vascular evaluation is not possible in the absence of intravenous contrast.  No acute osseous finding.  IMPRESSION: Nonspecific ileitis involving a segment of distal ileum. Differential includes ischemic, infectious, and inflammatory etiologies. The terminal ileum appears normal.  There is a small to moderate amount of free fluid which can be reactive or seen with perforation.  There is no free intraperitoneal air to confirm/favor perforation. Unfortunately, ingested contrast had yet to reach the involved segment at the time of imaging.  Discussed via telephone with Dr. Hyman Hopes at 09:30 p.m. on 06/06/2012.  Nonobstructing lower pole stone on the left.  Anterior abdominal wall fat containing hernia.  Minimal associated stranding.  Correlate clinically if concern for incarceration.  Original Report Authenticated By: Waneta Martins, M.D.    Assessment/Plan 51 yo with ileitis, hx of diet controlled DM  Present on Admission:  .Ileitis - etiology unclear, infectious etiology possible, will cover with cipro/ flagyl, bowel rest, if no improvement or worsens would get surgery involved. CT scan could not rule out small perforation but no free air.  .Diarrhea - stool studies, not consistent with c. Dif based on history .GERD - continue home medications .Insulin resistance - SSI, check  HgAIC    Prophylaxis: SCD Protonix  CODE STATUS:FULL CODE  Other plan as per orders.  I have spent a total of 55 min on this admission  Nyoka Alcoser 06/07/2012, 1:30 AM

## 2012-06-08 LAB — CBC
HCT: 31.3 % — ABNORMAL LOW (ref 36.0–46.0)
MCHC: 34.2 g/dL (ref 30.0–36.0)
MCV: 88.9 fL (ref 78.0–100.0)
RDW: 13.4 % (ref 11.5–15.5)

## 2012-06-08 LAB — COMPREHENSIVE METABOLIC PANEL
Alkaline Phosphatase: 51 U/L (ref 39–117)
BUN: 5 mg/dL — ABNORMAL LOW (ref 6–23)
CO2: 23 mEq/L (ref 19–32)
Calcium: 8 mg/dL — ABNORMAL LOW (ref 8.4–10.5)
GFR calc Af Amer: >90 mL/min (ref >90)
GFR calc non Af Amer: >90 mL/min (ref >90)
Glucose, Bld: 103 mg/dL — ABNORMAL HIGH (ref 70–99)
Total Protein: 5.8 g/dL — ABNORMAL LOW (ref 6.0–8.3)

## 2012-06-08 LAB — FECAL LACTOFERRIN, QUANT: Fecal Lactoferrin: POSITIVE

## 2012-06-08 LAB — GLUCOSE, CAPILLARY
Glucose-Capillary: 80 mg/dL (ref 70–99)
Glucose-Capillary: 90 mg/dL (ref 70–99)
Glucose-Capillary: 98 mg/dL (ref 70–99)

## 2012-06-08 MED ORDER — INSULIN ASPART 100 UNIT/ML ~~LOC~~ SOLN: 0.0000 [IU] | Freq: Every day | SUBCUTANEOUS | Status: DC

## 2012-06-08 MED ORDER — POTASSIUM CHLORIDE 20 MEQ/15ML (10%) PO LIQD
40.0000 meq | Freq: Once | ORAL | Status: AC
  Administered 2012-06-08: 40 meq via ORAL
  Filled 2012-06-08: qty 30

## 2012-06-08 MED ORDER — CIPROFLOXACIN HCL 500 MG PO TABS
500.0000 mg | ORAL_TABLET | Freq: Two times a day (BID) | ORAL | Status: DC
  Administered 2012-06-08 – 2012-06-09 (×3): 500 mg via ORAL
  Filled 2012-06-08 (×6): qty 1

## 2012-06-08 MED ORDER — METRONIDAZOLE 500 MG PO TABS
500.0000 mg | ORAL_TABLET | Freq: Three times a day (TID) | ORAL | Status: DC
  Administered 2012-06-08 – 2012-06-09 (×3): 500 mg via ORAL
  Filled 2012-06-08 (×8): qty 1

## 2012-06-08 MED ORDER — INSULIN ASPART 100 UNIT/ML ~~LOC~~ SOLN: 0.0000 [IU] | Freq: Three times a day (TID) | SUBCUTANEOUS | Status: DC

## 2012-06-08 NOTE — Progress Notes (Signed)
SUBJECTIVE Feels better. Tolerating diet, less abdominal pain. Had bowel movement earlier today.   1. Ileitis   2. Dehydration   3. Diarrhea   4. Insulin resistance     Past Medical History  Diagnosis Date  . Hiatal hernia   . IBS (irritable bowel syndrome)   . GERD (gastroesophageal reflux disease)   . Helicobacter pylori gastritis   . Gastropathy     reactive  . Diverticulosis   . Diabetes mellitus    Current Facility-Administered Medications  Medication Dose Route Frequency Provider Last Rate Last Dose  . acetaminophen (TYLENOL) tablet 650 mg  650 mg Oral Q6H PRN Therisa Doyne, MD       Or  . acetaminophen (TYLENOL) suppository 650 mg  650 mg Rectal Q6H PRN Therisa Doyne, MD      . albuterol (PROVENTIL HFA;VENTOLIN HFA) 108 (90 BASE) MCG/ACT inhaler 2 puff  2 puff Inhalation Q6H PRN Therisa Doyne, MD      . albuterol (PROVENTIL) (5 MG/ML) 0.5% nebulizer solution 2.5 mg  2.5 mg Nebulization Q2H PRN Therisa Doyne, MD      . ciprofloxacin (CIPRO) tablet 500 mg  500 mg Oral BID Jerlean Peralta, MD   500 mg at 06/08/12 1319  . dexlansoprazole (DEXILANT) capsule 60 mg  60 mg Oral Daily Kendra P Hiatt, PHARMD   60 mg at 06/08/12 1007  . guaiFENesin-dextromethorphan (ROBITUSSIN DM) 100-10 MG/5ML syrup 5 mL  5 mL Oral Q4H PRN Therisa Doyne, MD      . HYDROcodone-acetaminophen (NORCO) 5-325 MG per tablet 1-2 tablet  1-2 tablet Oral Q4H PRN Therisa Doyne, MD      . HYDROmorphone (DILAUDID) injection 1 mg  1 mg Intravenous Q4H PRN Caroline More, NP   0.5 mg at 06/07/12 1617  . insulin aspart (novoLOG) injection 0-9 Units  0-9 Units Subcutaneous Q4H Therisa Doyne, MD      . lisinopril (PRINIVIL,ZESTRIL) tablet 10 mg  10 mg Oral Daily Therisa Doyne, MD      . metroNIDAZOLE (FLAGYL) tablet 500 mg  500 mg Oral Q8H Emmakate Hypes, MD   500 mg at 06/08/12 1427  . norethindrone-ethinyl estradiol (MICROGESTIN,JUNEL,LOESTRIN) 1-20 MG-MCG tablet 1 tablet  1  tablet Oral Daily Therisa Doyne, MD   1 tablet at 06/08/12 1008  . olopatadine (PATANOL) 0.1 % ophthalmic solution 1 drop  1 drop Both Eyes q morning - 10a Therisa Doyne, MD   1 drop at 06/08/12 1007  . ondansetron (ZOFRAN) tablet 4 mg  4 mg Oral Q6H PRN Therisa Doyne, MD       Or  . ondansetron (ZOFRAN) injection 4 mg  4 mg Intravenous Q6H PRN Therisa Doyne, MD   4 mg at 06/07/12 0437  . DISCONTD: 0.9 % NaCl with KCl 20 mEq/ L  infusion   Intravenous Continuous Koriana Stepien, MD 75 mL/hr at 06/07/12 1729    . DISCONTD: ciprofloxacin (CIPRO) IVPB 400 mg  400 mg Intravenous Q12H Therisa Doyne, MD   400 mg at 06/08/12 0422  . DISCONTD: famotidine (PEPCID) tablet 20 mg  20 mg Oral QHS Therisa Doyne, MD      . DISCONTD: metroNIDAZOLE (FLAGYL) IVPB 500 mg  500 mg Intravenous Q8H Therisa Doyne, MD   500 mg at 06/08/12 9604   Allergies  Allergen Reactions  . Penicillins Anaphylaxis  . Sulfa Drugs Cross Reactors Anaphylaxis  . Contrast Media (Iodinated Diagnostic Agents) Hives  . Morphine And Related    Principal Problem:  *Ileitis Active Problems:  GERD  Insulin resistance  Diarrhea   Vital signs in last 24 hours: Temp:  [97.9 F (36.6 C)-98.3 F (36.8 C)] 98.3 F (36.8 C) (07/06 1400) Pulse Rate:  [78-93] 78  (07/06 1400) Resp:  [18-20] 18  (07/06 1400) BP: (117-123)/(60-68) 123/68 mmHg (07/06 1400) SpO2:  [100 %] 100 % (07/06 1400) Weight change:  Last BM Date: 06/07/12  Intake/Output from previous day: 07/05 0701 - 07/06 0700 In: 1983.8 [P.O.:120; I.V.:863.8; IV Piggyback:1000] Out: -  Intake/Output this shift: Total I/O In: 720 [P.O.:720] Out: -   Lab Results:  [facility] 06/08/12 0610 06/07/12 0610  WBC 7.1 10.1  HGB 10.7* 11.3*  HCT 31.3* 34.1*  PLT 231 301   BMET  Basename 06/08/12 0610 06/07/12 0610  NA 138 138  K 3.6 3.9  CL 106 103  CO2 23 26  GLUCOSE 103* 122*  BUN 5* 7  CREATININE 0.69 0.74  CALCIUM 8.0* 7.9*     Studies/Results: Ct Abdomen Pelvis Wo Contrast  06/06/2012  *RADIOLOGY REPORT*  Clinical Data: Abdominal pain  CT ABDOMEN AND PELVIS WITHOUT CONTRAST  Technique:  Multidetector CT imaging of the abdomen and pelvis was performed following the standard protocol without intravenous contrast.  Comparison: 12/04/2008  Findings: Minimal dependent atelectasis.  Trace pericardial fluid versus thickening.  Organ abnormality/lesion detection is limited in the absence of intravenous contrast. Within this limitation, the unremarkable liver, spleen, pancreas, adrenal glands.  Absent gallbladder.  No biliary ductal dilatation.  Nonobstructing lower pole stone on the left.  Left parapelvic cysts.  No hydronephrosis or hydroureter.  No CT evidence for colitis.  Normal appendix.  Normal terminal ileum.  There is a loop of distal ileum that appears thickened. There is a significant amount of interloop fluid and mesenteric fat stranding.  Fat containing supraumbilical hernia with mild associated fat stranding.  No free intraperitoneal air. No lymphadenopathy.  Decompressed bladder.  Unremarkable CT appearance to the uterus and adnexa.  Normal caliber vasculature.  Further vascular evaluation is not possible in the absence of intravenous contrast.  No acute osseous finding.  IMPRESSION: Nonspecific ileitis involving a segment of distal ileum. Differential includes ischemic, infectious, and inflammatory etiologies. The terminal ileum appears normal.  There is a small to moderate amount of free fluid which can be reactive or seen with perforation.  There is no free intraperitoneal air to confirm/favor perforation. Unfortunately, ingested contrast had yet to reach the involved segment at the time of imaging.  Discussed via telephone with Dr. Hyman Hopes at 09:30 p.m. on 06/06/2012.  Nonobstructing lower pole stone on the left.  Anterior abdominal wall fat containing hernia.  Minimal associated stranding.  Correlate clinically if concern  for incarceration.  Original Report Authenticated By: Waneta Martins, M.D.    Medications: I have reviewed the patient's current medications.   Physical exam GENERAL- alert HEAD- normal atraumatic, no neck masses, normal thyroid, no jvd RESPIRATORY- appears well, vitals normal, no respiratory distress, acyanotic, normal RR, ear and throat exam is normal, neck free of mass or lymphadenopathy, chest clear, no wheezing, crepitations, rhonchi, normal symmetric air entry CVS- regular rate and rhythm, S1, S2 normal, no murmur, click, rub or gallop ABDOMEN- abdomen is soft without significant tenderness, masses, organomegaly or guarding NEURO- Grossly normal EXTREMITIES- extremities normal, atraumatic, no cyanosis or edema  Plan   * Ileitis- presumed infectious. Better. Afebrile, wbc now normal. Switch to oral flagyl/cipro. Advance diet. Defer further gi work up to Dr Juanda Chance outpatient.  * GERD- on ppi.  *  Insulin resistance- BS stable.  * Diarrhea- seems better. dvt prophylaxis. Likely d/c in am.    Dereona Kolodny 06/08/2012 3:20 PM Pager: [phone].

## 2012-06-09 LAB — COMPREHENSIVE METABOLIC PANEL
Albumin: 2.9 g/dL — ABNORMAL LOW (ref 3.5–5.2)
Alkaline Phosphatase: 51 U/L (ref 39–117)
BUN: 4 mg/dL — ABNORMAL LOW (ref 6–23)
CO2: 27 mEq/L (ref 19–32)
Chloride: 104 mEq/L (ref 96–112)
Creatinine, Ser: 0.7 mg/dL (ref 0.50–1.10)
GFR calc Af Amer: >90 mL/min (ref >90)
GFR calc non Af Amer: >90 mL/min (ref >90)
Glucose, Bld: 98 mg/dL (ref 70–99)
Potassium: 3.3 mEq/L — ABNORMAL LOW (ref 3.5–5.1)
Total Bilirubin: 0.2 mg/dL — ABNORMAL LOW (ref 0.3–1.2)

## 2012-06-09 LAB — GLUCOSE, CAPILLARY

## 2012-06-09 LAB — CBC
HCT: 30.6 % — ABNORMAL LOW (ref 36.0–46.0)
Hemoglobin: 10.7 g/dL — ABNORMAL LOW (ref 12.0–15.0)
MCV: 88.4 fL (ref 78.0–100.0)
RDW: 13.2 % (ref 11.5–15.5)
WBC: 7.8 10*3/uL (ref 4.0–10.5)

## 2012-06-09 MED ORDER — HYDROCODONE-ACETAMINOPHEN 5-325 MG PO TABS: 1.0000 | ORAL_TABLET | ORAL | Status: AC | PRN

## 2012-06-09 MED ORDER — METRONIDAZOLE 500 MG PO TABS: 500.0000 mg | ORAL_TABLET | Freq: Three times a day (TID) | ORAL | Status: AC

## 2012-06-09 MED ORDER — ONDANSETRON HCL 4 MG PO TABS: 4.0000 mg | ORAL_TABLET | Freq: Four times a day (QID) | ORAL | Status: AC | PRN

## 2012-06-09 MED ORDER — POTASSIUM CHLORIDE 20 MEQ/15ML (10%) PO LIQD
40.0000 meq | Freq: Once | ORAL | Status: DC
  Filled 2012-06-09: qty 30

## 2012-06-09 MED ORDER — POTASSIUM CHLORIDE CRYS ER 20 MEQ PO TBCR
40.0000 meq | EXTENDED_RELEASE_TABLET | Freq: Once | ORAL | Status: AC
  Administered 2012-06-09: 40 meq via ORAL
  Filled 2012-06-09: qty 2

## 2012-06-09 MED ORDER — CIPROFLOXACIN HCL 500 MG PO TABS: 500.0000 mg | ORAL_TABLET | Freq: Two times a day (BID) | ORAL | Status: AC

## 2012-06-09 NOTE — Discharge Summary (Signed)
DISCHARGE SUMMARY  Nicole Mata  MR#: [id number]  DOB:[birth year]  Date of Admission: 06/06/2012 Date of Discharge: 06/09/2012  Attending Physician:Jasminemarie Sherrard  Patient's UUV:[id number], MD  Consults: none.  Discharge Diagnoses: Present on Admission:  .Ileitis .GERD .Insulin resistance .Diarrhea, presumed infectious.  Hospital Course: Nicole Mata was admitted on 06/07/12 with abdominal pain and diarrhea. She was found to have ileitis by CT abdomen/pelvis. Her wbc was [id number]. She was started on cipro/flagyl, with rapid normalization of her wbc, and resolution of the abdominal pain and diarrhea. She likely had an infectious etiology of her abdominal signs and symptoms. She is followed by Dr Juanda Chance, hence I will defer further gi work up to her. Nicole Mata will call Dr Regino Schultze office for an appointment in the next 1-2 weeks. She will complete total of 14 days of cipro/flagyl for the ileitis. She is discharged in stable condition.   Medication List  As of 06/09/2012 10:51 AM   STOP taking these medications         meloxicam 15 MG tablet         TAKE these medications         albuterol 108 (90 BASE) MCG/ACT inhaler   Commonly known as: PROVENTIL HFA;VENTOLIN HFA   Inhale 2 puffs into the lungs every 6 (six) hours as needed. For shortness of breath or wheezing      ciprofloxacin 500 MG tablet   Commonly known as: CIPRO   Take 1 tablet (500 mg total) by mouth 2 (two) times daily.      cyclobenzaprine 10 MG tablet   Commonly known as: FLEXERIL   Take 1 tablet (10 mg total) by mouth at bedtime as needed for muscle spasms.      DEXILANT 60 MG capsule   Generic drug: dexlansoprazole   Take 60 mg by mouth daily.      diphenhydrAMINE 25 MG tablet   Commonly known as: BENADRYL   Take 25 mg by mouth every 6 (six) hours as needed. For allergies      HYDROcodone-acetaminophen 5-325 MG per tablet   Commonly known as: NORCO   Take 1-2 tablets by mouth every 4 (four) hours as  needed.      ketotifen 0.025 % ophthalmic solution   Commonly known as: ZADITOR   Place 1 drop into both eyes every morning.      lisinopril 10 MG tablet   Commonly known as: PRINIVIL,ZESTRIL   Take 1 tablet (10 mg total) by mouth daily.      metroNIDAZOLE 500 MG tablet   Commonly known as: FLAGYL   Take 1 tablet (500 mg total) by mouth every 8 (eight) hours.      norethindrone-ethinyl estradiol 1-20 MG-MCG tablet   Commonly known as: MICROGESTIN,JUNEL,LOESTRIN   Take 1 tablet by mouth daily.      ondansetron 4 MG tablet   Commonly known as: ZOFRAN   Take 1 tablet (4 mg total) by mouth every 6 (six) hours as needed for nausea.      ranitidine 150 MG tablet   Commonly known as: ZANTAC   Take 1 tablet (150 mg total) by mouth at bedtime.      vitamin B-12 500 MCG tablet   Commonly known as: CYANOCOBALAMIN   Take 500 mcg by mouth daily.             Day of Discharge BP 129/72  Pulse 77  Temp 98.2 F (36.8 C) (Oral)  Resp 18  Ht 5\' 7"  (1.702 m)  Wt 101.152 kg (223 lb)  BMI 34.93 kg/m2  SpO2 98%  LMP 04/22/2012  Physical Exam: At baseline.  Results for orders placed during the hospital encounter of 06/06/12 (from the past 24 hour(s))  GLUCOSE, CAPILLARY     Status: Normal   Collection Time   06/08/12 12:00 PM      Component Value Range   Glucose-Capillary 96  70 - 99 mg/dL   Comment 1 Notify RN    GLUCOSE, CAPILLARY     Status: Abnormal   Collection Time   06/08/12  4:56 PM      Component Value Range   Glucose-Capillary 102 (*) 70 - 99 mg/dL   Comment 1 Notify RN    GLUCOSE, CAPILLARY     Status: Normal   Collection Time   06/08/12 10:08 PM      Component Value Range   Glucose-Capillary 98  70 - 99 mg/dL   Comment 1 Notify RN     Comment 2 Documented in Chart    CBC     Status: Abnormal   Collection Time   06/09/12  4:45 AM      Component Value Range   WBC 7.8  4.0 - 10.5 K/uL   RBC 3.46 (*) 3.87 - 5.11 MIL/uL   Hemoglobin 10.7 (*) 12.0 - 15.0 g/dL   HCT  14.7 (*) 82.9 - 46.0 %   MCV 88.4  78.0 - 100.0 fL   MCH 30.9  26.0 - 34.0 pg   MCHC 35.0  30.0 - 36.0 g/dL   RDW 56.2  13.0 - 86.5 %   Platelets 235  150 - 400 K/uL  COMPREHENSIVE METABOLIC PANEL     Status: Abnormal   Collection Time   06/09/12  4:45 AM      Component Value Range   Sodium 140  135 - 145 mEq/L   Potassium 3.3 (*) 3.5 - 5.1 mEq/L   Chloride 104  96 - 112 mEq/L   CO2 27  19 - 32 mEq/L   Glucose, Bld 98  70 - 99 mg/dL   BUN 4 (*) 6 - 23 mg/dL   Creatinine, Ser 7.84  0.50 - 1.10 mg/dL   Calcium 8.5  8.4 - 69.6 mg/dL   Total Protein 6.2  6.0 - 8.3 g/dL   Albumin 2.9 (*) 3.5 - 5.2 g/dL   AST 13  0 - 37 U/L   ALT 7  0 - 35 U/L   Alkaline Phosphatase 51  39 - 117 U/L   Total Bilirubin 0.2 (*) 0.3 - 1.2 mg/dL   GFR calc non Af Amer >90  >90 mL/min   GFR calc Af Amer >90  >90 mL/min  GLUCOSE, CAPILLARY     Status: Abnormal   Collection Time   06/09/12  8:17 AM      Component Value Range   Glucose-Capillary 134 (*) 70 - 99 mg/dL   Comment 1 Notify RN      Disposition: home today.   Follow-up Appts: Discharge Orders    Future Appointments: Provider: Department: Dept Phone: Center:   06/18/2012 8:15 AM Agapito Games, MD [facility] [phone] [id number]     Future Orders Please Complete By Expires   Diet - low sodium heart healthy      Increase activity slowly         Follow-up Information    Call [name], MD.   Contact information:   [address] [facility] [address] [place]  [address] [place] [address] [phone]          Tests Needing Follow-up: Per Dr Juanda Chance.  Time spent in discharge (includes decision making & examination of pt): 20 minutes  Signed: Earnest Thalman 06/09/2012, 10:51 AM

## 2012-06-10 ENCOUNTER — Telehealth: Payer: Self-pay | Admitting: Internal Medicine

## 2012-06-10 NOTE — Telephone Encounter (Signed)
Informed pt of f/u on 06/26/12 at 10:45am; pt stated understanding.

## 2012-06-11 ENCOUNTER — Telehealth: Payer: Self-pay | Admitting: *Deleted

## 2012-06-11 ENCOUNTER — Telehealth: Payer: Self-pay | Admitting: Family Medicine

## 2012-06-11 LAB — STOOL CULTURE

## 2012-06-11 NOTE — Telephone Encounter (Signed)
Stop the cipro adn continue the flagyl. Let me know if feels better off the cipro. If gets SOB then may need to go back to ED.

## 2012-06-11 NOTE — Telephone Encounter (Signed)
Pt was at [facility] hospital over the holiday and states they put her on Flagyl and Cipro. Pt states that she is having swollen legs, joint aches, tightness in chest and coughing. States tht she has read also that these are side effects and would like to know what she should do. Pt is scheduling a f/u with you. Please advise.

## 2012-06-12 ENCOUNTER — Telehealth: Payer: Self-pay | Admitting: Family Medicine

## 2012-06-12 NOTE — Telephone Encounter (Signed)
LMOM with instructions and to call back with any questions.

## 2012-06-13 ENCOUNTER — Encounter: Payer: Self-pay | Admitting: *Deleted

## 2012-06-18 ENCOUNTER — Ambulatory Visit: Payer: BC Managed Care – PPO | Admitting: Family Medicine

## 2012-06-19 ENCOUNTER — Encounter: Payer: Self-pay | Admitting: Physician Assistant

## 2012-06-19 ENCOUNTER — Ambulatory Visit (INDEPENDENT_AMBULATORY_CARE_PROVIDER_SITE_OTHER): Payer: BC Managed Care – PPO | Admitting: Physician Assistant

## 2012-06-19 VITALS — BP 126/71 | HR 86 | Wt 221.0 lb

## 2012-06-19 DIAGNOSIS — R0789 Other chest pain: Secondary | ICD-10-CM

## 2012-06-19 DIAGNOSIS — K219 Gastro-esophageal reflux disease without esophagitis: Secondary | ICD-10-CM

## 2012-06-19 MED ORDER — SUCRALFATE 1 G PO TABS: 1.0000 g | ORAL_TABLET | Freq: Four times a day (QID) | ORAL | Status: DC

## 2012-06-19 NOTE — Progress Notes (Signed)
  Subjective:    Patient ID: Nicole Mata, female    DOB: January 21, 1961, 51 y.o.   MRN: [id number]  HPI Patient has been having this same problem with atypical chest pain for 7 months. She recently went to ER on 7/4. She had a full cardaic work up and they thought it was GI problems. She sees Dr. Dickie La and has had a long hx of GI issue with IBS, Hital hernia, H.pylori infec.She followed up with Dr. Linford Arnold after hospital follow up and she started her on Dexilant and Zantac at night. Has appt with Dr. Dickie La 6/24. She denies this chest tightness to be related to anxiety. She denies any SOB.      Review of Systems     Objective:   Physical Exam  Constitutional: She is oriented to person, place, and time. She appears well-developed and well-nourished.       Obese.  HENT:  Head: Normocephalic and atraumatic.  Eyes: Conjunctivae are normal.  Cardiovascular: Normal rate, regular rhythm and normal heart sounds.   Pulmonary/Chest: Effort normal and breath sounds normal.  Abdominal: Soft.       Some mild tenderness to palpation in the epigastric region and Left upper quadrant.  Neurological: She is alert and oriented to person, place, and time.  Skin: Skin is warm and dry.  Psychiatric: She has a normal mood and affect. Her behavior is normal.          Assessment & Plan:  Atypical chest pain/GERD- EKG today NSR no specific ST changes. Reassured that this is likely a GI problem and to follow up with them for next appt. Gave carafate to take 4 x a day to help. If no relief after foing to GI doctor we could consider a stress test.

## 2012-06-19 NOTE — Patient Instructions (Addendum)
Start Carafate 4x a day. Appt on the 24th with GI.

## 2012-06-21 ENCOUNTER — Ambulatory Visit: Payer: BC Managed Care – PPO | Admitting: Family Medicine

## 2012-06-25 ENCOUNTER — Encounter: Payer: Self-pay | Admitting: *Deleted

## 2012-06-26 ENCOUNTER — Ambulatory Visit (INDEPENDENT_AMBULATORY_CARE_PROVIDER_SITE_OTHER): Payer: BC Managed Care – PPO | Admitting: Internal Medicine

## 2012-06-26 ENCOUNTER — Encounter: Payer: Self-pay | Admitting: Internal Medicine

## 2012-06-26 VITALS — BP 122/64 | HR 78 | Ht 68.0 in | Wt 221.0 lb

## 2012-06-26 DIAGNOSIS — K5289 Other specified noninfective gastroenteritis and colitis: Secondary | ICD-10-CM

## 2012-06-26 DIAGNOSIS — K219 Gastro-esophageal reflux disease without esophagitis: Secondary | ICD-10-CM

## 2012-06-26 DIAGNOSIS — K529 Noninfective gastroenteritis and colitis, unspecified: Secondary | ICD-10-CM

## 2012-06-26 DIAGNOSIS — R933 Abnormal findings on diagnostic imaging of other parts of digestive tract: Secondary | ICD-10-CM

## 2012-06-26 MED ORDER — DEXLANSOPRAZOLE 60 MG PO CPDR: 60.0000 mg | DELAYED_RELEASE_CAPSULE | Freq: Two times a day (BID) | ORAL | Status: DC

## 2012-06-26 NOTE — Progress Notes (Signed)
Nicole Mata 10-22-1961 MRN [id number]  History of Present Illness:  This is a 51 year old white female who is post hospitalization for acute abdominal pain and abnormal CT scan on 06/07/2012 in ER, consistent with acute ileitis. The distal ileum  appeared to be thickened with fat stranding and free fluid surrounding the distal small bowel. There was also an anterior abdominal wall hernia with fat and minimal stranding The etiology was not clear. Infectious, inflammatory or ischemic causes have been considered. She is now well and is back to her baseline. She was discharged 3 weeks ago on a liquid diet and she has been able to advance to a regular diet. Her stool lactoferrin was positive. The episode occurred rather suddenly 2 days after she was started on a new blood pressure medication. We saw her for gastroesophageal reflux disease in the past. She had endoscopies in 2003 and most recently in June 2010.Her last colonoscopy in October 2008 was normal. She had a prior laparoscopic cholecystectomy. Her gastroesophageal reflux has been refractory to Dexilant 60 mg daily.   Past Medical History  Diagnosis Date  . Hiatal hernia   . IBS (irritable bowel syndrome)   . GERD (gastroesophageal reflux disease)   . Helicobacter pylori gastritis   . Gastropathy     reactive  . Diverticulosis   . Diabetes mellitus   . DJD (degenerative joint disease)   . Ileitis    Past Surgical History  Procedure Date  . Shoulder surgery 2002, 2003    bilateral for bone spurs and torn RC  . Cholecystectomy 2001    reports that she has never smoked. She has never used smokeless tobacco. She reports that she does not drink alcohol or use illicit drugs. family history includes Diabetes in her brother, maternal grandmother, and maternal uncle; Diabetes type II in her mother; Heart attack (age of onset:50) in her father; Heart disease in an unspecified family member; Hyperlipidemia in her father and mother; Hypertension  in her father and mother; and Ovarian cancer in her maternal aunts.  There is no history of Colon cancer. Allergies  Allergen Reactions  . Penicillins Anaphylaxis  . Sulfa Drugs Cross Reactors Anaphylaxis  . Contrast Media (Iodinated Diagnostic Agents) Hives  . Morphine And Related         Review of Systems: Denies abdominal pain. Positive for occasional diarrhea  The remainder of the 10 point ROS is negative except as outlined in H&P   Physical Exam: General appearance  Well developed, in no distress. Eyes- non icteric. HEENT nontraumatic, normocephalic. Mouth no lesions, tongue papillated, no cheilosis. Neck supple without adenopathy, thyroid not enlarged, no carotid bruits, no JVD. Lungs Clear to auscultation bilaterally. Cor normal S1, normal S2, regular rhythm, no murmur,  quiet precordium. Abdomen: Obese soft with minimal tenderness in lower abdomen. No rebound. Normal active bowel sounds. Rectal: Not done. Extremities trace pedal edema. Skin no lesions. Neurological alert and oriented x 3. Psychological normal mood and affect.  Assessment and Plan:  Problem #1 Post hospitalization for acute abdominal pain consistent with an acute inflammatory process in the distal small bowel of ischemic etiology which maybe related to antihypertensive medications.which were started shortly prior to the acute episode. Another explanation for the pain could be  acute gastroenteritis. There was no evidence of mechanical obstruction. There was a small anterior wall hernia with fat in it  but there were no incarcerated  loops of the bowel . She is doing better and there is no need for  a followup CT scan. If her symptoms recur, I would consider a colonoscopy and small bowel capsule endoscopy.  Problem #2 Gastroesophageal reflux disease exacerbated by a recent hospitalization. She will increase her PPI to twice a day temporarily until her reflux is under control.   06/26/2012 Nicole Mata

## 2012-06-26 NOTE — Patient Instructions (Addendum)
We have sent the following medications to your pharmacy for you to pick up at your convenience: Dexilant CC: Dr Linford Arnold

## 2012-07-08 ENCOUNTER — Other Ambulatory Visit: Payer: Self-pay | Admitting: *Deleted

## 2012-07-08 DIAGNOSIS — N951 Menopausal and female climacteric states: Secondary | ICD-10-CM

## 2012-07-08 MED ORDER — NORETHINDRONE ACET-ETHINYL EST 1-20 MG-MCG PO TABS: 1.0000 | ORAL_TABLET | Freq: Every day | ORAL | Status: DC

## 2012-07-08 NOTE — Telephone Encounter (Signed)
Pt called requesting a RF on her birth control pills.  She said she was given samples by Dr Penne Lash and needs some additional ones.  Per Dr Bertram Denver note she originally had ordered the pt to get a years worth.  I did send a RF to [facility] @ [employer].

## 2012-07-10 ENCOUNTER — Encounter: Payer: Self-pay | Admitting: Physician Assistant

## 2012-07-10 ENCOUNTER — Ambulatory Visit (INDEPENDENT_AMBULATORY_CARE_PROVIDER_SITE_OTHER): Payer: BC Managed Care – PPO | Admitting: Physician Assistant

## 2012-07-10 VITALS — BP 126/78 | HR 79 | Temp 98.1°F | Ht 68.0 in | Wt 223.0 lb

## 2012-07-10 DIAGNOSIS — J329 Chronic sinusitis, unspecified: Secondary | ICD-10-CM

## 2012-07-10 MED ORDER — MELOXICAM 7.5 MG PO TABS: 7.5000 mg | ORAL_TABLET | Freq: Every day | ORAL | Status: DC

## 2012-07-10 MED ORDER — DOXYCYCLINE HYCLATE 100 MG PO TABS: 100.0000 mg | ORAL_TABLET | Freq: Two times a day (BID) | ORAL | Status: AC

## 2012-07-10 NOTE — Progress Notes (Signed)
  Subjective:    Patient ID: Nicole Mata, female    DOB: 1961/06/16, 51 y.o.   MRN: [id number]  Sinusitis This is a new problem. The current episode started 1 to 4 weeks ago. The problem has been gradually worsening since onset. There has been no fever. Her pain is at a severity of 5/10. The pain is moderate. Associated symptoms include congestion, coughing, ear pain, headaches and sinus pressure. Pertinent negatives include no chills, diaphoresis, hoarse voice, neck pain, shortness of breath, sneezing, sore throat or swollen glands. (Lots of teeth pain. Went to dentist and stated that there was nothing dental for him to fix.) Past treatments include saline sprays. The treatment provided no relief.      Review of Systems  Constitutional: Negative for chills and diaphoresis.  HENT: Positive for ear pain, congestion and sinus pressure. Negative for sore throat, hoarse voice, sneezing and neck pain.   Respiratory: Positive for cough. Negative for shortness of breath.   Neurological: Positive for headaches.       Objective:   Physical Exam  Constitutional: She is oriented to person, place, and time. She appears well-developed and well-nourished.  HENT:  Head: Normocephalic and atraumatic.  Right Ear: External ear normal.  Left Ear: External ear normal.  Nose: Nose normal.  Mouth/Throat: Oropharynx is clear and moist. No oropharyngeal exudate.       TM's normal bilaterally. Maxillary tenderness bilaterally to palpitation. No frontal sinus tenderness.   Eyes: Conjunctivae are normal.  Neck: Normal range of motion. Neck supple.  Cardiovascular: Normal rate, regular rhythm and normal heart sounds.   Pulmonary/Chest: Effort normal and breath sounds normal. She has no wheezes.  Lymphadenopathy:    She has no cervical adenopathy.  Neurological: She is alert and oriented to person, place, and time.  Skin: Skin is warm and dry.  Psychiatric: She has a normal mood and affect. Her behavior is  normal.          Assessment & Plan:  Sinusitis- I encouraged patient to try Mucinex D twice a day for next 3 days and tylenol for pain control. Patient has a lot of allergies to abx. She reports that Zpak makes her feel funny and out of control. Gave Doxy for 10 days. Told her to try symptomatic care since right at the line of treatment or not. If she continues to worsen then start abx but make sure you finish all of it.

## 2012-07-10 NOTE — Patient Instructions (Addendum)
Mucinex D twice a day. Drink water. Tylenol or Motrin for pain. Use doxy if continue to get worse.   Sinusitis Sinuses are air pockets within the bones of your face. The growth of bacteria within a sinus leads to infection. The infection prevents the sinuses from draining. This infection is called sinusitis. SYMPTOMS  There will be different areas of pain depending on which sinuses have become infected.  The maxillary sinuses often produce pain beneath the eyes.   Frontal sinusitis may cause pain in the middle of the forehead and above the eyes.  Other problems (symptoms) include:  Toothaches.   Colored, pus-like (purulent) drainage from the nose.   Swelling, warmth, and tenderness over the sinus areas may be signs of infection.  TREATMENT  Sinusitis is most often determined by an exam.X-rays may be taken. If x-rays have been taken, make sure you obtain your results or find out how you are to obtain them. Your caregiver may give you medications (antibiotics). These are medications that will help kill the bacteria causing the infection. You may also be given a medication (decongestant) that helps to reduce sinus swelling.  HOME CARE INSTRUCTIONS   Only take over-the-counter or prescription medicines for pain, discomfort, or fever as directed by your caregiver.   Drink extra fluids. Fluids help thin the mucus so your sinuses can drain more easily.   Applying either moist heat or ice packs to the sinus areas may help relieve discomfort.   Use saline nasal sprays to help moisten your sinuses. The sprays can be found at your local drugstore.  SEEK IMMEDIATE MEDICAL CARE IF:  You have a fever.   You have increasing pain, severe headaches, or toothache.   You have nausea, vomiting, or drowsiness.   You develop unusual swelling around the face or trouble seeing.  MAKE SURE YOU:   Understand these instructions.   Will watch your condition.   Will get help right away if you are not  doing well or get worse.  Document Released: 11/20/2005 Document Revised: 11/09/2011 Document Reviewed: 06/19/2007 [facility] Patient Information 2012 [place], [place].

## 2012-07-12 ENCOUNTER — Telehealth: Payer: Self-pay | Admitting: *Deleted

## 2012-07-12 NOTE — Telephone Encounter (Signed)
Pt informed that until she has 6-12 months of no period she is to continue on the OCP's.  If she goes 6 months without a period will do [facility].  She is not to use the patch at this time.  This info was left on her cell phone voice mail

## 2012-07-16 ENCOUNTER — Ambulatory Visit: Payer: BC Managed Care – PPO | Admitting: Internal Medicine

## 2012-08-06 ENCOUNTER — Telehealth: Payer: Self-pay | Admitting: *Deleted

## 2012-08-06 DIAGNOSIS — M79671 Pain in right foot: Secondary | ICD-10-CM

## 2012-08-06 DIAGNOSIS — M79672 Pain in left foot: Secondary | ICD-10-CM

## 2012-08-06 NOTE — Telephone Encounter (Signed)
Pt informed

## 2012-08-06 NOTE — Telephone Encounter (Signed)
Pt states you seen her for foot pain and LE swelling. States she is still having this going on for about 2 months. States you mentioned about referring her to specialist. Please advise.

## 2012-08-06 NOTE — Telephone Encounter (Signed)
Okay. Will refer to podiatry.

## 2012-08-08 ENCOUNTER — Ambulatory Visit: Payer: BC Managed Care – PPO | Admitting: Family Medicine

## 2012-09-12 ENCOUNTER — Encounter: Payer: Self-pay | Admitting: Family Medicine

## 2012-09-12 ENCOUNTER — Ambulatory Visit (INDEPENDENT_AMBULATORY_CARE_PROVIDER_SITE_OTHER): Payer: BC Managed Care – PPO | Admitting: Family Medicine

## 2012-09-12 VITALS — BP 135/84 | HR 89 | Ht 68.0 in | Wt 222.0 lb

## 2012-09-12 DIAGNOSIS — G8929 Other chronic pain: Secondary | ICD-10-CM

## 2012-09-12 DIAGNOSIS — J329 Chronic sinusitis, unspecified: Secondary | ICD-10-CM

## 2012-09-12 DIAGNOSIS — M25519 Pain in unspecified shoulder: Secondary | ICD-10-CM

## 2012-09-12 DIAGNOSIS — Z23 Encounter for immunization: Secondary | ICD-10-CM

## 2012-09-12 MED ORDER — LEVOFLOXACIN 500 MG PO TABS: 500.0000 mg | ORAL_TABLET | Freq: Every day | ORAL | Status: DC

## 2012-09-12 NOTE — Progress Notes (Signed)
  Subjective:    Patient ID: Nicole Mata, female    DOB: 06-29-1961, 51 y.o.   MRN: [id number]  HPI Sinsutis - She did complete the doxy Nicole Mata, Nicole Mata gaver her and she did feel some better but now wose again. She is Using miucinex without relief.  No fever.Has a lot of phlegm and drianage.    Pain under the left shoulder pain x 6 months.  Will get sharp pain around to her axilla and then into her chest. Says has been going to the chiropractor once a week and it helps.  Has restarted her mobic - helps some.  No injuries. Has has surgery on both shoulders.     Review of Systems     Objective:   Physical Exam  Constitutional: She is oriented to person, place, and time. She appears well-developed and well-nourished.  HENT:  Head: Normocephalic and atraumatic.  Right Ear: External ear normal.  Left Ear: External ear normal.  Nose: Nose normal.  Mouth/Throat: Oropharynx is clear and moist.       TMs and canals are clear.   Eyes: Conjunctivae normal and EOM are normal. Pupils are equal, round, and reactive to light.  Neck: Neck supple. No thyromegaly present.  Cardiovascular: Normal rate, regular rhythm and normal heart sounds.   Pulmonary/Chest: Effort normal and breath sounds normal. She has no wheezes.  Musculoskeletal:       Pain just under the left scapula. , muscle fell tight.  Shoulders with NROM.  Weakness with liftoff on her lower back.  Non tender over the thoracid spine or paraspinous muscles.   Lymphadenopathy:    She has no cervical adenopathy.  Neurological: She is alert and oriented to person, place, and time.  Skin: Skin is warm and dry.  Psychiatric: She has a normal mood and affect.          Assessment & Plan:  Sinusitis- Not resolved.  We'll start Levaquin x7 days. If she's not significantly better in one week then please call the office back. I also recommend a trial of antihistamine such as Claritin or Zyrtec. As per this could have an allergic component.  Pain  under the left shoulder - I. really think she's doing on the right things. She's taking her MOBIC which does seem to help. She's been seeing her chiropractor regularly which also provide some relief. She's thinking about getting back and her with her massage therapist has been helpful for her in the past. And she does have a history of shoulder problems and certainly this could be coming from her shoulder or could be coming from the thoracic spine. I would actually like her to see Dr. Benjamin Mata for further evaluation and treatment. Certainly physical therapy is an option, she is aleady been doing some home exercises   Flu vaccine given.

## 2012-09-17 ENCOUNTER — Ambulatory Visit (INDEPENDENT_AMBULATORY_CARE_PROVIDER_SITE_OTHER): Payer: BC Managed Care – PPO | Admitting: Sports Medicine

## 2012-09-17 ENCOUNTER — Encounter: Payer: Self-pay | Admitting: Sports Medicine

## 2012-09-17 VITALS — BP 137/90 | HR 84 | Wt 219.0 lb

## 2012-09-17 DIAGNOSIS — M5412 Radiculopathy, cervical region: Secondary | ICD-10-CM

## 2012-09-17 NOTE — Progress Notes (Addendum)
Subjective:    I'm seeing this patient as a consultation for:  Dr. Linford Arnold  CC: Left periscapular pain  HPI: Nicole Mata is a very pleasant 51 year old female comes in with an approximately 7 month history of pain that she localizes around the border of her scapula, radiating down her arm but not to her fingertips. She describes this as a dull ache it does not wake her up at night. She has already been through several weeks of formal physical therapy, as well as chiropractic care, and is only 50% better since the beginning of her symptoms. She does have a cervical spine MRI in her chart.  Of note, she has had bilateral shoulder arthroscopies with acromioplasty's, however the pain relief from these procedures did not relieve the pain from her neck going down into her arm. She denies any bowel, or bladder changes, or lower extremity symptoms.  Past medical history, Surgical history, Family history, Social history, Allergies, and medications have been entered into the medical record, reviewed, and no changes needed.   Review of Systems: No headache, visual changes, nausea, vomiting, diarrhea, constipation, dizziness, abdominal pain, skin rash, fevers, chills, night sweats, weight loss, swollen lymph nodes, body aches, joint swelling, muscle aches, chest pain, or shortness of breath.   Objective:   Vitals:  Afebrile, vital signs stable. General: Well Developed, well nourished, and in no acute distress.  Neuro/Psych: Alert and oriented x3, extra-ocular muscles intact, able to move all 4 extremities.  Skin: Warm and dry, no rashes noted.  Respiratory: Not using accessory muscles, speaking in full sentences, trachea midline.  Cardiovascular: Pulses palpable, no extremity edema. Abdomen: Does not appear distended. Neck: Inspection unremarkable. No palpable stepoffs. Negative Spurling's maneuver. Full neck range of motion Grip strength and sensation normal in bilateral hands Strength is mildly weak in  the left triceps suggestive of left C7 nerve root dysfunction. No sensory change to C4 to T1 Negative Hoffman sign bilaterally Reflexes normal  I did personally review her cervical spine MRI, there are multilevel disc protrusions, however this is the worst at the C6-C7 level with a left-sided foraminal disc that appears to be compressing the exiting left C7 nerve root.  I have discussed this with the radiologist, and we both agree that this is a left-sided C6-C7 foraminal disc compressing the exiting left C7 nerve root, and at the exiting left C6 nerve root appears unharmed.  Impression and Recommendations:   This case required medical decision making of moderate complexity.

## 2012-09-17 NOTE — Assessment & Plan Note (Addendum)
MRI suggests a left-sided foraminal disc at the C6-C7 level. This does appear to affect the exiting C7 nerve root. She has already been through formal physical therapy, oral medications, as well as traction. Her response is insufficient, and her symptoms as well as isolated triceps muscle  weakness is suggestive. I am going to send her for a cervical (ideally transforaminal), but okay for interlaminar epidural steroid injection at the C6-C7 level. I will see her back after injections.

## 2012-09-26 ENCOUNTER — Other Ambulatory Visit: Payer: BC Managed Care – PPO

## 2012-09-27 ENCOUNTER — Ambulatory Visit
Admission: RE | Admit: 2012-09-27 | Discharge: 2012-09-27 | Disposition: A | Discharge status: Home / Self Care | Payer: BC Managed Care – PPO | Source: Ambulatory Visit | Attending: Sports Medicine | Admitting: Sports Medicine

## 2012-09-27 ENCOUNTER — Other Ambulatory Visit: Payer: Self-pay | Admitting: Sports Medicine

## 2012-09-27 VITALS — BP 149/76 | HR 91

## 2012-09-27 DIAGNOSIS — M5412 Radiculopathy, cervical region: Secondary | ICD-10-CM

## 2012-09-27 MED ORDER — IOHEXOL 300 MG/ML  SOLN
1.0000 mL | Freq: Once | INTRAMUSCULAR | Status: AC | PRN
  Administered 2012-09-27: 1 mL via EPIDURAL

## 2012-09-27 MED ORDER — TRIAMCINOLONE ACETONIDE 40 MG/ML IJ SUSP (RADIOLOGY)
60.0000 mg | Freq: Once | INTRAMUSCULAR | Status: AC
  Administered 2012-09-27: 60 mg via EPIDURAL

## 2012-10-04 ENCOUNTER — Ambulatory Visit (INDEPENDENT_AMBULATORY_CARE_PROVIDER_SITE_OTHER): Payer: BC Managed Care – PPO | Admitting: Sports Medicine

## 2012-10-04 ENCOUNTER — Encounter: Payer: Self-pay | Admitting: Sports Medicine

## 2012-10-04 VITALS — BP 126/79 | HR 81 | Wt 233.0 lb

## 2012-10-04 DIAGNOSIS — M5412 Radiculopathy, cervical region: Secondary | ICD-10-CM

## 2012-10-04 NOTE — Progress Notes (Signed)
Subjective:    CC: Followup  HPI: Nicole Mata has left-sided C7 cervical radiculitis, and has failed conservative therapy. She's had her first epidural, interlaminar steroid injection which was placed at the C7-T1 level.  She's noted about 60% improvement after the epidural injections, but lasted for several days. Her pain did return. The numbness and tingling in her finger, as well as the aching pain going down into her anterior chest and around her scapula were also resolved after the injection.  Past medical history, Surgical history, Family history, Social history, Allergies, and medications have been entered into the medical record, reviewed, and no changes needed.   Review of Systems: No headache, visual changes, nausea, vomiting, diarrhea, constipation, dizziness, abdominal pain, skin rash, fevers, chills, night sweats, weight loss, swollen lymph nodes, body aches, joint swelling, muscle aches, chest pain, or shortness of breath.   Objective:   Vitals:  Afebrile, vital signs stable. General: Well Developed, well nourished, and in no acute distress.  Neuro/Psych: Alert and oriented x3, extra-ocular muscles intact, able to move all 4 extremities.  Skin: Warm and dry, no rashes noted.  Respiratory: Not using accessory muscles, speaking in full sentences, trachea midline.  Cardiovascular: Pulses palpable, no extremity edema. Abdomen: Does not appear distended. Neck: Inspection unremarkable. No palpable stepoffs. Negative Spurling's maneuver. Full neck range of motion Grip strength and sensation normal in bilateral hands Strength good C4 to T1 distribution, particularly left triceps strength has improved significantly. It is now equal to the contralateral side. No sensory change to C4 to T1 Negative Hoffman sign bilaterally Reflexes normal  Impression and Recommendations:   This case required medical decision making of moderate complexity.

## 2012-10-04 NOTE — Assessment & Plan Note (Addendum)
With 60% improvement after a single C7-T1 interlaminar epidural injection, with resolution of pain in her chest, and periscapular region during this time period, I think she would benefit from completing the series. She will call [facility] imaging for this to be done. I still would like to see her back after each injection. We did discuss surgical intervention, and how temporary improvement with an epidural injection would be duplicated a more permanent basis with surgical discectomy. We will keep this in the back of her mind, she does not desire to talk to our neck surgeon yet.

## 2012-10-07 ENCOUNTER — Telehealth: Payer: Self-pay | Admitting: Internal Medicine

## 2012-10-07 NOTE — Telephone Encounter (Signed)
Patient calling to report RLQ pain and abdomen is hard/tender to touch. She started noticing this 3 weeks ago but is getting worse. Denies nausea or vomiting. States she does have constipation and diarrhea off and on. She takes Dexilant BID. Sometimes she does not take the night time dose. States Dr. Juanda Chance told her to call us when her stomach did this again and be seen.HX acute ileitis  Scheduled with Mike Gip, PA on 10/08/12 at 11:00 AM.

## 2012-10-07 NOTE — Telephone Encounter (Signed)
I am glad she is seeing Amy, because she had an acute episode of pain  3 months ago and CT scan raised a question of incarcerated umbilical hernia or distal ileitis. I think, this time she may need a repeat CT or enterography to assess the position of the ileum in relation to the hernia.

## 2012-10-08 ENCOUNTER — Other Ambulatory Visit (INDEPENDENT_AMBULATORY_CARE_PROVIDER_SITE_OTHER): Payer: BC Managed Care – PPO

## 2012-10-08 ENCOUNTER — Encounter: Payer: Self-pay | Admitting: Physician Assistant

## 2012-10-08 ENCOUNTER — Other Ambulatory Visit: Payer: Self-pay | Admitting: Physician Assistant

## 2012-10-08 ENCOUNTER — Ambulatory Visit (INDEPENDENT_AMBULATORY_CARE_PROVIDER_SITE_OTHER): Payer: BC Managed Care – PPO | Admitting: Physician Assistant

## 2012-10-08 VITALS — BP 104/76 | HR 88 | Ht 66.0 in | Wt 222.2 lb

## 2012-10-08 DIAGNOSIS — R143 Flatulence: Secondary | ICD-10-CM

## 2012-10-08 DIAGNOSIS — R109 Unspecified abdominal pain: Secondary | ICD-10-CM

## 2012-10-08 DIAGNOSIS — K429 Umbilical hernia without obstruction or gangrene: Secondary | ICD-10-CM

## 2012-10-08 DIAGNOSIS — R141 Gas pain: Secondary | ICD-10-CM

## 2012-10-08 DIAGNOSIS — R9389 Abnormal findings on diagnostic imaging of other specified body structures: Secondary | ICD-10-CM

## 2012-10-08 DIAGNOSIS — R140 Abdominal distension (gaseous): Secondary | ICD-10-CM

## 2012-10-08 LAB — HIGH SENSITIVITY CRP: CRP, High Sensitivity: 52.28 mg/dL — ABNORMAL HIGH (ref 0.000–5.000)

## 2012-10-08 LAB — CBC WITH DIFFERENTIAL/PLATELET
Basophils Relative: 1 % (ref 0.0–3.0)
Eosinophils Relative: 0.6 % (ref 0.0–5.0)
HCT: 40 % (ref 36.0–46.0)
MCV: 91.4 fl (ref 78.0–100.0)
Monocytes Absolute: 0.9 10*3/uL (ref 0.1–1.0)
Monocytes Relative: 7 % (ref 3.0–12.0)
Neutrophils Relative %: 67.7 % (ref 43.0–77.0)
Platelets: 358 10*3/uL (ref 150.0–400.0)
RBC: 4.38 Mil/uL (ref 3.87–5.11)
WBC: 13.5 10*3/uL — ABNORMAL HIGH (ref 4.5–10.5)

## 2012-10-08 NOTE — Progress Notes (Signed)
Reviewed and totally agree with plans for CT enterography. Even if CT does not show definite incarceration, she may benefit from a surgical consult in case of emergency.

## 2012-10-08 NOTE — Patient Instructions (Addendum)
You have been scheduled for a CT scan of the abdomen and pelvis at [facility] ([address]---this is in the same building as [facility]).   You are scheduled on 10-10-2012 Thursday at 9:00 Am . You should arrive at 8:45 Am   for registration. Please follow the written instructions below on the day of your exam:  WARNING: IF YOU ARE ALLERGIC TO IODINE/X-RAY DYE, PLEASE NOTIFY RADIOLOGY IMMEDIATELY AT [phone]! YOU WILL BE GIVEN A 13 HOUR PREMEDICATION PREP.  1/ Do not eat or drink anything past 5:00 am . 2) You have been given 2 bottles of oral contrast to drink. The solution may taste    better if refrigerated, but do NOT add ice or any other liquid to this solution. Shake well before drinking.    Drink 1 bottle of contrast @ 7:00 am (2 hours prior to your exam)  Drink 1 bottle of contrast @ 8:00 am  (1 hour prior to your exam)  You may take any medications as prescribed with a small amount of water except for the following: Metformin, Glucophage, Glucovance, Avandamet, Riomet, Fortamet, Actoplus Met, Janumet, Glumetza or Metaglip. The above medications must be held the day of the exam AND 48 hours after the exam.  The purpose of you drinking the oral contrast is to aid in the visualization of your intestinal tract. The contrast solution may cause some diarrhea. Before your exam is started, you will be given a small amount of fluid to drink. Depending on your individual set of symptoms, you may also receive an intravenous injection of x-ray contrast/dye. Plan on being at [facility] for 30 minutes or long, depending on the type of exam you are having performed.  This test typically takes 30-45 minutes to complete.  If you have any questions regarding your exam or if you need to reschedule, you may call the CT department at [phone] between the hours of 8:00 am and 5:00 pm, Monday-Friday.3  Please go to the basement level to have your labs drawn.  Follow up  with Dr. [name] on 11-19-2012 at 3:30 PM.   ________________________________________________________________________

## 2012-10-08 NOTE — Progress Notes (Signed)
Subjective:    Patient ID: Nicole Mata, female    DOB: [birth year], 51 y.o.   MRN: [id number]  HPI Nicole Mata is a pleasant 51 year old white female known to Dr. Vincent Gros. She has history of IBS, and GERD. She is status post cholecystectomy. She had screening colonoscopy done in 2008 which was normal. She was hospitalized briefly in July of 2013 after acute onset of severe diffuse abdominal cramping and distention which was associated with nausea diaphoresis area She had CT scan done at that time without IV contrast there did show a thickened loop of distal ileum and a normal-appearing terminal ileum. There was noted to be interloop fluid and mesenteric fat stranding as well as some moderate free pelvic fluid. She was also found to have a fat-containing supraumbilical hernia with associated fat stranding. Her symptoms resolved with conservative management, and without a definite diagnosis. She comes back at this time stating that she's been having problems over the past 3 weeks with recurrent intermittent sharp abdominal cramping and abdominal distention at the site of the umbilical hernia. This specifically seems to be worse after meals or She also complains of generalized soreness in her abdomen and has been having some bouts of diarrhea. She's not had any nausea or vomiting, no melena or hematochezia. No fever or chills. She says her symptoms are definitely not as severe as what she had in July but similar in nature. Says that in the interim she had been doing well without any symptoms until  onset 3 weeks ago.    Review of Systems  Constitutional: Negative.   HENT: Negative.   Eyes: Negative.   Respiratory: Negative.   Cardiovascular: Negative.   Gastrointestinal: Positive for abdominal pain, diarrhea and abdominal distention.  Genitourinary: Negative.   Musculoskeletal: Negative.   Neurological: Negative.   Hematological: Negative.   Psychiatric/Behavioral: Negative.    Outpatient  Prescriptions Prior to Visit  Medication Sig Dispense Refill  . albuterol (PROVENTIL HFA;VENTOLIN HFA) 108 (90 BASE) MCG/ACT inhaler Inhale 2 puffs into the lungs every 6 (six) hours as needed. For shortness of breath or wheezing      . dexlansoprazole (DEXILANT) 60 MG capsule Take 1 capsule (60 mg total) by mouth 2 (two) times daily.  60 capsule  2  . ketotifen (ZADITOR) 0.025 % ophthalmic solution Place 1 drop into both eyes every morning.      Marland Kitchen levofloxacin (LEVAQUIN) 500 MG tablet Take 1 tablet (500 mg total) by mouth daily.  7 tablet  0  . meloxicam (MOBIC) 7.5 MG tablet Take 7.5 mg by mouth as needed.      . norethindrone-ethinyl estradiol (JUNEL 1/20) 1-20 MG-MCG tablet Take 1 tablet by mouth daily.  1 Package  7  . ranitidine (ZANTAC) 150 MG tablet Take 1 tablet (150 mg total) by mouth at bedtime.  30 tablet  10   Allergies  Allergen Reactions  . Penicillins Anaphylaxis  . Sulfa Drugs Cross Reactors Anaphylaxis  . Contrast Media (Iodinated Diagnostic Agents) Hives  . Morphine And Related     Pt chooses not to take it  . Azithromycin Other (See Comments)    Makes her feel really crazy.  Marland Kitchen Hydrocodone Nausea And Vomiting   Patient Active Problem List  Diagnosis  . GERD  . HIATAL HERNIA  . IRRITABLE BOWEL SYNDROME  . HELICOBACTER PYLORI GASTRITIS, HX OF  . Cervical radiculopathy at C7  . Obese  . Insulin resistance  . Ileitis       History  Substance Use Topics  . Smoking status: Never Smoker   . Smokeless tobacco: Never Used  . Alcohol Use: No    Objective:   Physical Exam well-developed white female in no acute distress, pleasant blood pressure 104 her 76 pulse 88 height 5 foot 6 weight 222 HEENT; nontraumatic normocephalic EOMI PERRLA sclera anicteric, Neck;Supple no JVD, Cardiovascular; regular rate and rhythm with S1-S2 no murmur gallop, Pulmonary; clear bilaterally, Abdomen; soft large, she does have a palpable supraumbilical hernia which is reducible and  currently nontender, she does not have any focal abdominal tenderness, no guarding or rebound no palpable mass or hepatosplenomegaly bowel sounds are active, Recta;l exam not done, Extremities; no clubbing cyanosis or edema skin warm dry, Psych; mood and affect normal and appropriate        Assessment & Plan:  #19 51 year old female with intermittent lower abdominal crampy pain and abdominal distention x3 weeks. These symptoms have been associated with protuberance and distention had a previously documented supraumbilical abdominal hernia. Patient had similar but much more severe symptoms in July of 2013 with CT finding of a fat-containing supraumbilical hernia with associated fat stranding and in addition a thickened loop of distal ileum with interloop fluid and mesenteric fat stranding. Will need to rule out intermittent incarceration of the ileum in the umbilical hernia, and or spigelian hernia, versus other acute inflammatory process of the distal small bowel.  #2 GERD stable Plan; trial of Bentyl 10 mg twice daily as needed for cramping Schedule for CT abdomen with  enterography this week. Further plans pending findings on CT Will check CBC with diff and CRP today

## 2012-10-10 ENCOUNTER — Inpatient Hospital Stay: Admission: RE | Admit: 2012-10-10 | Payer: BC Managed Care – PPO | Source: Ambulatory Visit

## 2012-10-11 ENCOUNTER — Telehealth: Payer: Self-pay | Admitting: *Deleted

## 2012-10-11 ENCOUNTER — Ambulatory Visit (INDEPENDENT_AMBULATORY_CARE_PROVIDER_SITE_OTHER)
Admission: RE | Admit: 2012-10-11 | Discharge: 2012-10-11 | Disposition: A | Discharge status: Home / Self Care | Payer: BC Managed Care – PPO | Source: Ambulatory Visit | Attending: Physician Assistant | Admitting: Physician Assistant

## 2012-10-11 DIAGNOSIS — R142 Eructation: Secondary | ICD-10-CM

## 2012-10-11 DIAGNOSIS — K561 Intussusception: Secondary | ICD-10-CM

## 2012-10-11 DIAGNOSIS — R140 Abdominal distension (gaseous): Secondary | ICD-10-CM

## 2012-10-11 DIAGNOSIS — R109 Unspecified abdominal pain: Secondary | ICD-10-CM

## 2012-10-11 DIAGNOSIS — R141 Gas pain: Secondary | ICD-10-CM

## 2012-10-11 MED ORDER — DICYCLOMINE HCL 10 MG PO CAPS: ORAL_CAPSULE | ORAL | Status: DC

## 2012-10-11 NOTE — Telephone Encounter (Signed)
Surgeons often feel that finding of intussusception  is often incidental and comes and goes especially if there is no bowl dilation. She ought to have a SBFT. I would keep the surgical appointment but I am not sure we have an answer. Regarding her abdominal pain.

## 2012-10-11 NOTE — Telephone Encounter (Signed)
thanks

## 2012-10-11 NOTE — Telephone Encounter (Signed)
Patient calling because the rx that was to be sent after OV is not at her pharmacy. States she had her CT today and would like results also if possible. Spoke with Mike Gip, PA and the CT shows intussusception- not a hernia problem. Has been sent to Dr. Juanda Chance to review also. Will need surgical referral. ?worse pain or vomiting? Spoke with patient and she states she drank the contrast for CT and has had more cramping and diarrhea since drinking it. She has passed gas and ate oatmeal. No vomiting. Scheduled patient with Dr. Luisa Hart on 10/14/12 1:50/2:20 PM. Bentyl 10 mg po BID sent to pharmacy. Patient understands to go to ED if she has increase in pain or vomiting.

## 2012-10-14 ENCOUNTER — Ambulatory Visit (INDEPENDENT_AMBULATORY_CARE_PROVIDER_SITE_OTHER): Payer: BC Managed Care – PPO | Admitting: Surgery

## 2012-10-14 ENCOUNTER — Encounter (INDEPENDENT_AMBULATORY_CARE_PROVIDER_SITE_OTHER): Payer: Self-pay | Admitting: Surgery

## 2012-10-14 ENCOUNTER — Other Ambulatory Visit: Payer: Self-pay | Admitting: Internal Medicine

## 2012-10-14 VITALS — BP 132/86 | HR 72 | Temp 97.8°F | Resp 14 | Ht 66.0 in | Wt 220.4 lb

## 2012-10-14 DIAGNOSIS — K429 Umbilical hernia without obstruction or gangrene: Secondary | ICD-10-CM

## 2012-10-14 DIAGNOSIS — K639 Disease of intestine, unspecified: Secondary | ICD-10-CM

## 2012-10-14 NOTE — Telephone Encounter (Signed)
Pt has appt with [facility] surgeon today. Will hold off on SBFT until we hear from what [facility] tell pt per Mike Gip PA.

## 2012-10-14 NOTE — Patient Instructions (Signed)
Laparoscopic Bowel Resection Laparoscopic bowel resection is used to remove a piece of large or small intestine that may be red, sore, or swollen (inflamed) or to remove a portion of bowel that is blocked. LET YOUR CAREGIVER KNOW ABOUT:   Allergies to food or medicine.  Medicines taken, including vitamins, herbs, eyedrops, over-the-counter medicines, and creams.  Use of steroids (by mouth or creams).  Previous problems with anesthetics or numbing medicines.  History of bleeding problems or blood clots.  Previous surgery.  Other health problems, including diabetes and kidney problems.  Possibility of pregnancy, if this applies. RISKS AND COMPLICATIONS   Infection.  Bleeding.  Injury to other organs.  Anesthetic side effects.  Leakage from where the bowel is put back together (anastomosis).  Long delay before return of bowel function (ileus). BEFORE THE PROCEDURE   You should be present 60 minutes before your procedure or as directed by your caregiver. PROCEDURE  Laparoscopic means that the procedure is done with a thin, lighted tube (laparoscope). Once you are given medicine that makes you sleep (general anesthetic), your surgeon inflates your abdomen with carbon dioxide gas. The laparoscope is put into your abdomen through a small cut (incision). This allows your surgeon to see into the abdomen. A video camera is attached to the laparoscope to enlarge the view. The surgeon sees this image on a monitor. During the procedure, the portion of bowel to be removed is taken out through one of the incisions. The incision may need to be enlarged if the bowel is too large to be removed through one of the smaller incisions. In this case, a small incision will be made and sometimes the bowel repair is made outside the abdomen. AFTER THE PROCEDURE  If there are no problems, recovery time is brief compared to regular surgery. You will rest in a recovery room until you are stable and doing  well. Following this, if you have no other problems, you will be allowed to return to your room. Recovery time varies depending on what is found during surgery, your age, and your general health. Sometimes, it takes a few days for bowel function to return (passing gas, bowel movements). You will stay in the hospital until bowel function returns. Sometimes, a tube is placed in your stomach, through your nose (nasogastric tube). This tube is used to release pressure from your stomach (decompress) and to decrease nausea until bowel function returns. Document Released: 05/16/2001 Document Revised: 05/21/2012 Document Reviewed: 04/11/2010 [facility] Patient Information 2013 [place], [place].  Hernia Repair with Laparoscope A hernia occurs when an internal organ pushes out through a weak spot in the belly (abdominal) wall muscles. Hernias most commonly occur in the groin and around the navel. Hernias can also occur through a cut by the surgeon (incision) after an abdominal operation. A hernia may be caused by:  Lifting heavy objects.  Prolonged coughing.  Straining to move your bowels. Hernias can often be pushed back into place (reduced). Most hernias tend to get worse over time. Problems occur when abdominal contents get stuck in the opening and the blood supply is blocked or impaired (incarcerated hernia). Because of these risks, you require surgery to repair the hernia. Your hernia will be repaired using a laparoscope. Laparoscopic surgery is a type of minimally invasive surgery. It does not involve making a typical surgical cut (incision) in the skin. A laparoscope is a telescope-like rod and lens system. It is usually connected to a video camera and a light source so your  caregiver can clearly see the operative area. The instruments are inserted through  to  inch (5 mm or 10 mm) openings in the skin at specific locations. A working and viewing space is created by blowing a small amount of carbon dioxide  gas into the abdominal cavity. The abdomen is essentially blown up like a balloon (insufflated). This elevates the abdominal wall above the internal organs like a dome. The carbon dioxide gas is common to the human body and can be absorbed by tissue and removed by the respiratory system. Once the repair is completed, the small incisions will be closed with either stitches (sutures) or staples (just like a paper stapler only this staple holds the skin together). LET YOUR CAREGIVERS KNOW ABOUT:  Allergies.  Medications taken including herbs, eye drops, over the counter medications, and creams.  Use of steroids (by mouth or creams).  Previous problems with anesthetics or Novocaine.  Possibility of pregnancy, if this applies.  History of blood clots (thrombophlebitis).  History of bleeding or blood problems.  Previous surgery.  Other health problems. BEFORE THE PROCEDURE  Laparoscopy can be done either in a hospital or out-patient clinic. You may be given a mild sedative to help you relax before the procedure. Once in the operating room, you will be given a general anesthesia to make you sleep (unless you and your caregiver choose a different anesthetic).  AFTER THE PROCEDURE  After the procedure you will be watched in a recovery area. Depending on what type of hernia was repaired, you might be admitted to the hospital or you might go home the same day. With this procedure you may have less pain and scarring. This usually results in a quicker recovery and less risk of infection. HOME CARE INSTRUCTIONS   Bed rest is not required. You may continue your normal activities but avoid heavy lifting (more than 10 pounds) or straining.  Cough gently. If you are a smoker it is best to stop, as even the best hernia repair can break down with the continual strain of coughing.  Avoid driving until given the OK by your surgeon.  There are no dietary restrictions unless given otherwise.  TAKE ALL  MEDICATIONS AS DIRECTED.  Only take over-the-counter or prescription medicines for pain, discomfort, or fever as directed by your caregiver. SEEK MEDICAL CARE IF:   There is increasing abdominal pain or pain in your incisions.  There is more bleeding from incisions, other than minimal spotting.  You feel light headed or faint.  You develop an unexplained fever, chills, and/or an oral temperature above 102 F (38.9 C).  You have redness, swelling, or increasing pain in the wound.  Pus coming from wound.  A foul smell coming from the wound or dressings. SEEK IMMEDIATE MEDICAL CARE IF:   You develop a rash.  You have difficulty breathing.  You have any allergic problems. MAKE SURE YOU:   Understand these instructions.  Will watch your condition.  Will get help right away if you are not doing well or get worse. Document Released: 11/20/2005 Document Revised: 02/12/2012 Document Reviewed: 10/20/2009 [facility] Patient Information 2013 [place], [place].      OUT OF WORK UP TO 4 WEEKS .  Can work from home if able.

## 2012-10-14 NOTE — Progress Notes (Signed)
Patient ID: Nicole Mata, female   DOB: [birth year], 51 y.o.   MRN: [id number]  No chief complaint on file.   HPI Nicole Mata is a 51 y.o. female.  Patient sent at the request of Dr. Dickie La due to a long-standing history of chronic abdominal pain. She has a history of intermittent diffuse abdominal pain and was hospitalized in July 2013 with what appeared to be thickened proximal jejunum and terminal ileum with umbilical hernia. There is some stranding in the mesentery and she had an elevated white count. This resolved with medical treatment. She continues to have intermittent crampy abdominal pain. It comes and goes. There is some associated nausea with it. CT scan showed thickened jejunum with question of intussusception and a periumbilical hernia. She is asymptomatic currently. Denies any diarrhea constipation. HPI  Past Medical History  Diagnosis Date  . Hiatal hernia   . IBS (irritable bowel syndrome)   . GERD (gastroesophageal reflux disease)   . Helicobacter pylori gastritis   . Gastropathy     reactive  . Diverticulosis   . Diabetes mellitus   . DJD (degenerative joint disease)   . Ileitis     Past Surgical History  Procedure Date  . Shoulder surgery 2002, 2003    bilateral for bone spurs and torn RC  . Cholecystectomy 2001    Family History  Problem Relation Age of Onset  . Heart disease      family disease  . Heart attack Father 50    grandparents  . Hypertension Father   . Hyperlipidemia Father   . Diabetes Maternal Grandmother   . Diabetes Maternal Uncle   . Ovarian cancer Maternal Aunt     x 2  . Hypertension Mother   . Hyperlipidemia Mother   . Diabetes Brother   . Colon cancer Neg Hx   . Ovarian cancer Maternal Aunt   . Diabetes type II Mother     Social History History  Substance Use Topics  . Smoking status: Never Smoker   . Smokeless tobacco: Never Used  . Alcohol Use: No    Allergies  Allergen Reactions  . Penicillins Anaphylaxis  .  Sulfa Drugs Cross Reactors Anaphylaxis  . Contrast Media (Iodinated Diagnostic Agents) Hives  . Morphine And Related     Pt chooses not to take it  . Azithromycin Other (See Comments)    Makes her feel really crazy.  Marland Kitchen Hydrocodone Nausea And Vomiting    Current Outpatient Prescriptions  Medication Sig Dispense Refill  . albuterol (PROVENTIL HFA;VENTOLIN HFA) 108 (90 BASE) MCG/ACT inhaler Inhale 2 puffs into the lungs every 6 (six) hours as needed. For shortness of breath or wheezing      . dexlansoprazole (DEXILANT) 60 MG capsule Take 1 capsule (60 mg total) by mouth 2 (two) times daily.  60 capsule  2  . dicyclomine (BENTYL) 10 MG capsule Take one po BID  20 capsule  0  . ketotifen (ZADITOR) 0.025 % ophthalmic solution Place 1 drop into both eyes every morning.      Marland Kitchen levofloxacin (LEVAQUIN) 500 MG tablet Take 1 tablet (500 mg total) by mouth daily.  7 tablet  0  . meloxicam (MOBIC) 7.5 MG tablet Take 7.5 mg by mouth as needed.      . norethindrone-ethinyl estradiol (JUNEL 1/20) 1-20 MG-MCG tablet Take 1 tablet by mouth daily.  1 Package  7  . ranitidine (ZANTAC) 150 MG tablet Take 1 tablet (150 mg total) by mouth at  bedtime.  30 tablet  10    Review of Systems Review of Systems  Constitutional: Negative for fever, chills and unexpected weight change.  HENT: Negative for hearing loss, congestion, sore throat, trouble swallowing and voice change.   Eyes: Negative for visual disturbance.  Respiratory: Negative for cough and wheezing.   Cardiovascular: Negative for chest pain, palpitations and leg swelling.  Gastrointestinal: Positive for abdominal pain, diarrhea and constipation. Negative for nausea, vomiting, blood in stool, abdominal distention and anal bleeding.  Genitourinary: Negative for hematuria, vaginal bleeding and difficulty urinating.  Musculoskeletal: Negative for arthralgias.  Skin: Negative for rash and wound.  Neurological: Negative for seizures, syncope and headaches.    Hematological: Negative for adenopathy. Does not bruise/bleed easily.  Psychiatric/Behavioral: Negative for confusion.    Blood pressure 132/86, pulse 72, temperature 97.8 F (36.6 C), resp. rate 14, height 5\' 6"  (1.676 m), weight 220 lb 6.4 oz (99.973 kg), last menstrual period 09/19/2012.  Physical Exam Physical Exam  Constitutional: She is oriented to person, place, and time. She appears well-developed and well-nourished.  HENT:  Head: Normocephalic and atraumatic.  Eyes: EOM are normal. Pupils are equal, round, and reactive to light.  Neck: Normal range of motion. Neck supple.  Cardiovascular: Normal rate and regular rhythm.   Pulmonary/Chest: Effort normal and breath sounds normal.  Abdominal: Soft. Bowel sounds are normal. She exhibits no distension. There is no tenderness. There is no rebound.  Musculoskeletal: Normal range of motion.  Neurological: She is alert and oriented to person, place, and time.  Skin: Skin is warm and dry.  Psychiatric: She has a normal mood and affect. Her behavior is normal. Judgment and thought content normal.    Data Reviewed *RADIOLOGY REPORT*  Clinical Data: Chronic abdominal pain. Variable bowel habits.  Nausea. Leukocytosis. Focal periumbilical swelling.  CT ABDOMEN AND PELVIS WITHOUT CONTRAST  Technique: Multidetector CT imaging of the abdomen and pelvis was  performed following the standard protocol without intravenous  contrast.  Comparison: 06/06/2012  Findings: The visualized portion of the liver, spleen, pancreas,  and adrenal glands appear unremarkable in noncontrast CT  appearance.  Gallbladder surgically absent. Nonobstructive new left  nephrolithiasis includes a 2 mm left mid kidney nonobstructive  calculus and a 2 mm left kidney lower pole nonobstructive calculus.  Small peripelvic cysts noted no right-sided calculus or ureteral  calculus observed.  A supraumbilical hernia contains omental adipose tissue and  measures 1.8  cm and the hernia neck and 5.2 cm in transverse  diameter. This hernia does not contain bowel. No significant  internal stranding within this supraumbilical hernia.  No pathologic retroperitoneal or porta hepatis adenopathy is  identified.  No pathologic pelvic adenopathy is identified.  Appendix unremarkable. Sigmoid diverticulosis noted without active  diverticulitis.  There is a 3.3 cm segment of proximal jejunojejunal intussusception  shown on image 53 of series 602 and also well shown on images 128-  139 of series 603, without associated small bowel dilatation.  IMPRESSION:  1. Supraumbilical hernia contains adipose tissue but not bowel.  2. 3.3 cm proximal jejunum jejunal intussusception in the left  upper quadrant, without associated small bowel dilatation. An  obvious causative lesion is not observed, and the significance is  uncertain.  3. Nonobstructive left nephrolithiasis.  4. Sigmoid diverticulosis.  Original Report Authenticated By: Gaylyn Rong, M.D.    Assessment    History of chronic abdominal pain  Periumbilical hernia  Thickened loop of proximal jejunum clinically asymptomatic currently    Plan  I have reviewed his CT scan. I compared this to her CT scan from July of 2013. The small bowel it was felt to be an intussusception appears thickened and is located in the proximal jejunum. She has no clinical signs of intussusception. She does have a fairly large periumbilical hernia. I feel laparoscopy with possible hernia repair and small bowel resection would be most useful at this point given her long-standing symptoms and CT findings are not conclusive for any particular diagnosis except umbilical hernia. I discussed the possibility of small bowel resection and anastomosis and the risks associated with that which include bleeding, infection, anastomotic leakage require further surgery, death, DVT, and further surgery. I discussed hernia repair as well and  depending on intra-abdominal findings this can be done laparoscopically. She may require an open incision this was discussed. She is very proceed. Other options were discussed which include further workup with colonoscopy and capsule endoscopy but I think the yield for these tests is low. She's ready to proceed.The procedure has been discussed with the patient.  Alternative therapies have been discussed with the patient.  Operative risks include bleeding,  Infection,  Organ injury,  Nerve injury,  Blood vessel injury,  DVT,  Pulmonary embolism,  Death,  And possible reoperation.  Medical management risks include worsening of present situation.  The success of the procedure is 50 -90 % at treating patients symptoms.  The patient understands and agrees to proceed.       Lovenia Debruler A. 10/14/2012, 2:47 PM

## 2012-10-14 NOTE — Addendum Note (Signed)
Addended by: Selinda Michaels R on: 10/14/2012 02:39 PM   Modules accepted: Orders

## 2012-10-14 NOTE — Telephone Encounter (Signed)
Regina - see Dr. Regino Schultze note- she would like pt to have a SBFT- can you please schedule.

## 2012-10-15 NOTE — Telephone Encounter (Signed)
Hold off on SBFT but keep her appointment with me so we can discuss the surgery.

## 2012-10-15 NOTE — Telephone Encounter (Signed)
Dr. Juanda Chance, this patient saw surgeon yesterday and is scheduled for surgery on 12/16/12. Does she still need the  SBFT and she is scheduled on 11/19/12 for OV with you. Does she need to keep this?

## 2012-10-16 NOTE — Telephone Encounter (Signed)
Spoke with patient and moved appointment to 11/06/12 at 3:00 PM.

## 2012-10-17 ENCOUNTER — Other Ambulatory Visit (HOSPITAL_COMMUNITY): Payer: BC Managed Care – PPO

## 2012-11-06 ENCOUNTER — Encounter: Payer: Self-pay | Admitting: Internal Medicine

## 2012-11-06 ENCOUNTER — Encounter (HOSPITAL_COMMUNITY)
Admission: RE | Admit: 2012-11-06 | Discharge: 2012-11-06 | Disposition: A | Discharge status: Home / Self Care | Payer: BC Managed Care – PPO | Source: Ambulatory Visit | Attending: Surgery | Admitting: Surgery

## 2012-11-06 ENCOUNTER — Encounter (HOSPITAL_COMMUNITY): Payer: Self-pay

## 2012-11-06 ENCOUNTER — Ambulatory Visit (INDEPENDENT_AMBULATORY_CARE_PROVIDER_SITE_OTHER): Payer: BC Managed Care – PPO | Admitting: Internal Medicine

## 2012-11-06 VITALS — BP 112/80 | HR 60 | Ht 66.0 in | Wt 220.6 lb

## 2012-11-06 DIAGNOSIS — K561 Intussusception: Secondary | ICD-10-CM

## 2012-11-06 DIAGNOSIS — K429 Umbilical hernia without obstruction or gangrene: Secondary | ICD-10-CM

## 2012-11-06 DIAGNOSIS — R933 Abnormal findings on diagnostic imaging of other parts of digestive tract: Secondary | ICD-10-CM

## 2012-11-06 HISTORY — DX: Nausea with vomiting, unspecified: R11.2

## 2012-11-06 HISTORY — DX: Other specified postprocedural states: Z98.890

## 2012-11-06 LAB — COMPREHENSIVE METABOLIC PANEL
ALT: 14 U/L (ref 0–35)
AST: 20 U/L (ref 0–37)
Albumin: 3.3 g/dL — ABNORMAL LOW (ref 3.5–5.2)
CO2: 27 mEq/L (ref 19–32)
Chloride: 102 mEq/L (ref 96–112)
Creatinine, Ser: 0.65 mg/dL (ref 0.50–1.10)
Potassium: 3.6 mEq/L (ref 3.5–5.1)
Sodium: 139 mEq/L (ref 135–145)
Total Bilirubin: 0.2 mg/dL — ABNORMAL LOW (ref 0.3–1.2)

## 2012-11-06 LAB — CBC WITH DIFFERENTIAL/PLATELET
Basophils Absolute: 0 10*3/uL (ref 0.0–0.1)
Basophils Relative: 0 % (ref 0–1)
Lymphocytes Relative: 26 % (ref 12–46)
MCHC: 32.7 g/dL (ref 30.0–36.0)
Monocytes Absolute: 0.6 10*3/uL (ref 0.1–1.0)
Neutro Abs: 6.4 10*3/uL (ref 1.7–7.7)
Neutrophils Relative %: 66 % (ref 43–77)
Platelets: 376 10*3/uL (ref 150–400)
RDW: 13.4 % (ref 11.5–15.5)
WBC: 9.7 10*3/uL (ref 4.0–10.5)

## 2012-11-06 LAB — TYPE AND SCREEN
ABO/RH(D): A POS
Antibody Screen: NEGATIVE

## 2012-11-06 LAB — SURGICAL PCR SCREEN: Staphylococcus aureus: POSITIVE — AB

## 2012-11-06 LAB — HCG, SERUM, QUALITATIVE: Preg, Serum: NEGATIVE

## 2012-11-06 NOTE — Progress Notes (Signed)
Nicole Mata 06/12/1961 MRN [id number]  History of Present Illness:  This is a 51 year old white female who is here to discuss an  upcoming surgery for repair of umbilical hernia and intussusception demonstrated on a CT scan of the abdomen during her recent hospitalization for acute abdominal pain in July 2013. She has a history of gastroesophageal reflux, bile gastritis and H. pylori on upper endoscopy in June 2010. Her last colonoscopy in October 2008 was normal. She is currently on Dexilant 60 mg daily with good control of the reflux. She is complaining of abdominal pressure and pain at the site of the ventral hernia. There have been no recurrent attacks of severe abdominal pain. The hernia seems to subside when she lays down but there is excess pressure on her abdomen when she stands up. Her surgery has been scheduled for 11/19/2012 by Dr.Cornett.    Past Medical History  Diagnosis Date  . Hiatal hernia   . IBS (irritable bowel syndrome)   . GERD (gastroesophageal reflux disease)   . Helicobacter pylori gastritis   . Gastropathy     reactive  . Diverticulosis   . Ileitis   . PONV (postoperative nausea and vomiting)    Past Surgical History  Procedure Date  . Shoulder surgery 2002, 2003    bilateral for bone spurs and torn RC  . Cholecystectomy 2001    reports that she has never smoked. She has never used smokeless tobacco. She reports that she does not drink alcohol or use illicit drugs. family history includes Diabetes in her brother, maternal grandmother, and maternal uncle; Diabetes type II in her mother; Heart attack (age of onset:50) in her father; Heart disease in an unspecified family member; Hyperlipidemia in her father and mother; Hypertension in her father and mother; and Ovarian cancer in her maternal aunts.  There is no history of Colon cancer. Allergies  Allergen Reactions  . Penicillins Anaphylaxis  . Sulfa Drugs Cross Reactors Anaphylaxis  . Contrast Media  (Iodinated Diagnostic Agents) Hives  . Morphine And Related     Pt chooses not to take it  . Azithromycin Other (See Comments)    Makes her feel really crazy.  Marland Kitchen Hydrocodone Nausea And Vomiting        Review of Systems:Negative for dysphagia odynophagia  The remainder of the 10 point ROS is negative except as outlined in H&P   Physical Exam: General appearance  Well developed, in no distress. Eyes- non icteric. HEENT nontraumatic, normocephalic. Mouth no lesions, tongue papillated, no cheilosis. Neck supple without adenopathy, thyroid not enlarged, no carotid bruits, no JVD. Lungs Clear to auscultation bilaterally. Cor normal S1, normal S2, regular rhythm, no murmur,  quiet precordium. Abdomen: In standing position, palpable ventral hernia above the umbilicus 3 cm in diameter is easily reducible when patient lays down. Bowel sounds are normal active.  Rectal:Not done.  Extremities no pedal edema. Skin no lesions. Neurological alert and oriented x 3. Psychological normal mood and affect.  Assessment and Plan:  Problem #1 Enlarging umbilical hernia scheduled to be repaired by Dr. Luisa Hart on December 17. She also had a finding of intussusception and will undergo exploration to repair the defect. We will continue her proton pump inhibitor and antireflux measures. I will see her on an as necessary basis.   11/06/2012 [name]

## 2012-11-06 NOTE — Patient Instructions (Addendum)
Dr Luisa Hart

## 2012-11-06 NOTE — Pre-Procedure Instructions (Signed)
[address] CHANY [address]WORTH  11/06/2012   Your procedure is scheduled on:  Tuesday, December 17th  Report to [facility] at 0530 AM.  Call this number if you have problems the morning of surgery: [phone]   Remember:   Do not eat food or drink:After Midnight.   Take these medicines the morning of surgery with A SIP OF WATER: albuterol if needed, dexilant, zantac eye drops    Do not wear jewelry, make-up or nail polish.  Do not wear lotions, powders, or perfumes.   Do not shave 48 hours prior to surgery. Men may shave face and neck.  Do not bring valuables to the hospital.  Contacts, dentures or bridgework may not be worn into surgery.  Leave suitcase in the car. After surgery it may be brought to your room.  For patients admitted to the hospital, checkout time is 11:00 AM the day of discharge.   Patients discharged the day of surgery will not be allowed to drive home.    Special Instructions: Shower using CHG 2 nights before surgery and the night before surgery.  If you shower the day of surgery use CHG.  Use special wash - you have one bottle of CHG for all showers.  You should use approximately 1/3 of the bottle for each shower.   Please read over the following fact sheets that you were given: Pain Booklet, Coughing and Deep Breathing, MRSA Information and Surgical Site Infection Prevention

## 2012-11-06 NOTE — Progress Notes (Signed)
Primary Physician - Dr. Linford Arnold  Cardiologist - no cardiologist Stress test/echo 2010 in epic

## 2012-11-18 MED ORDER — METRONIDAZOLE IN NACL 5-0.79 MG/ML-% IV SOLN
500.0000 mg | INTRAVENOUS | Status: AC
  Administered 2012-11-19: 500 mg via INTRAVENOUS
  Filled 2012-11-18: qty 100

## 2012-11-18 MED ORDER — CIPROFLOXACIN IN D5W 400 MG/200ML IV SOLN
400.0000 mg | INTRAVENOUS | Status: AC
  Administered 2012-11-19: 400 mg via INTRAVENOUS
  Filled 2012-11-18: qty 200

## 2012-11-18 MED ORDER — CIPROFLOXACIN IN D5W 400 MG/200ML IV SOLN
400.0000 mg | INTRAVENOUS | Status: DC
  Filled 2012-11-18: qty 200

## 2012-11-18 MED ORDER — METRONIDAZOLE IN NACL 5-0.79 MG/ML-% IV SOLN
500.0000 mg | INTRAVENOUS | Status: DC
  Filled 2012-11-18: qty 100

## 2012-11-19 ENCOUNTER — Observation Stay (HOSPITAL_COMMUNITY)
Admission: RE | Admit: 2012-11-19 | Discharge: 2012-11-20 | Disposition: A | Discharge status: Home / Self Care | Payer: BC Managed Care – PPO | Source: Ambulatory Visit | Attending: Surgery | Admitting: Surgery

## 2012-11-19 ENCOUNTER — Encounter (HOSPITAL_COMMUNITY): Payer: Self-pay | Admitting: Anesthesiology

## 2012-11-19 ENCOUNTER — Ambulatory Visit (HOSPITAL_COMMUNITY): Payer: BC Managed Care – PPO | Admitting: Anesthesiology

## 2012-11-19 ENCOUNTER — Encounter (HOSPITAL_COMMUNITY): Payer: Self-pay | Admitting: *Deleted

## 2012-11-19 ENCOUNTER — Ambulatory Visit: Payer: BC Managed Care – PPO | Admitting: Internal Medicine

## 2012-11-19 ENCOUNTER — Encounter (HOSPITAL_COMMUNITY)
Admission: RE | Disposition: A | Discharge status: Home / Self Care | Payer: Self-pay | Source: Ambulatory Visit | Attending: Surgery

## 2012-11-19 DIAGNOSIS — N951 Menopausal and female climacteric states: Secondary | ICD-10-CM

## 2012-11-19 DIAGNOSIS — K439 Ventral hernia without obstruction or gangrene: Secondary | ICD-10-CM

## 2012-11-19 DIAGNOSIS — Z01818 Encounter for other preprocedural examination: Secondary | ICD-10-CM | POA: Insufficient documentation

## 2012-11-19 DIAGNOSIS — K436 Other and unspecified ventral hernia with obstruction, without gangrene: Principal | ICD-10-CM | POA: Insufficient documentation

## 2012-11-19 DIAGNOSIS — Z01812 Encounter for preprocedural laboratory examination: Secondary | ICD-10-CM | POA: Insufficient documentation

## 2012-11-19 HISTORY — PX: LAPAROSCOPY: SHX197

## 2012-11-19 HISTORY — PX: UMBILICAL HERNIA REPAIR: SHX196

## 2012-11-19 LAB — CBC
HCT: 33.3 % — ABNORMAL LOW (ref 36.0–46.0)
MCH: 29.4 pg (ref 26.0–34.0)
MCHC: 33.3 g/dL (ref 30.0–36.0)
RDW: 13.2 % (ref 11.5–15.5)

## 2012-11-19 LAB — CREATININE, SERUM
Creatinine, Ser: 0.56 mg/dL (ref 0.50–1.10)
GFR calc Af Amer: >90 mL/min
GFR calc non Af Amer: >90 mL/min

## 2012-11-19 SURGERY — LAPAROSCOPY, DIAGNOSTIC
Anesthesia: General | Wound class: Clean

## 2012-11-19 MED ORDER — FENTANYL CITRATE 0.05 MG/ML IJ SOLN
INTRAMUSCULAR | Status: DC | PRN
  Administered 2012-11-19: 50 ug via INTRAVENOUS
  Administered 2012-11-19 (×2): 100 ug via INTRAVENOUS

## 2012-11-19 MED ORDER — ENOXAPARIN SODIUM 40 MG/0.4ML ~~LOC~~ SOLN
40.0000 mg | SUBCUTANEOUS | Status: DC
  Administered 2012-11-20: 40 mg via SUBCUTANEOUS
  Filled 2012-11-19 (×2): qty 0.4

## 2012-11-19 MED ORDER — ACETAMINOPHEN 10 MG/ML IV SOLN
INTRAVENOUS | Status: AC
  Filled 2012-11-19: qty 100

## 2012-11-19 MED ORDER — FENTANYL CITRATE 0.05 MG/ML IJ SOLN
100.0000 ug | INTRAMUSCULAR | Status: DC | PRN
  Administered 2012-11-19 – 2012-11-20 (×6): 100 ug via INTRAVENOUS
  Filled 2012-11-19 (×5): qty 2

## 2012-11-19 MED ORDER — HYDROMORPHONE HCL PF 1 MG/ML IJ SOLN: 0.2500 mg | INTRAMUSCULAR | Status: DC | PRN

## 2012-11-19 MED ORDER — ONDANSETRON HCL 4 MG PO TABS
4.0000 mg | ORAL_TABLET | Freq: Four times a day (QID) | ORAL | Status: DC | PRN
  Administered 2012-11-20: 4 mg via ORAL
  Filled 2012-11-19: qty 1

## 2012-11-19 MED ORDER — MELOXICAM 7.5 MG PO TABS
7.5000 mg | ORAL_TABLET | ORAL | Status: DC | PRN
  Filled 2012-11-19: qty 1

## 2012-11-19 MED ORDER — PROMETHAZINE HCL 25 MG/ML IJ SOLN
INTRAMUSCULAR | Status: AC
  Filled 2012-11-19: qty 1

## 2012-11-19 MED ORDER — KETOROLAC TROMETHAMINE 30 MG/ML IJ SOLN: 30.0000 mg | Freq: Four times a day (QID) | INTRAMUSCULAR | Status: DC | PRN

## 2012-11-19 MED ORDER — BUPIVACAINE-EPINEPHRINE 0.25% -1:200000 IJ SOLN
INTRAMUSCULAR | Status: DC | PRN
  Administered 2012-11-19: 30 mL

## 2012-11-19 MED ORDER — 0.9 % SODIUM CHLORIDE (POUR BTL) OPTIME
TOPICAL | Status: DC | PRN
  Administered 2012-11-19: 1000 mL

## 2012-11-19 MED ORDER — NEOSTIGMINE METHYLSULFATE 1 MG/ML IJ SOLN
INTRAMUSCULAR | Status: DC | PRN
  Administered 2012-11-19: 3 mg via INTRAVENOUS

## 2012-11-19 MED ORDER — LACTATED RINGERS IV SOLN
INTRAVENOUS | Status: DC | PRN
  Administered 2012-11-19 (×2): via INTRAVENOUS

## 2012-11-19 MED ORDER — PANTOPRAZOLE SODIUM 40 MG PO TBEC
40.0000 mg | DELAYED_RELEASE_TABLET | Freq: Every day | ORAL | Status: DC
  Administered 2012-11-19: 40 mg via ORAL
  Filled 2012-11-19 (×2): qty 1

## 2012-11-19 MED ORDER — BUPIVACAINE-EPINEPHRINE PF 0.25-1:200000 % IJ SOLN
INTRAMUSCULAR | Status: AC
  Filled 2012-11-19: qty 30

## 2012-11-19 MED ORDER — ALVIMOPAN 12 MG PO CAPS
12.0000 mg | ORAL_CAPSULE | ORAL | Status: AC
  Administered 2012-11-19: 12 mg via ORAL
  Filled 2012-11-19: qty 1

## 2012-11-19 MED ORDER — ACETAMINOPHEN 10 MG/ML IV SOLN
1000.0000 mg | Freq: Once | INTRAVENOUS | Status: AC
  Administered 2012-11-19: 1000 mg via INTRAVENOUS
  Filled 2012-11-19: qty 100

## 2012-11-19 MED ORDER — ARTIFICIAL TEARS OP OINT
TOPICAL_OINTMENT | OPHTHALMIC | Status: DC | PRN
  Administered 2012-11-19: 1 via OPHTHALMIC

## 2012-11-19 MED ORDER — GLYCOPYRROLATE 0.2 MG/ML IJ SOLN
INTRAMUSCULAR | Status: DC | PRN
  Administered 2012-11-19: 0.4 mg via INTRAVENOUS

## 2012-11-19 MED ORDER — LIDOCAINE HCL 4 % MT SOLN
OROMUCOSAL | Status: DC | PRN
  Administered 2012-11-19: 4 mL via TOPICAL

## 2012-11-19 MED ORDER — KCL IN DEXTROSE-NACL 20-5-0.9 MEQ/L-%-% IV SOLN
INTRAVENOUS | Status: DC
  Administered 2012-11-19 – 2012-11-20 (×2): via INTRAVENOUS
  Filled 2012-11-19 (×3): qty 1000

## 2012-11-19 MED ORDER — FENTANYL CITRATE 0.05 MG/ML IJ SOLN
INTRAMUSCULAR | Status: AC
  Administered 2012-11-19: 100 ug via INTRAVENOUS
  Filled 2012-11-19: qty 2

## 2012-11-19 MED ORDER — PROPOFOL 10 MG/ML IV BOLUS
INTRAVENOUS | Status: DC | PRN
  Administered 2012-11-19: 200 mg via INTRAVENOUS

## 2012-11-19 MED ORDER — ONDANSETRON HCL 4 MG/2ML IJ SOLN
INTRAMUSCULAR | Status: DC | PRN
  Administered 2012-11-19: 4 mg via INTRAVENOUS

## 2012-11-19 MED ORDER — LIDOCAINE HCL (CARDIAC) 20 MG/ML IV SOLN
INTRAVENOUS | Status: DC | PRN
  Administered 2012-11-19: 70 mg via INTRAVENOUS

## 2012-11-19 MED ORDER — OXYCODONE HCL 5 MG PO TABS: 5.0000 mg | ORAL_TABLET | Freq: Once | ORAL | Status: DC | PRN

## 2012-11-19 MED ORDER — OXYCODONE-ACETAMINOPHEN 5-325 MG PO TABS
1.0000 | ORAL_TABLET | ORAL | Status: DC | PRN
  Administered 2012-11-20: 2 via ORAL
  Filled 2012-11-19: qty 2

## 2012-11-19 MED ORDER — ROCURONIUM BROMIDE 100 MG/10ML IV SOLN
INTRAVENOUS | Status: DC | PRN
  Administered 2012-11-19: 50 mg via INTRAVENOUS

## 2012-11-19 MED ORDER — CHLORHEXIDINE GLUCONATE 4 % EX LIQD
1.0000 | Freq: Once | CUTANEOUS | Status: DC
Start: 2012-11-19 — End: 2012-11-19

## 2012-11-19 MED ORDER — DEXAMETHASONE SODIUM PHOSPHATE 4 MG/ML IJ SOLN
INTRAMUSCULAR | Status: DC | PRN
  Administered 2012-11-19: 4 mg via INTRAVENOUS

## 2012-11-19 MED ORDER — OXYCODONE HCL 5 MG/5ML PO SOLN: 5.0000 mg | Freq: Once | ORAL | Status: DC | PRN

## 2012-11-19 MED ORDER — DEXTROSE 5 % IV SOLN
INTRAVENOUS | Status: DC | PRN
  Administered 2012-11-19: 08:00:00 via INTRAVENOUS

## 2012-11-19 MED ORDER — ALBUTEROL SULFATE HFA 108 (90 BASE) MCG/ACT IN AERS
2.0000 | INHALATION_SPRAY | RESPIRATORY_TRACT | Status: DC | PRN
  Filled 2012-11-19: qty 6.7

## 2012-11-19 MED ORDER — MEPERIDINE HCL 25 MG/ML IJ SOLN: 6.2500 mg | INTRAMUSCULAR | Status: DC | PRN

## 2012-11-19 MED ORDER — PROMETHAZINE HCL 25 MG/ML IJ SOLN
6.2500 mg | INTRAMUSCULAR | Status: AC | PRN
  Administered 2012-11-19 (×2): 12.5 mg via INTRAVENOUS

## 2012-11-19 MED ORDER — ONDANSETRON HCL 4 MG/2ML IJ SOLN
4.0000 mg | Freq: Four times a day (QID) | INTRAMUSCULAR | Status: DC | PRN
  Administered 2012-11-19: 4 mg via INTRAVENOUS
  Filled 2012-11-19: qty 2

## 2012-11-19 MED ORDER — MIDAZOLAM HCL 5 MG/5ML IJ SOLN
INTRAMUSCULAR | Status: DC | PRN
  Administered 2012-11-19: 2 mg via INTRAVENOUS

## 2012-11-19 SURGICAL SUPPLY — 74 items
ADH SKN CLS APL DERMABOND .7 (GAUZE/BANDAGES/DRESSINGS) ×2
APPLIER CLIP ROT 10 11.4 M/L (STAPLE)
APR CLP MED LRG 11.4X10 (STAPLE)
BINDER ABD UNIV 12 45-62 (WOUND CARE) ×1 IMPLANT
BINDER ABDOMINAL 46IN 62IN (WOUND CARE) ×2
BLADE SURG 10 STRL SS (BLADE) ×2 IMPLANT
BLADE SURG ROTATE 9660 (MISCELLANEOUS) IMPLANT
CANISTER SUCTION 2500CC (MISCELLANEOUS) ×2 IMPLANT
CELLS DAT CNTRL 66122 CELL SVR (MISCELLANEOUS) IMPLANT
CHLORAPREP W/TINT 26ML (MISCELLANEOUS) ×2 IMPLANT
CLIP APPLIE ROT 10 11.4 M/L (STAPLE) IMPLANT
CLOTH BEACON ORANGE TIMEOUT ST (SAFETY) ×2 IMPLANT
COVER SURGICAL LIGHT HANDLE (MISCELLANEOUS) ×2 IMPLANT
DECANTER SPIKE VIAL GLASS SM (MISCELLANEOUS) ×2 IMPLANT
DERMABOND ADVANCED (GAUZE/BANDAGES/DRESSINGS) ×2
DERMABOND ADVANCED .7 DNX12 (GAUZE/BANDAGES/DRESSINGS) ×1 IMPLANT
DEVICE SECURE STRAP 25 ABSORB (INSTRUMENTS) ×3 IMPLANT
DEVICE TROCAR PUNCTURE CLOSURE (ENDOMECHANICALS) ×2 IMPLANT
DRAPE UTILITY 15X26 W/TAPE STR (DRAPE) ×4 IMPLANT
DRAPE WARM FLUID 44X44 (DRAPE) ×2 IMPLANT
ELECT CAUTERY BLADE 6.4 (BLADE) ×3 IMPLANT
ELECT REM PT RETURN 9FT ADLT (ELECTROSURGICAL) ×2
ELECTRODE REM PT RTRN 9FT ADLT (ELECTROSURGICAL) ×1 IMPLANT
GLOVE BIO SURGEON STRL SZ7.5 (GLOVE) ×1 IMPLANT
GLOVE BIO SURGEON STRL SZ8 (GLOVE) ×4 IMPLANT
GLOVE BIOGEL PI IND STRL 7.0 (GLOVE) IMPLANT
GLOVE BIOGEL PI IND STRL 7.5 (GLOVE) IMPLANT
GLOVE BIOGEL PI IND STRL 8 (GLOVE) ×1 IMPLANT
GLOVE BIOGEL PI INDICATOR 7.0 (GLOVE) ×2
GLOVE BIOGEL PI INDICATOR 7.5 (GLOVE) ×1
GLOVE BIOGEL PI INDICATOR 8 (GLOVE) ×1
GLOVE EUDERMIC 7 POWDERFREE (GLOVE) ×1 IMPLANT
GLOVE SURG SS PI 7.0 STRL IVOR (GLOVE) ×1 IMPLANT
GOWN PREVENTION PLUS XLARGE (GOWN DISPOSABLE) ×1 IMPLANT
GOWN STRL NON-REIN LRG LVL3 (GOWN DISPOSABLE) ×7 IMPLANT
KIT BASIN OR (CUSTOM PROCEDURE TRAY) ×2 IMPLANT
KIT ROOM TURNOVER OR (KITS) ×4 IMPLANT
LIGASURE IMPACT 36 18CM CVD LR (INSTRUMENTS) IMPLANT
MARKER SKIN DUAL TIP RULER LAB (MISCELLANEOUS) ×2 IMPLANT
MESH PARIETEX 6X4 (Mesh General) ×1 IMPLANT
NDL SPNL 22GX3.5 QUINCKE BK (NEEDLE) ×1 IMPLANT
NEEDLE SPNL 22GX3.5 QUINCKE BK (NEEDLE) ×2 IMPLANT
NS IRRIG 1000ML POUR BTL (IV SOLUTION) ×4 IMPLANT
PAD ARMBOARD 7.5X6 YLW CONV (MISCELLANEOUS) ×4 IMPLANT
PENCIL BUTTON HOLSTER BLD 10FT (ELECTRODE) ×2 IMPLANT
RETRACTOR WND ALEXIS 18 MED (MISCELLANEOUS) IMPLANT
RTRCTR WOUND ALEXIS 18CM MED (MISCELLANEOUS)
SCALPEL HARMONIC ACE (MISCELLANEOUS) IMPLANT
SCISSORS LAP 5X35 DISP (ENDOMECHANICALS) IMPLANT
SET IRRIG TUBING LAPAROSCOPIC (IRRIGATION / IRRIGATOR) ×1 IMPLANT
SLEEVE ENDOPATH XCEL 5M (ENDOMECHANICALS) ×4 IMPLANT
SPECIMEN JAR LARGE (MISCELLANEOUS) IMPLANT
SPONGE GAUZE 4X4 12PLY (GAUZE/BANDAGES/DRESSINGS) ×1 IMPLANT
STAPLER VISISTAT 35W (STAPLE) ×1 IMPLANT
SUT MNCRL AB 4-0 PS2 18 (SUTURE) ×4 IMPLANT
SUT NOVA NAB DX-16 0-1 5-0 T12 (SUTURE) ×3 IMPLANT
SUT VIC AB 2-0 BRD 54 (SUTURE) ×1 IMPLANT
SUT VIC AB 2-0 SH 18 (SUTURE) ×1 IMPLANT
SUT VIC AB 3-0 54X BRD REEL (SUTURE) ×1 IMPLANT
SUT VIC AB 3-0 BRD 54 (SUTURE)
SUT VIC AB 3-0 SH 18 (SUTURE) ×1 IMPLANT
SYS LAPSCP GELPORT 120MM (MISCELLANEOUS)
SYSTEM LAPSCP GELPORT 120MM (MISCELLANEOUS) IMPLANT
TOWEL OR 17X24 6PK STRL BLUE (TOWEL DISPOSABLE) ×4 IMPLANT
TOWEL OR 17X26 10 PK STRL BLUE (TOWEL DISPOSABLE) ×2 IMPLANT
TRAY FOLEY CATH 14FR (SET/KITS/TRAYS/PACK) ×1 IMPLANT
TRAY FOLEY CATH 14FRSI W/METER (CATHETERS) ×2 IMPLANT
TRAY LAPAROSCOPIC (CUSTOM PROCEDURE TRAY) ×2 IMPLANT
TROCAR XCEL NON-BLD 11X100MML (ENDOMECHANICALS) IMPLANT
TROCAR XCEL NON-BLD 5MMX100MML (ENDOMECHANICALS) ×2 IMPLANT
TUBE CONNECTING 12X1/4 (SUCTIONS) ×2 IMPLANT
TUBING FILTER THERMOFLATOR (ELECTROSURGICAL) ×1 IMPLANT
WATER STERILE IRR 1000ML POUR (IV SOLUTION) IMPLANT
YANKAUER SUCT BULB TIP NO VENT (SUCTIONS) ×3 IMPLANT

## 2012-11-19 NOTE — Anesthesia Procedure Notes (Signed)
Procedure Name: Intubation Date/Time: 11/19/2012 8:12 AM Performed by: Romie Minus K Pre-anesthesia Checklist: Patient identified, Emergency Drugs available, Suction available, Patient being monitored and Timeout performed Patient Re-evaluated:Patient Re-evaluated prior to inductionOxygen Delivery Method: Circle system utilized Preoxygenation: Pre-oxygenation with 100% oxygen Intubation Type: IV induction Ventilation: Mask ventilation without difficulty and Oral airway inserted - appropriate to patient size Laryngoscope Size: Miller and 2 Grade View: Grade I Tube size: 7.5 mm Number of attempts: 1 Airway Equipment and Method: Stylet and LTA kit utilized Placement Confirmation: ETT inserted through vocal cords under direct vision,  positive ETCO2 and breath sounds checked- equal and bilateral Secured at: 22 cm Tube secured with: Tape Dental Injury: Teeth and Oropharynx as per pre-operative assessment

## 2012-11-19 NOTE — H&P (Signed)
Currently admitted as of 11/19/2012  Demographics Nicole Mata 51 year old female  Comm Pref:   [address]  [place] [address] [phone] (M)  Works as Clinical biochemist at [employer]  Problem ListHospitalization ProblemNon-Hospital  GERD  HIATAL HERNIA  IRRITABLE BOWEL SYNDROME  HELICOBACTER PYLORI GASTRITIS, HX OF  Cervical radiculopathy at C7  Obese  Insulin resistance  Ileitis  Umbilical hernia  Small bowel disease  Significant History/Details  Smoking: Never Smoker   Smokeless Tobacco: Never Used  Alcohol: No  1 open order  Language: English   Specialty CommentsEditShow AllReport11/11/13 pt declined anyone on phi 10/14/12 no bowel prep per TC DOS: 11/19/12-TC/AR- [facility]- OPB/ Diag lap,poss lap umb hernia rep w/mesh,poss lap small bowell resect/gen-pat e 10/14/12 10/15/2012 patient scheduled for 23 hrs observation surgery 11/19/2012 @ [facility] no precert required. (pae,chm)   MedicationsFacility-Administered Medications Show prescriptions  chlorhexidine (HIBICLENS) 4 % liquid 1 application  ciprofloxacin (CIPRO) IVPB 400 mg  ciprofloxacin (CIPRO) IVPB 400 mg  metroNIDAZOLE (FLAGYL) IVPB 500 mg  metroNIDAZOLE (FLAGYL) IVPB 500 mg    Relevant Labs (3 years)  Na K Cl C02 WBC Hgb Hct Plts  11/06/12 1308 -- -- -- -- 9.7 12.8 39.1 376  11/06/12 1308 139 3.6 102 -- -- -- -- --  10/08/12 1208 -- -- -- -- 13.5 13.0 40.0 358.0  06/09/12 0445 -- -- -- -- 7.8 10.7 30.6 235  06/09/12 0445 140 3.3 104 -- -- -- -- --                  Relevant Encounters (Maximum of 10 visits)Date Type Department Provider Description  11/19/2012 Surgery [facility] OPERATING ROOM Dortha Schwalbe., MD   10/14/2012 Office Visit [facility] Harriette Bouillon A., MD Umbilical Hernia (Primary Dx); Small Bowel Disease          My Last Outpatient Progress NoteStatus Last Edited Encounter Date  Patient ID:  Nicole Mata, female   DOB: 10/17/1961, 51 y.o.   MRN: [id number]   No chief complaint on file.   HPI Nicole Mata is a 51 y.o. female.  Patient sent at the request of Dr. Dickie La due to a long-standing history of chronic abdominal pain. She has a history of intermittent diffuse abdominal pain and was hospitalized in July 2013 with what appeared to be thickened proximal jejunum and terminal ileum with umbilical hernia. There is some stranding in the mesentery and she had an elevated white count. This resolved with medical treatment. She continues to have intermittent crampy abdominal pain. It comes and goes. There is some associated nausea with it. CT scan showed thickened jejunum with question of intussusception and a periumbilical hernia. She is asymptomatic currently. Denies any diarrhea constipation. HPI    Past Medical History   Diagnosis  Date   .  Hiatal hernia     .  IBS (irritable bowel syndrome)     .  GERD (gastroesophageal reflux disease)     .  Helicobacter pylori gastritis     .  Gastropathy         reactive   .  Diverticulosis     .  Diabetes mellitus     .  DJD (degenerative joint disease)     .  Ileitis         Past Surgical History   Procedure  Date   .  Shoulder surgery  2002, 2003  bilateral for bone spurs and torn RC   .  Cholecystectomy  2001       Family History   Problem  Relation  Age of Onset   .  Heart disease           family disease   .  Heart attack  Father  50       grandparents   .  Hypertension  Father     .  Hyperlipidemia  Father     .  Diabetes  Maternal Grandmother     .  Diabetes  Maternal Uncle     .  Ovarian cancer  Maternal Aunt         x 2   .  Hypertension  Mother     .  Hyperlipidemia  Mother     .  Diabetes  Brother     .  Colon cancer  Neg Hx     .  Ovarian cancer  Maternal Aunt     .  Diabetes type II  Mother        Social History History   Substance Use Topics   .  Smoking status:  Never Smoker    .   Smokeless tobacco:  Never Used   .  Alcohol Use:  No       Allergies   Allergen  Reactions   .  Penicillins  Anaphylaxis   .  Sulfa Drugs Cross Reactors  Anaphylaxis   .  Contrast Media (Iodinated Diagnostic Agents)  Hives   .  Morphine And Related         Pt chooses not to take it   .  Azithromycin  Other (See Comments)       Makes her feel really crazy.   Marland Kitchen  Hydrocodone  Nausea And Vomiting       Current Outpatient Prescriptions   Medication  Sig  Dispense  Refill   .  albuterol (PROVENTIL HFA;VENTOLIN HFA) 108 (90 BASE) MCG/ACT inhaler  Inhale 2 puffs into the lungs every 6 (six) hours as needed. For shortness of breath or wheezing         .  dexlansoprazole (DEXILANT) 60 MG capsule  Take 1 capsule (60 mg total) by mouth 2 (two) times daily.   60 capsule   2   .  dicyclomine (BENTYL) 10 MG capsule  Take one po BID   20 capsule   0   .  ketotifen (ZADITOR) 0.025 % ophthalmic solution  Place 1 drop into both eyes every morning.         Marland Kitchen  levofloxacin (LEVAQUIN) 500 MG tablet  Take 1 tablet (500 mg total) by mouth daily.   7 tablet   0   .  meloxicam (MOBIC) 7.5 MG tablet  Take 7.5 mg by mouth as needed.         .  norethindrone-ethinyl estradiol (JUNEL 1/20) 1-20 MG-MCG tablet  Take 1 tablet by mouth daily.   1 Package   7   .  ranitidine (ZANTAC) 150 MG tablet  Take 1 tablet (150 mg total) by mouth at bedtime.   30 tablet   10      Review of Systems Review of Systems  Constitutional: Negative for fever, chills and unexpected weight change.  HENT: Negative for hearing loss, congestion, sore throat, trouble swallowing and voice change.   Eyes: Negative for visual disturbance.  Respiratory: Negative for cough and wheezing.   Cardiovascular: Negative for  chest pain, palpitations and leg swelling.  Gastrointestinal: Positive for abdominal pain, diarrhea and constipation. Negative for nausea, vomiting, blood in stool, abdominal distention and anal bleeding.  Genitourinary: Negative  for hematuria, vaginal bleeding and difficulty urinating.  Musculoskeletal: Negative for arthralgias.  Skin: Negative for rash and wound.  Neurological: Negative for seizures, syncope and headaches.  Hematological: Negative for adenopathy. Does not bruise/bleed easily.  Psychiatric/Behavioral: Negative for confusion.    Blood pressure 132/86, pulse 72, temperature 97.8 F (36.6 C), resp. rate 14, height 5\' 6"  (1.676 m), weight 220 lb 6.4 oz (99.973 kg), last menstrual period 09/19/2012.   Physical Exam Physical Exam  Constitutional: She is oriented to person, place, and time. She appears well-developed and well-nourished.  HENT:   Head: Normocephalic and atraumatic.  Eyes: EOM are normal. Pupils are equal, round, and reactive to light.  Neck: Normal range of motion. Neck supple.  Cardiovascular: Normal rate and regular rhythm.   Pulmonary/Chest: Effort normal and breath sounds normal.  Abdominal: Soft. Bowel sounds are normal. She exhibits no distension. There is no tenderness. There is no rebound.  Musculoskeletal: Normal range of motion.  Neurological: She is alert and oriented to person, place, and time.  Skin: Skin is warm and dry.  Psychiatric: She has a normal mood and affect. Her behavior is normal. Judgment and thought content normal.    Data Reviewed *RADIOLOGY REPORT*   Clinical Data: Chronic abdominal pain. Variable bowel habits.   Nausea. Leukocytosis. Focal periumbilical swelling.   CT ABDOMEN AND PELVIS WITHOUT CONTRAST   Technique: Multidetector CT imaging of the abdomen and pelvis was   performed following the standard protocol without intravenous   contrast.   Comparison: 06/06/2012   Findings: The visualized portion of the liver, spleen, pancreas,   and adrenal glands appear unremarkable in noncontrast CT   appearance.   Gallbladder surgically absent. Nonobstructive new left   nephrolithiasis includes a 2 mm left mid kidney nonobstructive   calculus and a 2  mm left kidney lower pole nonobstructive calculus.   Small peripelvic cysts noted no right-sided calculus or ureteral   calculus observed.   A supraumbilical hernia contains omental adipose tissue and   measures 1.8 cm and the hernia neck and 5.2 cm in transverse   diameter. This hernia does not contain bowel. No significant   internal stranding within this supraumbilical hernia.   No pathologic retroperitoneal or porta hepatis adenopathy is   identified.   No pathologic pelvic adenopathy is identified.   Appendix unremarkable. Sigmoid diverticulosis noted without active   diverticulitis.   There is a 3.3 cm segment of proximal jejunojejunal intussusception   shown on image 53 of series 602 and also well shown on images 128-   139 of series 603, without associated small bowel dilatation.   IMPRESSION:   1. Supraumbilical hernia contains adipose tissue but not bowel.   2. 3.3 cm proximal jejunum jejunal intussusception in the left   upper quadrant, without associated small bowel dilatation. An   obvious causative lesion is not observed, and the significance is   uncertain.   3. Nonobstructive left nephrolithiasis.   4. Sigmoid diverticulosis.   Original Report Authenticated By: Gaylyn Rong, M.D.      Assessment History of chronic abdominal pain   Periumbilical hernia   Thickened loop of proximal jejunum clinically asymptomatic currently   Plan I have reviewed his CT scan. I compared this to her CT scan from July of 2013. The small bowel it was  felt to be an intussusception appears thickened and is located in the proximal jejunum. She has no clinical signs of intussusception. She does have a fairly large periumbilical hernia. I feel laparoscopy with possible hernia repair and small bowel resection would be most useful at this point given her long-standing symptoms and CT findings are not conclusive for any particular diagnosis except umbilical hernia. I discussed the  possibility of small bowel resection and anastomosis and the risks associated with that which include bleeding, infection, anastomotic leakage require further surgery, death, DVT, and further surgery. I discussed hernia repair as well and depending on intra-abdominal findings this can be done laparoscopically. She may require an open incision this was discussed. She is very proceed. Other options were discussed which include further workup with colonoscopy and capsule endoscopy but I think the yield for these tests is low. She's ready to proceed.The procedure has been discussed with the patient.  Alternative therapies have been discussed with the patient.  Operative risks include bleeding,  Infection,  Organ injury,  Nerve injury,  Blood vessel injury,  DVT,  Pulmonary embolism,  Death,  And possible reoperation.  Medical management risks include worsening of present situation.  The success of the procedure is 50 -90 % at treating patients symptoms.  The patient understands and agrees to proceed.       Sailor Hevia A.

## 2012-11-19 NOTE — Preoperative (Signed)
Beta Blockers   Reason not to administer Beta Blockers:Not Applicable 

## 2012-11-19 NOTE — Transfer of Care (Signed)
Immediate Anesthesia Transfer of Care Note  Patient: MAKARIA POARCH  Procedure(s) Performed: Procedure(s) (LRB) with comments: LAPAROSCOPY DIAGNOSTIC (N/A) - Diagnostic laparoscopy LAPAROSCOPIC UMBILICAL HERNIA (N/A) - ventral hernia repair with mesh  Patient Location: PACU  Anesthesia Type:General  Level of Consciousness: awake, alert  and oriented  Airway & Oxygen Therapy: Patient Spontanous Breathing and Patient connected to nasal cannula oxygen  Post-op Assessment: Report given to PACU RN and Post -op Vital signs reviewed and stable  Post vital signs: Reviewed  Complications: No apparent anesthesia complications

## 2012-11-19 NOTE — Anesthesia Postprocedure Evaluation (Signed)
  Anesthesia Post-op Note  Patient: Nicole Mata  Procedure(s) Performed: Procedure(s) (LRB) with comments: LAPAROSCOPY DIAGNOSTIC (N/A) - Diagnostic laparoscopy LAPAROSCOPIC UMBILICAL HERNIA (N/A) - ventral hernia repair with mesh  Patient Location: PACU  Anesthesia Type:General  Level of Consciousness: awake  Airway and Oxygen Therapy: Patient Spontanous Breathing  Post-op Pain: mild  Post-op Assessment: Post-op Vital signs reviewed  Post-op Vital Signs: stable  Complications: No apparent anesthesia complications

## 2012-11-19 NOTE — Interval H&P Note (Signed)
History and Physical Interval Note:  11/19/2012 7:19 AM  Nicole Mata  has presented today for surgery, with the diagnosis of abdominal pain  The various methods of treatment have been discussed with the patient and family. After consideration of risks, benefits and other options for treatment, the patient has consented to  Procedure(s) (LRB) with comments: LAPAROSCOPY DIAGNOSTIC (N/A) - Diagnostic laparoscopy LAPAROSCOPIC UMBILICAL HERNIA (N/A) - possible laparoscopic umbilical hernia repair with mesh LAPAROSCOPIC SMALL BOWEL RESECTION (N/A) - Possible laparoscopic small bowel resection as a surgical intervention .  The patient's history has been reviewed, patient examined, no change in status, stable for surgery.  I have reviewed the patient's chart and labs.  Questions were answered to the patient's satisfaction.     [name]

## 2012-11-19 NOTE — Op Note (Signed)
Nicole Mata, Nicole Mata NO.:  [id number]  MEDICAL RECORD NO.:  [id number]  LOCATION:  6N06C                        FACILITY:  [facility]  PHYSICIAN:  Maisie Fus A. Rebecca Motta, M.D.DATE OF BIRTH:  March 24, 1961  DATE OF PROCEDURE:  11/19/2012 DATE OF DISCHARGE:                              OPERATIVE REPORT   PREOPERATIVE DIAGNOSES: 1. Incarcerated periumbilical ventral hernia 2. Possible thickened loop of proximal small bowel.  POSTOPERATIVE DIAGNOSIS:  Incarcerated 3 cm x 3 cm periumbilical ventral hernia.  PROCEDURE: 1. Diagnostic laparoscopy. 2. Laparoscopic ventral hernia repair using dual Parietex mesh     measuring 10 x 15 cm.  SURGEON:  Maisie Fus A. Aaryn Sermon, MD  ASSISTANT:  Currie Paris, MD  ANESTHESIA:  General endotracheal anesthesia with 0.25% Sensorcaine local with epinephrine.  EBL:  Minimal.  SPECIMENS:  None.  DRAINS:  None.  IV FLUIDS:  Approximately 600 mL of crystalloid.  INDICATIONS FOR PROCEDURE:  The patient is a 51 year old female with chronic abdominal pain.  CT scan showed incarcerated periumbilical hernia and a thickened loop of proximal jejunum.  Recommend a laparoscopy to further evaluate her small intestine since her workup was otherwise negative and repair of her ventral hernia.  Risk, benefits, and alternative therapies discussed and outlined in the history and physical.  She understood the possibility of having to open as well as possibility of bowel injury during the procedure.  After discussion of the above, she agreed to proceed.  DESCRIPTION OF PROCEDURE:  The patient was met in the holding area. Questions were answered.  She was taken back to the operating room and placed supine on the operating room table.  After induction of general anesthesia, Foley catheter was placed and the abdomen was prepped and draped in sterile fashion.  Time-out was done and she received appropriate preoperative antibiotics.  A right lower  quadrant Optiview 5 mm port was placed under direct vision without difficulty into the abdominal cavity and pneumoperitoneum was achieved.  Three other 5 mm ports were placed, 1 in the patient's right upper quadrant and the other 2 were placed in the left upper and left lower quadrants respectively. Laparoscopy showed incarcerated omentum and a 3 cm periumbilical defect that was reduced without difficulty.  The small intestine was run from the ligament of Treitz to the ileocecal valve.  This was done carefully. No evidence of any small bowel tumor or thickening or other abnormality to explain her CT scan from July of 2013 was noted.  Ascending, transverse, descending colons were normal as well as sigmoid and rectum. Liver was normal.  Gallbladder was surgically absent.  Stomach was normal.  We then turned our attention to the hernia.  Spinal needle was used to measure out the maximal diameter of the hernia which was 3 cm x 3 cm just above the umbilicus.  A small incision was made of the hernia sac and a 10 cm x 15 cm piece of dual Parietex mesh was inserted.  Prior to this, it was soaked in saline and then oriented in 4 sutures were placed in the mesh.  Once the mesh was inserted, we closed the fascia with 0 Vicryl.  Pneumoperitoneum was reinstated.  The mesh was then oriented with the smooth side towards the bowel.  Suture passer was used and all 4 sutures were pulled up respective to abdominal wall markings placed prior to placing the mesh.  These were then tied down with minimal redundancy of the mesh and adequate coverage of at least 3 cm of the hernia defect circumferentially.  A secure strap was used to tack the mesh circumferentially at the peritoneal lining.  There were no gaps.  The small bowel was then reexamined.  There is no evidence of injury to the small bowel, colon, or other intra-abdominal viscera.  The omentum was pulled back down to cover the small bowel below the  mesh. At this point, 2 additional sutures were placed in the fascia of the periumbilical hernia defect.  We then removed our ports allowing the CO2 to escape.  Skin was closed with 4-0 Monocryl.  Dermabond applied to all port sites.  Foley catheter removed.  All final counts sponge, needle, and instruments found to be correct.  The patient was then placed in abdominal binder, extubated, taken to recovery in satisfactory condition.     Saliah Crisp A. Conn Trombetta, M.D.     TAC/MEDQ  D:  11/19/2012  T:  11/19/2012  Job:  [id number]

## 2012-11-19 NOTE — Anesthesia Preprocedure Evaluation (Addendum)
Anesthesia Evaluation  Patient identified by MRN, date of birth, ID band Patient awake    Reviewed: Allergy & Precautions, H&P , NPO status , Patient's Chart, lab work & pertinent test results  History of Anesthesia Complications (+) PONV  Airway Mallampati: I TM Distance: >3 FB Neck ROM: Full    Dental  (+) Teeth Intact and Dental Advisory Given   Pulmonary neg pulmonary ROS,  breath sounds clear to auscultation        Cardiovascular negative cardio ROS  Rhythm:Regular Rate:Normal     Neuro/Psych  Neuromuscular disease    GI/Hepatic Neg liver ROS, hiatal hernia, GERD-  Medicated and Controlled,  Endo/Other  negative endocrine ROS  Renal/GU negative Renal ROS     Musculoskeletal   Abdominal   Peds  Hematology negative hematology ROS (+)   Anesthesia Other Findings   Reproductive/Obstetrics                          Anesthesia Physical Anesthesia Plan  ASA: II  Anesthesia Plan: General   Post-op Pain Management:    Induction: Intravenous  Airway Management Planned: Oral ETT  Additional Equipment:   Intra-op Plan:   Post-operative Plan: Extubation in OR  Informed Consent: I have reviewed the patients History and Physical, chart, labs and discussed the procedure including the risks, benefits and alternatives for the proposed anesthesia with the patient or authorized representative who has indicated his/her understanding and acceptance.   Dental advisory given  Plan Discussed with: CRNA and Surgeon  Anesthesia Plan Comments:         Anesthesia Quick Evaluation

## 2012-11-19 NOTE — Brief Op Note (Signed)
11/19/2012  9:19 AM  PATIENT:  Erasmo Leventhal  51 y.o. female  PRE-OPERATIVE DIAGNOSIS:  ventral hernia  POST-OPERATIVE DIAGNOSIS:  ventral hernia  PROCEDURE:  Procedure(s) (LRB) with comments: LAPAROSCOPY DIAGNOSTIC (N/A) - Diagnostic laparoscopy LAPAROSCOPIC UMBILICAL HERNIA (N/A) - ventral hernia repair with mesh  SURGEON:  Surgeon(s) and Role:    * Rosselyn Martha A. Josaphine Shimamoto, MD - Primary    * Currie Paris, MD - Assisting  PHYSICIAN ASSISTANT:   ASSISTANTS: none   ANESTHESIA:   general  EBL:  Total I/O In: 1200 [I.V.:1200] Out: 75 [Urine:75]  BLOOD ADMINISTERED:none  DRAINS: none   LOCAL MEDICATIONS USED:  MARCAINE     SPECIMEN:  No Specimen  DISPOSITION OF SPECIMEN:  N/A  COUNTS:  YES  TOURNIQUET:  * No tourniquets in log *  DICTATION: .Other Dictation: Dictation Number [id number]  PLAN OF CARE: Admit to inpatient   PATIENT DISPOSITION:  PACU - hemodynamically stable.   Delay start of Pharmacological VTE agent (>24hrs) due to surgical blood loss or risk of bleeding: no

## 2012-11-20 MED ORDER — POLYETHYLENE GLYCOL 3350 17 GM/SCOOP PO POWD: 17.0000 g | Freq: Every day | ORAL | Status: DC

## 2012-11-20 MED ORDER — OXYCODONE-ACETAMINOPHEN 5-325 MG PO TABS: 1.0000 | ORAL_TABLET | ORAL | Status: DC | PRN

## 2012-11-20 NOTE — Progress Notes (Signed)
1 Day Post-Op  Subjective: sore  Objective: Vital signs in last 24 hours: Temp:  [96.8 F (36 C)-98.2 F (36.8 C)] 98 F (36.7 C) (12/18 0606) Pulse Rate:  [74-98] 87  (12/18 0606) Resp:  [10-20] 18  (12/18 0606) BP: (118-158)/(66-85) 118/68 mmHg (12/18 0606) SpO2:  [95 %-99 %] 95 % (12/18 0606) Weight:  [220 lb 14.4 oz (100.2 kg)] 220 lb 14.4 oz (100.2 kg) (12/17 1150) Last BM Date: 11/19/12  Intake/Output from previous day: 12/17 0701 - 12/18 0700 In: 3539 [P.O.:720; I.V.:2819] Out: 1900 [Urine:1900] Intake/Output this shift:    Incision/Wound:intact sore   Lab Results:   Basename 11/19/12 1235  WBC 11.0*  HGB 11.1*  HCT 33.3*  PLT 280   BMET  Basename 11/19/12 1235  NA --  K --  CL --  CO2 --  GLUCOSE --  BUN --  CREATININE 0.56  CALCIUM --   PT/INR No results found for this basename: LABPROT:2,INR:2 in the last 72 hours ABG No results found for this basename: PHART:2,PCO2:2,PO2:2,HCO3:2 in the last 72 hours  Studies/Results: No results found.  Anti-infectives: Anti-infectives     Start     Dose/Rate Route Frequency Ordered Stop   11/19/12 0600   metroNIDAZOLE (FLAGYL) IVPB 500 mg  Status:  Discontinued        500 mg 100 mL/hr over 60 Minutes Intravenous On call to O.R. 11/18/12 1412 11/19/12 1144   11/19/12 0600   ciprofloxacin (CIPRO) IVPB 400 mg        400 mg 200 mL/hr over 60 Minutes Intravenous On call to O.R. 11/18/12 1412 11/19/12 0730   11/19/12 0600   metroNIDAZOLE (FLAGYL) IVPB 500 mg        500 mg 100 mL/hr over 60 Minutes Intravenous On call to O.R. 11/18/12 1412 11/19/12 0735   11/19/12 0600   ciprofloxacin (CIPRO) IVPB 400 mg  Status:  Discontinued        400 mg 200 mL/hr over 60 Minutes Intravenous On call to O.R. 11/18/12 1412 11/19/12 1144          Assessment/Plan: s/p Procedure(s) (LRB) with comments: LAPAROSCOPY DIAGNOSTIC (N/A) - Diagnostic laparoscopy LAPAROSCOPIC UMBILICAL HERNIA (N/A) - ventral hernia repair  with mesh Discharge  LOS: 1 day    Nicole Mata A. 11/20/2012

## 2012-11-20 NOTE — Progress Notes (Signed)
D/C home today. D/C instructions reviewed with pt, copy of instructions and scripts given to pt. Pt d/c'd via wheelchair with belongings with family and was escorted by hospital volunteer.

## 2012-11-20 NOTE — Discharge Summary (Signed)
Physician Discharge Summary  Patient ID: Nicole Mata MRN: [id number] DOB/AGE: [birth year] 51 y.o.  Admit date: 11/19/2012 Discharge date: 11/20/2012  Admission Diagnoses: ventral hernia /  abdominal pain  Discharge Diagnoses: same Active Problems:  * No active hospital problems. *    Discharged Condition: good  Hospital Course: unremarkable.  Consults: None  Significant Diagnostic Studies: none  Treatments: surgery: laparoscopy and repair of ventral hernia  Discharge Exam: Blood pressure 118/68, pulse 87, temperature 98 F (36.7 C), temperature source Oral, resp. rate 18, height 6' (1.829 m), weight 220 lb 14.4 oz (100.2 kg), SpO2 95.00%. Incision/Wound:intact incisions sore abdomen  Disposition: 01-Home or Self Care  Discharge Orders    Future Appointments: Provider: Department: Dept Phone: Center:   12/06/2012 10:00 AM Maisie Fus A. Margan Elias, MD [facility], [place] [phone] None       Medication List     As of 11/20/2012  7:42 AM    ASK your doctor about these medications         albuterol 108 (90 BASE) MCG/ACT inhaler   Commonly known as: PROVENTIL HFA;VENTOLIN HFA   Inhale 2 puffs into the lungs every 6 (six) hours as needed. For shortness of breath or wheezing      dexlansoprazole 60 MG capsule   Commonly known as: DEXILANT   Take 1 capsule (60 mg total) by mouth 2 (two) times daily.      dicyclomine 10 MG capsule   Commonly known as: BENTYL   Take one po BID      ketotifen 0.025 % ophthalmic solution   Commonly known as: ZADITOR   Place 1 drop into both eyes every morning.      levofloxacin 500 MG tablet   Commonly known as: LEVAQUIN   Take 1 tablet (500 mg total) by mouth daily.      meloxicam 7.5 MG tablet   Commonly known as: MOBIC   Take 7.5 mg by mouth as needed.      norethindrone-ethinyl estradiol 1-20 MG-MCG tablet   Commonly known as: MICROGESTIN,JUNEL,LOESTRIN   Take 1 tablet by mouth daily.      ranitidine 150 MG tablet    Commonly known as: ZANTAC   Take 1 tablet (150 mg total) by mouth at bedtime.         Signed: Kesean Serviss A. 11/20/2012, 7:42 AM

## 2012-11-21 ENCOUNTER — Encounter (HOSPITAL_COMMUNITY): Payer: Self-pay | Admitting: Surgery

## 2012-11-25 ENCOUNTER — Telehealth (INDEPENDENT_AMBULATORY_CARE_PROVIDER_SITE_OTHER): Payer: Self-pay | Admitting: General Surgery

## 2012-11-25 NOTE — Telephone Encounter (Signed)
Pt called to report her "taste is off" ever since her surgery for hernia repair.  She states she is "pretty sure" this is related to anesthesia, but not sure who to call.  Advised her to call [facility] anesthesia to report it and discuss what to expect.  She agrees and will call them.

## 2012-12-06 ENCOUNTER — Ambulatory Visit (INDEPENDENT_AMBULATORY_CARE_PROVIDER_SITE_OTHER): Payer: BC Managed Care – PPO | Admitting: Surgery

## 2012-12-06 ENCOUNTER — Encounter (INDEPENDENT_AMBULATORY_CARE_PROVIDER_SITE_OTHER): Payer: Self-pay | Admitting: Surgery

## 2012-12-06 VITALS — BP 122/80 | HR 96 | Temp 97.3°F | Resp 18 | Ht 66.52 in | Wt 214.0 lb

## 2012-12-06 DIAGNOSIS — Z9889 Other specified postprocedural states: Secondary | ICD-10-CM

## 2012-12-06 NOTE — Progress Notes (Signed)
Patient returns after laparoscopic ventral hernia repair. She's doing well. No further abdominal pain or issues with her bowel function. She does have some numbness in her right hand and sees a chiropractor for this. She has a history of cervical spine disease.  Exam: Abdomen soft nontender. Port site is clean dry and intact with mild bruising around the umbilicus. No hematoma or seroma.  Impression: Status post laparoscopic ventral hernia repair mesh for incarcerated ventral hernia with omentum  Plan: Return as needed. I've given her instructions about returning to activity. I will hold off seeing a chiropractor for 2 weeks.

## 2012-12-06 NOTE — Patient Instructions (Signed)
Return as needed.  Resume full activity in 4 weeks.  Can lift up to 15 lbs for 2 weeks,  25 lbs after that and full activity after that.

## 2012-12-16 ENCOUNTER — Telehealth (INDEPENDENT_AMBULATORY_CARE_PROVIDER_SITE_OTHER): Payer: Self-pay | Admitting: General Surgery

## 2012-12-16 NOTE — Telephone Encounter (Signed)
Pt called to report probable adverse reaction to adhesive.  Area are red, itchy and irritated.  Advised her to remove all remaining steri-strips and apply very thin layer of hydrocortisone BID.  If this is not helpful after two applications, stop using.  Call back for changes.

## 2012-12-18 ENCOUNTER — Telehealth (INDEPENDENT_AMBULATORY_CARE_PROVIDER_SITE_OTHER): Payer: Self-pay

## 2012-12-18 NOTE — Telephone Encounter (Addendum)
Patient called in with concerns of a little redness and rash type reaction where glue was on her skin. She tried cortisone cream. I told her that she could try benadryl and see if that helps. She has removed all the glue. I told her that it should get better over time and to watch it to make sure redness doesn't spread allover abdomin. She will call with worsening symptoms.

## 2012-12-26 ENCOUNTER — Ambulatory Visit (INDEPENDENT_AMBULATORY_CARE_PROVIDER_SITE_OTHER): Payer: BC Managed Care – PPO | Admitting: Family Medicine

## 2012-12-26 ENCOUNTER — Encounter: Payer: Self-pay | Admitting: Family Medicine

## 2012-12-26 VITALS — BP 124/76 | HR 82 | Temp 97.9°F | Resp 18 | Wt 216.0 lb

## 2012-12-26 DIAGNOSIS — R209 Unspecified disturbances of skin sensation: Secondary | ICD-10-CM

## 2012-12-26 DIAGNOSIS — L7682 Other postprocedural complications of skin and subcutaneous tissue: Secondary | ICD-10-CM

## 2012-12-26 DIAGNOSIS — L0390 Cellulitis, unspecified: Secondary | ICD-10-CM

## 2012-12-26 DIAGNOSIS — L0291 Cutaneous abscess, unspecified: Secondary | ICD-10-CM

## 2012-12-26 MED ORDER — MUPIROCIN 2 % EX OINT: TOPICAL_OINTMENT | Freq: Three times a day (TID) | CUTANEOUS | Status: DC

## 2012-12-26 NOTE — Progress Notes (Signed)
  Subjective:    Patient ID: Nicole Mata, female    DOB: 1960/12/20, 52 y.o.   MRN: [id number]  HPI She had hernia repair on December 17. She had exploratory laparotomy at the same time. She had non-small incisions. Since then she started to notice a lot of redness and itching. She, surgeon's office and they recommended they can show she got all the glue off. They felt she was probably having a reaction to the occluded. She did so contrast is much office possible. They also encouraged her use hydrocortisone over-the-counter which she has been using for last week and half. She says some of the lesions look better but some are still red and irritated and wanted to make sure that they're not infected. She's not noticed any active drainage from the wounds. They otherwise have been healing well. And she had a followup 2 weeks postop with her surgeon, at the time he felt that things were going well.   Review of Systems     Objective:   Physical Exam  Constitutional: She appears well-developed and well-nourished.  Skin: Skin is warm and dry.       She has several small incisions that overall seems to be healing well. The wounds are intact, dry and clean. I palpated each lesion to make sure he did not feel an abscess underneath. She does have a significant amount of surrounding erythema along the abdominal incision that is her largest incision. I did take a sterile swab and removed a little extra glue that was inside the umbilicus. No drainage there. She says that the redness around on the umbilical lesion is actually gotten a lot better than it was a week ago.  Psychiatric: She has a normal mood and affect. Her behavior is normal.          Assessment & Plan:  Cellulitis-I. do think there is a little bit of inflammation around these incisions. Most likely is just typical postop 4 weeks out. It may have also been an allergic reaction to the glue which sometimes can take a week to calm down. We'll  stop the hydrocortisone cream I will put her on Bactroban at least for the abdominal incision and see if this improves. If she wakes up and notices increased redness or increased area of redness now her call the office immediately and I will put her on an oral antibiotic. Also consider that she could be having some irritation inflammation from the subcutaneous sutures.

## 2012-12-27 ENCOUNTER — Other Ambulatory Visit: Payer: Self-pay | Admitting: Obstetrics & Gynecology

## 2012-12-27 DIAGNOSIS — Z1231 Encounter for screening mammogram for malignant neoplasm of breast: Secondary | ICD-10-CM

## 2012-12-30 ENCOUNTER — Ambulatory Visit (HOSPITAL_BASED_OUTPATIENT_CLINIC_OR_DEPARTMENT_OTHER)
Admission: RE | Admit: 2012-12-30 | Discharge: 2012-12-30 | Disposition: A | Discharge status: Home / Self Care | Payer: BC Managed Care – PPO | Source: Ambulatory Visit | Attending: Obstetrics & Gynecology | Admitting: Obstetrics & Gynecology

## 2012-12-30 DIAGNOSIS — Z1231 Encounter for screening mammogram for malignant neoplasm of breast: Secondary | ICD-10-CM | POA: Insufficient documentation

## 2013-01-03 ENCOUNTER — Encounter (INDEPENDENT_AMBULATORY_CARE_PROVIDER_SITE_OTHER): Payer: BC Managed Care – PPO | Admitting: Surgery

## 2013-01-09 ENCOUNTER — Other Ambulatory Visit: Payer: Self-pay | Admitting: Internal Medicine

## 2013-01-14 LAB — LIPID PANEL
HDL: 47 mg/dL (ref 35–70)
LDL Cholesterol: 91 mg/dL
Triglycerides: 86 mg/dL (ref 40–160)

## 2013-01-14 LAB — BASIC METABOLIC PANEL: Creatinine: 0.7 mg/dL (ref 0.5–1.1)

## 2013-01-23 ENCOUNTER — Encounter: Payer: Self-pay | Admitting: *Deleted

## 2013-02-05 ENCOUNTER — Ambulatory Visit (INDEPENDENT_AMBULATORY_CARE_PROVIDER_SITE_OTHER): Payer: BC Managed Care – PPO | Admitting: Obstetrics & Gynecology

## 2013-02-05 ENCOUNTER — Encounter: Payer: Self-pay | Admitting: Obstetrics & Gynecology

## 2013-02-05 VITALS — BP 144/94 | HR 88 | Resp 16 | Ht 67.0 in | Wt 219.0 lb

## 2013-02-05 DIAGNOSIS — Z124 Encounter for screening for malignant neoplasm of cervix: Secondary | ICD-10-CM

## 2013-02-05 DIAGNOSIS — Z01419 Encounter for gynecological examination (general) (routine) without abnormal findings: Secondary | ICD-10-CM

## 2013-02-05 DIAGNOSIS — Z1151 Encounter for screening for human papillomavirus (HPV): Secondary | ICD-10-CM

## 2013-02-05 NOTE — Progress Notes (Signed)
  Subjective:    Nicole Mata is a 52 y.o. female who presents for an annual exam. The patient has no complaints today. The patient is not currently sexually active. GYN screening history: last pap: approximate date 2012 and was normal. The patient wears seatbelts: yes. The patient participates in regular exercise: no. Has the patient ever been transfused or tattooed?: not asked. The patient reports that there is not domestic violence in her life.  Pt on OCPs due to hot flashes and still was mensturating.  The OCPs did improve her hot flashes.  The pt no longer has monthly menses.  We will stop the OCPs and evaluate what happens with her cycle.  If hot flashes return we will check [facility] and start HRT if this is what pt desires.  Menstrual History: OB History   Grav Para Term Preterm Abortions TAB SAB Ect Mult Living   1    1  1         Patient's last menstrual period was 01/18/2013.    The following portions of the patient's history were reviewed and updated as appropriate: allergies, current medications, past family history, past medical history, past social history, past surgical history and problem list.  Review of Systems Pertinent items are noted in HPI.    Objective:   Filed Vitals:   02/05/13 1028  BP: 144/94  Pulse: 88  Resp: 16  Height: 5\' 7"  (1.702 m)  Weight: 219 lb (99.338 kg)      Vitals:  WNL General appearance: alert, cooperative and no distress Head: Normocephalic, without obvious abnormality, atraumatic Eyes: negative Throat: lips, mucosa, and tongue normal; teeth and gums normal Lungs: clear to auscultation bilaterally Breasts: normal appearance, no masses or tenderness, No nipple retraction or dimpling, No nipple discharge or bleeding Heart: regular rate and rhythm Abdomen: soft, non-tender; bowel sounds normal; no masses,  no organomegaly Pelvic: cervix normal in appearance, external genitalia normal, no adnexal masses or tenderness, no bladder tenderness, no  cervical motion tenderness, perianal skin: no external genital warts noted, rectovaginal septum normal, urethra without abnormality or discharge, uterus normal size, shape, and consistency and vagina normal without discharge, + hemorrhoids; exam difficult secondary to habitus. Extremities: no edema, redness or tenderness in the calves or thighs Skin: no lesions or rash Lymph nodes: Axillary adenopathy: none    .    Assessment:    Healthy female exam.    Plan:     All questions answered. Await pap smear results. Thin prep Pap smear.  Mammogram up to date RTC 1 yr

## 2013-02-07 ENCOUNTER — Encounter: Payer: BC Managed Care – PPO | Admitting: Family Medicine

## 2013-02-24 ENCOUNTER — Encounter: Payer: Self-pay | Admitting: Family Medicine

## 2013-02-24 ENCOUNTER — Ambulatory Visit (INDEPENDENT_AMBULATORY_CARE_PROVIDER_SITE_OTHER): Payer: BC Managed Care – PPO | Admitting: Family Medicine

## 2013-02-24 VITALS — BP 121/76 | HR 90 | Ht 67.0 in | Wt 220.0 lb

## 2013-02-24 DIAGNOSIS — R7301 Impaired fasting glucose: Secondary | ICD-10-CM

## 2013-02-24 DIAGNOSIS — R7989 Other specified abnormal findings of blood chemistry: Secondary | ICD-10-CM

## 2013-02-24 DIAGNOSIS — Z0000 Encounter for general adult medical examination without abnormal findings: Secondary | ICD-10-CM

## 2013-02-24 DIAGNOSIS — E8881 Metabolic syndrome: Secondary | ICD-10-CM

## 2013-02-24 DIAGNOSIS — R946 Abnormal results of thyroid function studies: Secondary | ICD-10-CM

## 2013-02-24 LAB — TSH: TSH: 1.363 u[IU]/mL (ref 0.350–4.500)

## 2013-02-24 NOTE — Patient Instructions (Signed)
Keep up a regular exercise program and make sure you are eating a healthy diet Try to eat 4 servings of dairy a day, or if you are lactose intolerant take a calcium with vitamin D daily.  Your vaccines are up to date.  Encourage regular exercise and low-fat diet.

## 2013-02-24 NOTE — Progress Notes (Signed)
  Subjective:     Nicole Mata is a 52 y.o. female and is here for a comprehensive physical exam. The patient reports no problems.  She brought in a copy of blood work from work. She also brought a form for [place] to complete today. She has no specific complaints or concerns.  History   Social History  . Marital Status: Divorced    Spouse Name: N/A    Number of Children: 0  . Years of Education: N/A   Occupational History  . Customer Service.     Social History Main Topics  . Smoking status: Never Smoker   . Smokeless tobacco: Never Used  . Alcohol Use: No  . Drug Use: No  . Sexually Active: Yes     Comment: divorced, no kids, customer service, 2 caffeine drinks daily.   Other Topics Concern  . Not on file   Social History Narrative   1 can of soda a day.  No regular exercise.    Health Maintenance  Topic Date Due  . Influenza Vaccine  08/04/2013  . Pap Smear  02/06/2016  . Tetanus/tdap  12/04/2018    The following portions of the patient's history were reviewed and updated as appropriate: allergies, current medications, past family history, past medical history, past social history, past surgical history and problem list.  Review of Systems A comprehensive review of systems was negative.   Objective:    BP 121/76  Pulse 90  Ht 5\' 7"  (1.702 m)  Wt 220 lb (99.791 kg)  BMI 34.45 kg/m2  LMP 01/18/2013 General appearance: alert, cooperative and appears stated age Head: Normocephalic, without obvious abnormality, atraumatic Eyes: conj clear, EOMI PEERLA Ears: normal TM's and external ear canals both ears Nose: Nares normal. Septum midline. Mucosa normal. No drainage or sinus tenderness. Throat: lips, mucosa, and tongue normal; teeth and gums normal Neck: no adenopathy, no carotid bruit, no JVD, supple, symmetrical, trachea midline and thyroid not enlarged, symmetric, no tenderness/mass/nodules Back: symmetric, no curvature. ROM normal. No CVA tenderness. Lungs:  clear to auscultation bilaterally Heart: regular rate and rhythm, S1, S2 normal, no murmur, click, rub or gallop Abdomen: soft, non-tender; bowel sounds normal; no masses,  no organomegaly Extremities: extremities normal, atraumatic, no cyanosis or edema Pulses: 2+ and symmetric Skin: Skin color, texture, turgor normal. No rashes or lesions Lymph nodes: Cervical, supraclavicular, and axillary nodes normal. Neurologic: Alert and oriented X 3, normal strength and tone. Normal symmetric reflexes. Normal coordination and gait    Assessment:    Healthy female exam.     Plan:     See After Visit Summary for Counseling Recommendations  Keep up a regular exercise program and make sure you are eating a healthy diet Try to eat 4 servings of dairy a day, or if you are lactose intolerant take a calcium with vitamin D daily.  Your vaccines are up to date.   IFG- due to recheck hemoglobin A1c today. She was borderline 6 months ago. She recently had a fasting glucose of 103 on blood work done through [facility].  Abnormal thyroid test on blood work done through [facility]. Her T4 was 12.6 which was elevated on their scale. Will do a TSH and repeat free T4 and free T3 today. She has no prior history of thyroid problems.

## 2013-02-25 NOTE — Progress Notes (Signed)
Quick Note:  All labs are normal. ______ 

## 2013-03-04 ENCOUNTER — Emergency Department (HOSPITAL_BASED_OUTPATIENT_CLINIC_OR_DEPARTMENT_OTHER)
Admission: EM | Admit: 2013-03-04 | Discharge: 2013-03-05 | Disposition: A | Discharge status: Home / Self Care | Payer: BC Managed Care – PPO | Attending: Emergency Medicine | Admitting: Emergency Medicine

## 2013-03-04 ENCOUNTER — Encounter (HOSPITAL_BASED_OUTPATIENT_CLINIC_OR_DEPARTMENT_OTHER): Payer: Self-pay | Admitting: *Deleted

## 2013-03-04 DIAGNOSIS — R071 Chest pain on breathing: Secondary | ICD-10-CM | POA: Insufficient documentation

## 2013-03-04 DIAGNOSIS — Z79899 Other long term (current) drug therapy: Secondary | ICD-10-CM | POA: Insufficient documentation

## 2013-03-04 DIAGNOSIS — K219 Gastro-esophageal reflux disease without esophagitis: Secondary | ICD-10-CM | POA: Insufficient documentation

## 2013-03-04 DIAGNOSIS — R0789 Other chest pain: Secondary | ICD-10-CM

## 2013-03-04 DIAGNOSIS — Z8719 Personal history of other diseases of the digestive system: Secondary | ICD-10-CM | POA: Insufficient documentation

## 2013-03-04 DIAGNOSIS — Z8619 Personal history of other infectious and parasitic diseases: Secondary | ICD-10-CM | POA: Insufficient documentation

## 2013-03-04 NOTE — ED Provider Notes (Signed)
History     CSN: [id number]  Arrival date & time 03/04/13  2157   First MD Initiated Contact with Patient 03/04/13 2355      Chief Complaint  Patient presents with  . Chest Pain    (Consider location/radiation/quality/duration/timing/severity/associated sxs/prior treatment) [id number] y.o. Female with anterior chest pain tonight described as tight for an hour tight relieved by belching. Tightness 3-4/10 then resolved with occasional dull ache under sternum coming and going in minutes.  No associated symptoms of nausea, lightheaded, sob, sweating.  States she has had symptoms related to panic.  She went to gi and cardiology for follow up and was told it had to do with menopause and reflux.  She states she periodically gets these pains and goes to her doctor and "evertyhing is fine".  PMD Dr. Loney [facility] .  Currently chest is tender to palpation some.  Cardiology- Dr. Deborah Chalk.   Past Medical History  Diagnosis Date  . Hiatal hernia   . IBS (irritable bowel syndrome)   . GERD (gastroesophageal reflux disease)   . Helicobacter pylori gastritis   . Gastropathy     reactive  . Diverticulosis   . Ileitis   . PONV (postoperative nausea and vomiting)     Past Surgical History  Procedure Laterality Date  . Shoulder surgery  2002, 2003    bilateral for bone spurs and torn RC  . Cholecystectomy  2001  . Laparoscopy  11/19/2012    Procedure: LAPAROSCOPY DIAGNOSTIC;  Surgeon: Clovis Pu. Cornett, MD;  Location: [facility] OR;  Service: General;  Laterality: N/A;  Diagnostic laparoscopy  . Umbilical hernia repair  11/19/2012    Procedure: LAPAROSCOPIC UMBILICAL HERNIA;  Surgeon: Clovis Pu. Cornett, MD;  Location: [facility] OR;  Service: General;  Laterality: N/A;  ventral hernia repair with mesh    Family History  Problem Relation Age of Onset  . Heart disease      family disease  . Heart attack Father 50    grandparents  . Hypertension Father   . Hyperlipidemia Father   . Diabetes Maternal  Grandmother   . Diabetes Maternal Uncle   . Ovarian cancer Maternal Aunt     x 2  . Hypertension Mother   . Hyperlipidemia Mother   . Diabetes Brother   . Colon cancer Neg Hx   . Ovarian cancer Maternal Aunt   . Diabetes type II Mother     History  Substance Use Topics  . Smoking status: Never Smoker   . Smokeless tobacco: Never Used  . Alcohol Use: No    OB History   Grav Para Term Preterm Abortions TAB SAB Ect Mult Living   1    1  1          Review of Systems  Allergies  Penicillins; Sulfa drugs cross reactors; Contrast media; Morphine and related; Azithromycin; and Hydrocodone  Home Medications   Current Outpatient Rx  Name  Route  Sig  Dispense  Refill  . DEXILANT 60 MG capsule      TAKE ONE CAPSULE BY MOUTH TWICE DAILY   60 capsule   2     PATIENT SHOULD TAKE RX EVERY DAY, NOT PRN   . ketotifen (ZADITOR) 0.025 % ophthalmic solution   Both Eyes   Place 1 drop into both eyes every morning.         . meloxicam (MOBIC) 7.5 MG tablet   Oral   Take 7.5 mg by mouth as needed.         Marland Kitchen  mupirocin ointment (BACTROBAN) 2 %   Topical   Apply topically 3 (three) times daily.   15 g   0     BP 151/89  Pulse 98  Temp(Src) 99.1 F (37.3 C) (Oral)  Resp 18  Wt 220 lb (99.791 kg)  BMI 34.45 kg/m2  SpO2 98%  LMP 01/18/2013  Physical Exam  Nursing note and vitals reviewed. Constitutional: She appears well-developed and well-nourished.  HENT:  Head: Normocephalic and atraumatic.  Eyes: Conjunctivae and EOM are normal. Pupils are equal, round, and reactive to light.  Neck: Normal range of motion. Neck supple.  Cardiovascular: Normal rate, regular rhythm, normal heart sounds and intact distal pulses.   Pulmonary/Chest: Effort normal and breath sounds normal.  Abdominal: Soft. Bowel sounds are normal.  Musculoskeletal: Normal range of motion.  Neurological: She is alert.  Skin: Skin is warm and dry.  Psychiatric: She has a normal mood and affect.  Thought content normal.    ED Course  Procedures (including critical care time)  Labs Reviewed - No data to display No results found.   No diagnosis found.   Date: 03/05/2013  Rate: 88  Rhythm: normal sinus rhythm  QRS Axis: normal  Intervals: normal  ST/T Wave abnormalities: normal  Conduction Disutrbances:none  Narrative Interpretation:   Old EKG Reviewed: changes noted Results for orders placed during the hospital encounter of 03/04/13  CBC WITH DIFFERENTIAL      Result Value Range   WBC 11.9 (*) 4.0 - 10.5 K/uL   RBC 4.00  3.87 - 5.11 MIL/uL   Hemoglobin 12.2  12.0 - 15.0 g/dL   HCT 98.1 (*) 19.1 - 47.8 %   MCV 89.5  78.0 - 100.0 fL   MCH 30.5  26.0 - 34.0 pg   MCHC 34.1  30.0 - 36.0 g/dL   RDW 29.5  62.1 - 30.8 %   Platelets 316  150 - 400 K/uL   Neutrophils Relative 68  43 - 77 %   Neutro Abs 8.0 (*) 1.7 - 7.7 K/uL   Lymphocytes Relative 22  12 - 46 %   Lymphs Abs 2.6  0.7 - 4.0 K/uL   Monocytes Relative 8  3 - 12 %   Monocytes Absolute 0.9  0.1 - 1.0 K/uL   Eosinophils Relative 2  0 - 5 %   Eosinophils Absolute 0.3  0.0 - 0.7 K/uL   Basophils Relative 0  0 - 1 %   Basophils Absolute 0.0  0.0 - 0.1 K/uL  TROPONIN I      Result Value Range   Troponin I <0.30  <0.30 ng/mL  BASIC METABOLIC PANEL      Result Value Range   Sodium 139  135 - 145 mEq/L   Potassium 3.8  3.5 - 5.1 mEq/L   Chloride 102  96 - 112 mEq/L   CO2 27  19 - 32 mEq/L   Glucose, Bld 135 (*) 70 - 99 mg/dL   BUN 9  6 - 23 mg/dL   Creatinine, Ser 6.57  0.50 - 1.10 mg/dL   Calcium 9.2  8.4 - 84.6 mg/dL   GFR calc non Af Amer >90  >90 mL/min   GFR calc Af Amer >90  >90 mL/min      Patient presents tonight with atypical chest pain. She has been seen for this and evaluated in the past. She follows with her primary care doctor and has been seen by them for this. She has also been followed by  cardiology. She has a normal EKG with a normal workup here tonight. She is tender to palpation in the  left anterior chest. She is advised of symptoms it should cause her to return to the emergency department such as worsening of the pain in her chest. Otherwise she is to call her primary care Dr. tomorrow and be reevaluated.      Hilario Quarry, MD 03/05/13 0100

## 2013-03-04 NOTE — ED Notes (Signed)
MD at bedside. 

## 2013-03-04 NOTE — ED Notes (Signed)
Chest pain x 2 hours. Pain in her left chest into her neck. Tingling into her head. Same 3 years ago and had a negative cardiac workup and told her it was the result of GERD. Belching a lot.

## 2013-03-05 ENCOUNTER — Emergency Department (HOSPITAL_BASED_OUTPATIENT_CLINIC_OR_DEPARTMENT_OTHER): Payer: BC Managed Care – PPO

## 2013-03-05 LAB — CBC WITH DIFFERENTIAL/PLATELET
Basophils Relative: 0 % (ref 0–1)
HCT: 35.8 % — ABNORMAL LOW (ref 36.0–46.0)
Hemoglobin: 12.2 g/dL (ref 12.0–15.0)
Lymphocytes Relative: 22 % (ref 12–46)
Lymphs Abs: 2.6 10*3/uL (ref 0.7–4.0)
MCHC: 34.1 g/dL (ref 30.0–36.0)
Monocytes Absolute: 0.9 10*3/uL (ref 0.1–1.0)
Monocytes Relative: 8 % (ref 3–12)
Neutro Abs: 8 10*3/uL — ABNORMAL HIGH (ref 1.7–7.7)
Neutrophils Relative %: 68 % (ref 43–77)
RBC: 4 MIL/uL (ref 3.87–5.11)
WBC: 11.9 10*3/uL — ABNORMAL HIGH (ref 4.0–10.5)

## 2013-03-05 LAB — BASIC METABOLIC PANEL
BUN: 9 mg/dL (ref 6–23)
CO2: 27 mEq/L (ref 19–32)
Chloride: 102 mEq/L (ref 96–112)
GFR calc non Af Amer: >90 mL/min (ref >90)
Glucose, Bld: 135 mg/dL — ABNORMAL HIGH (ref 70–99)
Potassium: 3.8 mEq/L (ref 3.5–5.1)
Sodium: 139 mEq/L (ref 135–145)

## 2013-03-05 MED ORDER — ASPIRIN 81 MG PO CHEW
324.0000 mg | CHEWABLE_TABLET | Freq: Once | ORAL | Status: AC
  Administered 2013-03-05: 324 mg via ORAL
  Filled 2013-03-05: qty 4

## 2013-03-05 MED ORDER — GI COCKTAIL ~~LOC~~
30.0000 mL | Freq: Once | ORAL | Status: AC
  Administered 2013-03-05: 30 mL via ORAL
  Filled 2013-03-05: qty 30

## 2013-03-05 NOTE — ED Notes (Signed)
pateint back from X-ray.

## 2013-04-12 DIAGNOSIS — S82891A Other fracture of right lower leg, initial encounter for closed fracture: Secondary | ICD-10-CM

## 2013-04-12 HISTORY — DX: Other fracture of right lower leg, initial encounter for closed fracture: S82.891A

## 2013-04-13 ENCOUNTER — Encounter (HOSPITAL_BASED_OUTPATIENT_CLINIC_OR_DEPARTMENT_OTHER): Payer: Self-pay | Admitting: *Deleted

## 2013-04-13 ENCOUNTER — Emergency Department (HOSPITAL_BASED_OUTPATIENT_CLINIC_OR_DEPARTMENT_OTHER)
Admission: EM | Admit: 2013-04-13 | Discharge: 2013-04-13 | Disposition: A | Discharge status: Home / Self Care | Payer: BC Managed Care – PPO | Attending: Emergency Medicine | Admitting: Emergency Medicine

## 2013-04-13 ENCOUNTER — Emergency Department (HOSPITAL_BASED_OUTPATIENT_CLINIC_OR_DEPARTMENT_OTHER): Payer: BC Managed Care – PPO

## 2013-04-13 DIAGNOSIS — Y939 Activity, unspecified: Secondary | ICD-10-CM | POA: Insufficient documentation

## 2013-04-13 DIAGNOSIS — Z79899 Other long term (current) drug therapy: Secondary | ICD-10-CM | POA: Insufficient documentation

## 2013-04-13 DIAGNOSIS — Y92009 Unspecified place in unspecified non-institutional (private) residence as the place of occurrence of the external cause: Secondary | ICD-10-CM | POA: Insufficient documentation

## 2013-04-13 DIAGNOSIS — K219 Gastro-esophageal reflux disease without esophagitis: Secondary | ICD-10-CM | POA: Insufficient documentation

## 2013-04-13 DIAGNOSIS — Z8719 Personal history of other diseases of the digestive system: Secondary | ICD-10-CM | POA: Insufficient documentation

## 2013-04-13 DIAGNOSIS — S8262XA Displaced fracture of lateral malleolus of left fibula, initial encounter for closed fracture: Secondary | ICD-10-CM

## 2013-04-13 DIAGNOSIS — W1789XA Other fall from one level to another, initial encounter: Secondary | ICD-10-CM | POA: Insufficient documentation

## 2013-04-13 DIAGNOSIS — S8263XA Displaced fracture of lateral malleolus of unspecified fibula, initial encounter for closed fracture: Secondary | ICD-10-CM | POA: Insufficient documentation

## 2013-04-13 DIAGNOSIS — Z880 Allergy status to penicillin: Secondary | ICD-10-CM | POA: Insufficient documentation

## 2013-04-13 DIAGNOSIS — Z8619 Personal history of other infectious and parasitic diseases: Secondary | ICD-10-CM | POA: Insufficient documentation

## 2013-04-13 MED ORDER — IBUPROFEN 400 MG PO TABS: 400.0000 mg | ORAL_TABLET | Freq: Four times a day (QID) | ORAL | Status: DC | PRN

## 2013-04-13 MED ORDER — HYDROCODONE-ACETAMINOPHEN 5-325 MG PO TABS: 1.0000 | ORAL_TABLET | Freq: Four times a day (QID) | ORAL | Status: DC | PRN

## 2013-04-13 NOTE — ED Notes (Signed)
Pt states that she fell about 3 feet today and injured her right foot and ankle. PMS intact. Ice applied.

## 2013-04-13 NOTE — ED Provider Notes (Signed)
History    Scribed for Derwood Kaplan, MD, the patient was seen in room MHT13/MHT13. This chart was scribed by Lewanda Rife, ED scribe. Patient's care was started at 1953   CSN: [id number]  Arrival date & time 04/13/13  1734   First MD Initiated Contact with Patient 04/13/13 1951      Chief Complaint  Patient presents with  . Ankle Injury    (Consider location/radiation/quality/duration/timing/severity/associated sxs/prior treatment) The history is provided by the patient.   HPI Comments: Nicole Mata is a 52 y.o. female who presents to the Emergency Department complaining of constant moderate right ankle pain onset 2.5 hours after falling 3 feet down her basement. Reports increased swelling, and pain. Reports pain is aggravated with weight baring and alleviated by nothing. Denies taking any medicine to treat pain at home. Denies hx of ankle surgery. Reports hx of past ankle sprain.    Past Medical History  Diagnosis Date  . Hiatal hernia   . IBS (irritable bowel syndrome)   . GERD (gastroesophageal reflux disease)   . Helicobacter pylori gastritis   . Gastropathy     reactive  . Diverticulosis   . Ileitis   . PONV (postoperative nausea and vomiting)     Past Surgical History  Procedure Laterality Date  . Shoulder surgery  2002, 2003    bilateral for bone spurs and torn RC  . Cholecystectomy  2001  . Laparoscopy  11/19/2012    Procedure: LAPAROSCOPY DIAGNOSTIC;  Surgeon: Clovis Pu. Cornett, MD;  Location: [facility] OR;  Service: General;  Laterality: N/A;  Diagnostic laparoscopy  . Umbilical hernia repair  11/19/2012    Procedure: LAPAROSCOPIC UMBILICAL HERNIA;  Surgeon: Clovis Pu. Cornett, MD;  Location: [facility] OR;  Service: General;  Laterality: N/A;  ventral hernia repair with mesh    Family History  Problem Relation Age of Onset  . Heart disease      family disease  . Heart attack Father 50    grandparents  . Hypertension Father   . Hyperlipidemia Father   .  Diabetes Maternal Grandmother   . Diabetes Maternal Uncle   . Ovarian cancer Maternal Aunt     x 2  . Hypertension Mother   . Hyperlipidemia Mother   . Diabetes Brother   . Colon cancer Neg Hx   . Ovarian cancer Maternal Aunt   . Diabetes type II Mother     History  Substance Use Topics  . Smoking status: Never Smoker   . Smokeless tobacco: Never Used  . Alcohol Use: No    OB History   Grav Para Term Preterm Abortions TAB SAB Ect Mult Living   1    1  1          Review of Systems  Musculoskeletal: Positive for joint swelling and gait problem.  All other systems reviewed and are negative.   A complete 10 system review of systems was obtained and all systems are negative except as noted in the HPI and PMH.    Allergies  Penicillins; Sulfa drugs cross reactors; Contrast media; Morphine and related; Azithromycin; and Hydrocodone  Home Medications   Current Outpatient Rx  Name  Route  Sig  Dispense  Refill  . DEXILANT 60 MG capsule      TAKE ONE CAPSULE BY MOUTH TWICE DAILY   60 capsule   2     PATIENT SHOULD TAKE RX EVERY DAY, NOT PRN   . ketotifen (ZADITOR) 0.025 % ophthalmic solution  Both Eyes   Place 1 drop into both eyes every morning.         . meloxicam (MOBIC) 7.5 MG tablet   Oral   Take 7.5 mg by mouth as needed.         . mupirocin ointment (BACTROBAN) 2 %   Topical   Apply topically 3 (three) times daily.   15 g   0     BP 140/72  Pulse 90  Temp(Src) 98.7 F (37.1 C) (Oral)  Resp 20  Ht 5\' 7"  (1.702 m)  Wt 225 lb (102.059 kg)  BMI 35.23 kg/m2  SpO2 97%  Physical Exam  Nursing note and vitals reviewed. Constitutional: She is oriented to person, place, and time. She appears well-developed and well-nourished. No distress.  HENT:  Head: Normocephalic and atraumatic.  Eyes: EOM are normal.  Neck: Neck supple. No tracheal deviation present.  Cardiovascular: Normal rate, regular rhythm and normal heart sounds.   Pulmonary/Chest:  Effort normal and breath sounds normal. No respiratory distress. She has no wheezes.  Musculoskeletal: She exhibits tenderness.       Right ankle: She exhibits decreased range of motion (secondary to pain ) and swelling. She exhibits no ecchymosis and no deformity. Tenderness (anterior ankle tenderness ). Lateral malleolus and medial malleolus tenderness found. Achilles tendon normal.  No navicular tenderness of right ankle  Neurological: She is alert and oriented to person, place, and time.  Skin: Skin is warm and dry.  Psychiatric: She has a normal mood and affect. Her behavior is normal.    ED Course  Procedures (including critical care time) Medications - No data to display  Labs Reviewed - No data to display Dg Ankle Complete Right  04/13/2013  *RADIOLOGY REPORT*  Clinical Data: Fall.  Lateral ankle pain.  RIGHT ANKLE - COMPLETE 3+ VIEW  Comparison: Foot radiographs today.  Findings: There is an oblique minimally displaced lateral malleolar fracture.  This is not visualized on the oblique view but can be seen on the lateral and frontal views.  The displacement is about one cortex width medially relative to the metaphysis.  The ankle mortise remains congruent.  The talar dome is intact.  There is about one cortex with posterior displacement of the lateral malleolar fragment.  Calcaneal spurs incidentally noted.  No calcaneal fracture.  IMPRESSION: Oblique minimally displaced lateral malleolar fracture.   Original Report Authenticated By: Andreas Newport, M.D.    Dg Knee Complete 4 Views Right  04/13/2013  *RADIOLOGY REPORT*  Clinical Data: Fall, pain.  Injury.  RIGHT KNEE - COMPLETE 4+ VIEW  Comparison: None  Findings: No acute bony abnormality.  Specifically, no fracture, subluxation, or dislocation.  Soft tissues are intact. Joint spaces are maintained.  Normal bone mineralization.  No joint effusion.  IMPRESSION: No acute bony abnormality.   Original Report Authenticated By: Charlett Nose, M.D.     Dg Foot Complete Right  04/13/2013  *RADIOLOGY REPORT*  Clinical Data: Fall.  Right foot injury.  RIGHT FOOT COMPLETE - 3+ VIEW  Comparison: None.  Findings: Alignment bones of the foot is within normal limits. Lateral malleolar fracture partially visualized.  This will be addressed in conjunction with ankle radiographs.  Hind foot soft tissue swelling is present.  The fifth metatarsal appears within normal limits.  IMPRESSION: Negative radiographs of the foot.  See ankle dictation for lateral malleolar fracture.   Original Report Authenticated By: Andreas Newport, M.D.      1. Ankle fracture, lateral malleolus, closed, left,  initial encounter       MDM  I personally performed the services described in this documentation, which was scribed in my presence. The recorded information has been reviewed and is accurate.  Pt comes in with cc of fall and resultant ankle swelling and pain. Xray ordered - and shows medial malleolar fracture. Will get CAM boot and have orthopedic follow up. Non weight bearing.  Derwood Kaplan, MD 04/21/13 [phone]

## 2013-04-16 ENCOUNTER — Ambulatory Visit: Payer: BC Managed Care – PPO | Admitting: Obstetrics & Gynecology

## 2013-06-04 ENCOUNTER — Encounter: Payer: Self-pay | Admitting: Obstetrics & Gynecology

## 2013-06-04 ENCOUNTER — Ambulatory Visit (INDEPENDENT_AMBULATORY_CARE_PROVIDER_SITE_OTHER): Payer: BC Managed Care – PPO | Admitting: Obstetrics & Gynecology

## 2013-06-04 VITALS — BP 123/74 | HR 98 | Resp 16 | Ht 66.0 in | Wt 222.0 lb

## 2013-06-04 DIAGNOSIS — N951 Menopausal and female climacteric states: Secondary | ICD-10-CM | POA: Insufficient documentation

## 2013-06-04 NOTE — Progress Notes (Signed)
Pt presents wanting HRT.  Pt was on OCPs until recently.  Pt has had no menses since stopping (LMP March 2014).  Pt having night sweats, decreased energy, feeling down.  Pt is not anemic and TSH is nml  Check FSH first, if menopausal then will start HRT.  Risks reviewed of HRT including but not limited to CAD, blood clot, risk of breast cancer.  If not menopausal will restart OCPs.  Health Maintenance up to date (pap / mammo)  RN Chestine Spore will call with results and prescription

## 2013-06-05 ENCOUNTER — Telehealth: Payer: Self-pay | Admitting: *Deleted

## 2013-06-05 ENCOUNTER — Other Ambulatory Visit: Payer: Self-pay | Admitting: Obstetrics & Gynecology

## 2013-06-05 MED ORDER — ESTRADIOL-NORETHINDRONE ACET 0.05-0.14 MG/DAY TD PTTW: 1.0000 | MEDICATED_PATCH | TRANSDERMAL | Status: DC

## 2013-06-05 NOTE — Telephone Encounter (Signed)
Pt notified of FSH levels which were menopausal.  Will send a RX for HRT to her pharmacy.

## 2013-08-06 ENCOUNTER — Encounter: Payer: Self-pay | Admitting: Obstetrics & Gynecology

## 2013-08-06 ENCOUNTER — Ambulatory Visit (INDEPENDENT_AMBULATORY_CARE_PROVIDER_SITE_OTHER): Payer: BC Managed Care – PPO | Admitting: Obstetrics & Gynecology

## 2013-08-06 VITALS — BP 130/84 | HR 90 | Resp 16 | Ht 66.0 in | Wt 233.0 lb

## 2013-08-06 DIAGNOSIS — N912 Amenorrhea, unspecified: Secondary | ICD-10-CM

## 2013-08-06 NOTE — Progress Notes (Signed)
52 year old female presents inquiring about BTL.  Pt has a new partner and wants to make sure she can't become pregnant.  Pt is in menopause bsed on lab values.  She was on OCPs until 52, but since stopping she has not had a menses.  She is on HRT which is helping her hot flashes.  Pt has had spotting x1 in teh first 3 months of starting HRT.  Suggested pt and new partner be tested for STDs (blood and GC/Chlam).  They will think about this. Pt up to date with pap.  RTC prn.

## 2013-09-10 ENCOUNTER — Ambulatory Visit (INDEPENDENT_AMBULATORY_CARE_PROVIDER_SITE_OTHER): Payer: BC Managed Care – PPO | Admitting: Physician Assistant

## 2013-09-10 ENCOUNTER — Encounter: Payer: Self-pay | Admitting: Physician Assistant

## 2013-09-10 VITALS — BP 131/77 | HR 86 | Wt 232.0 lb

## 2013-09-10 DIAGNOSIS — S82891A Other fracture of right lower leg, initial encounter for closed fracture: Secondary | ICD-10-CM | POA: Insufficient documentation

## 2013-09-10 DIAGNOSIS — J019 Acute sinusitis, unspecified: Secondary | ICD-10-CM

## 2013-09-10 MED ORDER — FLUTICASONE PROPIONATE 50 MCG/ACT NA SUSP: 2.0000 | Freq: Every day | NASAL | Status: AC

## 2013-09-10 MED ORDER — DOXYCYCLINE HYCLATE 100 MG PO CAPS: 100.0000 mg | ORAL_CAPSULE | Freq: Two times a day (BID) | ORAL | Status: DC

## 2013-09-10 NOTE — Patient Instructions (Signed)

## 2013-09-10 NOTE — Progress Notes (Signed)
  Subjective:    Patient ID: Nicole Mata, female    DOB: Apr 27, 1961, 52 y.o.   MRN: [id number]  HPI Patient is a 52 year old female who presents to the clinic with sinus pressure and dizziness. Patient reports about a week and a half of on and off facial pain, sinus pressure, congestion. She has been using Mucinex DM regularly and does seem to be helping some. Yesterday morning she woke up in the facial pain and sinus pressure was much worse. She is concerned because she is going on a trip to the mountains with her fianc and wants to be able to enjoy her time. She's also developed dizziness when she turns her head side to side. She has a mild cough with no production. She has had some ear itchiness but no pain. She denies any nausea, vomiting, fever or wheezing.   Review of Systems     Objective:   Physical Exam  Constitutional: She is oriented to person, place, and time. She appears well-developed and well-nourished.  HENT:  Head: Normocephalic and atraumatic.  Bilateral TMs are slightly bulging with a fullness at the base of team bilaterally. Still able to see ossicles. Some erythema in canal.   Bilateral maxillary tenderness to palpation.  Bilateral nasal turbinates red and swollen.  Eyes: Conjunctivae are normal.  Neck: Normal range of motion. Neck supple.  Cardiovascular: Normal rate, regular rhythm and normal heart sounds.   Pulmonary/Chest: Effort normal and breath sounds normal. She has no wheezes.  Lymphadenopathy:    She has no cervical adenopathy.  Neurological: She is alert and oriented to person, place, and time.  Skin: Skin is warm and dry.  Psychiatric: She has a normal mood and affect. Her behavior is normal.          Assessment & Plan:  Sinusitis-patient has allergies to penicillins, azithromycin and sulfa. I did give doxycycline for 10 days. I also included a nasal spray to help her ears to drain and the fluid. Can continue using Mucinex. Did give handout  with other symptomatic care to consider. Call if not improving or getting worse.

## 2013-10-09 ENCOUNTER — Other Ambulatory Visit: Payer: Self-pay

## 2013-10-13 ENCOUNTER — Encounter: Payer: Self-pay | Admitting: Family Medicine

## 2013-10-13 ENCOUNTER — Ambulatory Visit (INDEPENDENT_AMBULATORY_CARE_PROVIDER_SITE_OTHER): Payer: BC Managed Care – PPO | Admitting: Family Medicine

## 2013-10-13 ENCOUNTER — Other Ambulatory Visit: Payer: Self-pay | Admitting: Internal Medicine

## 2013-10-13 ENCOUNTER — Telehealth: Payer: Self-pay | Admitting: *Deleted

## 2013-10-13 VITALS — BP 125/68 | HR 94 | Temp 97.6°F | Wt 237.0 lb

## 2013-10-13 DIAGNOSIS — R0789 Other chest pain: Secondary | ICD-10-CM

## 2013-10-13 DIAGNOSIS — R0602 Shortness of breath: Secondary | ICD-10-CM

## 2013-10-13 DIAGNOSIS — J019 Acute sinusitis, unspecified: Secondary | ICD-10-CM

## 2013-10-13 MED ORDER — DOXYCYCLINE HYCLATE 100 MG PO CAPS: 100.0000 mg | ORAL_CAPSULE | Freq: Two times a day (BID) | ORAL | Status: DC

## 2013-10-13 NOTE — Patient Instructions (Signed)

## 2013-10-13 NOTE — Progress Notes (Signed)
Subjective:    Patient ID: Nicole Mata, female    DOB: Apr 17, 1961, 52 y.o.   MRN: [id number]  HPI Sxs x 1 week of nasal congestion, post nasal drip and tightness in chest.  + dry cough.  Some mucous in the AM.  No fever, chills or sweats.  Ears with pressure and ST started today.  No OTC cough cold medication. Has been getting some occassional Heartburn. Did start drinking soda again recently.   She's been having some intermittent sharp chest pain on the left side for the last 3 months. She says it can last seconds to up to 10-15 minutes at the most. He can happen with rest or activity. No alleviating symptoms. No exacerbating symptoms. It happened only several times a week. She says it feels just like a sharp stinging pain in the breast area. Her last mammogram was in January and was normal. She has not felt any lumps or bumps. She is not diaphoretic and there is no radiation of pain with the chest pain. She has had some more frequent heart burn recently after starting to increase her soda intake.   Review of Systems  BP 125/68  Pulse 94  Temp(Src) 97.6 F (36.4 C)  Wt 237 lb (107.502 kg)    Allergies  Allergen Reactions  . Penicillins Anaphylaxis  . Sulfa Drugs Cross Reactors Anaphylaxis  . Contrast Media [Iodinated Diagnostic Agents] Hives  . Morphine And Related     Pt chooses not to take it  . Azithromycin Other (See Comments)    Makes her feel really crazy.  Marland Kitchen Hydrocodone Nausea And Vomiting    Past Medical History  Diagnosis Date  . Hiatal hernia   . IBS (irritable bowel syndrome)   . GERD (gastroesophageal reflux disease)   . Helicobacter pylori gastritis   . Gastropathy     reactive  . Diverticulosis   . Ileitis   . PONV (postoperative nausea and vomiting)     Past Surgical History  Procedure Laterality Date  . Shoulder surgery  2002, 2003    bilateral for bone spurs and torn RC  . Cholecystectomy  2001  . Laparoscopy  11/19/2012    Procedure: LAPAROSCOPY  DIAGNOSTIC;  Surgeon: Clovis Pu. Cornett, MD;  Location: [facility] OR;  Service: General;  Laterality: N/A;  Diagnostic laparoscopy  . Umbilical hernia repair  11/19/2012    Procedure: LAPAROSCOPIC UMBILICAL HERNIA;  Surgeon: Clovis Pu. Cornett, MD;  Location: [facility] OR;  Service: General;  Laterality: N/A;  ventral hernia repair with mesh    History   Social History  . Marital Status: Divorced    Spouse Name: N/A    Number of Children: 0  . Years of Education: N/A   Occupational History  . Customer Service.     Social History Main Topics  . Smoking status: Never Smoker   . Smokeless tobacco: Never Used  . Alcohol Use: No  . Drug Use: No  . Sexual Activity: Yes     Comment: divorced, no kids, customer service, 2 caffeine drinks daily.   Other Topics Concern  . Not on file   Social History Narrative   1 can of soda a day.  No regular exercise.     Family History  Problem Relation Age of Onset  . Heart disease      family disease  . Heart attack Father 50    grandparents  . Hypertension Father   . Hyperlipidemia Father   . Diabetes Maternal Grandmother   .  Diabetes Maternal Uncle   . Ovarian cancer Maternal Aunt     x 2  . Hypertension Mother   . Hyperlipidemia Mother   . Diabetes Brother   . Colon cancer Neg Hx   . Ovarian cancer Maternal Aunt   . Diabetes type II Mother     Outpatient Encounter Prescriptions as of 10/13/2013  Medication Sig  . DEXILANT 60 MG capsule TAKE ONE CAPSULE BY MOUTH TWICE DAILY  . doxycycline (VIBRAMYCIN) 100 MG capsule Take 1 capsule (100 mg total) by mouth 2 (two) times daily.  Marland Kitchen estradiol-norethindrone (COMBIPATCH) 0.05-0.14 MG/DAY Place 1 patch onto the skin 2 (two) times a week.  . fluticasone (FLONASE) 50 MCG/ACT nasal spray Place 2 sprays into the nose daily.  Marland Kitchen ibuprofen (ADVIL,MOTRIN) 400 MG tablet Take 1 tablet (400 mg total) by mouth every 6 (six) hours as needed for pain.  Marland Kitchen ketotifen (ZADITOR) 0.025 % ophthalmic solution Place 1 drop  into both eyes every morning.  . meloxicam (MOBIC) 15 MG tablet   . [DISCONTINUED] doxycycline (VIBRAMYCIN) 100 MG capsule Take 1 capsule (100 mg total) by mouth 2 (two) times daily.          Objective:   Physical Exam  Constitutional: She is oriented to person, place, and time. She appears well-developed and well-nourished.  HENT:  Head: Normocephalic and atraumatic.  Right Ear: External ear normal.  Left Ear: External ear normal.  Nose: Nose normal.  Mouth/Throat: Oropharynx is clear and moist.  TMs and canals are clear.   Eyes: Conjunctivae and EOM are normal. Pupils are equal, round, and reactive to light.  Neck: Neck supple. No thyromegaly present.  Cardiovascular: Normal rate, regular rhythm and normal heart sounds.   No carotid bruit  Pulmonary/Chest: Effort normal and breath sounds normal. She has no wheezes.  Lymphadenopathy:    She has no cervical adenopathy.  Neurological: She is alert and oriented to person, place, and time.  Skin: Skin is warm and dry.  Psychiatric: She has a normal mood and affect.          Assessment & Plan:  Acute sinusitis - will treat with doxycycline since she is penicillin and sulfa allergic and cannot tolerate azithromycin. Call if not significantly better in one wee. Continue symptomatic care.  Atypical Chest Pain - EKG shows rate of 86 beats per minute, normal sinus rhythm, normal axis. No acute ST-T wave changes. I suspect that her chest pain is noncardiac, but we'll get lab work today including thyroid, CBC to evaluate for anemia as well as a troponin. Her pain is mostly concentrated on the left breast her mammogram is normal from January. Let me know chest pain not resolving if blood work is normal.

## 2013-10-14 ENCOUNTER — Other Ambulatory Visit: Payer: Self-pay | Admitting: Family Medicine

## 2013-10-14 DIAGNOSIS — R7989 Other specified abnormal findings of blood chemistry: Secondary | ICD-10-CM

## 2013-10-14 DIAGNOSIS — R0789 Other chest pain: Secondary | ICD-10-CM

## 2013-10-14 LAB — COMPLETE METABOLIC PANEL WITH GFR
ALT: 18 U/L (ref 0–35)
AST: 19 U/L (ref 0–37)
Albumin: 3.8 g/dL (ref 3.5–5.2)
BUN: 11 mg/dL (ref 6–23)
CO2: 29 mEq/L (ref 19–32)
Calcium: 9.3 mg/dL (ref 8.4–10.5)
Chloride: 100 mEq/L (ref 96–112)
GFR, Est African American: >89 mL/min
Potassium: 4.1 mEq/L (ref 3.5–5.3)

## 2013-10-14 LAB — CBC WITH DIFFERENTIAL/PLATELET
Eosinophils Absolute: 0.3 10*3/uL (ref 0.0–0.7)
Eosinophils Relative: 3 % (ref 0–5)
Hemoglobin: 12.4 g/dL (ref 12.0–15.0)
Lymphocytes Relative: 29 % (ref 12–46)
Lymphs Abs: 2.6 10*3/uL (ref 0.7–4.0)
MCH: 29 pg (ref 26.0–34.0)
MCV: 86 fL (ref 78.0–100.0)
Monocytes Relative: 7 % (ref 3–12)
Platelets: 384 10*3/uL (ref 150–400)
RBC: 4.28 MIL/uL (ref 3.87–5.11)
WBC: 9 10*3/uL (ref 4.0–10.5)

## 2013-10-14 LAB — TSH: TSH: 1.316 u[IU]/mL (ref 0.350–4.500)

## 2013-10-14 LAB — D-DIMER, QUANTITATIVE: D-Dimer, Quant: 0.53 ug/mL-FEU — ABNORMAL HIGH (ref 0.00–0.48)

## 2013-10-16 ENCOUNTER — Telehealth: Payer: Self-pay | Admitting: *Deleted

## 2013-10-16 ENCOUNTER — Other Ambulatory Visit: Payer: Self-pay | Admitting: Family Medicine

## 2013-10-16 ENCOUNTER — Ambulatory Visit (HOSPITAL_COMMUNITY): Admission: RE | Admit: 2013-10-16 | Payer: BC Managed Care – PPO | Source: Ambulatory Visit

## 2013-10-16 DIAGNOSIS — R0789 Other chest pain: Secondary | ICD-10-CM

## 2013-10-16 DIAGNOSIS — R791 Abnormal coagulation profile: Secondary | ICD-10-CM

## 2013-10-16 DIAGNOSIS — R7989 Other specified abnormal findings of blood chemistry: Secondary | ICD-10-CM

## 2013-10-16 DIAGNOSIS — Z91010 Allergy to peanuts: Secondary | ICD-10-CM

## 2013-10-16 NOTE — Telephone Encounter (Signed)
VQ scan order per Dr. Linford Arnold due to protocol for allergies to contrast.  Meyer Cory, LPN

## 2013-10-16 NOTE — Telephone Encounter (Signed)
Pt calling about ct appt .Nicole Mata

## 2013-10-17 NOTE — Telephone Encounter (Signed)
Call pt: VQ is neg. No blood clot.

## 2013-10-20 ENCOUNTER — Telehealth: Payer: Self-pay

## 2013-10-20 DIAGNOSIS — M5030 Other cervical disc degeneration, unspecified cervical region: Secondary | ICD-10-CM

## 2013-10-20 NOTE — Telephone Encounter (Signed)
Orders placed for repeat left-sided C7-T1 interlaminar epidural steroid injection.

## 2013-10-20 NOTE — Telephone Encounter (Signed)
Patient called wanted to know if she can get a order placed for another epidural for her bulging disc in her neck. Issam Carlyon,CMA

## 2013-10-20 NOTE — Telephone Encounter (Signed)
Patient has been notified the order was placed for Epidural injection. Chanon Loney,CMA

## 2013-10-20 NOTE — Telephone Encounter (Signed)
Pt informed.Nicole Mata Nicole Mata  

## 2013-10-29 ENCOUNTER — Ambulatory Visit: Payer: BC Managed Care – PPO | Admitting: Nurse Practitioner

## 2013-10-29 ENCOUNTER — Ambulatory Visit
Admission: RE | Admit: 2013-10-29 | Discharge: 2013-10-29 | Disposition: A | Discharge status: Home / Self Care | Payer: BC Managed Care – PPO | Source: Ambulatory Visit | Attending: Sports Medicine | Admitting: Sports Medicine

## 2013-10-29 ENCOUNTER — Ambulatory Visit (INDEPENDENT_AMBULATORY_CARE_PROVIDER_SITE_OTHER): Payer: BC Managed Care – PPO | Admitting: Physician Assistant

## 2013-10-29 ENCOUNTER — Encounter: Payer: Self-pay | Admitting: Physician Assistant

## 2013-10-29 VITALS — BP 116/70 | HR 92 | Ht 66.0 in | Wt 238.0 lb

## 2013-10-29 VITALS — BP 141/78 | HR 90

## 2013-10-29 DIAGNOSIS — K219 Gastro-esophageal reflux disease without esophagitis: Secondary | ICD-10-CM

## 2013-10-29 DIAGNOSIS — E669 Obesity, unspecified: Secondary | ICD-10-CM

## 2013-10-29 MED ORDER — IOHEXOL 300 MG/ML  SOLN
1.0000 mL | Freq: Once | INTRAMUSCULAR | Status: AC | PRN
  Administered 2013-10-29: 1 mL via EPIDURAL

## 2013-10-29 MED ORDER — TRIAMCINOLONE ACETONIDE 40 MG/ML IJ SUSP (RADIOLOGY)
60.0000 mg | Freq: Once | INTRAMUSCULAR | Status: AC
  Administered 2013-10-29: 60 mg via EPIDURAL

## 2013-10-29 MED ORDER — DEXLANSOPRAZOLE 60 MG PO CPDR: DELAYED_RELEASE_CAPSULE | ORAL | Status: DC

## 2013-10-29 NOTE — Patient Instructions (Signed)
We sent a prescription for Dexilant for twice daily to [facility], [address].

## 2013-10-29 NOTE — Progress Notes (Signed)
Subjective:    Patient ID: Nicole Mata, female    DOB: 12/03/1961, 52 y.o.   MRN: [id number]  HPI  Nicole Mata is a very nice 52 year old female known to Dr. [name] who has history of chronic GERD, IBS, and had undergone repair of an umbilical hernia in December of 2013. She is maintained on chronic Dexilant  For acid reflux and comes in today for refills. She states that her symptoms are well-controlled as long as she takes the medication. She denies any heartburn indigestion or dysphagia. She says she has very occasional sharp pains in her midabdomen but those been present for years without any change and she says  generally transient. She has no complaints of changes in her bowel habits melena or hematochezia. Family history is negative for colon cancer. Patient is status post colonoscopy in October of 2008 which was a normal exam.    Review of Systems  Constitutional: Negative.   HENT: Negative.   Eyes: Negative.   Respiratory: Negative.   Gastrointestinal: Positive for abdominal pain.  Endocrine: Negative.   Genitourinary: Negative.   Musculoskeletal: Negative.   Skin: Negative.   Allergic/Immunologic: Negative.   Neurological: Negative.   Hematological: Negative.   Psychiatric/Behavioral: Negative.      Outpatient Prescriptions Prior to Visit  Medication Sig Dispense Refill  . estradiol-norethindrone (COMBIPATCH) 0.05-0.14 MG/DAY Place 1 patch onto the skin 2 (two) times a week.  8 patch  12  . fluticasone (FLONASE) 50 MCG/ACT nasal spray Place 2 sprays into the nose daily.  16 g  2  . ketotifen (ZADITOR) 0.025 % ophthalmic solution Place 1 drop into both eyes every morning.      Marland Kitchen DEXILANT 60 MG capsule TAKE ONE CAPSULE BY MOUTH TWICE DAILY  60 capsule  2  . doxycycline (VIBRAMYCIN) 100 MG capsule Take 1 capsule (100 mg total) by mouth 2 (two) times daily.  20 capsule  0  . ibuprofen (ADVIL,MOTRIN) 400 MG tablet Take 1 tablet (400 mg total) by mouth every 6 (six) hours as  needed for pain.  30 tablet  0  . meloxicam (MOBIC) 15 MG tablet        No facility-administered medications prior to visit.   Allergies  Allergen Reactions  . Penicillins Anaphylaxis  . Sulfa Drugs Cross Reactors Anaphylaxis  . Contrast Media [Iodinated Diagnostic Agents] Hives  . Morphine And Related     Pt chooses not to take it  . Azithromycin Other (See Comments)    Makes her feel really crazy.  Marland Kitchen Hydrocodone Nausea And Vomiting   Patient Active Problem List   Diagnosis Date Noted  . Ankle fracture, right 09/10/2013  . Menopausal symptom 06/04/2013  . Umbilical hernia 10/14/2012  . Small bowel disease 10/14/2012  . Insulin resistance 02/07/2012  . Obese 02/06/2012  . Cervical radiculopathy at C7 01/16/2011  . GERD 12/17/2008  . HIATAL HERNIA 12/17/2008  . IRRITABLE BOWEL SYNDROME 12/17/2008  . HELICOBACTER PYLORI GASTRITIS, HX OF 12/17/2008   History  Substance Use Topics  . Smoking status: Never Smoker   . Smokeless tobacco: Never Used  . Alcohol Use: No   family history includes Diabetes in her brother, maternal grandmother, and maternal uncle; Diabetes type II in her mother; Heart attack (age of onset: 74) in her father; Heart disease in an other family member; Hyperlipidemia in her father and mother; Hypertension in her father and mother; Ovarian cancer in her maternal aunt and maternal aunt. There is no history of Colon  cancer.     Objective:   Physical Exam well-developed white female in no acute distress, pleasant blood pressure 116/70 pulse 92 height 5 foot 6 weight 238. HEENT; nontraumatic normocephalic EOMI PERRLA sclera anicteric, Cardiovascular; regular rate and rhythm with S1-S2 no murmur or gallop, Pulmonary ;clear bilaterally, Abdomen ;soft nontender nondistended bowel sounds are active there is no palpable mass or hepatosplenomegaly, Rectal; exam not done, Neuro/ psych; patient mood and affect appropriate        Assessment & Plan:  #73  52 year old  female with chronic GERD symptoms controlled on current regimen #2 IBS #3 colon neoplasia surveillance-normal colonoscopy 2008 at average risk will be due for followup exam in 2018  Plan; refilled Dexilant  60 mg by mouth daily with refills x1 year She will  followup with Dr. Juanda Chance  as needed

## 2013-10-29 NOTE — Progress Notes (Signed)
Reviewed and agree.

## 2013-11-21 ENCOUNTER — Ambulatory Visit (INDEPENDENT_AMBULATORY_CARE_PROVIDER_SITE_OTHER): Payer: BC Managed Care – PPO | Admitting: Family Medicine

## 2013-11-21 VITALS — BP 129/81 | HR 100 | Temp 98.0°F | Wt 239.0 lb

## 2013-11-21 DIAGNOSIS — J309 Allergic rhinitis, unspecified: Secondary | ICD-10-CM

## 2013-11-21 DIAGNOSIS — K219 Gastro-esophageal reflux disease without esophagitis: Secondary | ICD-10-CM

## 2013-11-21 DIAGNOSIS — IMO0001 Reserved for inherently not codable concepts without codable children: Secondary | ICD-10-CM

## 2013-11-21 DIAGNOSIS — M5412 Radiculopathy, cervical region: Secondary | ICD-10-CM

## 2013-11-21 LAB — POCT GLYCOSYLATED HEMOGLOBIN (HGB A1C): Hemoglobin A1C: 7.1

## 2013-11-21 MED ORDER — MELOXICAM 15 MG PO TABS: 15.0000 mg | ORAL_TABLET | Freq: Every day | ORAL | Status: DC

## 2013-11-21 MED ORDER — AMBULATORY NON FORMULARY MEDICATION: Status: DC

## 2013-11-21 MED ORDER — CYCLOBENZAPRINE HCL 10 MG PO TABS: 10.0000 mg | ORAL_TABLET | Freq: Every evening | ORAL | Status: AC | PRN

## 2013-11-21 NOTE — Patient Instructions (Signed)
Diabetes and Exercise Exercising regularly is important. It is not just about losing weight. It has many health benefits, such as:  Improving your overall fitness, flexibility, and endurance.  Increasing your bone density.  Helping with weight control.  Decreasing your body fat.  Increasing your muscle strength.  Reducing stress and tension.  Improving your overall health. People with diabetes who exercise gain additional benefits because exercise:  Reduces appetite.  Improves the body's use of blood sugar (glucose).  Helps lower or control blood glucose.  Decreases blood pressure.  Helps control blood lipids (such as cholesterol and triglycerides).  Improves the body's use of the hormone insulin by:  Increasing the body's insulin sensitivity.  Reducing the body's insulin needs.  Decreases the risk for heart disease because exercising:  Lowers cholesterol and triglycerides levels.  Increases the levels of good cholesterol (such as high-density lipoproteins [HDL]) in the body.  Lowers blood glucose levels. YOUR ACTIVITY PLAN  Choose an activity that you enjoy and set realistic goals. Your health care provider or diabetes educator can help you make an activity plan that works for you. You can break activities into 2 or 3 sessions throughout the day. Doing so is as good as one long session. Exercise ideas include:  Taking the dog for a walk.  Taking the stairs instead of the elevator.  Dancing to your favorite song.  Doing your favorite exercise with a friend. RECOMMENDATIONS FOR EXERCISING WITH TYPE 1 OR TYPE 2 DIABETES   Check your blood glucose before exercising. If blood glucose levels are greater than 240 mg/dL, check for urine ketones. Do not exercise if ketones are present.  Avoid injecting insulin into areas of the body that are going to be exercised. For example, avoid injecting insulin into:  The arms when playing tennis.  The legs when  jogging.  Keep a record of:  Food intake before and after you exercise.  Expected peak times of insulin action.  Blood glucose levels before and after you exercise.  The type and amount of exercise you have done.  Review your records with your health care provider. Your health care provider will help you to develop guidelines for adjusting food intake and insulin amounts before and after exercising.  If you take insulin or oral hypoglycemic agents, watch for signs and symptoms of hypoglycemia. They include:  Dizziness.  Shaking.  Sweating.  Chills.  Confusion.  Drink plenty of water while you exercise to prevent dehydration or heat stroke. Body water is lost during exercise and must be replaced.  Talk to your health care provider before starting an exercise program to make sure it is safe for you. Remember, almost any type of activity is better than none. Document Released: 02/10/2004 Document Revised: 07/23/2013 Document Reviewed: 04/29/2013 [employer] Patient Information 2014 [employer]. Diabetes Meal Planning Guide The diabetes meal planning guide is a tool to help you plan your meals and snacks. It is important for people with diabetes to manage their blood glucose (sugar) levels. Choosing the right foods and the right amounts throughout your day will help control your blood glucose. Eating right can even help you improve your blood pressure and reach or maintain a healthy weight. CARBOHYDRATE COUNTING MADE EASY When you eat carbohydrates, they turn to sugar. This raises your blood glucose level. Counting carbohydrates can help you control this level so you feel better. When you plan your meals by counting carbohydrates, you can have more flexibility in what you eat and balance your medicine   with your food intake. Carbohydrate counting simply means adding up the total amount of carbohydrate grams in your meals and snacks. Try to eat about the same amount at each meal. Foods  with carbohydrates are listed below. Each portion below is 1 carbohydrate serving or 15 grams of carbohydrates. Ask your dietician how many grams of carbohydrates you should eat at each meal or snack. Grains and Starches  1 slice bread.   English muffin or hotdog/hamburger bun.   cup cold cereal (unsweetened).   cup cooked pasta or rice.   cup starchy vegetables (corn, potatoes, peas, beans, winter squash).  1 tortilla (6 inches).   bagel.  1 waffle or pancake (size of a CD).   cup cooked cereal.  4 to 6 small crackers. *Whole grain is recommended. Fruit  1 cup fresh unsweetened berries, melon, papaya, pineapple.  1 small fresh fruit.   banana or mango.   cup fruit juice (4 oz unsweetened).   cup canned fruit in natural juice or water.  2 tbs dried fruit.  12 to 15 grapes or cherries. Milk and Yogurt  1 cup fat-free or 1% milk.  1 cup soy milk.  6 oz light yogurt with sugar-free sweetener.  6 oz low-fat soy yogurt.  6 oz plain yogurt. Vegetables  1 cup raw or  cup cooked is counted as 0 carbohydrates or a "free" food.  If you eat 3 or more servings at 1 meal, count them as 1 carbohydrate serving. Other Carbohydrates   oz chips or pretzels.   cup ice cream or frozen yogurt.   cup sherbet or sorbet.  2 inch square cake, no frosting.  1 tbs honey, sugar, jam, jelly, or syrup.  2 small cookies.  3 squares of graham crackers.  3 cups popcorn.  6 crackers.  1 cup broth-based soup.  Count 1 cup casserole or other mixed foods as 2 carbohydrate servings.  Foods with less than 20 calories in a serving may be counted as 0 carbohydrates or a "free" food. You may want to purchase a book or computer software that lists the carbohydrate gram counts of different foods. In addition, the nutrition facts panel on the labels of the foods you eat are a good source of this information. The label will tell you how big the serving size is and the  total number of carbohydrate grams you will be eating per serving. Divide this number by 15 to obtain the number of carbohydrate servings in a portion. Remember, 1 carbohydrate serving equals 15 grams of carbohydrate. SERVING SIZES Measuring foods and serving sizes helps you make sure you are getting the right amount of food. The list below tells how big or small some common serving sizes are.  1 oz.........4 stacked dice.  3 oz.........Deck of cards.  1 tsp........Tip of little finger.  1 tbs........Thumb.  2 tbs........Golf ball.   cup.......Half of a fist.  1 cup........A fist. SAMPLE DIABETES MEAL PLAN Below is a sample meal plan that includes foods from the grain and starches, dairy, vegetable, fruit, and meat groups. A dietician can individualize a meal plan to fit your calorie needs and tell you the number of servings needed from each food group. However, controlling the total amount of carbohydrates in your meal or snack is more important than making sure you include all of the food groups at every meal. You may interchange carbohydrate containing foods (dairy, starches, and fruits). The meal plan below is an example of a 2000 calorie diet   using carbohydrate counting. This meal plan has 17 carbohydrate servings. Breakfast  1 cup oatmeal (2 carb servings).   cup light yogurt (1 carb serving).  1 cup blueberries (1 carb serving).   cup almonds. Snack  1 large apple (2 carb servings).  1 low-fat string cheese stick. Lunch  Chicken breast salad.  1 cup spinach.   cup chopped tomatoes.  2 oz chicken breast, sliced.  2 tbs low-fat Italian dressing.  12 whole-wheat crackers (2 carb servings).  12 to 15 grapes (1 carb serving).  1 cup low-fat milk (1 carb serving). Snack  1 cup carrots.   cup hummus (1 carb serving). Dinner  3 oz broiled salmon.  1 cup brown rice (3 carb servings). Snack  1  cups steamed broccoli (1 carb serving) drizzled with 1  tsp olive oil and lemon juice.  1 cup light pudding (2 carb servings). DIABETES MEAL PLANNING WORKSHEET Your dietician can use this worksheet to help you decide how many servings of foods and what types of foods are right for you.  BREAKFAST Food Group and Servings / Carb Servings Grain/Starches __________________________________ Dairy __________________________________________ Vegetable ______________________________________ Fruit ___________________________________________ Meat __________________________________________ Fat ____________________________________________ LUNCH Food Group and Servings / Carb Servings Grain/Starches ___________________________________ Dairy ___________________________________________ Fruit ____________________________________________ Meat ___________________________________________ Fat _____________________________________________ DINNER Food Group and Servings / Carb Servings Grain/Starches ___________________________________ Dairy ___________________________________________ Fruit ____________________________________________ Meat ___________________________________________ Fat _____________________________________________ SNACKS Food Group and Servings / Carb Servings Grain/Starches ___________________________________ Dairy ___________________________________________ Vegetable _______________________________________ Fruit ____________________________________________ Meat ___________________________________________ Fat _____________________________________________ DAILY TOTALS Starches _________________________ Vegetable ________________________ Fruit ____________________________ Dairy ____________________________ Meat ____________________________ Fat ______________________________ Document Released: 08/17/2005 Document Revised: 02/12/2012 Document Reviewed: 06/28/2009 [employer] Patient Information 2014 [employer].  

## 2013-11-21 NOTE — Progress Notes (Signed)
Subjective:    Patient ID: Nicole Mata, female    DOB: 1961/11/22, 52 y.o.   MRN: [id number]  HPI          Shoulder Pain left shoulder had injection after thanksgiving and she now is having some arm pain she has taken mobic for this which helps with the pain.  Called and got another injection before Thanksgiving.  Last injection for C7 radiculopahty was about a year ago.  Started seeing the chiropractor and that helps as well. Motrin PM helps her get rest.  Still getting a lot of chest pain into her chest and radiates into her back.      Still having sinus symptoms - still having a lot of drainage and pressure in her sinuses.  Has started again about 1 months. She is on fluticasone.  Bilateral sinus pain and HA.  No fever, chills ore sweats.  Ears were huritng last weekend but better today.   Her fasting glucose-last A1c was 6.0 back in March. She's due for repeat today. She has noticed some increased thirst over the last couple of months. No polyuria urea she does have a strong family history of diabetes.  Review of Systems  BP 129/81  Pulse 100  Temp(Src) 98 F (36.7 C)  Wt 239 lb (108.41 kg)    Allergies  Allergen Reactions  . Penicillins Anaphylaxis  . Sulfa Drugs Cross Reactors Anaphylaxis  . Contrast Media [Iodinated Diagnostic Agents] Hives  . Morphine And Related     Pt chooses not to take it  . Azithromycin Other (See Comments)    Makes her feel really crazy.  Marland Kitchen Hydrocodone Nausea And Vomiting    Past Medical History  Diagnosis Date  . Hiatal hernia   . IBS (irritable bowel syndrome)   . GERD (gastroesophageal reflux disease)   . Helicobacter pylori gastritis   . Gastropathy     reactive  . Diverticulosis   . Ileitis   . PONV (postoperative nausea and vomiting)     Past Surgical History  Procedure Laterality Date  . Shoulder surgery  2002, 2003    bilateral for bone spurs and torn RC  . Cholecystectomy  2001  . Laparoscopy  11/19/2012    Procedure:  LAPAROSCOPY DIAGNOSTIC;  Surgeon: Clovis Pu. Cornett, MD;  Location: [facility] OR;  Service: General;  Laterality: N/A;  Diagnostic laparoscopy  . Umbilical hernia repair  11/19/2012    Procedure: LAPAROSCOPIC UMBILICAL HERNIA;  Surgeon: Clovis Pu. Cornett, MD;  Location: [facility] OR;  Service: General;  Laterality: N/A;  ventral hernia repair with mesh    History   Social History  . Marital Status: Divorced    Spouse Name: N/A    Number of Children: 0  . Years of Education: N/A   Occupational History  . Customer Service.     Social History Main Topics  . Smoking status: Never Smoker   . Smokeless tobacco: Never Used  . Alcohol Use: No  . Drug Use: No  . Sexual Activity: Yes     Comment: divorced, no kids, customer service, 2 caffeine drinks daily.   Other Topics Concern  . Not on file   Social History Narrative   1 can of soda a day.  No regular exercise.     Family History  Problem Relation Age of Onset  . Heart disease      family disease  . Heart attack Father 50    grandparents  . Hypertension Father   .  Hyperlipidemia Father   . Diabetes Maternal Grandmother   . Diabetes Maternal Uncle   . Ovarian cancer Maternal Aunt     x 2  . Hypertension Mother   . Hyperlipidemia Mother   . Diabetes Brother   . Colon cancer Neg Hx   . Ovarian cancer Maternal Aunt   . Diabetes type II Mother     Outpatient Encounter Prescriptions as of 11/21/2013  Medication Sig  . dexlansoprazole (DEXILANT) 60 MG capsule Take 1 tab twice daily.  Marland Kitchen estradiol-norethindrone (COMBIPATCH) 0.05-0.14 MG/DAY Place 1 patch onto the skin 2 (two) times a week.  . fluticasone (FLONASE) 50 MCG/ACT nasal spray Place 2 sprays into the nose daily.  Marland Kitchen ketotifen (ZADITOR) 0.025 % ophthalmic solution Place 1 drop into both eyes every morning.  . AMBULATORY NON FORMULARY MEDICATION Medication Name: Glucometer, lancets, strips. Test once a day. Diagnosis 250.00  . cyclobenzaprine (FLEXERIL) 10 MG tablet Take 1 tablet  (10 mg total) by mouth at bedtime as needed for muscle spasms.  . meloxicam (MOBIC) 15 MG tablet Take 1 tablet (15 mg total) by mouth daily.          Objective:   Physical Exam  Constitutional: She is oriented to person, place, and time. She appears well-developed and well-nourished.  HENT:  Head: Normocephalic and atraumatic.  Right Ear: External ear normal.  Left Ear: External ear normal.  Nose: Nose normal.  Mouth/Throat: Oropharynx is clear and moist.  TMs and canals are clear.   Eyes: Conjunctivae and EOM are normal. Pupils are equal, round, and reactive to light.  Neck: Neck supple. No thyromegaly present.  Cardiovascular: Normal rate, regular rhythm and normal heart sounds.   Pulmonary/Chest: Effort normal and breath sounds normal. She has no wheezes.  Musculoskeletal:  Neck w/ NROM.  Shoulder with NROM. Nontender.    Lymphadenopathy:    She has no cervical adenopathy.  Neurological: She is alert and oriented to person, place, and time.  Skin: Skin is warm and dry.  Psychiatric: She has a normal mood and affect.          Assessment & Plan:  Left shoulder pain/cervical radiculopathy at C7 - will refill her medications as well as her muscle relaxer for bedtime use. She's not feeling better for the next month and recommend she followup with Dr. Benjamin Stain for further evaluation. She did get some improvement after her injection.  Sinusitis - likley AR. Add zyrtec to her flonase. Call if not better or getting worse and will put her on an antibiotic.   GERD- Can't afford the Dexilant right now. Given samples.   DM-  - New Dx. discussed the importance of diet and exercise. Review of the combination of diabetic diet. Offered to refer her for her diabetic nutrition counseling. She says she will do her best to try ago. She prefers to go locally if possible. We also discussed possibly starting a medication but she really wants to work on diet and exercise first. Thus, I will  see her back in 3 months and we'll see how she's doing. She's been impaired fasting glucose for couple years but this is the first time she's had an A1c higher than 6.4.

## 2013-11-25 ENCOUNTER — Encounter: Payer: Self-pay | Admitting: Family Medicine

## 2013-11-25 DIAGNOSIS — E118 Type 2 diabetes mellitus with unspecified complications: Secondary | ICD-10-CM | POA: Insufficient documentation

## 2013-11-25 DIAGNOSIS — E1149 Type 2 diabetes mellitus with other diabetic neurological complication: Secondary | ICD-10-CM | POA: Insufficient documentation

## 2014-01-09 ENCOUNTER — Ambulatory Visit: Payer: BC Managed Care – PPO | Admitting: Family Medicine

## 2014-01-14 ENCOUNTER — Encounter: Payer: Self-pay | Admitting: Physician Assistant

## 2014-01-14 ENCOUNTER — Ambulatory Visit (INDEPENDENT_AMBULATORY_CARE_PROVIDER_SITE_OTHER): Payer: BC Managed Care – PPO | Admitting: Physician Assistant

## 2014-01-14 ENCOUNTER — Ambulatory Visit: Payer: BC Managed Care – PPO | Admitting: Family Medicine

## 2014-01-14 VITALS — BP 148/75 | HR 91 | Wt 234.0 lb

## 2014-01-14 DIAGNOSIS — R109 Unspecified abdominal pain: Secondary | ICD-10-CM

## 2014-01-14 DIAGNOSIS — E119 Type 2 diabetes mellitus without complications: Secondary | ICD-10-CM

## 2014-01-14 LAB — POCT URINALYSIS DIPSTICK
BILIRUBIN UA: NEGATIVE
Glucose, UA: NEGATIVE
Leukocytes, UA: NEGATIVE
Nitrite, UA: NEGATIVE
Protein, UA: 30
Spec Grav, UA: >=1.03
Urobilinogen, UA: 0.2
pH, UA: 6.5

## 2014-01-14 LAB — POCT GLYCOSYLATED HEMOGLOBIN (HGB A1C): Hemoglobin A1C: 6.5

## 2014-01-14 MED ORDER — AMBULATORY NON FORMULARY MEDICATION: Status: DC

## 2014-01-14 MED ORDER — METFORMIN HCL 500 MG PO TABS: 500.0000 mg | ORAL_TABLET | Freq: Two times a day (BID) | ORAL | Status: DC

## 2014-01-14 NOTE — Progress Notes (Signed)
   Subjective:    Patient ID: Nicole Mata, female    DOB: [birth year], 53 y.o.   MRN: [id number]  HPI Patient is a 53 year old female who presents to the clinic to followup on diabetes. At last visit her A1c was 7.1. She is currently not on any medications. She has started checking her morning sugars which have been averaging around 118 for the last 30 days via Her glucometer. She has been exercising more regularly as well as watching her sugar and carbohydrate load. She has lost 5 pounds since last visit. She denies any hypoglycemic events. She denies any dizziness, vision changes, headaches or neuropathy.  Patient does briefly mention any abdominal pain started on the left side about 30 minutes ago when she walked to the clinic. It was sharp and sudden. It has since resolved with no treatment. She denies any dysuria, he increasing urinary frequency, urinary urgency. She did have a bowel movement this morning with no straining. There is no blood in stool. She denies any nausea or vomiting. She has not ran a fever.      Review of Systems     Objective:   Physical Exam  Constitutional: She is oriented to person, place, and time. She appears well-developed and well-nourished.  HENT:  Head: Normocephalic and atraumatic.  Eyes: Conjunctivae are normal.  Neck: Normal range of motion. Neck supple. No thyromegaly present.  Cardiovascular: Normal rate, regular rhythm and normal heart sounds.   Pulmonary/Chest: Effort normal and breath sounds normal. She has no wheezes.  Abdominal: Soft. Bowel sounds are normal. She exhibits no distension and no mass. There is no tenderness. There is no rebound and no guarding.  Neurological: She is alert and oriented to person, place, and time.  Skin: Skin is dry.  Psychiatric: She has a normal mood and affect. Her behavior is normal.          Assessment & Plan:  Diabetes Mellitus- A1C down from 7.1 to 6.5. Discussed with patient that she is moving in  the right direction. She is still considered a diabetic at this point however very controlled. I discussed that we could not do anything and hold off another 3 months to see which way blood sugars are trending with diet and exercise or we could add metformin twice a day. Discussed metformin and the potential GI side effects. I also talked about how there might be some additional weight loss benefit. Patient opted to start metformin twice a day and follow up in 3 months. Encouraged patient as she works on weight loss we could consider taking off metformin. Patient concerned about hypoglycemia. Reassured her that with metformin there should not be any hypoglycemia.  Abdominal pain-check a urine which was positive for small blood but negative for leuks or nitrates. She has been spotting with her period occasionally. Will culture urine. Reassured pt likely just a random pain/muscle spasm since no other symptoms. If worsens or comes back please follow up.

## 2014-01-14 NOTE — Patient Instructions (Signed)
Start metformin twice a day. Follow up in 3 months.

## 2014-01-16 LAB — URINE CULTURE

## 2014-01-22 ENCOUNTER — Other Ambulatory Visit: Payer: Self-pay | Admitting: Obstetrics & Gynecology

## 2014-01-22 DIAGNOSIS — Z1231 Encounter for screening mammogram for malignant neoplasm of breast: Secondary | ICD-10-CM

## 2014-01-26 ENCOUNTER — Ambulatory Visit (HOSPITAL_BASED_OUTPATIENT_CLINIC_OR_DEPARTMENT_OTHER)
Admission: RE | Admit: 2014-01-26 | Discharge: 2014-01-26 | Disposition: A | Discharge status: Home / Self Care | Payer: BC Managed Care – PPO | Source: Ambulatory Visit | Attending: Obstetrics & Gynecology | Admitting: Obstetrics & Gynecology

## 2014-01-26 DIAGNOSIS — Z1231 Encounter for screening mammogram for malignant neoplasm of breast: Secondary | ICD-10-CM

## 2014-01-27 LAB — LIPID PANEL
Cholesterol: 163 mg/dL (ref 0–200)
HDL: 44 mg/dL (ref 35–70)
LDL CALC: 101 mg/dL
Triglycerides: 90 mg/dL (ref 40–160)

## 2014-01-27 LAB — CBC AND DIFFERENTIAL
HEMATOCRIT: 37 % (ref 36–46)
HEMOGLOBIN: 12.5 g/dL (ref 12.0–16.0)
PLATELETS: 364 10*3/uL (ref 150–399)
WBC: 7.6 10*3/mL

## 2014-01-27 LAB — HEPATIC FUNCTION PANEL
ALT: 40 U/L — AB (ref 7–35)
AST: 50 U/L — AB (ref 13–35)

## 2014-01-27 LAB — BASIC METABOLIC PANEL
BUN: 10 mg/dL (ref 4–21)
Creatinine: 0.7 mg/dL (ref 0.5–1.1)
GLUCOSE: 129 mg/dL
Potassium: 4.3 mmol/L (ref 3.4–5.3)
Sodium: 141 mmol/L (ref 137–147)

## 2014-01-27 LAB — T4
Thyroxine (T4): 9.4
Uric Acid: 5.5

## 2014-02-18 ENCOUNTER — Ambulatory Visit: Payer: BC Managed Care – PPO | Admitting: Obstetrics & Gynecology

## 2014-02-24 ENCOUNTER — Ambulatory Visit: Payer: BC Managed Care – PPO | Admitting: Obstetrics & Gynecology

## 2014-02-27 ENCOUNTER — Ambulatory Visit (INDEPENDENT_AMBULATORY_CARE_PROVIDER_SITE_OTHER): Payer: BC Managed Care – PPO | Admitting: Physician Assistant

## 2014-02-27 ENCOUNTER — Encounter: Payer: Self-pay | Admitting: Physician Assistant

## 2014-02-27 VITALS — BP 127/81 | HR 99 | Temp 97.9°F | Ht 66.0 in | Wt 222.0 lb

## 2014-02-27 DIAGNOSIS — J329 Chronic sinusitis, unspecified: Secondary | ICD-10-CM

## 2014-02-27 DIAGNOSIS — A499 Bacterial infection, unspecified: Secondary | ICD-10-CM

## 2014-02-27 DIAGNOSIS — B9689 Other specified bacterial agents as the cause of diseases classified elsewhere: Secondary | ICD-10-CM

## 2014-02-27 MED ORDER — DOXYCYCLINE HYCLATE 100 MG PO TABS: 100.0000 mg | ORAL_TABLET | Freq: Two times a day (BID) | ORAL | Status: DC

## 2014-02-27 MED ORDER — METHYLPREDNISOLONE SODIUM SUCC 125 MG IJ SOLR
125.0000 mg | Freq: Once | INTRAMUSCULAR | Status: AC
  Administered 2014-02-27: 125 mg via INTRAMUSCULAR

## 2014-02-27 NOTE — Progress Notes (Signed)
   Subjective:    Patient ID: Nicole Mata, female    DOB: January 10, 1961, 53 y.o.   MRN: [id number]  HPI Pt presents to the clinic with right sided pain and pressure for over a week. She has had nasal drainage, dry cough, ear pressure, and ST as well. She been using tylenol, benadryl, flonase, sudafed with minimal relief. Denies any fever, chills, n/v/d. She has a lot of right sided facial pain.    Review of Systems     Objective:   Physical Exam  Constitutional: She is oriented to person, place, and time. She appears well-developed and well-nourished.  HENT:  Head: Normocephalic and atraumatic.  Right Ear: External ear normal.  Left Ear: External ear normal.  Mouth/Throat: Oropharynx is clear and moist.  TM's injected but no blood or pus.  Right maxillary and frontal sinuses tender to palpation.  Nasal turbinates red and swollen bilaterally.   Eyes: Conjunctivae are normal. Right eye exhibits no discharge. Left eye exhibits no discharge.  Neck: Normal range of motion. Neck supple.  Cardiovascular: Normal rate, regular rhythm and normal heart sounds.   Pulmonary/Chest: Effort normal and breath sounds normal. She has no wheezes.  Lymphadenopathy:    She has no cervical adenopathy.  Neurological: She is alert and oriented to person, place, and time.  Skin: Skin is dry.  Psychiatric: She has a normal mood and affect. Her behavior is normal.          Assessment & Plan:  Bacterial sinusitis- shot of solu-medrol 125mg  given IM today. Doxy for 10 days due to PCN/sulfa/zpak allergy. Gave HO and discussed symptomatic care. Continue on flonase as well. Follow up if not improving.

## 2014-02-27 NOTE — Patient Instructions (Signed)

## 2014-03-26 ENCOUNTER — Ambulatory Visit (INDEPENDENT_AMBULATORY_CARE_PROVIDER_SITE_OTHER): Payer: BC Managed Care – PPO | Admitting: Obstetrics & Gynecology

## 2014-03-26 ENCOUNTER — Encounter: Payer: Self-pay | Admitting: Obstetrics & Gynecology

## 2014-03-26 VITALS — BP 113/73 | HR 89 | Resp 16 | Ht 67.0 in | Wt 230.0 lb

## 2014-03-26 DIAGNOSIS — R300 Dysuria: Secondary | ICD-10-CM

## 2014-03-26 DIAGNOSIS — Z01419 Encounter for gynecological examination (general) (routine) without abnormal findings: Secondary | ICD-10-CM

## 2014-03-26 DIAGNOSIS — Z113 Encounter for screening for infections with a predominantly sexual mode of transmission: Secondary | ICD-10-CM

## 2014-03-26 DIAGNOSIS — R309 Painful micturition, unspecified: Secondary | ICD-10-CM

## 2014-03-26 LAB — POCT URINALYSIS DIPSTICK
BILIRUBIN UA: NEGATIVE
Glucose, UA: NEGATIVE
Ketones, UA: NEGATIVE
Leukocytes, UA: NEGATIVE
Nitrite, UA: NEGATIVE
Urobilinogen, UA: 0.2
pH, UA: 5

## 2014-03-26 NOTE — Progress Notes (Signed)
  Subjective:    Nicole Mata is a 53 y.o. female who presents for an annual exam. The patient has no complaints today. The patient is sexually active. GYN screening history: last pap: was normal. The patient wears seatbelts: yes. The patient participates in regular exercise: no. Has the patient ever been transfused or tattooed?: not asked. The patient reports that there is not domestic violence in her life.  Pt doing well with HR patch.  Had a menses in August, 1 spot in October and January.  Pt advised to call office if she continues to have irregular bleeding.  Most likely going into menopause.  Menstrual History: OB History   Grav Para Term Preterm Abortions TAB SAB Ect Mult Living   1    1  1         Patient's last menstrual period was 12/04/2013.    The following portions of the patient's history were reviewed and updated as appropriate: allergies, current medications, past family history, past medical history, past social history, past surgical history and problem list.  Review of Systems Pertinent items are noted in HPI.    Objective:      Filed Vitals:   03/26/14 1515  BP: 113/73  Pulse: 89  Resp: 16  Height: 5\' 7"  (1.702 m)  Weight: 230 lb (104.327 kg)   Vitals:  WNL General appearance: alert, cooperative and no distress Head: Normocephalic, without obvious abnormality, atraumatic Eyes: negative Throat: lips, mucosa, and tongue normal; teeth and gums normal Lungs: clear to auscultation bilaterally Breasts: normal appearance, no masses or tenderness, No nipple retraction or dimpling, No nipple discharge or bleeding Heart: regular rate and rhythm Abdomen: soft, non-tender; bowel sounds normal; no masses,  no organomegaly  Pelvic:  External Genitalia:  Tanner V, no lesion Urethra:  No prolapse Vagina:  Pink, normal rugae, no blood or discharge Cervix:  No CMT, no lesion Uterus:  Normal size and contour, non tender, exam limited by habitus Adnexa:  Normal, no  masses, non tender, exam limited by habitus Rectovaginal Septum:  Non tender, no masses  Extremities: no edema, redness or tenderness in the calves or thighs Skin: no lesions or rash Lymph nodes: Axillary adenopathy: none     .    Assessment:    Healthy female exam.    Plan:     All questions answered. KOH prep.  Mammogram nml Pap done in 2014 with negative cotesting.  Pap due in 4 years. Weight loss encouraged.

## 2014-03-26 NOTE — Addendum Note (Signed)
Addended by: Gretchen Short on: 03/26/2014 04:16 PM   Modules accepted: Orders

## 2014-03-30 LAB — CERVICOVAGINAL ANCILLARY ONLY
WET PREP (BD AFFIRM): NEGATIVE
WET PREP (BD AFFIRM): NEGATIVE
Wet Prep (BD Affirm): NEGATIVE

## 2014-03-30 LAB — CULTURE, URINE COMPREHENSIVE: Colony Count: 40000

## 2014-04-07 ENCOUNTER — Ambulatory Visit (INDEPENDENT_AMBULATORY_CARE_PROVIDER_SITE_OTHER): Payer: BC Managed Care – PPO | Admitting: Family Medicine

## 2014-04-07 ENCOUNTER — Other Ambulatory Visit: Payer: BC Managed Care – PPO

## 2014-04-07 ENCOUNTER — Encounter: Payer: Self-pay | Admitting: Family Medicine

## 2014-04-07 ENCOUNTER — Encounter: Payer: Self-pay | Admitting: *Deleted

## 2014-04-07 VITALS — BP 118/74 | HR 96 | Wt 231.0 lb

## 2014-04-07 DIAGNOSIS — R748 Abnormal levels of other serum enzymes: Secondary | ICD-10-CM

## 2014-04-07 DIAGNOSIS — R0789 Other chest pain: Secondary | ICD-10-CM

## 2014-04-07 DIAGNOSIS — Z0000 Encounter for general adult medical examination without abnormal findings: Secondary | ICD-10-CM

## 2014-04-07 LAB — COMPLETE METABOLIC PANEL WITH GFR
ALK PHOS: 61 U/L (ref 39–117)
ALT: 20 U/L (ref 0–35)
AST: 24 U/L (ref 0–37)
Albumin: 3.9 g/dL (ref 3.5–5.2)
BUN: 9 mg/dL (ref 6–23)
CO2: 26 mEq/L (ref 19–32)
CREATININE: 0.69 mg/dL (ref 0.50–1.10)
Calcium: 9 mg/dL (ref 8.4–10.5)
Chloride: 104 mEq/L (ref 96–112)
GFR, Est African American: >89 mL/min
GFR, Est Non African American: >89 mL/min
Glucose, Bld: 103 mg/dL — ABNORMAL HIGH (ref 70–99)
Potassium: 4 mEq/L (ref 3.5–5.3)
Sodium: 138 mEq/L (ref 135–145)
Total Bilirubin: 0.7 mg/dL (ref 0.2–1.2)
Total Protein: 6.7 g/dL (ref 6.0–8.3)

## 2014-04-07 NOTE — Patient Instructions (Signed)
Keep up a regular exercise program and make sure you are eating a healthy diet Try to eat 4 servings of dairy a day, or if you are lactose intolerant take a calcium with vitamin D daily.  Your vaccines are up to date.   

## 2014-04-07 NOTE — Progress Notes (Signed)
  Subjective:     Nicole Mata is a 53 y.o. female and is here for a comprehensive physical exam. The patient reports problems - went to [facility] for sharp chest pain.  Went to ED and had normal check up. no signs of MI. Told likely reflux. Marland Kitchen  History   Social History  . Marital Status: Divorced    Spouse Name: N/A    Number of Children: 0  . Years of Education: N/A   Occupational History  . Customer Service.     Social History Main Topics  . Smoking status: Never Smoker   . Smokeless tobacco: Never Used  . Alcohol Use: No  . Drug Use: No  . Sexual Activity: Yes    Partners: Male     Comment: divorced, no kids, customer service, 2 caffeine drinks daily.   Other Topics Concern  . Not on file   Social History Narrative   1 can of soda a day.  No regular exercise.    Health Maintenance  Topic Date Due  . Influenza Vaccine  07/04/2014  . Mammogram  01/27/2016  . Pap Smear  02/06/2016  . Colonoscopy  09/25/2017  . Tetanus/tdap  12/04/2018    The following portions of the patient's history were reviewed and updated as appropriate: allergies, current medications, past family history, past medical history, past social history, past surgical history and problem list.  Review of Systems A comprehensive review of systems was negative.   Objective:    BP 118/74  Pulse 96  Wt 231 lb (104.781 kg)  LMP 12/04/2013 General appearance: alert, cooperative and appears stated age Head: Normocephalic, without obvious abnormality, atraumatic Eyes: conj clear, EOMi, PEERLA Ears: normal TM's and external ear canals both ears Nose: Nares normal. Septum midline. Mucosa normal. No drainage or sinus tenderness. Throat: lips, mucosa, and tongue normal; teeth and gums normal Neck: no adenopathy, no carotid bruit, no JVD, supple, symmetrical, trachea midline and thyroid not enlarged, symmetric, no tenderness/mass/nodules Back: symmetric, no curvature. ROM normal. No CVA  tenderness. Lungs: clear to auscultation bilaterally Heart: regular rate and rhythm, S1, S2 normal, no murmur, click, rub or gallop Abdomen: soft, non-tender; bowel sounds normal; no masses,  no organomegaly Extremities: extremities normal, atraumatic, no cyanosis or edema Pulses: 2+ and symmetric Skin: Skin color, texture, turgor normal. No rashes or lesions Lymph nodes: Cervical, supraclavicular, and axillary nodes normal. Neurologic: Alert and oriented X 3, normal strength and tone. Normal symmetric reflexes. Normal coordination and gait    Assessment:    Healthy female exam.      Plan:     See After Visit Summary for Counseling Recommendations  Keep up a regular exercise program and make sure you are eating a healthy diet Try to eat 4 servings of dairy a day, or if you are lactose intolerant take a calcium with vitamin D daily.  Your vaccines are up to date.   At ypical chest pain - will get records from [facility] emergency department, [facility]. I would like to set her up for a treadmill stress test. She did experience some left-sided facial numbness with the chest pain a couple nights ago. She does have a history of diabetes. In family history of heart disease in her father who was around age 44 when he was first diagnosed. She does not have a history of high blood pressure or hyperlipidemia.Marland Kitchen

## 2014-04-08 ENCOUNTER — Other Ambulatory Visit: Payer: Self-pay | Admitting: *Deleted

## 2014-04-08 LAB — FOLLICLE STIMULATING HORMONE: FSH: 26.9 m[IU]/mL

## 2014-04-08 MED ORDER — GLUCOSE BLOOD VI STRP: ORAL_STRIP | Status: DC

## 2014-04-08 MED ORDER — ONETOUCH DELICA LANCETS 33G MISC: Status: DC

## 2014-04-08 NOTE — Progress Notes (Signed)
Quick Note:  All labs are normal. ______ 

## 2014-04-09 ENCOUNTER — Other Ambulatory Visit: Payer: Self-pay | Admitting: Family Medicine

## 2014-04-28 ENCOUNTER — Telehealth: Payer: Self-pay | Admitting: *Deleted

## 2014-04-28 NOTE — Telephone Encounter (Signed)
Called and gave pt address for stress testing [address] [place]. Ph# [phone].Nicole Mata

## 2014-05-06 ENCOUNTER — Telehealth (HOSPITAL_COMMUNITY): Payer: Self-pay

## 2014-05-12 ENCOUNTER — Ambulatory Visit (HOSPITAL_COMMUNITY)
Admission: RE | Admit: 2014-05-12 | Discharge: 2014-05-12 | Disposition: A | Discharge status: Home / Self Care | Payer: BC Managed Care – PPO | Source: Ambulatory Visit | Attending: Cardiovascular Disease | Admitting: Cardiovascular Disease

## 2014-05-12 DIAGNOSIS — R0789 Other chest pain: Secondary | ICD-10-CM

## 2014-05-12 DIAGNOSIS — Z0000 Encounter for general adult medical examination without abnormal findings: Secondary | ICD-10-CM

## 2014-05-13 ENCOUNTER — Telehealth: Payer: Self-pay | Admitting: Family Medicine

## 2014-05-13 NOTE — Telephone Encounter (Signed)
Please call patient and know that the stress test was completely normal. This is very reassuring that her chest pain is not likely related to ischemic heart disease.

## 2014-05-13 NOTE — Telephone Encounter (Signed)
Patient advised.

## 2014-06-18 NOTE — Telephone Encounter (Signed)
Encounter complete. 

## 2014-07-08 ENCOUNTER — Other Ambulatory Visit: Payer: Self-pay | Admitting: Obstetrics & Gynecology

## 2014-09-07 ENCOUNTER — Emergency Department
Admission: EM | Admit: 2014-09-07 | Discharge: 2014-09-07 | Disposition: A | Discharge status: Home / Self Care | Payer: BC Managed Care – PPO | Source: Home / Self Care

## 2014-09-07 ENCOUNTER — Encounter: Payer: Self-pay | Admitting: Emergency Medicine

## 2014-09-07 DIAGNOSIS — M5010 Cervical disc disorder with radiculopathy, unspecified cervical region: Secondary | ICD-10-CM

## 2014-09-07 MED ORDER — METHYLPREDNISOLONE SODIUM SUCC 125 MG IJ SOLR
125.0000 mg | Freq: Once | INTRAMUSCULAR | Status: AC
  Administered 2014-09-07: 125 mg via INTRAMUSCULAR

## 2014-09-07 NOTE — ED Provider Notes (Signed)
CSN: [id number]     Arrival date & time 09/07/14  1914 History   None    No chief complaint on file.  (Consider location/radiation/quality/duration/timing/severity/associated sxs/prior Treatment) Patient is a 53 y.o. female presenting with arm injury. The history is provided by the patient. No language interpreter was used.  Arm Injury Location:  Arm Injury: no   Arm location:  L arm Pain details:    Quality:  Aching   Radiates to:  Does not radiate   Severity:  Moderate   Onset quality:  Gradual   Timing:  Constant   Progression:  Worsening Chronicity:  New Foreign body present:  No foreign bodies Relieved by:  Nothing Worsened by:  Nothing tried Ineffective treatments:  None tried Associated symptoms: neck pain     Past Medical History  Diagnosis Date  . Hiatal hernia   . IBS (irritable bowel syndrome)   . GERD (gastroesophageal reflux disease)   . Helicobacter pylori gastritis   . Gastropathy     reactive  . Diverticulosis   . Ileitis   . PONV (postoperative nausea and vomiting)   . Diabetes mellitus without complication   . Ankle fracture, right 04/12/13   Past Surgical History  Procedure Laterality Date  . Shoulder surgery  2002, 2003    bilateral for bone spurs and torn RC  . Cholecystectomy  2001  . Laparoscopy  11/19/2012    Procedure: LAPAROSCOPY DIAGNOSTIC;  Surgeon: Joyice Faster. Cornett, MD;  Location: [facility];  Service: General;  Laterality: N/A;  Diagnostic laparoscopy  . Umbilical hernia repair  11/19/2012    Procedure: LAPAROSCOPIC UMBILICAL HERNIA;  Surgeon: Joyice Faster. Cornett, MD;  Location: [facility];  Service: General;  Laterality: N/A;  ventral hernia repair with mesh   Family History  Problem Relation Age of Onset  . Heart disease      family disease  . Heart attack Father 40    grandparents  . Hypertension Father   . Hyperlipidemia Father   . Diabetes Maternal Grandmother   . Diabetes Maternal Uncle   . Ovarian cancer Maternal Aunt     x 2  .  Hypertension Mother   . Hyperlipidemia Mother   . Diabetes Brother   . Colon cancer Neg Hx   . Ovarian cancer Maternal Aunt   . Diabetes type II Mother    History  Substance Use Topics  . Smoking status: Never Smoker   . Smokeless tobacco: Never Used  . Alcohol Use: No   OB History   Grav Para Term Preterm Abortions TAB SAB Ect Mult Living   1    1  1         Review of Systems  Musculoskeletal: Positive for neck pain.  All other systems reviewed and are negative. Pt has soreness in her neck and left shoulder.   Pt feels like it is her neck.  Pt has a bulging disc.  Pt reports Father is a Audiological scientist and told pt to get an ekg  Allergies  Penicillins; Sulfa drugs cross reactors; Contrast media; Morphine and related; Azithromycin; and Hydrocodone  Home Medications   Prior to Admission medications   Medication Sig Start Date End Date Taking? Authorizing Provider  AMBULATORY NON FORMULARY MEDICATION Medication Name: Glucometer, lancets, strips. Test once a day. Diagnosis 250.00 11/21/13   Hali Marry, MD  AMBULATORY NON FORMULARY MEDICATION Test strips and Lancets for one touch ultra II  Testing once a day.   DX. 250.00 01/14/14  Jade L Breeback, PA-C  Cinnamon 500 MG capsule Take 500 mg by mouth daily.    Historical Provider, MD  COMBIPATCH 0.05-0.14 MG/DAY PLACE 1 PATCH ONTO SKIN TWICE PER WEEK 07/08/14   Guss Bunde, MD  cyclobenzaprine (FLEXERIL) 10 MG tablet Take 1 tablet (10 mg total) by mouth at bedtime as needed for muscle spasms. 11/21/13 11/21/14  Hali Marry, MD  Dexlansoprazole 30 MG capsule Take 30 mg by mouth.    Historical Provider, MD  esomeprazole (NEXIUM) 20 MG capsule Take 20 mg by mouth.    Historical Provider, MD  fluticasone (FLONASE) 50 MCG/ACT nasal spray Place 2 sprays into the nose daily. 09/10/13   Jade L Breeback, PA-C  glucose blood (ONE TOUCH ULTRA TEST) test strip Test twice daily. DX 250.02 04/08/14   Hali Marry, MD   ketotifen (ZADITOR) 0.025 % ophthalmic solution Place 1 drop into both eyes every morning.    Historical Provider, MD  metFORMIN (GLUCOPHAGE) 500 MG tablet Take 1 tablet (500 mg total) by mouth 2 (two) times daily with a meal. 01/14/14   Jade L Breeback, PA-C  ONETOUCH DELICA LANCETS FINE MISC TEST ONCE DAILY 04/09/14   Hali Marry, MD   There were no vitals taken for this visit. Physical Exam  Nursing note and vitals reviewed. Constitutional: She is oriented to person, place, and time. She appears well-developed and well-nourished.  HENT:  Head: Normocephalic.  Right Ear: External ear normal.  Mouth/Throat: Oropharynx is clear and moist.  Eyes: EOM are normal.  Neck: Normal range of motion.  Cardiovascular: Normal rate, regular rhythm and normal heart sounds.   Pulmonary/Chest: Effort normal and breath sounds normal.  Abdominal: Soft. Bowel sounds are normal. She exhibits no distension.  Musculoskeletal: Normal range of motion.  Trapezius tightness  Neurological: She is alert and oriented to person, place, and time.  Skin: There is erythema.  Psychiatric: She has a normal mood and affect.    ED Course  Procedures (including critical care time) Labs Review Labs Reviewed - No data to display  Imaging Review No results found. EKG  Normal sinus 89  Normal ekg  MDM  Pain is very radicular.   Doubt cardiac, reassured by normal EKG.   Pt given solumedrol.   Pt will continue mobic.   1. Cervical disc disorder with radiculopathy of cervical region    Solumedrol Im Mobic as previously prescribed See Dr. Madilyn Fireman for recheck in 3-4 days AVS    Fransico Meadow, PA-C 09/07/14 1951

## 2014-09-07 NOTE — ED Notes (Signed)
Reports chronic C 3-4 pain; today noticed tingling in left neck and arm. No dyspnea or other signs. Received rx for Mobic from [facility] today, which she has not taken yet.

## 2014-09-07 NOTE — Discharge Instructions (Signed)

## 2014-09-08 ENCOUNTER — Telehealth: Payer: Self-pay

## 2014-09-08 NOTE — Telephone Encounter (Signed)
Destaney was seen at Urgent Care last night for neck pain. She was treated with Solumedrol. She later felt bad and went to the [facility] ED [facility] and was given insulin to bring down her blood sugar because it was over 300. This morning her blood sugar was 251. She wants to know if she can still take her metformin today. I advised her she could take her metformin but I would confirm with Dr Madilyn Fireman.

## 2014-09-09 NOTE — Telephone Encounter (Signed)
Yes, I agree she should take the metformin this morning. In fact I would encourage her to take 2 tabs of metformin this morning and then one this evening on her usual routine.

## 2014-09-09 NOTE — Telephone Encounter (Signed)
Patient advised.

## 2014-09-10 NOTE — ED Provider Notes (Signed)
Medical history/examination/treatment/procedure(s) were performed by non-physician provider and as supervising physician I was immediately available for consultation/collaboration.  Jacqulyn Cane, MD 09/10/14 0930

## 2014-09-11 ENCOUNTER — Other Ambulatory Visit: Payer: Self-pay | Admitting: Obstetrics & Gynecology

## 2014-09-15 ENCOUNTER — Encounter: Payer: Self-pay | Admitting: Family Medicine

## 2014-09-15 ENCOUNTER — Ambulatory Visit (INDEPENDENT_AMBULATORY_CARE_PROVIDER_SITE_OTHER): Payer: BC Managed Care – PPO | Admitting: Family Medicine

## 2014-09-15 VITALS — BP 112/74 | HR 84 | Ht 67.0 in | Wt 227.0 lb

## 2014-09-15 DIAGNOSIS — R739 Hyperglycemia, unspecified: Secondary | ICD-10-CM

## 2014-09-15 DIAGNOSIS — Z23 Encounter for immunization: Secondary | ICD-10-CM

## 2014-09-15 DIAGNOSIS — M5412 Radiculopathy, cervical region: Secondary | ICD-10-CM | POA: Diagnosis not present

## 2014-09-15 DIAGNOSIS — IMO0002 Reserved for concepts with insufficient information to code with codable children: Secondary | ICD-10-CM

## 2014-09-15 DIAGNOSIS — E1165 Type 2 diabetes mellitus with hyperglycemia: Secondary | ICD-10-CM

## 2014-09-15 LAB — POCT UA - MICROALBUMIN
Creatinine, POC: 300 mg/dL
MICROALBUMIN (UR) POC: 80 mg/L

## 2014-09-15 LAB — POCT GLYCOSYLATED HEMOGLOBIN (HGB A1C): HEMOGLOBIN A1C: 7.4

## 2014-09-15 MED ORDER — METFORMIN HCL 1000 MG PO TABS: 1000.0000 mg | ORAL_TABLET | Freq: Two times a day (BID) | ORAL | Status: DC

## 2014-09-15 NOTE — Progress Notes (Signed)
   Subjective:    Patient ID: Nicole Mata, female    DOB: Nov 25, 1961, 53 y.o.   MRN: [id number]  HPI  53 year old female who was seen at urgent care on October 5 for right arm injury. She was diagnosed with cervical radiculopathy. She was given IM Solu-Medrol shot for acute relief. Unfortunately it significantly elevated her blood sugar and she ended up going to the emergency department. He is a nondiabetic. She was seen at the ED at [facility] and [place]. Her sugar was over 300. Sugars now running in the 130s.    Diabetes-I have not seen her and is 10 months. Last followup was in February. He has been taking her metformin 500 mg twice a day. No side effects or problems. No hypoglycemic events. No wounds or sores that are not healing well. Review of Systems     Objective:   Physical Exam  Constitutional: She is oriented to person, place, and time. She appears well-developed and well-nourished.  HENT:  Head: Normocephalic and atraumatic.  Cardiovascular: Normal rate, regular rhythm and normal heart sounds.   Pulmonary/Chest: Effort normal and breath sounds normal.  Neurological: She is alert and oriented to person, place, and time.  Skin: Skin is warm and dry.  Psychiatric: She has a normal mood and affect. Her behavior is normal.          Assessment & Plan:  Hyperglycemia secondary to IM steroid injection. Doing well . Back on her normal regimen and sugars almost back to normal.  Discussed that if she needs any future steroid injections we can always cover her for a short period time with insulin or sometimes increasing the metformin can help.  Cervical radiculopathy is better and scheduling a massage next week.  She does feel like the injection was helpful. He says this is a recurrent problem and she has to do with it when it flares.  Diabetes - Uncontrolled. Will increase metformin 1000mg  BID.  Foot exam is done today.  Stressed the importance of following up in 3 months. Urine  microalbumin performed today. We'll discuss pneumonia vaccine at followup visit to

## 2014-10-05 ENCOUNTER — Encounter: Payer: Self-pay | Admitting: Family Medicine

## 2014-11-02 ENCOUNTER — Other Ambulatory Visit: Payer: Self-pay | Admitting: Physician Assistant

## 2014-12-15 ENCOUNTER — Ambulatory Visit (INDEPENDENT_AMBULATORY_CARE_PROVIDER_SITE_OTHER): Payer: BLUE CROSS/BLUE SHIELD | Admitting: Family Medicine

## 2014-12-15 ENCOUNTER — Ambulatory Visit (INDEPENDENT_AMBULATORY_CARE_PROVIDER_SITE_OTHER): Payer: BLUE CROSS/BLUE SHIELD

## 2014-12-15 ENCOUNTER — Ambulatory Visit (INDEPENDENT_AMBULATORY_CARE_PROVIDER_SITE_OTHER): Payer: BLUE CROSS/BLUE SHIELD | Admitting: Sports Medicine

## 2014-12-15 ENCOUNTER — Encounter: Payer: Self-pay | Admitting: Sports Medicine

## 2014-12-15 ENCOUNTER — Encounter: Payer: Self-pay | Admitting: Family Medicine

## 2014-12-15 VITALS — BP 125/74 | HR 84 | Ht 64.0 in | Wt 227.0 lb

## 2014-12-15 DIAGNOSIS — Z87442 Personal history of urinary calculi: Secondary | ICD-10-CM

## 2014-12-15 DIAGNOSIS — M2240 Chondromalacia patellae, unspecified knee: Secondary | ICD-10-CM | POA: Insufficient documentation

## 2014-12-15 DIAGNOSIS — M7711 Lateral epicondylitis, right elbow: Secondary | ICD-10-CM | POA: Diagnosis not present

## 2014-12-15 DIAGNOSIS — E1165 Type 2 diabetes mellitus with hyperglycemia: Secondary | ICD-10-CM

## 2014-12-15 DIAGNOSIS — Z23 Encounter for immunization: Secondary | ICD-10-CM | POA: Diagnosis not present

## 2014-12-15 DIAGNOSIS — M170 Bilateral primary osteoarthritis of knee: Secondary | ICD-10-CM

## 2014-12-15 DIAGNOSIS — M2241 Chondromalacia patellae, right knee: Secondary | ICD-10-CM

## 2014-12-15 DIAGNOSIS — IMO0002 Reserved for concepts with insufficient information to code with codable children: Secondary | ICD-10-CM

## 2014-12-15 DIAGNOSIS — M25521 Pain in right elbow: Secondary | ICD-10-CM

## 2014-12-15 LAB — POCT GLYCOSYLATED HEMOGLOBIN (HGB A1C): Hemoglobin A1C: 8.1

## 2014-12-15 MED ORDER — MELOXICAM 15 MG PO TABS: 15.0000 mg | ORAL_TABLET | Freq: Every day | ORAL | Status: DC

## 2014-12-15 MED ORDER — SITAGLIP PHOS-METFORMIN HCL ER 100-1000 MG PO TB24: 1.0000 | ORAL_TABLET | Freq: Every day | ORAL | Status: DC

## 2014-12-15 MED ORDER — AMBULATORY NON FORMULARY MEDICATION: Status: DC

## 2014-12-15 NOTE — Assessment & Plan Note (Signed)
Formal PT, meloxicam, x-rays.  Return in one month, injection if no better, there is likely also a lateral meniscal injury concurrently.

## 2014-12-15 NOTE — Progress Notes (Signed)
   Subjective:    I'm seeing this patient as a consultation for:  Dr. Madilyn Fireman  CC: right knee pain, right elbow pain  HPI: Right knee pain: Present for months, localized anteriorly and worse with squatting and kneeling. Also worse when going up and down the stairs, with some stiffness after an activity. Moderate, persistent, no mechanical symptoms.  Right lateral epicondylitis: Pain over the lateral upper condyle, worse with gripping, no trauma. Symptoms are moderate, persistent. No further radicular symptoms.  Past medical history, Surgical history, Family history not pertinant except as noted below, Social history, Allergies, and medications have been entered into the medical record, reviewed, and no changes needed.   Review of Systems: No headache, visual changes, nausea, vomiting, diarrhea, constipation, dizziness, abdominal pain, skin rash, fevers, chills, night sweats, weight loss, swollen lymph nodes, body aches, joint swelling, muscle aches, chest pain, shortness of breath, mood changes, visual or auditory hallucinations.   Objective:   General: Well Developed, well nourished, and in no acute distress.  Neuro/Psych: Alert and oriented x3, extra-ocular muscles intact, able to move all 4 extremities, sensation grossly intact. Skin: Warm and dry, no rashes noted.  Respiratory: Not using accessory muscles, speaking in full sentences, trachea midline.  Cardiovascular: Pulses palpable, no extremity edema. Abdomen: Does not appear distended. Right Knee: Normal to inspection with no erythema or effusion or obvious bony abnormalities. Palpation normal with no warmth or joint line tenderness or patellar tenderness or condyle tenderness. ROM normal in flexion and extension and lower leg rotation. Ligaments with solid consistent endpoints including ACL, PCL, LCL, MCL. Negative Mcmurray's and provocative meniscal tests. Non painful patellar compression. Patellar and quadriceps tendons  unremarkable. Hamstring and quadriceps strength is normal. Right Elbow: Unremarkable to inspection. Range of motion full pronation, supination, flexion, extension. Strength is full to all of the above directions Stable to varus, valgus stress. Negative moving valgus stress test. Tender to palpation of the common extensor tendon origin with reproduction of pain with resisted extension of the middle finger. Ulnar nerve does not sublux. Negative cubital tunnel Tinel's.  Impression and Recommendations:   This case required medical decision making of moderate complexity.

## 2014-12-15 NOTE — Assessment & Plan Note (Signed)
Formal PT, meloxicam, x-rays. Return in one month, injection if no better.

## 2014-12-15 NOTE — Progress Notes (Signed)
   Subjective:    Patient ID: Nicole Mata, female    DOB: 11-10-1961, 54 y.o.   MRN: [id number]  HPI Diabetes - no hypoglycemic events. No wounds or sores that are not healing well. No increased thirst or urination. Checking glucose at home. Taking medications as prescribed without any side effects. She got married over the holidays since that she was quite busy but really wasn't eating the best. She know she needs to work on this.  He did pass a kidney stone since I last saw her.  Review of Systems     Objective:   Physical Exam  Constitutional: She is oriented to person, place, and time. She appears well-developed and well-nourished.  HENT:  Head: Normocephalic and atraumatic.  Cardiovascular: Normal rate, regular rhythm and normal heart sounds.   Pulmonary/Chest: Effort normal and breath sounds normal.  Neurological: She is alert and oriented to person, place, and time.  Skin: Skin is warm and dry.  Psychiatric: She has a normal mood and affect. Her behavior is normal.          Assessment & Plan:  DM- Uncontrolled.  Will add [place] to her regimen. Discussed Pnuem[place]al 23. Given today.    F/u in 3 month. Work on getting back on track with diet and exercise.

## 2015-01-08 ENCOUNTER — Ambulatory Visit: Payer: BLUE CROSS/BLUE SHIELD | Admitting: Sports Medicine

## 2015-01-12 ENCOUNTER — Ambulatory Visit: Payer: BLUE CROSS/BLUE SHIELD | Admitting: Sports Medicine

## 2015-01-14 ENCOUNTER — Telehealth: Payer: Self-pay | Admitting: *Deleted

## 2015-01-14 MED ORDER — SAXAGLIPTIN HCL 2.5 MG PO TABS: 2.5000 mg | ORAL_TABLET | Freq: Every day | ORAL | Status: DC

## 2015-01-14 MED ORDER — METFORMIN HCL 1000 MG PO TABS: 1000.0000 mg | ORAL_TABLET | Freq: Two times a day (BID) | ORAL | Status: DC

## 2015-01-14 NOTE — Telephone Encounter (Signed)
Pt called and lvm stating that the New DM med has caused her not to feel well she has not been able to sleep, she has had hot flashes, and flushed which went away some. Then she started feeling dizzy, nauseated stomach pain. She stopped that med for the past few days and has a sinus inf. She has been seen in the ed for this and was given a z pack. She said that she checked the side effects of the med and noticed that she was experiencing them (except the sinus inf) she wanted to know if she should go back on the metformin bec this medication is too strong. She said her BS this AM was 123. Please advise.Nicole Mata [place]

## 2015-01-14 NOTE — Telephone Encounter (Signed)
OK, stop the Janumet. Added to intolerance list. Will restart metformin and call in 2nd med.  Metformin by  Itself won't be enough to lower sugar, since that is what she was aleady on. We may have coupone card for the [facility].

## 2015-01-15 NOTE — Telephone Encounter (Signed)
Pt informed she said that the pharmacy told her that they would have to do a PA. I told her that I have samples that I can leave for her up front for her that will get her thru for 3 wks.

## 2015-02-06 LAB — LIPID PANEL
Cholesterol: 157 mg/dL (ref 0–200)
HDL: 40 mg/dL (ref 35–70)
LDL CALC: 97 mg/dL
LDL/HDL RATIO: 3.9
Triglycerides: 98 mg/dL (ref 40–160)

## 2015-02-06 LAB — HEPATIC FUNCTION PANEL
ALT: 19 U/L (ref 7–35)
AST: 18 U/L (ref 13–35)
Alkaline Phosphatase: 82 U/L (ref 25–125)
Bilirubin, Direct: 0.14 mg/dL (ref 0.01–0.4)
Bilirubin, Total: 0.5 mg/dL

## 2015-02-06 LAB — CBC AND DIFFERENTIAL
HCT: 38 % (ref 36–46)
HEMOGLOBIN: 12.6 g/dL (ref 12.0–16.0)
PLATELETS: 355 10*3/uL (ref 150–399)
WBC: 8.3 10*3/mL

## 2015-02-06 LAB — BASIC METABOLIC PANEL
BUN: 11 mg/dL (ref 4–21)
CREATININE: 0.6 mg/dL (ref 0.5–1.1)
POTASSIUM: 4.8 mmol/L (ref 3.4–5.3)
Sodium: 139 mmol/L (ref 137–147)

## 2015-02-08 ENCOUNTER — Other Ambulatory Visit (HOSPITAL_BASED_OUTPATIENT_CLINIC_OR_DEPARTMENT_OTHER): Payer: Self-pay | Admitting: Obstetrics & Gynecology

## 2015-02-08 DIAGNOSIS — Z1231 Encounter for screening mammogram for malignant neoplasm of breast: Secondary | ICD-10-CM

## 2015-02-16 ENCOUNTER — Ambulatory Visit (HOSPITAL_BASED_OUTPATIENT_CLINIC_OR_DEPARTMENT_OTHER)
Admission: RE | Admit: 2015-02-16 | Discharge: 2015-02-16 | Disposition: A | Discharge status: Home / Self Care | Payer: BLUE CROSS/BLUE SHIELD | Source: Ambulatory Visit | Attending: Obstetrics & Gynecology | Admitting: Obstetrics & Gynecology

## 2015-02-16 DIAGNOSIS — Z1231 Encounter for screening mammogram for malignant neoplasm of breast: Secondary | ICD-10-CM | POA: Diagnosis present

## 2015-03-18 ENCOUNTER — Encounter: Payer: Self-pay | Admitting: Family Medicine

## 2015-03-18 ENCOUNTER — Ambulatory Visit (INDEPENDENT_AMBULATORY_CARE_PROVIDER_SITE_OTHER): Payer: BLUE CROSS/BLUE SHIELD | Admitting: Family Medicine

## 2015-03-18 VITALS — BP 118/82 | HR 108 | Ht 64.0 in | Wt 213.0 lb

## 2015-03-18 DIAGNOSIS — E1165 Type 2 diabetes mellitus with hyperglycemia: Secondary | ICD-10-CM

## 2015-03-18 DIAGNOSIS — J019 Acute sinusitis, unspecified: Secondary | ICD-10-CM | POA: Diagnosis not present

## 2015-03-18 DIAGNOSIS — IMO0002 Reserved for concepts with insufficient information to code with codable children: Secondary | ICD-10-CM

## 2015-03-18 LAB — HM DIABETES EYE EXAM

## 2015-03-18 LAB — POCT GLYCOSYLATED HEMOGLOBIN (HGB A1C): Hemoglobin A1C: 7.3

## 2015-03-18 MED ORDER — SAXAGLIPTIN HCL 2.5 MG PO TABS: 2.5000 mg | ORAL_TABLET | Freq: Every day | ORAL | Status: DC

## 2015-03-18 MED ORDER — LEVOFLOXACIN 500 MG PO TABS: 500.0000 mg | ORAL_TABLET | Freq: Every day | ORAL | Status: DC

## 2015-03-18 NOTE — Progress Notes (Signed)
   Subjective:    Patient ID: Nicole Mata, female    DOB: 07-24-1961, 54 y.o.   MRN: [id number]  HPI  Diabetes - no hypoglycemic events. No wounds or sores that are not healing well. No increased thirst or urination. Checking glucose at home. Taking medications as prescribed without any side effects. Getting eye exam done today. Has lost 14 lbs.  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  Has been walking for exercise.  She was hoping to cut back on her medications today.  Sick for a week with sinus congestion. Has been using OTC meds. Went to UC on Sunday ( 4 days ago) and was told it was allergic.  Has been taking the zyrtec D. Says nasal drainage is clear but sputum form cough is green. Says her top teeth have been hurting for 2 days. Pain under the eyes bilateally.   Review of Systems     Objective:   Physical Exam  Constitutional: She is oriented to person, place, and time. She appears well-developed and well-nourished.  HENT:  Head: Normocephalic and atraumatic.  Right Ear: External ear normal.  Left Ear: External ear normal.  Nose: Nose normal.  Mouth/Throat: Oropharynx is clear and moist.  TMs and canals are clear.   Eyes: Conjunctivae and EOM are normal. Pupils are equal, round, and reactive to light.  Neck: Neck supple. No thyromegaly present.  Cardiovascular: Normal rate, regular rhythm and normal heart sounds.   Pulmonary/Chest: Effort normal and breath sounds normal. She has no wheezes.  Lymphadenopathy:    She has no cervical adenopathy.  Neurological: She is alert and oriented to person, place, and time.  Skin: Skin is warm and dry.  Psychiatric: She has a normal mood and affect.          Assessment & Plan:  DM- much improved. Her A1C is down to 7.3 from 8.1.  This is absolutely fantastic with diet and lifestyle changes as well as significant weight loss. She says she really wants to come off of her diabetic medications. I certainly think this is reasonable but not until we get  her A1c completely to goal. Will continue current regimen. I offered to adjust the medication since her A1c is still over 7 but she prefers to continue to work on weight loss and diet and lifestyle which I think is reasonable considering the impact she has made in the last 3 months. Follow-up in 3 months. Eye exam is scheduled for today.  Acute sinusitis - will tx with levaquin, chosen in regards to her medication allergies. Call if no tbetter in one week.

## 2015-03-19 ENCOUNTER — Encounter: Payer: Self-pay | Admitting: Family Medicine

## 2015-03-19 LAB — COMPLETE METABOLIC PANEL WITH GFR
ALT: 44 U/L — ABNORMAL HIGH (ref 0–35)
AST: 41 U/L — ABNORMAL HIGH (ref 0–37)
Albumin: 4.2 g/dL (ref 3.5–5.2)
Alkaline Phosphatase: 158 U/L — ABNORMAL HIGH (ref 39–117)
BUN: 9 mg/dL (ref 6–23)
CALCIUM: 9.3 mg/dL (ref 8.4–10.5)
CO2: 30 meq/L (ref 19–32)
CREATININE: 0.61 mg/dL (ref 0.50–1.10)
Chloride: 99 mEq/L (ref 96–112)
GFR, Est African American: >89 mL/min
GFR, Est Non African American: >89 mL/min
Glucose, Bld: 135 mg/dL — ABNORMAL HIGH (ref 70–99)
Potassium: 4.1 mEq/L (ref 3.5–5.3)
Sodium: 137 mEq/L (ref 135–145)
Total Bilirubin: 1 mg/dL (ref 0.2–1.2)
Total Protein: 7.8 g/dL (ref 6.0–8.3)

## 2015-03-19 LAB — LIPID PANEL
CHOL/HDL RATIO: 4.6 ratio
Cholesterol: 169 mg/dL (ref 0–200)
HDL: 37 mg/dL — AB (ref >=46)
LDL Cholesterol: 115 mg/dL — ABNORMAL HIGH (ref 0–99)
Triglycerides: 83 mg/dL (ref <150)
VLDL: 17 mg/dL (ref 0–40)

## 2015-04-22 ENCOUNTER — Ambulatory Visit (INDEPENDENT_AMBULATORY_CARE_PROVIDER_SITE_OTHER): Payer: BLUE CROSS/BLUE SHIELD | Admitting: Family Medicine

## 2015-04-22 ENCOUNTER — Encounter: Payer: Self-pay | Admitting: Family Medicine

## 2015-04-22 VITALS — BP 118/75 | HR 80 | Ht 64.0 in | Wt 221.0 lb

## 2015-04-22 DIAGNOSIS — R0789 Other chest pain: Secondary | ICD-10-CM

## 2015-04-22 DIAGNOSIS — Z0000 Encounter for general adult medical examination without abnormal findings: Secondary | ICD-10-CM

## 2015-04-22 DIAGNOSIS — R072 Precordial pain: Secondary | ICD-10-CM

## 2015-04-22 DIAGNOSIS — Z6837 Body mass index (BMI) 37.0-37.9, adult: Secondary | ICD-10-CM

## 2015-04-22 MED ORDER — ONETOUCH DELICA LANCETS FINE MISC: Status: DC

## 2015-04-22 NOTE — Patient Instructions (Signed)
Keep up a regular exercise program and make sure you are eating a healthy diet Try to eat 4 servings of dairy a day, or if you are lactose intolerant take a calcium with vitamin D daily.  Your vaccines are up to date.   

## 2015-04-22 NOTE — Progress Notes (Signed)
Subjective:     Nicole Mata is a 54 y.o. female and is here for a comprehensive physical exam. The patient reports problems - lately at night having pain in the left hip and radiates down inbto the leg.  Only bothers her at night.  using icy hot and that helps.  . Mammogram is up-to-date. She sees GYN for her pelvic exam.  Has had some epigastric pain. Sometimes happens after she eats.  Says could be reflux.  Taking dexilant.  Some intermittant nausea.  Started zyrtec recently for allergies as well.  Left side of chest wall is sore. Hx of hiatal hernia.   She had lab work done through work.  History   Social History  . Marital Status: Divorced    Spouse Name: N/A  . Number of Children: 0  . Years of Education: N/A   Occupational History  . Customer Service.     Social History Main Topics  . Smoking status: Never Smoker   . Smokeless tobacco: Never Used  . Alcohol Use: No  . Drug Use: No  . Sexual Activity:    Partners: Male     Comment: divorced, no kids, customer service, 2 caffeine drinks daily.   Other Topics Concern  . Not on file   Social History Narrative   1 can of soda a day.  No regular exercise.    Health Maintenance  Topic Date Due  . PNEUMOCOCCAL POLYSACCHARIDE VACCINE (1) 07/02/1965  . HIV Screening  07/02/1978  . INFLUENZA VACCINE  07/05/2015  . FOOT EXAM  09/16/2015  . URINE MICROALBUMIN  09/16/2015  . HEMOGLOBIN A1C  09/17/2015  . PAP SMEAR  02/06/2016  . OPHTHALMOLOGY EXAM  03/17/2016  . MAMMOGRAM  02/15/2017  . COLONOSCOPY  09/25/2017  . TETANUS/TDAP  12/04/2018  . Hepatitis C Screening  Completed    The following portions of the patient's history were reviewed and updated as appropriate: allergies, current medications, past family history, past medical history, past social history, past surgical history and problem list.  Review of Systems A comprehensive review of systems was negative.   Objective:    BP 118/75 mmHg  Pulse 80  Ht 5'  4" (1.626 m)  Wt 221 lb (100.245 kg)  BMI 37.92 kg/m2 General appearance: alert, cooperative and appears stated age Head: Normocephalic, without obvious abnormality, atraumatic Eyes: conj clear, EOMI, PEERLA Ears: normal TM's and external ear canals both ears Nose: Nares normal. Septum midline. Mucosa normal. No drainage or sinus tenderness. Throat: lips, mucosa, and tongue normal; teeth and gums normal Neck: no adenopathy, no carotid bruit, no JVD, supple, symmetrical, trachea midline and thyroid not enlarged, symmetric, no tenderness/mass/nodules Back: symmetric, no curvature. ROM normal. No CVA tenderness. Lungs: clear to auscultation bilaterally Heart: regular rate and rhythm, S1, S2 normal, no murmur, click, rub or gallop Abdomen: soft, non-tender; bowel sounds normal; no masses,  no organomegaly Extremities: extremities normal, atraumatic, no cyanosis or edema Pulses: 2+ and symmetric Skin: Skin color, texture, turgor normal. No rashes or lesions Lymph nodes: Cervical, supraclavicular, and axillary nodes normal. Neurologic: Alert and oriented X 3, normal strength and tone. Normal symmetric reflexes. Normal coordination and gait    Assessment:    Healthy female exam.     Plan:     See After Visit Summary for Counseling Recommendations  Keep up a regular exercise program and make sure you are eating a healthy diet Try to eat 4 servings of dairy a day, or if you are  lactose intolerant take a calcium with vitamin D daily.  Your vaccines are up to date.   Atypical CP - most likely related to her hiatal hernia.  Will get EKG for reassurance.  EKG shows rate of 80 bpm, normal sinus rhythm, with normal axis, and no acute ST-T wave changes.. We discussed the importance of continuing reflux measures. Continuing reflux medication. Trying to stand if she starts to feel like she's getting discomfort from her hiatal hernia. Weight loss as she has recently gained some weight so this may be  contributing to her symptom increase recently.  Form completed for work.   Recent lab work shows normal completely about panel except glucose was mildly elevated at 118. She does have a diagnosis of diabetes. Cholesterol levels look great. Complete blood count is normal.  BMI 37-she just joined the gym 2 weeks ago and actually started wearing a fit bit. She really wants to work on diet and exercise and see if she can get her weight back down.

## 2015-04-27 ENCOUNTER — Encounter: Payer: Self-pay | Admitting: Family Medicine

## 2015-06-18 ENCOUNTER — Ambulatory Visit (INDEPENDENT_AMBULATORY_CARE_PROVIDER_SITE_OTHER): Payer: BLUE CROSS/BLUE SHIELD | Admitting: Family Medicine

## 2015-06-18 ENCOUNTER — Encounter: Payer: Self-pay | Admitting: Family Medicine

## 2015-06-18 VITALS — BP 120/77 | HR 83 | Ht 64.0 in | Wt 221.0 lb

## 2015-06-18 DIAGNOSIS — E1165 Type 2 diabetes mellitus with hyperglycemia: Secondary | ICD-10-CM

## 2015-06-18 DIAGNOSIS — IMO0002 Reserved for concepts with insufficient information to code with codable children: Secondary | ICD-10-CM

## 2015-06-18 DIAGNOSIS — E785 Hyperlipidemia, unspecified: Secondary | ICD-10-CM | POA: Insufficient documentation

## 2015-06-18 LAB — POCT GLYCOSYLATED HEMOGLOBIN (HGB A1C): Hemoglobin A1C: 7.3

## 2015-06-18 MED ORDER — ATORVASTATIN CALCIUM 20 MG PO TABS: 20.0000 mg | ORAL_TABLET | Freq: Every day | ORAL | Status: DC

## 2015-06-18 MED ORDER — SAXAGLIPTIN HCL 5 MG PO TABS: 5.0000 mg | ORAL_TABLET | Freq: Every day | ORAL | Status: DC

## 2015-06-18 MED ORDER — METFORMIN HCL 1000 MG PO TABS: 1000.0000 mg | ORAL_TABLET | Freq: Two times a day (BID) | ORAL | Status: DC

## 2015-06-18 NOTE — Progress Notes (Signed)
   Subjective:    Patient ID: Nicole Mata, female    DOB: [birth year], 54 y.o.   MRN: [id number]  HPI Diabetes - no hypoglycemic events. No wounds or sores that are not healing well. No increased thirst or urination. Checking glucose at home. Taking medications as prescribed without any side effects. She's currently on metformin and Onglyza area  Hyperlipidemia-we recently checked cluster levels in April and her levels to jump up.  Review of Systems     Objective:   Physical Exam  Constitutional: She is oriented to person, place, and time. She appears well-developed and well-nourished.  HENT:  Head: Normocephalic and atraumatic.  Cardiovascular: Normal rate, regular rhythm and normal heart sounds.   Pulmonary/Chest: Effort normal and breath sounds normal.  Neurological: She is alert and oriented to person, place, and time.  Skin: Skin is warm and dry.  Psychiatric: She has a normal mood and affect. Her behavior is normal.          Assessment & Plan:  DM- hemoglobin A1c is 7.3 today. Uncontrolled. But stable from last time. Discussed options. We will try increasing the Onglyza to 5 mg. New prescription sent to the pharmacy. Follow-up in 3 months. Call if any problems or concerns. She had failed Januvia because of side effects previously which is better covered on her insurance plan.   Hyperlipidemia- discussed based on current guidelines needs to be on a stain.

## 2015-06-23 ENCOUNTER — Telehealth: Payer: Self-pay | Admitting: Family Medicine

## 2015-06-23 NOTE — Telephone Encounter (Signed)
Received fax from pharmacy for prior authorization called [employer] [place] and they faxed form I filled form out and put in Dr. Gardiner Ramus box for a signature will fax once signed. - CF

## 2015-06-25 NOTE — Telephone Encounter (Signed)
Received authorization for Onglyzia. No specific auth #, but valid 06/25/15-no end date. Pharmacy has been notified and Rx was able to be processed. Will be auth fax to be scanned.

## 2015-08-13 ENCOUNTER — Other Ambulatory Visit: Payer: Self-pay | Admitting: Family Medicine

## 2015-08-20 ENCOUNTER — Ambulatory Visit (INDEPENDENT_AMBULATORY_CARE_PROVIDER_SITE_OTHER): Payer: BLUE CROSS/BLUE SHIELD | Admitting: Family Medicine

## 2015-08-20 ENCOUNTER — Encounter: Payer: Self-pay | Admitting: Family Medicine

## 2015-08-20 VITALS — BP 133/81 | HR 82 | Temp 98.4°F | Wt 223.0 lb

## 2015-08-20 DIAGNOSIS — N390 Urinary tract infection, site not specified: Secondary | ICD-10-CM

## 2015-08-20 DIAGNOSIS — Z23 Encounter for immunization: Secondary | ICD-10-CM

## 2015-08-20 DIAGNOSIS — J069 Acute upper respiratory infection, unspecified: Secondary | ICD-10-CM

## 2015-08-20 DIAGNOSIS — E1165 Type 2 diabetes mellitus with hyperglycemia: Secondary | ICD-10-CM | POA: Diagnosis not present

## 2015-08-20 DIAGNOSIS — J3489 Other specified disorders of nose and nasal sinuses: Secondary | ICD-10-CM | POA: Diagnosis not present

## 2015-08-20 DIAGNOSIS — IMO0002 Reserved for concepts with insufficient information to code with codable children: Secondary | ICD-10-CM

## 2015-08-20 LAB — POCT UA - MICROALBUMIN
CREATININE, POC: 300 mg/dL
Microalbumin Ur, POC: 80 mg/L

## 2015-08-20 LAB — POCT GLYCOSYLATED HEMOGLOBIN (HGB A1C): Hemoglobin A1C: 7.5

## 2015-08-20 LAB — POCT URINALYSIS DIPSTICK
Bilirubin, UA: NEGATIVE
GLUCOSE UA: NEGATIVE
Ketones, UA: NEGATIVE
Leukocytes, UA: NEGATIVE
Nitrite, UA: NEGATIVE
PH UA: 5
Protein, UA: NEGATIVE
Spec Grav, UA: >=1.03
UROBILINOGEN UA: 0.2

## 2015-08-20 MED ORDER — SITAGLIP PHOS-METFORMIN HCL ER 100-1000 MG PO TB24: 1.0000 | ORAL_TABLET | Freq: Every day | ORAL | Status: DC

## 2015-08-20 NOTE — Progress Notes (Signed)
   Subjective:    Patient ID: Nicole Mata, female    DOB: 1961-04-14, 54 y.o.   MRN: [id number]  HPI Seen at [facility] in [place] for a UTI. She felt like her body was on fire. Her sugar was up and her BP was up and was having some facial pressure.  They treated her and she just finished her Cipro yesterday.  She is feeling better.  Had a neg CT on her head.  Has had 4 days of sinus pressure and feelig some dizzy and nauseated.  At night chest feels tight, but no cough, or wheezing.  No fever.  Did start her zyrtec and using a hemeopathic nasal spray.  Felt like her tongu was swollen the last 2 nights and took some zyrtec.  Has had some post nasal drip.    Diabetes - no hypoglycemic events. No wounds or sores that are not healing well. No increased thirst or urination. Checking glucose at home. Taking medications as prescribed without any side effects.   Review of Systems     Objective:   Physical Exam  Constitutional: She is oriented to person, place, and time. She appears well-developed and well-nourished.  HENT:  Head: Normocephalic and atraumatic.  Right Ear: External ear normal.  Left Ear: External ear normal.  Nose: Nose normal.  Mouth/Throat: Oropharynx is clear and moist.  TMs and canals are clear.   Eyes: Conjunctivae and EOM are normal. Pupils are equal, round, and reactive to light.  Neck: Neck supple. No thyromegaly present.  Cardiovascular: Normal rate, regular rhythm and normal heart sounds.   Pulmonary/Chest: Effort normal and breath sounds normal. She has no wheezes.  Lymphadenopathy:    She has no cervical adenopathy.  Neurological: She is alert and oriented to person, place, and time.  Skin: Skin is warm and dry.  Psychiatric: She has a normal mood and affect.          Assessment & Plan:  Sinus pressure - has started her zyrtec. Most likely secondary to allergies that could be the beginning of an upper respiratory infection. Encouraged him to Medicare. Call if  getting worse or if not better in one week.  DM- urine micro is neg. Uncontrolled. A1C is up to 7.5.  Will add Januvia. F/U in 3 months.    Elevated BP - looks better today.    UTI - resolved.   Flu shot

## 2015-08-24 LAB — URINE CULTURE

## 2015-09-17 ENCOUNTER — Ambulatory Visit: Payer: BLUE CROSS/BLUE SHIELD | Admitting: Family Medicine

## 2015-09-22 ENCOUNTER — Ambulatory Visit: Payer: BLUE CROSS/BLUE SHIELD | Admitting: Family Medicine

## 2015-11-23 ENCOUNTER — Ambulatory Visit: Payer: BLUE CROSS/BLUE SHIELD | Admitting: Family Medicine

## 2016-01-21 ENCOUNTER — Telehealth: Payer: Self-pay

## 2016-01-21 NOTE — Telephone Encounter (Signed)
PA for Janumet sent through covermymeds. Approved.

## 2016-04-13 ENCOUNTER — Other Ambulatory Visit: Payer: Self-pay

## 2016-04-13 DIAGNOSIS — Z1231 Encounter for screening mammogram for malignant neoplasm of breast: Secondary | ICD-10-CM

## 2016-05-08 ENCOUNTER — Ambulatory Visit
Admission: RE | Admit: 2016-05-08 | Discharge: 2016-05-08 | Disposition: A | Discharge status: Home / Self Care | Payer: BLUE CROSS/BLUE SHIELD | Source: Ambulatory Visit

## 2016-05-08 DIAGNOSIS — Z1231 Encounter for screening mammogram for malignant neoplasm of breast: Secondary | ICD-10-CM | POA: Diagnosis not present

## 2016-05-24 DIAGNOSIS — M5441 Lumbago with sciatica, right side: Secondary | ICD-10-CM | POA: Diagnosis not present

## 2016-05-24 DIAGNOSIS — M5442 Lumbago with sciatica, left side: Secondary | ICD-10-CM | POA: Diagnosis not present

## 2016-06-14 ENCOUNTER — Other Ambulatory Visit: Payer: Self-pay | Admitting: Family Medicine

## 2016-06-16 ENCOUNTER — Other Ambulatory Visit: Payer: Self-pay

## 2016-06-16 MED ORDER — BAYER CONTOUR MONITOR W/DEVICE KIT: PACK | Status: DC

## 2016-06-16 MED ORDER — GLUCOSE BLOOD VI STRP: ORAL_STRIP | Status: DC

## 2016-06-16 MED ORDER — LANCETS ULTRA THIN 30G MISC: Status: DC

## 2016-06-16 NOTE — Progress Notes (Signed)
Spoke with Pt, advised she is due for follow up. Pt will contact office to schedule.

## 2016-09-01 DIAGNOSIS — J069 Acute upper respiratory infection, unspecified: Secondary | ICD-10-CM | POA: Diagnosis not present

## 2016-09-01 DIAGNOSIS — N3001 Acute cystitis with hematuria: Secondary | ICD-10-CM | POA: Diagnosis not present

## 2016-09-01 DIAGNOSIS — R350 Frequency of micturition: Secondary | ICD-10-CM | POA: Diagnosis not present

## 2016-09-01 DIAGNOSIS — B9789 Other viral agents as the cause of diseases classified elsewhere: Secondary | ICD-10-CM | POA: Diagnosis not present

## 2016-09-11 DIAGNOSIS — M79671 Pain in right foot: Secondary | ICD-10-CM | POA: Diagnosis not present

## 2016-09-11 DIAGNOSIS — E1142 Type 2 diabetes mellitus with diabetic polyneuropathy: Secondary | ICD-10-CM | POA: Diagnosis not present

## 2016-09-11 DIAGNOSIS — M79672 Pain in left foot: Secondary | ICD-10-CM | POA: Diagnosis not present

## 2016-09-11 DIAGNOSIS — L84 Corns and callosities: Secondary | ICD-10-CM | POA: Diagnosis not present

## 2016-10-04 ENCOUNTER — Encounter: Payer: Self-pay | Admitting: Family Medicine

## 2016-10-04 ENCOUNTER — Ambulatory Visit (INDEPENDENT_AMBULATORY_CARE_PROVIDER_SITE_OTHER): Payer: BLUE CROSS/BLUE SHIELD | Admitting: Family Medicine

## 2016-10-04 VITALS — BP 136/74 | HR 78 | Wt 223.0 lb

## 2016-10-04 DIAGNOSIS — R059 Cough, unspecified: Secondary | ICD-10-CM

## 2016-10-04 DIAGNOSIS — R002 Palpitations: Secondary | ICD-10-CM

## 2016-10-04 DIAGNOSIS — R3915 Urgency of urination: Secondary | ICD-10-CM | POA: Diagnosis not present

## 2016-10-04 DIAGNOSIS — E119 Type 2 diabetes mellitus without complications: Secondary | ICD-10-CM

## 2016-10-04 DIAGNOSIS — Z23 Encounter for immunization: Secondary | ICD-10-CM

## 2016-10-04 DIAGNOSIS — E1142 Type 2 diabetes mellitus with diabetic polyneuropathy: Secondary | ICD-10-CM

## 2016-10-04 DIAGNOSIS — R05 Cough: Secondary | ICD-10-CM | POA: Diagnosis not present

## 2016-10-04 LAB — POCT URINALYSIS DIPSTICK
Bilirubin, UA: NEGATIVE
Glucose, UA: NEGATIVE
KETONES UA: NEGATIVE
Leukocytes, UA: NEGATIVE
Nitrite, UA: NEGATIVE
PROTEIN UA: NEGATIVE
SPEC GRAV UA: 1.015
UROBILINOGEN UA: 0.2
pH, UA: 5.5

## 2016-10-04 LAB — LIPID PANEL
CHOL/HDL RATIO: 4.5 ratio (ref <=5.0)
Cholesterol: 168 mg/dL (ref 125–200)
HDL: 37 mg/dL — AB (ref >=46)
LDL Cholesterol: 104 mg/dL (ref <130)
Triglycerides: 135 mg/dL (ref <150)
VLDL: 27 mg/dL (ref <30)

## 2016-10-04 LAB — COMPLETE METABOLIC PANEL WITH GFR
ALBUMIN: 4 g/dL (ref 3.6–5.1)
ALK PHOS: 85 U/L (ref 33–130)
ALT: 20 U/L (ref 6–29)
AST: 21 U/L (ref 10–35)
BUN: 11 mg/dL (ref 7–25)
CHLORIDE: 101 mmol/L (ref 98–110)
CO2: 27 mmol/L (ref 20–31)
Calcium: 9.3 mg/dL (ref 8.6–10.4)
Creat: 0.74 mg/dL (ref 0.50–1.05)
GFR, Est African American: >89 mL/min (ref >=60)
GLUCOSE: 130 mg/dL — AB (ref 65–99)
POTASSIUM: 4.7 mmol/L (ref 3.5–5.3)
SODIUM: 137 mmol/L (ref 135–146)
Total Bilirubin: 0.6 mg/dL (ref 0.2–1.2)
Total Protein: 7.1 g/dL (ref 6.1–8.1)

## 2016-10-04 LAB — POCT UA - MICROALBUMIN
CREATININE, POC: 300 mg/dL
MICROALBUMIN (UR) POC: 30 mg/L

## 2016-10-04 LAB — POCT GLYCOSYLATED HEMOGLOBIN (HGB A1C): HEMOGLOBIN A1C: 7.7

## 2016-10-04 MED ORDER — GLIPIZIDE 5 MG PO TABS: 5.0000 mg | ORAL_TABLET | Freq: Two times a day (BID) | ORAL | 3 refills | Status: DC

## 2016-10-04 MED ORDER — PREGABALIN 25 MG PO CAPS: ORAL_CAPSULE | ORAL | 2 refills | Status: DC

## 2016-10-04 NOTE — Progress Notes (Addendum)
Subjective:    CC:   HPI:  Urinary frequency - She does complain of some urinary frequency for a a few months. She said her last urinary tract infection was about 6 weeks ago and she's had a couple that she. No dysuria no fevers chills or sweats or back pain with it.  Dry cough 10 days. She says she is getting a sensation like her heart is beating in the upper part of her chest and then it will be followed by cough or vice versa she can start cough and feels like her heart is beating in the upper part of her chest. She says it doesn't feel like it's racing or going fast but she just feels it. She has had some sinus pressure but no significant congestion. She's been taking over-the-counter Tylenol Sinus.  Diabetes - no hypoglycemic events. No wounds or sores that are not healing well. No increased thirst or urination. Checking glucose at home. She had a couple that ran over 200 most of them have been well controlled. Taking medications as prescribed without any side effects.  She was also seen by podiatry and diagnosed with peripheral neuropathy. She was started on gabapentin but unfortunately it really aggravated her reflux and so she had to come off of it.   Past medical history, Surgical history, Family history not pertinant except as noted below, Social history, Allergies, and medications have been entered into the medical record, reviewed, and corrections made.   Review of Systems: No fevers, chills, night sweats, weight loss, chest pain, or shortness of breath.   Objective:    General: Well Developed, well nourished, and in no acute distress.  Neuro: Alert and oriented x3, extra-ocular muscles intact, sensation grossly intact.  HEENT: Normocephalic, atraumatic  Skin: Warm and dry, no rashes. Cardiac: Regular rate and rhythm, no murmurs rubs or gallops, no lower extremity edema.  Respiratory: Clear to auscultation bilaterally. Not using accessory muscles, speaking in full  sentences.   Impression and Recommendations:    DM- Uncontrolled. 11 A1c of 7.7 today. Continue Janumet. Coupon card provided. We'll add 5 mg of glipizide daily. Continue work on [employer] and regular exercise. Follow-up in 3 months.  Cough -  Likely from post nasal drip.  Chest is clear. Cough gets worse or becomes productive, runs a fever them please let me know.  Palpitations - did do an EKG today. It sounds like she's having some palpitations but no tachycardia. It could be related to the silent Tylenol Sinus. In the cough could also be from postnasal drip. EKG today shows rate of 83 bpm, normal sinus rhythm with normal axis and no acute ST-T wave changes.  Urinary frequency-urinalysis dipstick positive for blood. We'll send for microscopic review and culture. Nitrates and leukocytes were negative so I think UTI is less likely. Will call with results once available.  Peripheral neuropathy-we'll try Lyrica since she did not tolerate gabapentin well.   Flu vaccine given today.

## 2016-10-04 NOTE — Addendum Note (Signed)
Addended by: Beatrice Lecher D on: 10/04/2016 12:11 PM   Modules accepted: Orders

## 2016-10-05 LAB — URINALYSIS, MICROSCOPIC ONLY
CRYSTALS: NONE SEEN [HPF]
Casts: NONE SEEN [LPF]
Yeast: NONE SEEN [HPF]

## 2016-10-06 ENCOUNTER — Other Ambulatory Visit: Payer: Self-pay | Admitting: Family Medicine

## 2016-10-06 LAB — URINE CULTURE

## 2016-10-07 DIAGNOSIS — Z880 Allergy status to penicillin: Secondary | ICD-10-CM | POA: Diagnosis not present

## 2016-10-07 DIAGNOSIS — K219 Gastro-esophageal reflux disease without esophagitis: Secondary | ICD-10-CM | POA: Diagnosis not present

## 2016-10-07 DIAGNOSIS — Z87442 Personal history of urinary calculi: Secondary | ICD-10-CM | POA: Diagnosis not present

## 2016-10-07 DIAGNOSIS — Z7951 Long term (current) use of inhaled steroids: Secondary | ICD-10-CM | POA: Diagnosis not present

## 2016-10-07 DIAGNOSIS — E1122 Type 2 diabetes mellitus with diabetic chronic kidney disease: Secondary | ICD-10-CM | POA: Diagnosis not present

## 2016-10-07 DIAGNOSIS — R1031 Right lower quadrant pain: Secondary | ICD-10-CM | POA: Diagnosis not present

## 2016-10-07 DIAGNOSIS — Z883 Allergy status to other anti-infective agents status: Secondary | ICD-10-CM | POA: Diagnosis not present

## 2016-10-07 DIAGNOSIS — Z9049 Acquired absence of other specified parts of digestive tract: Secondary | ICD-10-CM | POA: Diagnosis not present

## 2016-10-07 DIAGNOSIS — N189 Chronic kidney disease, unspecified: Secondary | ICD-10-CM | POA: Diagnosis not present

## 2016-10-07 DIAGNOSIS — Z885 Allergy status to narcotic agent status: Secondary | ICD-10-CM | POA: Diagnosis not present

## 2016-10-07 DIAGNOSIS — Z91041 Radiographic dye allergy status: Secondary | ICD-10-CM | POA: Diagnosis not present

## 2016-10-07 DIAGNOSIS — N201 Calculus of ureter: Secondary | ICD-10-CM | POA: Diagnosis not present

## 2016-10-07 DIAGNOSIS — Z882 Allergy status to sulfonamides status: Secondary | ICD-10-CM | POA: Diagnosis not present

## 2016-10-07 DIAGNOSIS — Z888 Allergy status to other drugs, medicaments and biological substances status: Secondary | ICD-10-CM | POA: Diagnosis not present

## 2016-10-07 DIAGNOSIS — N132 Hydronephrosis with renal and ureteral calculous obstruction: Secondary | ICD-10-CM | POA: Diagnosis not present

## 2016-10-07 DIAGNOSIS — K5730 Diverticulosis of large intestine without perforation or abscess without bleeding: Secondary | ICD-10-CM | POA: Diagnosis not present

## 2016-10-07 DIAGNOSIS — Z79899 Other long term (current) drug therapy: Secondary | ICD-10-CM | POA: Diagnosis not present

## 2016-10-07 DIAGNOSIS — Z7984 Long term (current) use of oral hypoglycemic drugs: Secondary | ICD-10-CM | POA: Diagnosis not present

## 2016-10-08 DIAGNOSIS — Z79899 Other long term (current) drug therapy: Secondary | ICD-10-CM | POA: Diagnosis not present

## 2016-10-08 DIAGNOSIS — Z888 Allergy status to other drugs, medicaments and biological substances status: Secondary | ICD-10-CM | POA: Diagnosis not present

## 2016-10-08 DIAGNOSIS — E119 Type 2 diabetes mellitus without complications: Secondary | ICD-10-CM | POA: Diagnosis not present

## 2016-10-08 DIAGNOSIS — Z880 Allergy status to penicillin: Secondary | ICD-10-CM | POA: Diagnosis not present

## 2016-10-08 DIAGNOSIS — R319 Hematuria, unspecified: Secondary | ICD-10-CM | POA: Diagnosis not present

## 2016-10-08 DIAGNOSIS — N200 Calculus of kidney: Secondary | ICD-10-CM | POA: Diagnosis not present

## 2016-10-08 DIAGNOSIS — R1031 Right lower quadrant pain: Secondary | ICD-10-CM | POA: Diagnosis not present

## 2016-10-08 DIAGNOSIS — N23 Unspecified renal colic: Secondary | ICD-10-CM | POA: Diagnosis not present

## 2016-10-08 DIAGNOSIS — N189 Chronic kidney disease, unspecified: Secondary | ICD-10-CM | POA: Diagnosis not present

## 2016-10-08 DIAGNOSIS — Z91041 Radiographic dye allergy status: Secondary | ICD-10-CM | POA: Diagnosis not present

## 2016-10-08 DIAGNOSIS — Z883 Allergy status to other anti-infective agents status: Secondary | ICD-10-CM | POA: Diagnosis not present

## 2016-10-08 DIAGNOSIS — K219 Gastro-esophageal reflux disease without esophagitis: Secondary | ICD-10-CM | POA: Diagnosis not present

## 2016-10-08 DIAGNOSIS — Z885 Allergy status to narcotic agent status: Secondary | ICD-10-CM | POA: Diagnosis not present

## 2016-10-08 DIAGNOSIS — Z7951 Long term (current) use of inhaled steroids: Secondary | ICD-10-CM | POA: Diagnosis not present

## 2016-12-13 DIAGNOSIS — J019 Acute sinusitis, unspecified: Secondary | ICD-10-CM | POA: Diagnosis not present

## 2017-01-04 ENCOUNTER — Encounter: Payer: Self-pay | Admitting: Osteopathic Medicine

## 2017-01-04 ENCOUNTER — Ambulatory Visit (INDEPENDENT_AMBULATORY_CARE_PROVIDER_SITE_OTHER): Payer: BLUE CROSS/BLUE SHIELD | Admitting: Osteopathic Medicine

## 2017-01-04 VITALS — BP 137/82 | HR 91 | Ht 67.0 in | Wt 226.0 lb

## 2017-01-04 DIAGNOSIS — J019 Acute sinusitis, unspecified: Secondary | ICD-10-CM

## 2017-01-04 MED ORDER — IPRATROPIUM BROMIDE 0.03 % NA SOLN: 2.0000 | Freq: Four times a day (QID) | NASAL | 0 refills | Status: DC

## 2017-01-04 MED ORDER — DOXYCYCLINE MONOHYDRATE 100 MG PO CAPS: 100.0000 mg | ORAL_CAPSULE | Freq: Two times a day (BID) | ORAL | 0 refills | Status: DC

## 2017-01-04 MED ORDER — BENZONATATE 200 MG PO CAPS: 200.0000 mg | ORAL_CAPSULE | Freq: Three times a day (TID) | ORAL | 1 refills | Status: DC | PRN

## 2017-01-04 NOTE — Patient Instructions (Signed)
Note: the following list assumes no pregnancy, normal liver & kidney function and no other drug interactions. Dr. Sheppard Coil has highlighted medications which are safe for you to use, but these may not be appropriate for everyone. Always ask a pharmacist or qualified medical provider if there are any questions!    Aches/Pains, Fever Acetaminophen (Tylenol) 500 mg tablets - take max 2 tablets (1000 mg) every 6 hours (4 times per day)  Ibuprofen (Motrin) 200 mg tablets - take max 4 tablets (800 mg) every 6 hours  Sinus Congestion Prescription Atrovent Cromolyn Nasal Spray (NasalCrom) 1 spray each nostril 3-4 times per day, max 6 imes per day Nasal Saline if desired Oxymetolazone (Afrin, others) sparing use due to rebound congestion Phenylephrine (Sudafed) 10 mg tablets every 4 hours (or the 12-hour formulation) Diphenhydramine (Benadryl) 25 mg tablets - take max 2 tablets every 4 hours  Cough & Sore Throat Prescription cough pills or syrups Dextromethorphan (Robitussin, others) - cough suppressant Guaifenesin (Robitussin, Mucinex, others) - expectorant (helps cough up mucus) (Dextromethorphan and Guaifenesin also come in a combination tablet) Lozenges w/ Benzocaine + Menthol (Cepacol) Honey - as much as you want! Teas which "coat the throat" - look for ingredients Elm Bark, Licorice Root, Marshmallow Root  Other Zinc Lozenges within 24 hours of symptoms onset - mixed evidence this shortens the duration of the common cold Don't waste your money on Vitamin C or Echinacea

## 2017-01-04 NOTE — Progress Notes (Signed)
HPI: Nicole Mata is a 56 y.o. female who presents to [place] 01/04/17 for chief complaint of:  Chief Complaint  Patient presents with  . Sinus Problem    Acute Illness: . Context:   . Location:  . Quality: pressure  . Assoc signs/symptoms: see ROS . Duration: more than a month  . Modifying factors: has tried the following OTC/Rx medications: Tylenol Sinus, Day/Ny-Quil, OTC eardrops   Past medical, social and family history reviewed. Current medications and allergies reviewed.     Review of Systems:  Constitutional: No  fever/chills  HEENT: Yes  headache, No  sore throat, No  swollen glands  Cardiovascular: No chest pain  Respiratory:Yes  cough, No  shortness of breath  Gastrointestinal: No  nausea, No  vomiting,  No  diarrhea  Musculoskeletal:   Yes  myalgia/arthralgia  Skin/Integument:  No  rash   Exam:  BP 137/82   Pulse 91   Ht 5\' 7"  (1.702 m)   Wt 226 lb (102.5 kg)   BMI 35.40 kg/m   Constitutional: VSS, see above. General Appearance: alert, well-developed, well-nourished, NAD  Eyes: Normal lids and conjunctive, non-icteric sclera, PERRLA  Ears, Nose, Mouth, Throat: Normal external inspection ears/nares/mouth/lips/gums, normal TM, MMM; posterior pharynx without erythema, without exudate, nasal mucosa normal  Neck: No masses, trachea midline. normal lymph nodes  Respiratory: Normal respiratory effort. No  wheeze/rhonchi/rales  Cardiovascular: S1/S2 normal, no murmur/rub/gallop auscultated. RRR.    ASSESSMENT/PLAN: OTC med list printed, consider flonase/other nasal steroid for allergies  Acute sinusitis, recurrence not specified, unspecified location - Plan: doxycycline (MONODOX) 100 MG capsule, ipratropium (ATROVENT) 0.03 % nasal spray, benzonatate (TESSALON) 200 MG capsule     Visit summary was printed for the patient with medications and pertinent instructions for patient to review. ER/RTC precautions  reviewed. All questions answered. Return if symptoms worsen or fail to improve.

## 2017-01-08 ENCOUNTER — Ambulatory Visit: Payer: BLUE CROSS/BLUE SHIELD | Admitting: Family Medicine

## 2017-01-17 ENCOUNTER — Telehealth: Payer: Self-pay | Admitting: Family Medicine

## 2017-01-17 ENCOUNTER — Ambulatory Visit (INDEPENDENT_AMBULATORY_CARE_PROVIDER_SITE_OTHER): Payer: BLUE CROSS/BLUE SHIELD | Admitting: Family Medicine

## 2017-01-17 DIAGNOSIS — H8113 Benign paroxysmal vertigo, bilateral: Secondary | ICD-10-CM | POA: Diagnosis not present

## 2017-01-17 DIAGNOSIS — IMO0001 Reserved for inherently not codable concepts without codable children: Secondary | ICD-10-CM

## 2017-01-17 DIAGNOSIS — J329 Chronic sinusitis, unspecified: Secondary | ICD-10-CM

## 2017-01-17 DIAGNOSIS — M7711 Lateral epicondylitis, right elbow: Secondary | ICD-10-CM | POA: Diagnosis not present

## 2017-01-17 DIAGNOSIS — E1165 Type 2 diabetes mellitus with hyperglycemia: Secondary | ICD-10-CM | POA: Diagnosis not present

## 2017-01-17 LAB — POCT GLYCOSYLATED HEMOGLOBIN (HGB A1C): Hemoglobin A1C: 8.1

## 2017-01-17 MED ORDER — MECLIZINE HCL 12.5 MG PO TABS: 12.5000 mg | ORAL_TABLET | Freq: Three times a day (TID) | ORAL | 0 refills | Status: DC | PRN

## 2017-01-17 MED ORDER — LEVOFLOXACIN 500 MG PO TABS: 500.0000 mg | ORAL_TABLET | Freq: Every day | ORAL | 0 refills | Status: AC

## 2017-01-17 MED ORDER — MELOXICAM 15 MG PO TABS: 15.0000 mg | ORAL_TABLET | Freq: Every day | ORAL | 3 refills | Status: DC

## 2017-01-17 NOTE — Telephone Encounter (Signed)
Please call patient and see if we can schedule a follow-up in the next couple of weeks. We need to discuss her diabetes as her A1c was uncontrolled.  Nicole Lecher, MD

## 2017-01-17 NOTE — Progress Notes (Signed)
Subjective:    CC: Dizziness -     HPI:  Patient comes in today complaining of dizziness. She describes it as an off-balance sensation and feels like the room is spinning a little bit. She describes it as a horizontal spinning versus vertical. She says is triggered if she moves her head quickly. It's better if she just lays flat and doesn't move. She says it started really last night when she had just eaten a protein shake and then sat on the couch and started to feel it abruptly. It happened again when she got in the shower last night. She noticed it again this morning but it wasn't nearly as intense. She did note though that last Thursday approximately 7 days ago she had an episode where she saw some stars in her visual field and when she got home her blood pressure was elevated at 139/90 that day. She was treated for a sinus infection about 2 weeks ago with doxycycline for 7 days. She said she did feel better until the last couple days where she feels like she's got a lot of left maxillary sinus pressure and congestion. She also has some discomfort going into the left side of her neck.  Past medical history, Surgical history, Family history not pertinant except as noted below, Social history, Allergies, and medications have been entered into the medical record, reviewed, and corrections made.   Review of Systems: No fevers, chills, night sweats, weight loss, chest pain, or shortness of breath.   Objective:    General: Well Developed, well nourished, and in no acute distress.  Neuro: Alert and oriented x3, extra-ocular muscles intact, sensation grossly intact.  HEENT: Normocephalic, atraumaticOropharynx is clear, TMs and canals are clear bilaterally. No significant cervical lymphadenopathy.  Skin: Warm and dry, no rashes. Cardiac: Regular rate and rhythm, no murmurs rubs or gallops, no lower extremity edema.  Respiratory: Clear to auscultation bilaterally. Not using accessory muscles, speaking  in full sentences. Neuro: Positive Dix-Hallpike maneuver to the right and left which is a little bit unusual. She actually had vertical nystagmus.   Impression and Recommendations:    BPV- Suspect BPV based on presentation. We'll go ahead and treat with Antivert and give her handout on exercises to do on her own at home. If symptoms have not resolved after 10 days and please let me know. At that point consider formal vestibular rehabilitation and possibly even brain MRI since she does have vertical nystagmus. Also consider this could be related to recent sinus infections on Her on a second round of antibiotics. She is intolerant to prednisone. Consider vestibulitis as well.Orthostatics were normal.  Recurrent sinusitis  - we'll treat with Levaquin. She did finish a seven-day course of doxycycline. She cannot take prednisone.

## 2017-01-17 NOTE — Patient Instructions (Signed)
Call if symptoms are not better in 10 days.

## 2017-01-17 NOTE — Telephone Encounter (Signed)
Sorry, just sent new Rx.

## 2017-01-17 NOTE — Telephone Encounter (Signed)
Pt advised. She has an upcoming appt in March.   Questions if PCP was going to write the Rx for Antivert, it was not sent to the pharmacy.

## 2017-02-06 ENCOUNTER — Ambulatory Visit (INDEPENDENT_AMBULATORY_CARE_PROVIDER_SITE_OTHER): Payer: BLUE CROSS/BLUE SHIELD | Admitting: Family Medicine

## 2017-02-06 ENCOUNTER — Encounter: Payer: Self-pay | Admitting: Family Medicine

## 2017-02-06 VITALS — BP 130/70 | HR 83 | Ht 67.0 in | Wt 225.0 lb

## 2017-02-06 DIAGNOSIS — Z23 Encounter for immunization: Secondary | ICD-10-CM | POA: Diagnosis not present

## 2017-02-06 DIAGNOSIS — Z0000 Encounter for general adult medical examination without abnormal findings: Secondary | ICD-10-CM

## 2017-02-06 NOTE — Patient Instructions (Signed)
Keep up a regular exercise program and make sure you are eating a healthy diet Try to eat 4 servings of dairy a day, or if you are lactose intolerant take a calcium with vitamin D daily.  Your vaccines are up to date.   

## 2017-02-06 NOTE — Progress Notes (Signed)
Subjective:     Nicole Mata is a 56 y.o. female and is here for a comprehensive physical exam. The patient reports no problems. Eye exam schedule in April.    Social History   Social History  . Marital status: Divorced    Spouse name: N/A  . Number of children: 0  . Years of education: N/A   Occupational History  . Customer Service.     Social History Main Topics  . Smoking status: Never Smoker  . Smokeless tobacco: Never Used  . Alcohol use No  . Drug use: No  . Sexual activity: Yes    Partners: Male     Comment: divorced, no kids, customer service, 2 caffeine drinks daily.   Other Topics Concern  . Not on file   Social History Narrative   1 can of soda a day.  No regular exercise.    Health Maintenance  Topic Date Due  . HIV Screening  07/02/1978  . OPHTHALMOLOGY EXAM  03/17/2016  . HEMOGLOBIN A1C  07/17/2017  . COLONOSCOPY  09/25/2017  . FOOT EXAM  10/04/2017  . URINE MICROALBUMIN  10/04/2017  . PAP SMEAR  02/05/2018  . MAMMOGRAM  05/08/2018  . TETANUS/TDAP  12/04/2018  . PNEUMOCOCCAL POLYSACCHARIDE VACCINE (2) 12/16/2019  . INFLUENZA VACCINE  Completed  . Hepatitis C Screening  Completed    The following portions of the patient's history were reviewed and updated as appropriate: allergies, current medications, past family history, past medical history, past social history, past surgical history and problem list.  Review of Systems A comprehensive review of systems was negative.   Objective:    There were no vitals taken for this visit. General appearance: alert, cooperative and appears stated age Head: Normocephalic, without obvious abnormality, atraumatic Eyes: conj clear, EOMI, PEERLA Ears: normal TM's and external ear canals both ears Nose: Nares normal. Septum midline. Mucosa normal. No drainage or sinus tenderness. Throat: lips, mucosa, and tongue normal; teeth and gums normal Neck: no adenopathy, no carotid bruit, no JVD, supple, symmetrical,  trachea midline and thyroid not enlarged, symmetric, no tenderness/mass/nodules Back: symmetric, no curvature. ROM normal. No CVA tenderness. Lungs: clear to auscultation bilaterally Heart: regular rate and rhythm, S1, S2 normal, no murmur, click, rub or gallop Abdomen: soft, non-tender; bowel sounds normal; no masses,  no organomegaly Extremities: extremities normal, atraumatic, no cyanosis or edema Pulses: 2+ and symmetric Skin: Skin color, texture, turgor normal. No rashes or lesions Lymph nodes: Cervical, supraclavicular, and axillary nodes normal. Neurologic: Alert and oriented X 3, normal strength and tone. Normal symmetric reflexes. Normal coordination and gait    Assessment:    Healthy female exam.      Plan:     See After Visit Summary for Counseling Recommendations   Keep up a regular exercise program and make sure you are eating a healthy diet Try to eat 4 servings of dairy a day, or if you are lactose intolerant take a calcium with vitamin D daily.  Your vaccines are up to date.  Tdap given today.

## 2017-02-13 DIAGNOSIS — K219 Gastro-esophageal reflux disease without esophagitis: Secondary | ICD-10-CM | POA: Diagnosis not present

## 2017-02-13 DIAGNOSIS — R194 Change in bowel habit: Secondary | ICD-10-CM | POA: Diagnosis not present

## 2017-02-13 DIAGNOSIS — K449 Diaphragmatic hernia without obstruction or gangrene: Secondary | ICD-10-CM | POA: Diagnosis not present

## 2017-04-17 ENCOUNTER — Ambulatory Visit (INDEPENDENT_AMBULATORY_CARE_PROVIDER_SITE_OTHER): Payer: BLUE CROSS/BLUE SHIELD | Admitting: Family Medicine

## 2017-04-17 ENCOUNTER — Encounter: Payer: Self-pay | Admitting: Family Medicine

## 2017-04-17 VITALS — BP 120/66 | HR 88 | Ht 67.0 in | Wt 226.0 lb

## 2017-04-17 DIAGNOSIS — M5416 Radiculopathy, lumbar region: Secondary | ICD-10-CM

## 2017-04-17 DIAGNOSIS — E7800 Pure hypercholesterolemia, unspecified: Secondary | ICD-10-CM | POA: Diagnosis not present

## 2017-04-17 DIAGNOSIS — E1165 Type 2 diabetes mellitus with hyperglycemia: Secondary | ICD-10-CM

## 2017-04-17 DIAGNOSIS — IMO0001 Reserved for inherently not codable concepts without codable children: Secondary | ICD-10-CM

## 2017-04-17 DIAGNOSIS — E1142 Type 2 diabetes mellitus with diabetic polyneuropathy: Secondary | ICD-10-CM | POA: Diagnosis not present

## 2017-04-17 LAB — BASIC METABOLIC PANEL WITH GFR
BUN: 14 mg/dL (ref 7–25)
CHLORIDE: 103 mmol/L (ref 98–110)
CO2: 23 mmol/L (ref 20–31)
Calcium: 9.2 mg/dL (ref 8.6–10.4)
Creat: 0.78 mg/dL (ref 0.50–1.05)
GFR, Est Non African American: 87 mL/min (ref >=60)
GLUCOSE: 152 mg/dL — AB (ref 65–99)
POTASSIUM: 4.6 mmol/L (ref 3.5–5.3)
Sodium: 139 mmol/L (ref 135–146)

## 2017-04-17 LAB — POCT GLYCOSYLATED HEMOGLOBIN (HGB A1C): HEMOGLOBIN A1C: 7.1

## 2017-04-17 MED ORDER — PREGABALIN 25 MG PO CAPS: 25.0000 mg | ORAL_CAPSULE | Freq: Two times a day (BID) | ORAL | 3 refills | Status: DC

## 2017-04-17 MED ORDER — PRAVASTATIN SODIUM 40 MG PO TABS: 40.0000 mg | ORAL_TABLET | Freq: Every day | ORAL | 3 refills | Status: DC

## 2017-04-17 NOTE — Progress Notes (Addendum)
Subjective:    CC: DM  HPI: Diabetes - no hypoglycemic events. No wounds or sores that are not healing well. No increased thirst or urination. Checking glucose at home.Sugars running in the 150 range. More recently has started walking in the evenings. Taking medications as prescribed without any side effects. Had eye exam done at [facility].    Peripheral Neuropathy - Currently on Lyrica. She does need a RF. Her burningi n her feet has seemed to increase lately. The recent walking has also triggered some left low back pain with some radiculopathy going down her leg. She has been seeing a Restaurant manager, fast food for that.  Patient says that she was unable to tolerate the atorvastatin because of muscle aches. She is willing to try something new.   Past medical history, Surgical history, Family history not pertinant except as noted below, Social history, Allergies, and medications have been entered into the medical record, reviewed, and corrections made.   Review of Systems: No fevers, chills, night sweats, weight loss, chest pain, or shortness of breath.   Objective:    General: Well Developed, well nourished, and in no acute distress.  Neuro: Alert and oriented x3, extra-ocular muscles intact, sensation grossly intact.  HEENT: Normocephalic, atraumatic  Skin: Warm and dry, no rashes. Cardiac: Regular rate and rhythm, no murmurs rubs or gallops, no lower extremity edema.  Respiratory: Clear to auscultation bilaterally. Not using accessory muscles, speaking in full sentences.   Impression and Recommendations:    DM- Uncontrolled but Hemoccult A1c is significantly better. Down to 7.1 which is fantastic. We discussed options. She really wants to continue to work on diet and exercise and has recently started walking again. We'll focus on this with the next 3 months and not make any changes to her medication regimen today.   Hyperlipidemia-Off of her statin because of side effects but is willing to  try something else.   Peripheral Neuropathy - RF Lyrica. Continue to work on exercise. N/A she has been having more symptoms lately I did not adjust her medication. I still want to work on getting her A1c under maximal control.  Left low back pain with radiculopathy-recommend heat stretches and to continue with her walking. Continue chiropractic care and if not improving them please let me know.

## 2017-04-17 NOTE — Progress Notes (Signed)
7.1 

## 2017-04-18 NOTE — Progress Notes (Signed)
All labs are normal. 

## 2017-05-18 ENCOUNTER — Telehealth: Payer: Self-pay | Admitting: *Deleted

## 2017-05-18 NOTE — Telephone Encounter (Signed)
Called pt back and lvm informing her that I would fwd to pcp for advice.Nicole Mata

## 2017-05-18 NOTE — Telephone Encounter (Signed)
Called pt and informed her that Dr. Madilyn Fireman advised she takes 50 mg BID for 1 mo and call back after that if no better. Pt voiced understanding and agreed.Nicole Mata

## 2017-05-18 NOTE — Telephone Encounter (Signed)
Pt lvm asking if she can stop taking lyrica. She stated that its really not helping and she is not feeling right her legs still hurt more often. Maryruth Eve, L[place] Crocker

## 2017-05-18 NOTE — Telephone Encounter (Signed)
I would recommend we try a higher dose then. She is on the lowest dose available.  I would say lets try a higher strength for a month and then let me know if not better.  Also if she feels is realted to her joints or muscles then we can have her see sports med next week.

## 2017-06-21 ENCOUNTER — Encounter: Payer: Self-pay | Admitting: Sports Medicine

## 2017-06-21 ENCOUNTER — Other Ambulatory Visit: Payer: Self-pay | Admitting: Family Medicine

## 2017-06-21 ENCOUNTER — Ambulatory Visit (INDEPENDENT_AMBULATORY_CARE_PROVIDER_SITE_OTHER): Payer: BLUE CROSS/BLUE SHIELD | Admitting: Sports Medicine

## 2017-06-21 ENCOUNTER — Ambulatory Visit (INDEPENDENT_AMBULATORY_CARE_PROVIDER_SITE_OTHER): Payer: BLUE CROSS/BLUE SHIELD

## 2017-06-21 DIAGNOSIS — M47816 Spondylosis without myelopathy or radiculopathy, lumbar region: Secondary | ICD-10-CM

## 2017-06-21 DIAGNOSIS — Z1231 Encounter for screening mammogram for malignant neoplasm of breast: Secondary | ICD-10-CM

## 2017-06-21 DIAGNOSIS — M545 Low back pain: Secondary | ICD-10-CM | POA: Diagnosis not present

## 2017-06-21 NOTE — Progress Notes (Signed)
   Subjective:    I'm seeing this patient as a consultation for:  Dr. Beatrice Lecher  CC: Low back pain  HPI: For 2 years this pleasant 56 year old female has had back pain on and off, left-sided, low back around the sacroiliac joint, worse with going from sitting to standing, prolonged walking, and laying in bed sleeping at night. No trauma, no bowel or bladder dysfunction, saddle numbness, occasional radiation to the left lateral thigh but usually not past the knee. She does have baseline diabetic peripheral neuropathy thus treated with Lyrica. Pain is moderate, persistent.  Past medical history, Surgical history, Family history not pertinant except as noted below, Social history, Allergies, and medications have been entered into the medical record, reviewed, and no changes needed.   Review of Systems: No headache, visual changes, nausea, vomiting, diarrhea, constipation, dizziness, abdominal pain, skin rash, fevers, chills, night sweats, weight loss, swollen lymph nodes, body aches, joint swelling, muscle aches, chest pain, shortness of breath, mood changes, visual or auditory hallucinations.   Objective:   General: Well Developed, well nourished, and in no acute distress.  Neuro:  Extra-ocular muscles intact, able to move all 4 extremities, sensation grossly intact.  Deep tendon reflexes tested were normal. Psych: Alert and oriented, mood congruent with affect. ENT:  Ears and nose appear unremarkable.  Hearing grossly normal. Neck: Unremarkable overall appearance, trachea midline.  No visible thyroid enlargement. Eyes: Conjunctivae and lids appear unremarkable.  Pupils equal and round. Skin: Warm and dry, no rashes noted.  Cardiovascular: Pulses palpable, no extremity edema. Back Exam:  Inspection: Unremarkable  Motion: Flexion 45 deg, Extension 45 deg, Side Bending to 45 deg bilaterally,  Rotation to 45 deg bilaterally  SLR laying: Negative  XSLR laying: Negative  Palpable  tenderness: Left sacroiliac joint. No tenderness over the greater trochanteric bursa on the left FABER: negative. Sensory change: Gross sensation intact to all lumbar and sacral dermatomes.  Reflexes: 2+ at both patellar tendons, 2+ at achilles tendons, Babinski's downgoing.  Strength at foot  Plantar-flexion: 5/5 Dorsi-flexion: 5/5 Eversion: 5/5 Inversion: 5/5  Leg strength  Quad: 5/5 Hamstring: 5/5 Hip flexor: 5/5 Hip abductors: 5/5  Gait unremarkable.  Impression and Recommendations:   This case required medical decision making of moderate complexity.  Lumbar spondylosis Both facetogenic and discogenic components. We will start conservatively with physical therapy, meloxicam which she has already, x-rays, return in one month, MRI for interventional planning if no better.

## 2017-06-21 NOTE — Assessment & Plan Note (Signed)
Both facetogenic and discogenic components. We will start conservatively with physical therapy, meloxicam which she has already, x-rays, return in one month, MRI for interventional planning if no better.

## 2017-07-05 ENCOUNTER — Ambulatory Visit (INDEPENDENT_AMBULATORY_CARE_PROVIDER_SITE_OTHER): Payer: BLUE CROSS/BLUE SHIELD | Admitting: Osteopathic Medicine

## 2017-07-05 ENCOUNTER — Encounter: Payer: Self-pay | Admitting: Osteopathic Medicine

## 2017-07-05 VITALS — BP 116/75 | HR 85 | Ht 67.0 in | Wt 230.0 lb

## 2017-07-05 DIAGNOSIS — B9689 Other specified bacterial agents as the cause of diseases classified elsewhere: Secondary | ICD-10-CM

## 2017-07-05 DIAGNOSIS — J329 Chronic sinusitis, unspecified: Secondary | ICD-10-CM

## 2017-07-05 MED ORDER — BENZONATATE 200 MG PO CAPS: 200.0000 mg | ORAL_CAPSULE | Freq: Three times a day (TID) | ORAL | 0 refills | Status: DC | PRN

## 2017-07-05 MED ORDER — DOXYCYCLINE MONOHYDRATE 100 MG PO CAPS: 100.0000 mg | ORAL_CAPSULE | Freq: Two times a day (BID) | ORAL | 0 refills | Status: DC

## 2017-07-05 NOTE — Progress Notes (Signed)
HPI: Nicole Mata is a 56 y.o. female who presents to [facility] 07/05/17 for chief complaint of:  Chief Complaint  Patient presents with  . Sinusitis    Acute Illness: . Location/Quality: sinus congestion and dry cough . Assoc signs/symptoms: see ROS . Duration: 3+ weeks . Modifying factors: has tried the following OTC/Rx medications: Tylenol cold and sinus    Past medical, social and family history reviewed.  Immune compromising conditions or other risk factors: Dm2  Current medications and allergies reviewed.     Review of Systems:  Constitutional: No  fever/chills  HEENT: sinus headache, mild sore throat, No  swollen glands  Cardiovascular: No chest pain  Respiratory:Yes  cough, No  shortness of breath   Detailed Exam:  BP 116/75   Pulse 85   Ht 5\' 7"  (1.702 m)   Wt 230 lb (104.3 kg)   BMI 36.02 kg/m   Constitutional:   VSS, see above.   General Appearance: alert, well-developed, well-nourished, NAD  Eyes:   Normal lids and conjunctive, non-icteric sclera  Ears, Nose, Mouth, Throat:   Normal external inspection ears/nares  Normal mouth/lips/gums, MMM  normal TM  posterior pharynx without erythema, without exudate  nasal mucosa normal  Skin:  Normal inspection, no rash or concerning lesions noted on limited exam  Neck:   No masses, trachea midline. normal lymph nodes  Respiratory:   Normal respiratory effort.   No  wheeze/rhonchi/rales  Cardiovascular:   S1/S2 normal, no murmur/rub/gallop auscultated. RRR.     ASSESSMENT/PLAN:  Bacterial sinusitis - Plan: doxycycline (MONODOX) 100 MG capsule, benzonatate (TESSALON) 200 MG capsule, has Atrovent nasal spray at home, advised use this. Let us know if no better after 5 days on abx      Visit summary was printed for the patient with medications and pertinent instructions for patient to review. ER/RTC precautions reviewed. All questions  answered. Return for routine care as directed by PCP.

## 2017-07-18 ENCOUNTER — Encounter: Payer: Self-pay | Admitting: Family Medicine

## 2017-07-18 ENCOUNTER — Ambulatory Visit (INDEPENDENT_AMBULATORY_CARE_PROVIDER_SITE_OTHER): Payer: BLUE CROSS/BLUE SHIELD | Admitting: Sports Medicine

## 2017-07-18 ENCOUNTER — Encounter: Payer: Self-pay | Admitting: Sports Medicine

## 2017-07-18 ENCOUNTER — Ambulatory Visit (INDEPENDENT_AMBULATORY_CARE_PROVIDER_SITE_OTHER): Payer: BLUE CROSS/BLUE SHIELD

## 2017-07-18 ENCOUNTER — Ambulatory Visit (INDEPENDENT_AMBULATORY_CARE_PROVIDER_SITE_OTHER): Payer: BLUE CROSS/BLUE SHIELD | Admitting: Family Medicine

## 2017-07-18 VITALS — BP 124/67 | HR 98 | Wt 228.0 lb

## 2017-07-18 DIAGNOSIS — E7800 Pure hypercholesterolemia, unspecified: Secondary | ICD-10-CM

## 2017-07-18 DIAGNOSIS — Z1231 Encounter for screening mammogram for malignant neoplasm of breast: Secondary | ICD-10-CM | POA: Diagnosis not present

## 2017-07-18 DIAGNOSIS — E119 Type 2 diabetes mellitus without complications: Secondary | ICD-10-CM | POA: Diagnosis not present

## 2017-07-18 DIAGNOSIS — E1165 Type 2 diabetes mellitus with hyperglycemia: Secondary | ICD-10-CM

## 2017-07-18 DIAGNOSIS — N132 Hydronephrosis with renal and ureteral calculous obstruction: Secondary | ICD-10-CM | POA: Diagnosis not present

## 2017-07-18 DIAGNOSIS — N134 Hydroureter: Secondary | ICD-10-CM | POA: Diagnosis not present

## 2017-07-18 DIAGNOSIS — Z7984 Long term (current) use of oral hypoglycemic drugs: Secondary | ICD-10-CM | POA: Diagnosis not present

## 2017-07-18 DIAGNOSIS — E1142 Type 2 diabetes mellitus with diabetic polyneuropathy: Secondary | ICD-10-CM | POA: Diagnosis not present

## 2017-07-18 DIAGNOSIS — IMO0001 Reserved for inherently not codable concepts without codable children: Secondary | ICD-10-CM

## 2017-07-18 DIAGNOSIS — R1084 Generalized abdominal pain: Secondary | ICD-10-CM | POA: Diagnosis not present

## 2017-07-18 DIAGNOSIS — E1122 Type 2 diabetes mellitus with diabetic chronic kidney disease: Secondary | ICD-10-CM | POA: Diagnosis not present

## 2017-07-18 DIAGNOSIS — R1031 Right lower quadrant pain: Secondary | ICD-10-CM | POA: Diagnosis not present

## 2017-07-18 DIAGNOSIS — K5730 Diverticulosis of large intestine without perforation or abscess without bleeding: Secondary | ICD-10-CM | POA: Diagnosis not present

## 2017-07-18 DIAGNOSIS — N200 Calculus of kidney: Secondary | ICD-10-CM | POA: Diagnosis not present

## 2017-07-18 DIAGNOSIS — M47816 Spondylosis without myelopathy or radiculopathy, lumbar region: Secondary | ICD-10-CM

## 2017-07-18 DIAGNOSIS — R1032 Left lower quadrant pain: Secondary | ICD-10-CM | POA: Diagnosis not present

## 2017-07-18 LAB — POCT GLYCOSYLATED HEMOGLOBIN (HGB A1C): HEMOGLOBIN A1C: 7.7

## 2017-07-18 MED ORDER — GLIPIZIDE 10 MG PO TABS: 10.0000 mg | ORAL_TABLET | Freq: Two times a day (BID) | ORAL | 1 refills | Status: DC

## 2017-07-18 NOTE — Assessment & Plan Note (Addendum)
Facetogenic and discogenic components back pain. Does well with meloxicam. Never did physical therapy, I will give her some home rehabilitation exercises and if she continues to have pain she will eventually have to do formal PT. It would also helpful if she could lose maybe 30-40 pounds.

## 2017-07-18 NOTE — Progress Notes (Signed)
  Subjective:    CC: Follow-up  HPI: Lumbar spondylosis: Unfortunately never did physical therapy, improving to some degree.  Past medical history:  Negative.  See flowsheet/record as well for more information.  Surgical history: Negative.  See flowsheet/record as well for more information.  Family history: Negative.  See flowsheet/record as well for more information.  Social history: Negative.  See flowsheet/record as well for more information.  Allergies, and medications have been entered into the medical record, reviewed, and no changes needed.   Review of Systems: No fevers, chills, night sweats, weight loss, chest pain, or shortness of breath.   Objective:    General: Well Developed, well nourished, and in no acute distress.  Neuro: Alert and oriented x3, extra-ocular muscles intact, sensation grossly intact.  HEENT: Normocephalic, atraumatic, pupils equal round reactive to light, neck supple, no masses, no lymphadenopathy, thyroid nonpalpable.  Skin: Warm and dry, no rashes. Cardiac: Regular rate and rhythm, no murmurs rubs or gallops, no lower extremity edema.  Respiratory: Clear to auscultation bilaterally. Not using accessory muscles, speaking in full sentences.  Impression and Recommendations:    Lumbar spondylosis Facetogenic and discogenic components back pain. Does well with meloxicam. Never did physical therapy, I will give her some home rehabilitation exercises and if she continues to have pain she will eventually have to do formal PT. It would also helpful if she could lose maybe 30-40 pounds.

## 2017-07-18 NOTE — Progress Notes (Signed)
Subjective:    CC:   HPI:  Diabetes - no hypoglycemic events. No wounds or sores that are not healing well. No increased thirst or urination. Checking glucose at home. Taking medications as prescribed without any side effects.  Peripheral neuropathy-we did increase her Lyrica but she said it was actually too sedating so she actually went back down to 25 mg. She says the neuropathy actually seems to be a little bit better over the last 2-3 weeks on its own.  Hyperlipidemia-tolerating pravastatin well without any side effects or myalgias.  Past medical history, Surgical history, Family history not pertinant except as noted below, Social history, Allergies, and medications have been entered into the medical record, reviewed, and corrections made.   Review of Systems: No fevers, chills, night sweats, weight loss, chest pain, or shortness of breath.   Objective:    General: Well Developed, well nourished, and in no acute distress.  Neuro: Alert and oriented x3, extra-ocular muscles intact, sensation grossly intact.  HEENT: Normocephalic, atraumatic  Skin: Warm and dry, no rashes. Cardiac: Regular rate and rhythm, no murmurs rubs or gallops, no lower extremity edema.  Respiratory: Clear to auscultation bilaterally. Not using accessory muscles, speaking in full sentences.   Impression and Recommendations:     DM- uncontrolled. Hemoglobin A1c at 7.7. Will increase quickset 10 mg daily. Follow-up in 3 months. Will call for recent [facility]  Peripheral neuropathy-went back down on her Lyrica but doing well overall. We'll continue with current regimen for now.  Hyperlipidemia-continue current regimen. Due for CMP and fasting lipid panel.  Mammogram scheduled today.

## 2017-07-18 NOTE — Patient Instructions (Signed)
PIck up higher dose of glipizide.

## 2017-07-19 DIAGNOSIS — K5730 Diverticulosis of large intestine without perforation or abscess without bleeding: Secondary | ICD-10-CM | POA: Diagnosis not present

## 2017-07-19 DIAGNOSIS — N134 Hydroureter: Secondary | ICD-10-CM | POA: Diagnosis not present

## 2017-07-19 DIAGNOSIS — R1084 Generalized abdominal pain: Secondary | ICD-10-CM | POA: Diagnosis not present

## 2017-07-19 DIAGNOSIS — N132 Hydronephrosis with renal and ureteral calculous obstruction: Secondary | ICD-10-CM | POA: Diagnosis not present

## 2017-07-19 LAB — COMPLETE METABOLIC PANEL WITH GFR
ALBUMIN: 3.9 g/dL (ref 3.6–5.1)
ALT: 15 U/L (ref 6–29)
AST: 16 U/L (ref 10–35)
Alkaline Phosphatase: 74 U/L (ref 33–130)
BILIRUBIN TOTAL: 0.4 mg/dL (ref 0.2–1.2)
BUN: 12 mg/dL (ref 7–25)
CO2: 20 mmol/L (ref 20–32)
CREATININE: 0.66 mg/dL (ref 0.50–1.05)
Calcium: 9.2 mg/dL (ref 8.6–10.4)
Chloride: 104 mmol/L (ref 98–110)
GFR, Est African American: >89 mL/min (ref >=60)
GFR, Est Non African American: >89 mL/min (ref >=60)
GLUCOSE: 157 mg/dL — AB (ref 65–99)
Potassium: 4.4 mmol/L (ref 3.5–5.3)
SODIUM: 138 mmol/L (ref 135–146)
TOTAL PROTEIN: 6.9 g/dL (ref 6.1–8.1)

## 2017-07-19 LAB — LIPID PANEL W/REFLEX DIRECT LDL
Cholesterol: 164 mg/dL (ref <200)
HDL: 37 mg/dL — AB (ref >50)
LDL-Cholesterol: 104 mg/dL — ABNORMAL HIGH
Non-HDL Cholesterol (Calc): 127 mg/dL (ref <130)
TRIGLYCERIDES: 134 mg/dL (ref <150)
Total CHOL/HDL Ratio: 4.4 Ratio (ref <5.0)

## 2017-07-19 LAB — VITAMIN B12: VITAMIN B 12: 383 pg/mL (ref 200–1100)

## 2017-07-19 NOTE — Progress Notes (Signed)
All labs are normal. 

## 2017-07-23 ENCOUNTER — Telehealth: Payer: Self-pay

## 2017-07-23 DIAGNOSIS — N200 Calculus of kidney: Secondary | ICD-10-CM

## 2017-07-23 NOTE — Telephone Encounter (Signed)
Nicole Mata was seen at [facility] over the weekend. She was diagnosed with nephrolithiasis. She was referred to [facility]. However, she works in [place] and would rather be seen in [place]. Please advise.           CT Abdomen Pelvis WO IV Contrast (Stone Protocol)07/19/2017 [facility] Result Impression  IMPRESSION:   There is mild left hydronephrosis and left hydroureter, secondary to what appears to be at least (2) 2 mm partially obstructing calculi of the distal left ureter, just proximal to the left ureterovesicular junction.  Subcentimeter nonobstructing left renal calculi are also seen.  Punctate nonobstructing calculi the right kidney.  Electronically Signed by: Zettie Pho  Result Narrative  INDICATION: Flank pain, stone disease suspected. COMPARISON:CT 11//2017. TECHNIQUE: Routine CT of the abdomen and pelvis was performed without intravenous or oral contrast per renal calculus evaluation protocol. This exam is intended to evaluate for presence of renal calculi and does not adequately assess for renal or  urothelial malignancies. Radiation dose reduction was utilized (automated exposure control, mA or kV adjustment based on patient size, or iterative image reconstruction).  FINDINGS:  VISUALIZED LOWER THORAX: No acute abnormalities.  UROLOGIC: Kidneys/ureters: There is mild left hydronephrosis and left hydroureter, secondary to what appears to be at least (2) 2 mm partially obstructing calculi of the distal left ureter, just proximal to the left ureterovesicular junction.Subcentimeter  nonobstructing left renal calculi are also seen.Punctate right kidney nonobstructing calculi. Urinary bladder: Unremarkable.  SOLID VISCERA: Liver: Fatty infiltration. No focal lesion.. Gallbladder: Gallbladder is surgically absent. Adrenal glands: Intact. Pancreas: Intact. Spleen: Intact.  GI: No bowel obstruction. Colonic diverticulosis without diverticulitis. No  evidence of acute appendicitis.   PERITONEAL CAVITY/RETROPERITONEUM: No free fluid. No pneumoperitoneum. No lymphadenopathy.  PELVIS: No acute abnormalities.  MUSCULOSKELETAL: No acute or destructive osseous processes.  MISC: N/A or as above.  Other Result Information  Acute Interface, Incoming Rad Results - 07/19/2017  1:57 AM EDT INDICATION: Flank pain, stone disease suspected. COMPARISON:  CT 11//2017.   TECHNIQUE: Routine CT of the abdomen and pelvis was performed without intravenous or oral contrast per renal calculus evaluation protocol. This exam is intended to evaluate for presence of renal calculi and does not adequately assess for renal or  urothelial malignancies. Radiation dose reduction was utilized (automated exposure control, mA or kV adjustment based on patient size, or iterative image reconstruction).  FINDINGS:  VISUALIZED LOWER THORAX: No acute abnormalities.  UROLOGIC: Kidneys/ureters: There is mild left hydronephrosis and left hydroureter, secondary to what appears to be at least (2) 2 mm partially obstructing calculi of the distal left ureter, just proximal to the left ureterovesicular junction.  Subcentimeter  nonobstructing left renal calculi are also seen.  Punctate right kidney nonobstructing calculi. Urinary bladder: Unremarkable.  SOLID VISCERA: Liver: Fatty infiltration. No focal lesion.. Gallbladder: Gallbladder is surgically absent. Adrenal glands: Intact. Pancreas: Intact. Spleen: Intact.  GI: No bowel obstruction. Colonic diverticulosis without diverticulitis. No evidence of acute appendicitis.   PERITONEAL CAVITY/RETROPERITONEUM: No free fluid. No pneumoperitoneum. No lymphadenopathy.  PELVIS: No acute abnormalities.  MUSCULOSKELETAL: No acute or destructive osseous processes.  MISC: N/A or as above.   IMPRESSION:   There is mild left hydronephrosis and left hydroureter, secondary to what appears to be at least (2) 2 mm  partially obstructing calculi of the distal left ureter, just proximal to the left ureterovesicular junction.    Subcentimeter nonobstructing left renal calculi are also seen.    Punctate nonobstructing calculi the right kidney.  Electronically Signed by:  Richard Laurin Coder  Status

## 2017-07-23 NOTE — Telephone Encounter (Signed)
Okay, no problem. Please tell her that I'm so sorry that she has kidney stones. Okay to place referral to our [facility] in [place].

## 2017-07-23 NOTE — Telephone Encounter (Signed)
Referral placed. Pt advised. CT stone study report printed to be sent with referral.

## 2017-07-26 DIAGNOSIS — N201 Calculus of ureter: Secondary | ICD-10-CM | POA: Diagnosis not present

## 2017-08-09 DIAGNOSIS — M545 Low back pain: Secondary | ICD-10-CM | POA: Diagnosis not present

## 2017-08-09 DIAGNOSIS — M6281 Muscle weakness (generalized): Secondary | ICD-10-CM | POA: Diagnosis not present

## 2017-08-09 DIAGNOSIS — R262 Difficulty in walking, not elsewhere classified: Secondary | ICD-10-CM | POA: Diagnosis not present

## 2017-08-09 DIAGNOSIS — M25552 Pain in left hip: Secondary | ICD-10-CM | POA: Diagnosis not present

## 2017-08-12 DIAGNOSIS — R3989 Other symptoms and signs involving the genitourinary system: Secondary | ICD-10-CM | POA: Diagnosis not present

## 2017-08-12 DIAGNOSIS — R350 Frequency of micturition: Secondary | ICD-10-CM | POA: Diagnosis not present

## 2017-08-14 DIAGNOSIS — M6281 Muscle weakness (generalized): Secondary | ICD-10-CM | POA: Diagnosis not present

## 2017-08-14 DIAGNOSIS — M25552 Pain in left hip: Secondary | ICD-10-CM | POA: Diagnosis not present

## 2017-08-14 DIAGNOSIS — Z87442 Personal history of urinary calculi: Secondary | ICD-10-CM | POA: Diagnosis not present

## 2017-08-14 DIAGNOSIS — R262 Difficulty in walking, not elsewhere classified: Secondary | ICD-10-CM | POA: Diagnosis not present

## 2017-08-14 DIAGNOSIS — N201 Calculus of ureter: Secondary | ICD-10-CM | POA: Diagnosis not present

## 2017-08-14 DIAGNOSIS — R8271 Bacteriuria: Secondary | ICD-10-CM | POA: Diagnosis not present

## 2017-08-14 DIAGNOSIS — M545 Low back pain: Secondary | ICD-10-CM | POA: Diagnosis not present

## 2017-08-14 LAB — BASIC METABOLIC PANEL
CREATININE: 0.3 — AB (ref 0.5–1.1)
Potassium: 4.3 (ref 3.4–5.3)
Sodium: 140 (ref 137–147)

## 2017-08-21 ENCOUNTER — Encounter: Payer: Self-pay | Admitting: Family Medicine

## 2017-10-05 ENCOUNTER — Encounter: Payer: Self-pay | Admitting: Gastroenterology

## 2017-10-18 ENCOUNTER — Ambulatory Visit (INDEPENDENT_AMBULATORY_CARE_PROVIDER_SITE_OTHER): Payer: BLUE CROSS/BLUE SHIELD | Admitting: Family Medicine

## 2017-10-18 ENCOUNTER — Other Ambulatory Visit: Payer: Self-pay | Admitting: Family Medicine

## 2017-10-18 ENCOUNTER — Encounter: Payer: Self-pay | Admitting: Family Medicine

## 2017-10-18 ENCOUNTER — Other Ambulatory Visit: Payer: Self-pay

## 2017-10-18 VITALS — BP 131/67 | HR 83 | Ht 67.0 in | Wt 225.0 lb

## 2017-10-18 DIAGNOSIS — E11649 Type 2 diabetes mellitus with hypoglycemia without coma: Secondary | ICD-10-CM

## 2017-10-18 DIAGNOSIS — Z6835 Body mass index (BMI) 35.0-35.9, adult: Secondary | ICD-10-CM

## 2017-10-18 LAB — POCT UA - MICROALBUMIN
Creatinine, POC: 300 mg/dL
Microalbumin Ur, POC: 80 mg/L

## 2017-10-18 LAB — POCT GLYCOSYLATED HEMOGLOBIN (HGB A1C): HEMOGLOBIN A1C: 7.8

## 2017-10-18 MED ORDER — INSULIN PEN NEEDLE 31G X 8 MM MISC: 99 refills | Status: DC

## 2017-10-18 MED ORDER — LIRAGLUTIDE 18 MG/3ML ~~LOC~~ SOPN: 0.6000 mg | PEN_INJECTOR | Freq: Every day | SUBCUTANEOUS | 5 refills | Status: DC

## 2017-10-18 MED ORDER — SITAGLIP PHOS-METFORMIN HCL ER 100-1000 MG PO TB24: 1.0000 | ORAL_TABLET | Freq: Every day | ORAL | 1 refills | Status: DC

## 2017-10-18 NOTE — Progress Notes (Signed)
   Subjective:    Patient ID: Nicole Mata, female    DOB: [birth year], 56 y.o.   MRN: [id number]  HPI Diabetes - no hypoglycemic events. No wounds or sores that are not healing well. No increased thirst or urination. Checking glucose at home. Taking medications as prescribed without any side effects. She reports her eye exam is up to date.   BMI 35-she is actually lost about 3 pounds since she was last here in August which is fantastic.  Review of Systems     Objective:   Physical Exam  Constitutional: She is oriented to person, place, and time. She appears well-developed and well-nourished.  HENT:  Head: Normocephalic and atraumatic.  Cardiovascular: Normal rate, regular rhythm and normal heart sounds.  Pulmonary/Chest: Effort normal and breath sounds normal.  Neurological: She is alert and oriented to person, place, and time.  Skin: Skin is warm and dry.  Psychiatric: She has a normal mood and affect. Her behavior is normal.          Assessment & Plan:  DM-uncontrolled.  Discussed adding Victoza which is a once daily injection.  Start with 0.6 mg and then increase to 1.2 mg after 2 weeks.  Will stay on that for the next 3 months and follow-up at that time.  Will call for eye report.    Due for screening colon Ca screen.   BMI 35-continue work on healthy diet and regular exercise and weight loss.  Follow-up in 3 months.  Hoping that the addition of the Victoza will be helpful as well.  Initially would like to be able to get rid of the glipizide since it does seem to contribute to weight gain.

## 2017-12-09 ENCOUNTER — Other Ambulatory Visit: Payer: Self-pay | Admitting: Family Medicine

## 2017-12-14 ENCOUNTER — Encounter: Payer: Self-pay | Admitting: Physician Assistant

## 2017-12-14 ENCOUNTER — Ambulatory Visit (INDEPENDENT_AMBULATORY_CARE_PROVIDER_SITE_OTHER): Payer: BLUE CROSS/BLUE SHIELD | Admitting: Physician Assistant

## 2017-12-14 VITALS — BP 122/65 | HR 87 | Ht 67.0 in | Wt 219.0 lb

## 2017-12-14 DIAGNOSIS — J0100 Acute maxillary sinusitis, unspecified: Secondary | ICD-10-CM | POA: Diagnosis not present

## 2017-12-14 DIAGNOSIS — R110 Nausea: Secondary | ICD-10-CM | POA: Insufficient documentation

## 2017-12-14 DIAGNOSIS — T887XXA Unspecified adverse effect of drug or medicament, initial encounter: Secondary | ICD-10-CM

## 2017-12-14 LAB — CBC WITH DIFFERENTIAL/PLATELET
BASOS PCT: 0.5 %
Basophils Absolute: 49 cells/uL (ref 0–200)
Eosinophils Absolute: 196 cells/uL (ref 15–500)
Eosinophils Relative: 2 %
HEMATOCRIT: 38.2 % (ref 35.0–45.0)
Hemoglobin: 13.2 g/dL (ref 11.7–15.5)
LYMPHS ABS: 2862 {cells}/uL (ref 850–3900)
MCH: 29.7 pg (ref 27.0–33.0)
MCHC: 34.6 g/dL (ref 32.0–36.0)
MCV: 85.8 fL (ref 80.0–100.0)
MPV: 10.4 fL (ref 7.5–12.5)
Monocytes Relative: 6 %
NEUTROS PCT: 62.3 %
Neutro Abs: 6105 cells/uL (ref 1500–7800)
Platelets: 395 10*3/uL (ref 140–400)
RBC: 4.45 10*6/uL (ref 3.80–5.10)
RDW: 12.5 % (ref 11.0–15.0)
Total Lymphocyte: 29.2 %
WBC: 9.8 10*3/uL (ref 3.8–10.8)
WBCMIX: 588 {cells}/uL (ref 200–950)

## 2017-12-14 LAB — LIPASE: Lipase: 31 U/L (ref 7–60)

## 2017-12-14 MED ORDER — DOXYCYCLINE HYCLATE 100 MG PO TABS: 100.0000 mg | ORAL_TABLET | Freq: Two times a day (BID) | ORAL | 0 refills | Status: DC

## 2017-12-14 NOTE — Patient Instructions (Signed)
Stop for 2-3 days. Then restart at .6mg  daily for 2 weeks then increase to 1.2mg  and stop.

## 2017-12-14 NOTE — Progress Notes (Signed)
Subjective:    Patient ID: Nicole Mata, female    DOB: 22-Apr-1961, 57 y.o.   MRN: [id number]  HPI  Pt is a 57 yo female who presents to the clinic with 3 weeks of sinus pressure, ear pain, cough, sinus drainage. Some days are better than others. She feels like she cannot get rid of it. Her main issues is the bilateral sinus pressure. She has tried tylenol cold/sinus severe and flonase with little improvement. Cough is mildly productive. No fever, chills, body aches.   She does report nausea over the past 2-3 days. She wonders if it could be from [place]. She was on 1.2mg  and then pharmacy ran out of it and was without it for over a week. She did not taper back up and was started on 1.2mg  dose after being off of it. She is very nauseated now. She vomited last night. She does have some upper abdominal cramping. Upon questioning she stated she had pancreatitis 30 years ago.   .. Active Ambulatory Problems    Diagnosis Date Noted  . GERD 12/17/2008  . HIATAL HERNIA 12/17/2008  . IRRITABLE BOWEL SYNDROME 12/17/2008  . HELICOBACTER PYLORI GASTRITIS, HX OF 12/17/2008  . Cervical radiculopathy at C7 01/16/2011  . Obese 02/06/2012  . Umbilical hernia 17/40/8144  . Small bowel disease 10/14/2012  . Menopausal symptom 06/04/2013  . Type II diabetes mellitus, uncontrolled ([place]) 11/25/2013  . Chondromalacia of right patellofemoral joint 12/15/2014  . History of kidney stones 12/15/2014  . Hyperlipidemia 06/18/2015  . Urinary urgency 10/04/2016  . Diabetic peripheral neuropathy ([place]) 10/04/2016  . Lumbar spondylosis 06/21/2017   Resolved Ambulatory Problems    Diagnosis Date Noted  . CHEST PAIN 05/24/2009  . RUQ PAIN 12/21/2008  . Acute maxillary sinusitis 09/14/2011  . Acute bronchitis 09/14/2011  . Insulin resistance 02/07/2012  . Ileitis 06/07/2012  . Diarrhea 06/07/2012  . Ankle fracture, right 09/10/2013  . Lateral epicondylitis of right elbow 12/15/2014   Past Medical History:   Diagnosis Date  . Ankle fracture, right 04/12/13  . Diabetes mellitus without complication ([place])   . Diverticulosis   . Gastropathy   . GERD (gastroesophageal reflux disease)   . Helicobacter pylori gastritis   . Hiatal hernia   . IBS (irritable bowel syndrome)   . Ileitis   . PONV (postoperative nausea and vomiting)       Review of Systems  All other systems reviewed and are negative.      Objective:   Physical Exam  Constitutional: She is oriented to person, place, and time. She appears well-developed and well-nourished.  HENT:  Head: Normocephalic and atraumatic.  Right Ear: External ear normal.  Left Ear: External ear normal.  Nose: Nose normal.  Mouth/Throat: Oropharynx is clear and moist. No oropharyngeal exudate.  Tenderness over maxillary sinuses to palpation.  TM's clear bilaterally.   Eyes: Conjunctivae are normal. Right eye exhibits no discharge. Left eye exhibits no discharge.  Cardiovascular: Normal rate, regular rhythm and normal heart sounds.  Pulmonary/Chest: Effort normal and breath sounds normal.  Abdominal: Soft. Bowel sounds are normal. She exhibits no distension and no mass. There is no tenderness. There is no rebound and no guarding.  Lymphadenopathy:    She has no cervical adenopathy.  Neurological: She is alert and oriented to person, place, and time.  Psychiatric: She has a normal mood and affect. Her behavior is normal.          Assessment & Plan:  Marland KitchenMarland KitchenAfton was seen  today for uri.  Diagnoses and all orders for this visit:  Acute non-recurrent maxillary sinusitis -     doxycycline (VIBRA-TABS) 100 MG tablet; Take 1 tablet (100 mg total) by mouth 2 (two) times daily. For 10 days.  Nausea -     CBC with Differential/Platelet -     Lipase  Medication side effect -     CBC with Differential/Platelet -     Lipase   Treated for sinus infection. Pt has PCN allergy. Treated with doxycycline. Symptomatic care discussed.   I suspect  nausea is from starting victoza at regular dose and not titrating back up. Discussed stop for 2-3 days and then restart at .6mg  daily then after 1-2 weeks go back up to 1.2mg  daily. Continue to take as needed anti-nausea as needed.  Will check lipase,cbc.  Pt does report hx of pancreatitis, this is contraindication for GLP-1. I will let PCP look at this. Not 100 percent confirmation she had pancreatitis. This is patient reported.

## 2017-12-17 NOTE — Progress Notes (Signed)
Call pt: WBC good. Lipase great.

## 2017-12-26 DIAGNOSIS — M25561 Pain in right knee: Secondary | ICD-10-CM | POA: Diagnosis not present

## 2017-12-26 DIAGNOSIS — M25562 Pain in left knee: Secondary | ICD-10-CM | POA: Diagnosis not present

## 2017-12-26 DIAGNOSIS — M5442 Lumbago with sciatica, left side: Secondary | ICD-10-CM | POA: Diagnosis not present

## 2018-01-01 DIAGNOSIS — M5136 Other intervertebral disc degeneration, lumbar region: Secondary | ICD-10-CM | POA: Diagnosis not present

## 2018-01-01 DIAGNOSIS — M25561 Pain in right knee: Secondary | ICD-10-CM | POA: Diagnosis not present

## 2018-01-01 DIAGNOSIS — M25562 Pain in left knee: Secondary | ICD-10-CM | POA: Diagnosis not present

## 2018-01-02 ENCOUNTER — Telehealth: Payer: Self-pay | Admitting: Family Medicine

## 2018-01-02 NOTE — Telephone Encounter (Signed)
Pt called clinic stating she is feeling "sluggish" and wants to start OTC B12 to help. Questions if this is ok with PCP. Will route.

## 2018-01-02 NOTE — Telephone Encounter (Signed)
Yes, ok to start OTC B12 or even a B cmplex. Just follow dosing instructions on the bottle.

## 2018-01-02 NOTE — Telephone Encounter (Signed)
Left VM with update.  

## 2018-01-07 NOTE — Telephone Encounter (Signed)
Pt advised. No further questions.  

## 2018-01-08 DIAGNOSIS — M25561 Pain in right knee: Secondary | ICD-10-CM | POA: Diagnosis not present

## 2018-01-08 DIAGNOSIS — M25562 Pain in left knee: Secondary | ICD-10-CM | POA: Diagnosis not present

## 2018-01-08 DIAGNOSIS — M5136 Other intervertebral disc degeneration, lumbar region: Secondary | ICD-10-CM | POA: Diagnosis not present

## 2018-01-10 DIAGNOSIS — M5136 Other intervertebral disc degeneration, lumbar region: Secondary | ICD-10-CM | POA: Diagnosis not present

## 2018-01-10 DIAGNOSIS — M25562 Pain in left knee: Secondary | ICD-10-CM | POA: Diagnosis not present

## 2018-01-10 DIAGNOSIS — M25561 Pain in right knee: Secondary | ICD-10-CM | POA: Diagnosis not present

## 2018-01-15 DIAGNOSIS — M25561 Pain in right knee: Secondary | ICD-10-CM | POA: Diagnosis not present

## 2018-01-15 DIAGNOSIS — M5136 Other intervertebral disc degeneration, lumbar region: Secondary | ICD-10-CM | POA: Diagnosis not present

## 2018-01-15 DIAGNOSIS — M25562 Pain in left knee: Secondary | ICD-10-CM | POA: Diagnosis not present

## 2018-01-17 DIAGNOSIS — M25562 Pain in left knee: Secondary | ICD-10-CM | POA: Diagnosis not present

## 2018-01-17 DIAGNOSIS — M25561 Pain in right knee: Secondary | ICD-10-CM | POA: Diagnosis not present

## 2018-01-17 DIAGNOSIS — M5136 Other intervertebral disc degeneration, lumbar region: Secondary | ICD-10-CM | POA: Diagnosis not present

## 2018-01-22 ENCOUNTER — Ambulatory Visit: Payer: BLUE CROSS/BLUE SHIELD | Admitting: Family Medicine

## 2018-01-23 ENCOUNTER — Other Ambulatory Visit: Payer: Self-pay | Admitting: Family Medicine

## 2018-01-23 DIAGNOSIS — M25561 Pain in right knee: Secondary | ICD-10-CM | POA: Diagnosis not present

## 2018-01-23 DIAGNOSIS — IMO0002 Reserved for concepts with insufficient information to code with codable children: Secondary | ICD-10-CM

## 2018-01-23 DIAGNOSIS — M25562 Pain in left knee: Secondary | ICD-10-CM | POA: Diagnosis not present

## 2018-01-23 DIAGNOSIS — E1165 Type 2 diabetes mellitus with hyperglycemia: Secondary | ICD-10-CM

## 2018-01-24 ENCOUNTER — Telehealth: Payer: Self-pay | Admitting: Family Medicine

## 2018-01-24 NOTE — Telephone Encounter (Signed)
Pt called the after hours line last night stating she received a steroid injection and now her blood sugar is 317. She called after hours line to see if she should double up on her victoza injection. When she was contacted by the nurse on duty, her sugar was already back down to 200 - she did not double her victoza dose.  Called Pt for a status update, no answer. Left VM to return clinic call.

## 2018-01-24 NOTE — Telephone Encounter (Signed)
Pt returned clinic call. She states her blood sugar was 192 when she woke up this morning. She took her victoza and it went down to 172. She has taken her oral Rx's as well. No further action needed for that.  She was on victoza in the past, and recently restarted it. When she restarted they put her on the low starting dose. Pt questions, if her sugar spikes like that again, what dose she should take. Will route to PCP for review.

## 2018-01-24 NOTE — Telephone Encounter (Signed)
Left VM with recommendation. Callback provided.

## 2018-01-24 NOTE — Telephone Encounter (Signed)
seh really needs to watch her diet. Usually a  Spike is caused by foods choices.  If happens again then have her drink 24 ounces of water and recheck  Sugar in 1 hour.  Also we can start increasing her victoza if she is doing well. After one week can bump toe the 1.2 dose and then again after one week can bump to the 1.8 mg dose.

## 2018-01-30 ENCOUNTER — Other Ambulatory Visit: Payer: Self-pay | Admitting: *Deleted

## 2018-01-30 DIAGNOSIS — E11649 Type 2 diabetes mellitus with hypoglycemia without coma: Secondary | ICD-10-CM

## 2018-01-30 MED ORDER — INSULIN PEN NEEDLE 31G X 8 MM MISC: 99 refills | Status: DC

## 2018-02-01 ENCOUNTER — Ambulatory Visit (INDEPENDENT_AMBULATORY_CARE_PROVIDER_SITE_OTHER): Payer: BLUE CROSS/BLUE SHIELD | Admitting: Physician Assistant

## 2018-02-01 ENCOUNTER — Encounter: Payer: Self-pay | Admitting: Physician Assistant

## 2018-02-01 VITALS — BP 122/75 | HR 87 | Temp 98.2°F | Wt 223.0 lb

## 2018-02-01 DIAGNOSIS — H6983 Other specified disorders of Eustachian tube, bilateral: Secondary | ICD-10-CM

## 2018-02-01 DIAGNOSIS — J0191 Acute recurrent sinusitis, unspecified: Secondary | ICD-10-CM

## 2018-02-01 MED ORDER — DOXYCYCLINE HYCLATE 100 MG PO TABS: 100.0000 mg | ORAL_TABLET | Freq: Two times a day (BID) | ORAL | 0 refills | Status: AC

## 2018-02-01 NOTE — Progress Notes (Signed)
HPI:                                                                Nicole Mata is a 57 y.o. female who presents to [facility] today for sinus problem  Sinusitis  This is a recurrent problem. The current episode started 1 to 4 weeks ago (x 2 weeks). The problem is unchanged. There has been no fever. The pain is mild. Associated symptoms include congestion and sinus pressure. Pertinent negatives include no chills, coughing, ear pain (fullness) or neck pain. Treatments tried: Tylenol sinus.      Depression screen [facility] 2/9 07/18/2017 04/17/2017 02/06/2017  Decreased Interest 0 0 0  Down, Depressed, Hopeless 0 0 0  PHQ - 2 Score 0 0 0  Altered sleeping 2 2 -  Tired, decreased energy 1 2 -  Change in appetite 1 1 -  Feeling bad or failure about yourself  0 0 -  Trouble concentrating 0 1 -  Moving slowly or fidgety/restless 0 0 -  Suicidal thoughts 0 0 -  PHQ-9 Score 4 6 -    No flowsheet data found.    Past Medical History:  Diagnosis Date  . Ankle fracture, right 04/12/13  . Diabetes mellitus without complication ([place])   . Diverticulosis   . Gastropathy    reactive  . GERD (gastroesophageal reflux disease)   . Helicobacter pylori gastritis   . Hiatal hernia   . IBS (irritable bowel syndrome)   . Ileitis   . PONV (postoperative nausea and vomiting)    Past Surgical History:  Procedure Laterality Date  . CHOLECYSTECTOMY  2001  . LAPAROSCOPY  11/19/2012   Procedure: LAPAROSCOPY DIAGNOSTIC;  Surgeon: Joyice Faster. Cornett, MD;  Location: [facility];  Service: General;  Laterality: N/A;  Diagnostic laparoscopy  . SHOULDER SURGERY  2002, 2003   bilateral for bone spurs and torn RC  . UMBILICAL HERNIA REPAIR  11/19/2012   Procedure: LAPAROSCOPIC UMBILICAL HERNIA;  Surgeon: Joyice Faster. Cornett, MD;  Location: [facility];  Service: General;  Laterality: N/A;  ventral hernia repair with mesh   Social History   Tobacco Use  . Smoking status:  Never Smoker  . Smokeless tobacco: Never Used  Substance Use Topics  . Alcohol use: No   family history includes Diabetes in her brother, maternal grandmother, and maternal uncle; Diabetes type II in her mother; Heart attack (age of onset: 64) in her father; Heart disease in her unknown relative; Hyperlipidemia in her father and mother; Hypertension in her father and mother; Ovarian cancer in her maternal aunt and maternal aunt.    ROS: negative except as noted in the HPI  Medications: Current Outpatient Medications  Medication Sig Dispense Refill  . Blood Glucose Monitoring Suppl (BAYER CONTOUR MONITOR) w/Device KIT Use to check blood sugar one time daily. Dx: E11.65. DUE FOR FOLLOW UP 1 kit 0  . DEXILANT 60 MG capsule Take 60 mg by mouth daily before breakfast.  10  . doxycycline (VIBRA-TABS) 100 MG tablet Take 1 tablet (100 mg total) by mouth 2 (two) times daily for 7 days. 14 tablet 0  . fluticasone (FLONASE) 50 MCG/ACT nasal spray Place 2 sprays into the nose daily. 16 g 2  .  glipiZIDE (GLUCOTROL) 10 MG tablet Take 1 tablet (10 mg total) by mouth 2 (two) times daily before a meal. 180 tablet 1  . glucose blood (BAYER CONTOUR TEST) test strip For checking blood sugar once daily. Dx:E11.9 100 each 11  . Insulin Pen Needle 31G X 8 MM MISC Injected into skin once daily. Dx: E11.9 100 each PRN  . ipratropium (ATROVENT) 0.03 % nasal spray Place 2 sprays into both nostrils 4 (four) times daily. 30 mL 0  . ketotifen (ZADITOR) 0.025 % ophthalmic solution Place 1 drop into both eyes every morning.    . liraglutide (VICTOZA) 18 MG/3ML SOPN Inject 0.1 mLs (0.6 mg total) daily into the skin. After 2 weeks can increase to 1.18m daily 1 pen 5  . meloxicam (MOBIC) 15 MG tablet Take 1 tablet (15 mg total) by mouth daily. 30 tablet 3  . MICROLET LANCETS MISC USE TO CHECK SUGAR ONCE DAILY 100 each 6  . pravastatin (PRAVACHOL) 40 MG tablet Take 1 tablet (40 mg total) by mouth at bedtime. 90 tablet 3  .  pregabalin (LYRICA) 25 MG capsule Take 1 capsule (25 mg total) by mouth 2 (two) times daily. 180 capsule 3  . SitaGLIPtin-MetFORMIN HCl (JANUMET XR) [phone] MG TB24 Take 1 tablet daily by mouth. 90 tablet 1   No current facility-administered medications for this visit.    Allergies  Allergen Reactions  . Penicillins Anaphylaxis  . Sulfa Drugs Cross Reactors Anaphylaxis  . Contrast Media [Iodinated Diagnostic Agents] Hives  . Atorvastatin Other (See Comments)    Muscle aches  . Gabapentin Other (See Comments)    GERD  . Januvia [Sitagliptin] Other (See Comments)    Hot flashes and flushed and dizzy  . Morphine And Related Nausea And Vomiting    Pt chooses not to take it  . Prednisone     Other reaction(s): Confusion  . Azithromycin Other (See Comments)    Other reaction(s): Other Makes her feel really crazy. Makes her feel really crazy.  .Marland KitchenHydrocodone Nausea And Vomiting       Objective:  BP 122/75   Pulse 87   Temp 98.2 F (36.8 C) (Oral)   Wt 223 lb (101.2 kg)   SpO2 96%   BMI 34.93 kg/m  Gen:  alert, not ill-appearing, no distress, appropriate for age H69 head normocephalic without obvious abnormality, conjunctiva and cornea clear, TM's clear bilaterally, nasal mucosa edematous, there is left maxillary sinus tenderness, oropharynx clear, moist mucous membranes, neck supple, no cervical adenopathy, trachea midline Pulm: Normal work of breathing, normal phonation, clear to auscultation bilaterally, no wheezes, rales or rhonchi CV: Normal rate, regular rhythm, s1 and s2 distinct, no murmurs, clicks or rubs  Neuro: alert and oriented x 3, no tremor MSK: extremities atraumatic, normal gait and station Skin: intact, no rashes on exposed skin, no jaundice, no cyanosis    No results found for this or any previous visit (from the past 72 hour(s)). No results found.    Assessment and Plan: 57y.o. female with   1. Acute recurrent sinusitis, unspecified location -  continue Flonase for eustachian tube dysfunction. 2 weeks of purulent sinus drainage and facial pressure. She has type 2 diabetes, so is immunosuppressed. Multiple antibiotic allergies, does well with Doxcycline. Consider referral to ENT if symptoms recur in 1 month - doxycycline (VIBRA-TABS) 100 MG tablet; Take 1 tablet (100 mg total) by mouth 2 (two) times daily for 7 days.  Dispense: 14 tablet; Refill: 0   Patient  education and anticipatory guidance given Patient agrees with treatment plan Follow-up as needed if symptoms worsen or fail to improve  Darlyne Russian PA-C

## 2018-02-01 NOTE — Patient Instructions (Signed)
- warm facial compresses - tylenol as needed for headache/sinus pain - netti pot or nasal saline rinses    Sinusitis, Adult Sinusitis is soreness and inflammation of your sinuses. Sinuses are hollow spaces in the bones around your face. Your sinuses are located:  Around your eyes.  In the middle of your forehead.  Behind your nose.  In your cheekbones.  Your sinuses and nasal passages are lined with a stringy fluid (mucus). Mucus normally drains out of your sinuses. When your nasal tissues become inflamed or swollen, the mucus can become trapped or blocked so air cannot flow through your sinuses. This allows bacteria, viruses, and funguses to grow, which leads to infection. Sinusitis can develop quickly and last for 7?10 days (acute) or for more than 12 weeks (chronic). Sinusitis often develops after a cold. What are the causes? This condition is caused by anything that creates swelling in the sinuses or stops mucus from draining, including:  Allergies.  Asthma.  Bacterial or viral infection.  Abnormally shaped bones between the nasal passages.  Nasal growths that contain mucus (nasal polyps).  Narrow sinus openings.  Pollutants, such as chemicals or irritants in the air.  A foreign object stuck in the nose.  A fungal infection. This is rare.  What increases the risk? The following factors may make you more likely to develop this condition:  Having allergies or asthma.  Having had a recent cold or respiratory tract infection.  Having structural deformities or blockages in your nose or sinuses.  Having a weak immune system.  Doing a lot of swimming or diving.  Overusing nasal sprays.  Smoking.  What are the signs or symptoms? The main symptoms of this condition are pain and a feeling of pressure around the affected sinuses. Other symptoms include:  Upper toothache.  Earache.  Headache.  Bad breath.  Decreased sense of smell and taste.  A cough that  may get worse at night.  Fatigue.  Fever.  Thick drainage from your nose. The drainage is often green and it may contain pus (purulent).  Stuffy nose or congestion.  Postnasal drip. This is when extra mucus collects in the throat or back of the nose.  Swelling and warmth over the affected sinuses.  Sore throat.  Sensitivity to light.  How is this diagnosed? This condition is diagnosed based on symptoms, a medical history, and a physical exam. To find out if your condition is acute or chronic, your health care provider may:  Look in your nose for signs of nasal polyps.  Tap over the affected sinus to check for signs of infection.  View the inside of your sinuses using an imaging device that has a light attached (endoscope).  If your health care provider suspects that you have chronic sinusitis, you may also:  Be tested for allergies.  Have a sample of mucus taken from your nose (nasal culture) and checked for bacteria.  Have a mucus sample examined to see if your sinusitis is related to an allergy.  If your sinusitis does not respond to treatment and it lasts longer than 8 weeks, you may have an MRI or CT scan to check your sinuses. These scans also help to determine how severe your infection is. In rare cases, a bone biopsy may be done to rule out more serious types of fungal sinus disease. How is this treated? Treatment for sinusitis depends on the cause and whether your condition is chronic or acute. If a virus is causing your  sinusitis, your symptoms will go away on their own within 10 days. You may be given medicines to relieve your symptoms, including:  Topical nasal decongestants. They shrink swollen nasal passages and let mucus drain from your sinuses.  Antihistamines. These drugs block inflammation that is triggered by allergies. This can help to ease swelling in your nose and sinuses.  Topical nasal corticosteroids. These are nasal sprays that ease inflammation  and swelling in your nose and sinuses.  Nasal saline washes. These rinses can help to get rid of thick mucus in your nose.  If your condition is caused by bacteria, you will be given an antibiotic medicine. If your condition is caused by a fungus, you will be given an antifungal medicine. Surgery may be needed to correct underlying conditions, such as narrow nasal passages. Surgery may also be needed to remove polyps. Follow these instructions at home: Medicines  Take, use, or apply over-the-counter and prescription medicines only as told by your health care provider. These may include nasal sprays.  If you were prescribed an antibiotic medicine, take it as told by your health care provider. Do not stop taking the antibiotic even if you start to feel better. Hydrate and Humidify  Drink enough water to keep your urine clear or pale yellow. Staying hydrated will help to thin your mucus.  Use a cool mist humidifier to keep the humidity level in your home above 50%.  Inhale steam for 10-15 minutes, 3-4 times a day or as told by your health care provider. You can do this in the bathroom while a hot shower is running.  Limit your exposure to cool or dry air. Rest  Rest as much as possible.  Sleep with your head raised (elevated).  Make sure to get enough sleep each night. General instructions  Apply a warm, moist washcloth to your face 3-4 times a day or as told by your health care provider. This will help with discomfort.  Wash your hands often with soap and water to reduce your exposure to viruses and other germs. If soap and water are not available, use hand sanitizer.  Do not smoke. Avoid being around people who are smoking (secondhand smoke).  Keep all follow-up visits as told by your health care provider. This is important. Contact a health care provider if:  You have a fever.  Your symptoms get worse.  Your symptoms do not improve within 10 days. Get help right away  if:  You have a severe headache.  You have persistent vomiting.  You have pain or swelling around your face or eyes.  You have vision problems.  You develop confusion.  Your neck is stiff.  You have trouble breathing. This information is not intended to replace advice given to you by your health care provider. Make sure you discuss any questions you have with your health care provider. Document Released: 11/20/2005 Document Revised: 07/16/2016 Document Reviewed: 09/15/2015 Elsevier Interactive Patient Education  [employer].

## 2018-02-11 ENCOUNTER — Ambulatory Visit (INDEPENDENT_AMBULATORY_CARE_PROVIDER_SITE_OTHER): Payer: BLUE CROSS/BLUE SHIELD | Admitting: Family Medicine

## 2018-02-11 ENCOUNTER — Encounter: Payer: Self-pay | Admitting: Family Medicine

## 2018-02-11 VITALS — BP 121/66 | HR 88 | Ht 67.0 in | Wt 222.0 lb

## 2018-02-11 DIAGNOSIS — E11649 Type 2 diabetes mellitus with hypoglycemia without coma: Secondary | ICD-10-CM

## 2018-02-11 DIAGNOSIS — Z6834 Body mass index (BMI) 34.0-34.9, adult: Secondary | ICD-10-CM | POA: Diagnosis not present

## 2018-02-11 LAB — POCT GLYCOSYLATED HEMOGLOBIN (HGB A1C): HEMOGLOBIN A1C: 6.3

## 2018-02-11 MED ORDER — MECLIZINE HCL 12.5 MG PO TABS: 12.5000 mg | ORAL_TABLET | Freq: Three times a day (TID) | ORAL | 0 refills | Status: DC | PRN

## 2018-02-11 MED ORDER — ONDANSETRON 4 MG PO TBDP: 4.0000 mg | ORAL_TABLET | Freq: Three times a day (TID) | ORAL | 0 refills | Status: DC | PRN

## 2018-02-11 NOTE — Progress Notes (Signed)
Subjective:    CC: DM  HPI: Diabetes - no hypoglycemic events. No wounds or sores that are not healing well. No increased thirst or urination. Checking glucose at home. Taking medications as prescribed without any side effects. She did have a steroid injection for her knee.    Overweight - she is doing well. She is down about 8 lbs from August.  She is not currently exercising but will be outside and in the yard more when the weather is warming. Still doing her stretches for her back that she learned in PT.    Past medical history, Surgical history, Family history not pertinant except as noted below, Social history, Allergies, and medications have been entered into the medical record, reviewed, and corrections made.   Review of Systems: No fevers, chills, night sweats, weight loss, chest pain, or shortness of breath.   Objective:    General: Well Developed, well nourished, and in no acute distress.  Neuro: Alert and oriented x3, extra-ocular muscles intact, sensation grossly intact.  HEENT: Normocephalic, atraumatic  Skin: Warm and dry, no rashes. Cardiac: Regular rate and rhythm, no murmurs rubs or gallops, no lower extremity edema.  Respiratory: Clear to auscultation bilaterally. Not using accessory muscles, speaking in full sentences.   Impression and Recommendations:    DM -well controlled.  Hemoglobin A1c down to 6.3 today which is awesome.  Significant improvement from previous A1c of 7.8.  We will continue with current regimen and see her back in 3-4 months.  She should be due for some repeat blood work for at least renal function at that time.  BMI 34 /overweight -just encouraged her to stay active and continue with her home stretches and exercises and work on more regular exercise where she gets her heart rate up.  In fact if she starts noticing any low blood sugars encouraged her to call the office and we can adjust her medication if needed.

## 2018-03-26 ENCOUNTER — Ambulatory Visit (INDEPENDENT_AMBULATORY_CARE_PROVIDER_SITE_OTHER): Payer: BLUE CROSS/BLUE SHIELD | Admitting: Family Medicine

## 2018-03-26 ENCOUNTER — Encounter: Payer: Self-pay | Admitting: Family Medicine

## 2018-03-26 VITALS — BP 135/81 | HR 107 | Temp 99.0°F | Ht 67.0 in | Wt 221.0 lb

## 2018-03-26 DIAGNOSIS — J0100 Acute maxillary sinusitis, unspecified: Secondary | ICD-10-CM | POA: Diagnosis not present

## 2018-03-26 DIAGNOSIS — M7711 Lateral epicondylitis, right elbow: Secondary | ICD-10-CM | POA: Diagnosis not present

## 2018-03-26 DIAGNOSIS — R509 Fever, unspecified: Secondary | ICD-10-CM

## 2018-03-26 LAB — POCT INFLUENZA A/B
INFLUENZA A, POC: NEGATIVE
INFLUENZA B, POC: NEGATIVE

## 2018-03-26 MED ORDER — MELOXICAM 15 MG PO TABS: 15.0000 mg | ORAL_TABLET | Freq: Every day | ORAL | 3 refills | Status: DC

## 2018-03-26 NOTE — Progress Notes (Signed)
Subjective:    Patient ID: Nicole Mata, female    DOB: 1961-02-19, 57 y.o.   MRN: [id number]  HPI 57 yo female  with fever and nasal congestion,  + facial pressure( bilat maxllary) and bilat ear pain.  Fever to 101.8 last night.  Took Tylenol Sinus last night.  sxs started yesterday afternoon.  Some mild cough.  No GI sxs.  + sick contacts.    Review of Systems  BP 135/81   Pulse (!) 107   Temp 99 F (37.2 C)   Ht '5\' 7"'  (1.702 m)   Wt 221 lb (100.2 kg)   SpO2 96%   BMI 34.61 kg/m     Allergies  Allergen Reactions  . Penicillins Anaphylaxis  . Sulfa Drugs Cross Reactors Anaphylaxis  . Contrast Media [Iodinated Diagnostic Agents] Hives  . Atorvastatin Other (See Comments)    Muscle aches  . Gabapentin Other (See Comments)    GERD  . Januvia [Sitagliptin] Other (See Comments)    Hot flashes and flushed and dizzy  . Morphine And Related Nausea And Vomiting    Pt chooses not to take it  . Prednisone     Other reaction(s): Confusion  . Azithromycin Other (See Comments)    Other reaction(s): Other Makes her feel really crazy. Makes her feel really crazy.  Marland Kitchen Hydrocodone Nausea And Vomiting    Past Medical History:  Diagnosis Date  . Ankle fracture, right 04/12/13  . Diabetes mellitus without complication ([place])   . Diverticulosis   . Gastropathy    reactive  . GERD (gastroesophageal reflux disease)   . Helicobacter pylori gastritis   . Hiatal hernia   . IBS (irritable bowel syndrome)   . Ileitis   . PONV (postoperative nausea and vomiting)     Past Surgical History:  Procedure Laterality Date  . CHOLECYSTECTOMY  2001  . LAPAROSCOPY  11/19/2012   Procedure: LAPAROSCOPY DIAGNOSTIC;  Surgeon: Joyice Faster. Cornett, MD;  Location: [facility];  Service: General;  Laterality: N/A;  Diagnostic laparoscopy  . SHOULDER SURGERY  2002, 2003   bilateral for bone spurs and torn RC  . UMBILICAL HERNIA REPAIR  11/19/2012   Procedure: LAPAROSCOPIC UMBILICAL HERNIA;  Surgeon:  Joyice Faster. Cornett, MD;  Location: [facility];  Service: General;  Laterality: N/A;  ventral hernia repair with mesh    Social History   Socioeconomic History  . Marital status: Divorced    Spouse name: Not on file  . Number of children: 0  . Years of education: Not on file  . Highest education level: Not on file  Occupational History  . Occupation: Therapist, art.   Social Needs  . Financial resource strain: Not on file  . Food insecurity:    Worry: Not on file    Inability: Not on file  . Transportation needs:    Medical: Not on file    Non-medical: Not on file  Tobacco Use  . Smoking status: Never Smoker  . Smokeless tobacco: Never Used  Substance and Sexual Activity  . Alcohol use: No  . Drug use: No  . Sexual activity: Yes    Partners: Male    Comment: divorced, no kids, customer service, 2 caffeine drinks daily.  Lifestyle  . Physical activity:    Days per week: Not on file    Minutes per session: Not on file  . Stress: Not on file  Relationships  . Social connections:    Talks on phone: Not on file  Gets together: Not on file    Attends religious service: Not on file    Active member of club or organization: Not on file    Attends meetings of clubs or organizations: Not on file    Relationship status: Not on file  . Intimate partner violence:    Fear of current or ex partner: Not on file    Emotionally abused: Not on file    Physically abused: Not on file    Forced sexual activity: Not on file  Other Topics Concern  . Not on file  Social History Narrative   1 can of soda a day.  No regular exercise.     Family History  Problem Relation Age of Onset  . Heart disease Unknown        family disease  . Heart attack Father 66       grandparents  . Hypertension Father   . Hyperlipidemia Father   . Diabetes Maternal Grandmother   . Diabetes Maternal Uncle   . Ovarian cancer Maternal Aunt        x 2  . Hypertension Mother   . Hyperlipidemia Mother   .  Diabetes Brother   . Colon cancer Neg Hx   . Ovarian cancer Maternal Aunt   . Diabetes type II Mother     Outpatient Encounter Medications as of 03/26/2018  Medication Sig  . Blood Glucose Monitoring Suppl (BAYER CONTOUR MONITOR) w/Device KIT Use to check blood sugar one time daily. Dx: E11.65. DUE FOR FOLLOW UP  . DEXILANT 60 MG capsule Take 60 mg by mouth daily before breakfast.  . fluticasone (FLONASE) 50 MCG/ACT nasal spray Place 2 sprays into the nose daily.  Marland Kitchen glipiZIDE (GLUCOTROL) 10 MG tablet Take 1 tablet (10 mg total) by mouth 2 (two) times daily before a meal.  . glucose blood (BAYER CONTOUR TEST) test strip For checking blood sugar once daily. Dx:E11.9  . Insulin Pen Needle 31G X 8 MM MISC Injected into skin once daily. Dx: E11.9  . ipratropium (ATROVENT) 0.03 % nasal spray Place 2 sprays into both nostrils 4 (four) times daily.  Marland Kitchen ketotifen (ZADITOR) 0.025 % ophthalmic solution Place 1 drop into both eyes every morning.  . liraglutide (VICTOZA) 18 MG/3ML SOPN Inject 0.1 mLs (0.6 mg total) daily into the skin. After 2 weeks can increase to 1.48m daily  . meclizine (ANTIVERT) 12.5 MG tablet Take 1 tablet (12.5 mg total) by mouth 3 (three) times daily as needed for dizziness.  . meloxicam (MOBIC) 15 MG tablet Take 1 tablet (15 mg total) by mouth daily.  .Marland KitchenMICROLET LANCETS MISC USE TO CHECK SUGAR ONCE DAILY  . pravastatin (PRAVACHOL) 40 MG tablet Take 1 tablet (40 mg total) by mouth at bedtime.  . pregabalin (LYRICA) 25 MG capsule Take 1 capsule (25 mg total) by mouth 2 (two) times daily.  . SitaGLIPtin-MetFORMIN HCl (JANUMET XR) [phone] MG TB24 Take 1 tablet daily by mouth.  . [DISCONTINUED] meloxicam (MOBIC) 15 MG tablet Take 1 tablet (15 mg total) by mouth daily.  . [DISCONTINUED] ondansetron (ZOFRAN-ODT) 4 MG disintegrating tablet Take 1 tablet (4 mg total) by mouth every 8 (eight) hours as needed for nausea or vomiting.   No facility-administered encounter medications on file  as of 03/26/2018.          Objective:   Physical Exam  Constitutional: She is oriented to person, place, and time. She appears well-developed and well-nourished.  HENT:  Head: Normocephalic and atraumatic.  Right Ear: External ear normal.  Left Ear: External ear normal.  Nose: Nose normal.  Mouth/Throat: Oropharynx is clear and moist.  TMs and canals are clear.   Eyes: Pupils are equal, round, and reactive to light. Conjunctivae and EOM are normal.  Neck: Neck supple. No thyromegaly present.  Cardiovascular: Normal rate, regular rhythm and normal heart sounds.  Pulmonary/Chest: Effort normal and breath sounds normal. She has no wheezes.  Lymphadenopathy:    She has no cervical adenopathy.  Neurological: She is alert and oriented to person, place, and time.  Skin: Skin is warm and dry.  Psychiatric: She has a normal mood and affect.       Assessment & Plan:  Acute sinusitis -flu swab was negative.  Likely viral.  Recommend stay out of work for at least 24 hours after last fever.  If not improving by the end of the week then please give [place] a call back.  Or if has fever that persists for more than 3 days and please give [place] a call.  Okay for over-the-counter symptomatic care.

## 2018-03-26 NOTE — Patient Instructions (Signed)

## 2018-04-22 ENCOUNTER — Encounter: Payer: Self-pay | Admitting: Obstetrics & Gynecology

## 2018-04-22 ENCOUNTER — Ambulatory Visit (INDEPENDENT_AMBULATORY_CARE_PROVIDER_SITE_OTHER): Payer: BLUE CROSS/BLUE SHIELD | Admitting: Obstetrics & Gynecology

## 2018-04-22 VITALS — BP 121/73 | HR 90 | Ht 67.0 in | Wt 222.0 lb

## 2018-04-22 DIAGNOSIS — Z1151 Encounter for screening for human papillomavirus (HPV): Secondary | ICD-10-CM

## 2018-04-22 DIAGNOSIS — Z124 Encounter for screening for malignant neoplasm of cervix: Secondary | ICD-10-CM | POA: Diagnosis not present

## 2018-04-22 DIAGNOSIS — Z01419 Encounter for gynecological examination (general) (routine) without abnormal findings: Secondary | ICD-10-CM | POA: Diagnosis not present

## 2018-04-22 NOTE — Progress Notes (Signed)
Subjective:    Nicole Mata is a 57 y.o. married P0 female who presents for an annual exam. The patient has no complaints today. The patient is sexually active, about 2-3 times per month. Her husband is 59 years old. GYN screening history: last pap: was normal. The patient wears seatbelts: yes. The patient participates in regular exercise: no. Has the patient ever been transfused or tattooed?: no. The patient reports that there is not domestic violence in her life.   Menstrual History: OB History    Gravida  1   Para      Term      Preterm      AB  1   Living        SAB  1   TAB      Ectopic      Multiple      Live Births              Menarche age: 28 No LMP recorded. (Menstrual status: Perimenopausal).    The following portions of the patient's history were reviewed and updated as appropriate: allergies, current medications, past family history, past medical history, past social history, past surgical history and problem list.  Review of Systems Pertinent items are noted in HPI.   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) She had a colonoscopy in about 2012   Objective:    BP 121/73   Pulse 90   Ht 5\' 7"  (1.702 m)   Wt 222 lb (100.7 kg)   BMI 34.77 kg/m   General Appearance:    Alert, cooperative, no distress, appears stated age  Head:    Normocephalic, without obvious abnormality, atraumatic  Eyes:    PERRL, conjunctiva/corneas clear, EOM's intact, fundi    benign, both eyes  Ears:    Normal TM's and external ear canals, both ears  Nose:   Nares normal, septum midline, mucosa normal, no drainage    or sinus tenderness  Throat:   Lips, mucosa, and tongue normal; teeth and gums normal  Neck:   Supple, symmetrical, trachea midline, no adenopathy;    thyroid:  no  enlargement/tenderness/nodules; no carotid   bruit or JVD  Back:     Symmetric, no curvature, ROM normal, no CVA tenderness  Lungs:     Clear to auscultation bilaterally, respirations unlabored  Chest Wall:    No tenderness or deformity   Heart:    Regular rate and rhythm, S1 and S2 normal, no murmur, rub   or gallop  Breast Exam:    No tenderness, masses, or nipple abnormality  Abdomen:     Soft, non-tender, bowel sounds active all four quadrants,    no masses, no organomegaly  Genitalia:    Normal female without lesion, discharge or tenderness     Extremities:   Extremities normal, atraumatic, no cyanosis or edema  Pulses:   2+ and symmetric all extremities  Skin:   Skin color, texture, turgor normal, no rashes or lesions  Lymph nodes:   Cervical, supraclavicular, and axillary nodes normal  Neurologic:   CNII-XII intact, normal strength, sensation and reflexes    throughout  .    Assessment:    Healthy female exam.    Plan:     Thin prep Pap smear. with cotesting Mammogram in the summer

## 2018-04-23 ENCOUNTER — Ambulatory Visit: Payer: BLUE CROSS/BLUE SHIELD | Admitting: Obstetrics & Gynecology

## 2018-04-23 ENCOUNTER — Other Ambulatory Visit: Payer: Self-pay | Admitting: Family Medicine

## 2018-04-24 LAB — CYTOLOGY - PAP
ADEQUACY: ABSENT
DIAGNOSIS: NEGATIVE
HPV (WINDOPATH): NOT DETECTED

## 2018-06-13 ENCOUNTER — Ambulatory Visit: Payer: BLUE CROSS/BLUE SHIELD | Admitting: Family Medicine

## 2018-06-20 ENCOUNTER — Encounter: Payer: Self-pay | Admitting: Family Medicine

## 2018-06-20 ENCOUNTER — Ambulatory Visit (INDEPENDENT_AMBULATORY_CARE_PROVIDER_SITE_OTHER): Payer: BLUE CROSS/BLUE SHIELD | Admitting: Family Medicine

## 2018-06-20 VITALS — BP 119/68 | HR 53 | Ht 67.0 in | Wt 223.0 lb

## 2018-06-20 DIAGNOSIS — M7062 Trochanteric bursitis, left hip: Secondary | ICD-10-CM

## 2018-06-20 DIAGNOSIS — Z1211 Encounter for screening for malignant neoplasm of colon: Secondary | ICD-10-CM

## 2018-06-20 DIAGNOSIS — G5701 Lesion of sciatic nerve, right lower limb: Secondary | ICD-10-CM | POA: Diagnosis not present

## 2018-06-20 DIAGNOSIS — E119 Type 2 diabetes mellitus without complications: Secondary | ICD-10-CM | POA: Diagnosis not present

## 2018-06-20 DIAGNOSIS — M7061 Trochanteric bursitis, right hip: Secondary | ICD-10-CM | POA: Diagnosis not present

## 2018-06-20 LAB — COMPLETE METABOLIC PANEL WITH GFR
AG Ratio: 1.3 (calc) (ref 1.0–2.5)
ALKALINE PHOSPHATASE (APISO): 78 U/L (ref 33–130)
ALT: 15 U/L (ref 6–29)
AST: 17 U/L (ref 10–35)
Albumin: 4.1 g/dL (ref 3.6–5.1)
BUN: 11 mg/dL (ref 7–25)
CALCIUM: 9.1 mg/dL (ref 8.6–10.4)
CO2: 28 mmol/L (ref 20–32)
CREATININE: 0.71 mg/dL (ref 0.50–1.05)
Chloride: 103 mmol/L (ref 98–110)
GFR, EST NON AFRICAN AMERICAN: 97 mL/min/{1.73_m2} (ref >=60)
GFR, Est African American: 112 mL/min/{1.73_m2} (ref >=60)
Globulin: 3.2 g/dL (calc) (ref 1.9–3.7)
Glucose, Bld: 121 mg/dL — ABNORMAL HIGH (ref 65–99)
POTASSIUM: 4.5 mmol/L (ref 3.5–5.3)
Sodium: 140 mmol/L (ref 135–146)
Total Bilirubin: 0.5 mg/dL (ref 0.2–1.2)
Total Protein: 7.3 g/dL (ref 6.1–8.1)

## 2018-06-20 LAB — POCT GLYCOSYLATED HEMOGLOBIN (HGB A1C): Hemoglobin A1C: 6.9 % — AB (ref 4.0–5.6)

## 2018-06-20 LAB — LIPID PANEL
CHOL/HDL RATIO: 3.7 (calc) (ref <5.0)
CHOLESTEROL: 162 mg/dL (ref <200)
HDL: 44 mg/dL — AB (ref >50)
LDL Cholesterol (Calc): 98 mg/dL (calc)
Non-HDL Cholesterol (Calc): 118 mg/dL (calc) (ref <130)
TRIGLYCERIDES: 104 mg/dL (ref <150)

## 2018-06-20 NOTE — Progress Notes (Signed)
Subjective:    CC: DM  HPI:  Diabetes - no hypoglycemic events. No wounds or sores that are not healing well. No increased thirst or urination. Checking glucose at home. Taking medications as prescribed without any side effects. On Victoza and Janumet. Reports eye exam is UTD.  She does get a little constipated on the medication but says she usually can just take a laxative will feel better.  Gets occasionally nauseated as well.  She also c/o of sinus pressure x 2 days and some nasal drainage.  No fever, chills or sweats.  Some bilat ear pressure.  No GI upset.   She also complains of bilateral leg pain.  She says it mostly hurts on the outer hips and down the outer thighs bilaterally but also in the right buttock area.  It is painful to lay on her side or her hips at night.  She is just recently started doing some exercises in the pool.  She did have physical therapy a couple of months ago.  No injury or trauma.  She sometimes feels like she gets a numbness and tingling down the right outer leg as well.   Past medical history, Surgical history, Family history not pertinant except as noted below, Social history, Allergies, and medications have been entered into the medical record, reviewed, and corrections made.   Review of Systems: No fevers, chills, night sweats, weight loss, chest pain, or shortness of breath.   Objective:    General: Well Developed, well nourished, and in no acute distress.  Neuro: Alert and oriented x3, extra-ocular muscles intact, sensation grossly intact.  HEENT: Normocephalic, atraumatic oropharynx clear, TMs and canals are clear bilaterally.  No significant cervical lymphadenopathy. Skin: Warm and dry, no rashes. Cardiac: Regular rate and rhythm, no murmurs rubs or gallops, no lower extremity edema.  Respiratory: Clear to auscultation bilaterally. Not using accessory muscles, speaking in full sentences. MSK: Normal hip flexion and extension.  Strength is 5 out of 5  at the hip knees and ankles.  He is tender bilaterally over the trochanteric bursa.   Impression and Recommendations:    DM - Well controlled, though her A1c did go up from last time.  No changes to her regimen but will continue work on diet and exercise.  Continue current regimen. Follow up in  4 months.    Bilateral trochanteric bursitis-home stretches and exercises provided.  If not improving return for trochanteric bursa injections.  Piriformis syndrome, right-provided on home stretches and exercises.  If not improving then please let me know.  Sinus pressure and congestion-viral versus bacterial.  At this point suspect likely viral.  Recommend treat with symptom Medicare if not better in 5 days and will consider treatment.  Colon cancer screening-new referral placed.

## 2018-06-21 ENCOUNTER — Telehealth: Payer: Self-pay

## 2018-06-21 MED ORDER — CEFDINIR 300 MG PO CAPS: 300.0000 mg | ORAL_CAPSULE | Freq: Two times a day (BID) | ORAL | 0 refills | Status: AC

## 2018-06-21 NOTE — Telephone Encounter (Signed)
Patient advised of antibiotic.  °

## 2018-06-21 NOTE — Telephone Encounter (Signed)
Nicole Mata states she is still having pressure in face and congestion. Denies fever, chills or sweats.

## 2018-06-21 NOTE — Telephone Encounter (Signed)
Okay, sounds like she is getting worse.  We will send over prescription for [facility] for her to fill.

## 2018-06-24 ENCOUNTER — Telehealth: Payer: Self-pay

## 2018-06-24 MED ORDER — DOXYCYCLINE HYCLATE 100 MG PO TABS: 100.0000 mg | ORAL_TABLET | Freq: Two times a day (BID) | ORAL | 0 refills | Status: DC

## 2018-06-24 NOTE — Telephone Encounter (Signed)
Pt advised.

## 2018-06-24 NOTE — Telephone Encounter (Signed)
New rx sent for doxycyline.

## 2018-06-24 NOTE — Telephone Encounter (Signed)
Nicole Mata did not pick up the [facility] because the pharmacist states that if she is allergic to PCN there is a 25 % chance she will have an allergic reaction. She still has a lot of pressure in her face. Please advise.

## 2018-07-04 ENCOUNTER — Encounter: Payer: Self-pay | Admitting: Emergency Medicine

## 2018-07-04 ENCOUNTER — Emergency Department (INDEPENDENT_AMBULATORY_CARE_PROVIDER_SITE_OTHER)
Admission: EM | Admit: 2018-07-04 | Discharge: 2018-07-04 | Disposition: A | Discharge status: Home / Self Care | Payer: BLUE CROSS/BLUE SHIELD | Source: Home / Self Care | Attending: Family Medicine | Admitting: Family Medicine

## 2018-07-04 DIAGNOSIS — M542 Cervicalgia: Secondary | ICD-10-CM | POA: Diagnosis not present

## 2018-07-04 DIAGNOSIS — R21 Rash and other nonspecific skin eruption: Secondary | ICD-10-CM

## 2018-07-04 DIAGNOSIS — M62838 Other muscle spasm: Secondary | ICD-10-CM

## 2018-07-04 MED ORDER — VALACYCLOVIR HCL 1 G PO TABS: 1000.0000 mg | ORAL_TABLET | Freq: Three times a day (TID) | ORAL | 0 refills | Status: DC

## 2018-07-04 MED ORDER — TRAMADOL HCL 50 MG PO TABS: 50.0000 mg | ORAL_TABLET | Freq: Four times a day (QID) | ORAL | 0 refills | Status: DC | PRN

## 2018-07-04 MED ORDER — METHOCARBAMOL 500 MG PO TABS: 500.0000 mg | ORAL_TABLET | Freq: Two times a day (BID) | ORAL | 0 refills | Status: DC

## 2018-07-04 NOTE — ED Triage Notes (Signed)
Pt c/o neck and shoulder pain only on left side x2 days. States she also noticed a small bump/rash starting today.

## 2018-07-04 NOTE — ED Provider Notes (Signed)
[facility]    CSN: [id number] Arrival date & time: 07/04/18  1814     History   Chief Complaint Chief Complaint  Patient presents with  . Neck Pain    HPI Nicole Mata is a 57 y.o. female.   HPI  Nicole Mata is a 57 y.o. female presenting to UC with c/o Left sided neck and shoulder pain that started 2 days ago. Aching and sore but occasional sharp pain. Pain is worse with certain head movements. 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 She has strained muscles in her neck and shoulder before. 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 The rash is itchy and burning at times. Denies fever or chills. No specific known injury. She did have chicken pox when she was younger.    Past Medical History:  Diagnosis Date  . Ankle fracture, right 04/12/13  . Diabetes mellitus without complication ([place])   . Diverticulosis   . Gastropathy    reactive  . GERD (gastroesophageal reflux disease)   . Helicobacter pylori gastritis   . Hiatal hernia   . IBS (irritable bowel syndrome)   . Ileitis   . PONV (postoperative nausea and vomiting)     Patient Active Problem List   Diagnosis Date Noted  . Eustachian tube dysfunction, bilateral 02/01/2018  . Lumbar spondylosis 06/21/2017  . Urinary urgency 10/04/2016  . Diabetic peripheral neuropathy ([place]) 10/04/2016  . Hyperlipidemia 06/18/2015  . Chondromalacia of right patellofemoral joint 12/15/2014  . History of kidney stones 12/15/2014  . Type II diabetes mellitus, uncontrolled ([place]) 11/25/2013  . Menopausal symptom 06/04/2013  . Umbilical hernia 93/57/0177  . Small bowel disease 10/14/2012  . Obese 02/06/2012  . Cervical radiculopathy at C7 01/16/2011  . GERD 12/17/2008  . HIATAL HERNIA 12/17/2008  . IRRITABLE BOWEL SYNDROME 12/17/2008  . HELICOBACTER PYLORI GASTRITIS, HX OF 12/17/2008    Past Surgical History:  Procedure Laterality Date  .  CHOLECYSTECTOMY  2001  . LAPAROSCOPY  11/19/2012   Procedure: LAPAROSCOPY DIAGNOSTIC;  Surgeon: Joyice Faster. Cornett, MD;  Location: [facility];  Service: General;  Laterality: N/A;  Diagnostic laparoscopy  . SHOULDER SURGERY  2002, 2003   bilateral for bone spurs and torn RC  . UMBILICAL HERNIA REPAIR  11/19/2012   Procedure: LAPAROSCOPIC UMBILICAL HERNIA;  Surgeon: Joyice Faster. Cornett, MD;  Location: [facility];  Service: General;  Laterality: N/A;  ventral hernia repair with mesh    OB History    Gravida  1   Para      Term      Preterm      AB  1   Living        SAB  1   TAB      Ectopic      Multiple      Live Births               Home Medications    Prior to Admission medications   Medication Sig Start Date End Date Taking? Authorizing Provider  Blood Glucose Monitoring Suppl (BAYER CONTOUR MONITOR) w/Device KIT Use to check blood sugar one time daily. Dx: E11.65. DUE FOR FOLLOW UP 06/16/16   Hali Marry, MD  DEXILANT 60 MG capsule Take 60 mg by mouth daily before breakfast. 01/07/18   [provider]  doxycycline (VIBRA-TABS) 100 MG tablet Take 1 tablet (100 mg total) by  mouth 2 (two) times daily. 06/24/18   Hali Marry, MD  fluticasone (FLONASE) 50 MCG/ACT nasal spray Place 2 sprays into the nose daily. 09/10/13   Breeback, Jade L, PA-C  glipiZIDE (GLUCOTROL) 10 MG tablet Take 1 tablet (10 mg total) by mouth 2 (two) times daily before a meal. 07/18/17   Hali Marry, MD  glucose blood (BAYER CONTOUR TEST) test strip For checking blood sugar once daily. Dx:E11.9 01/23/18   Hali Marry, MD  Insulin Pen Needle 31G X 8 MM MISC Injected into skin once daily. Dx: E11.9 01/30/18   Hali Marry, MD  ipratropium (ATROVENT) 0.03 % nasal spray Place 2 sprays into both nostrils 4 (four) times daily. 01/04/17   Emeterio Reeve, DO  JANUMET XR [phone] MG TB24 TAKE 1 TABLET BY MOUTH DAILY 04/23/18   Hali Marry, MD  ketotifen  (ZADITOR) 0.025 % ophthalmic solution Place 1 drop into both eyes every morning.    [provider]  liraglutide (VICTOZA) 18 MG/3ML SOPN Inject 0.1 mLs (0.6 mg total) daily into the skin. After 2 weeks can increase to 1.71m daily 10/18/17   MHali Marry MD  meclizine (ANTIVERT) 12.5 MG tablet Take 1 tablet (12.5 mg total) by mouth 3 (three) times daily as needed for dizziness. 02/11/18   MHali Marry MD  meloxicam (MOBIC) 15 MG tablet Take 1 tablet (15 mg total) by mouth daily. 03/26/18   MHali Marry MD  methocarbamol (ROBAXIN) 500 MG tablet Take 1 tablet (500 mg total) by mouth 2 (two) times daily. 07/04/18   PNoe Gens PA-C  MICROLET LANCETS MISC USE TO CHECK SUGAR ONCE DAILY 12/10/17   MHali Marry MD  pravastatin (PRAVACHOL) 40 MG tablet Take 1 tablet (40 mg total) by mouth at bedtime. 04/17/17   MHali Marry MD  pregabalin (LYRICA) 25 MG capsule Take 1 capsule (25 mg total) by mouth 2 (two) times daily. 04/17/17   MHali Marry MD  traMADol (ULTRAM) 50 MG tablet Take 1 tablet (50 mg total) by mouth every 6 (six) hours as needed. 07/04/18   PNoe Gens PA-C  valACYclovir (VALTREX) 1000 MG tablet Take 1 tablet (1,000 mg total) by mouth 3 (three) times daily. 07/04/18   PNoe Gens PA-C    Family History Family History  Problem Relation Age of Onset  . Heart disease Unknown        family disease  . Heart attack Father 551      grandparents  . Hypertension Father   . Hyperlipidemia Father   . Diabetes Maternal Grandmother   . Diabetes Maternal Uncle   . Ovarian cancer Maternal Aunt        x 2  . Hypertension Mother   . Hyperlipidemia Mother   . Diabetes type II Mother   . Diabetes Brother   . Ovarian cancer Maternal Aunt   . Colon cancer Neg Hx     Social History Social History   Tobacco Use  . Smoking status: Never Smoker  . Smokeless tobacco: Never Used  Substance Use Topics  . Alcohol use: No  . Drug  use: No     Allergies   Penicillins; Sulfa drugs cross reactors; Contrast media [iodinated diagnostic agents]; Atorvastatin; Gabapentin; Januvia [sitagliptin]; Morphine and related; Prednisone; Azithromycin; and Hydrocodone   Review of Systems Review of Systems  Constitutional: Negative for chills and fever.  Musculoskeletal: Positive for back pain, myalgias, neck pain and neck stiffness.  Negative for arthralgias.  Skin: Positive for rash. Negative for wound.  Neurological: Negative for weakness and numbness.     Physical Exam Triage Vital Signs ED Triage Vitals  Enc Vitals Group     BP 07/04/18 1842 136/84     Pulse Rate 07/04/18 1842 93     Resp --      Temp 07/04/18 1842 98.1 F (36.7 C)     Temp Source 07/04/18 1842 Oral     SpO2 07/04/18 1842 98 %     Weight 07/04/18 1845 225 lb (102.1 kg)     Height --      Head Circumference --      Peak Flow --      Pain Score 07/04/18 1842 0     Pain Loc --      Pain Edu? --      Excl. in [place]? --    No data found.  Updated Vital Signs BP 136/84 (BP Location: Right Arm)   Pulse 93   Temp 98.1 F (36.7 C) (Oral)   Wt 225 lb (102.1 kg)   SpO2 98%   BMI 35.24 kg/m   Visual Acuity Right Eye Distance:   Left Eye Distance:   Bilateral Distance:    Right Eye Near:   Left Eye Near:    Bilateral Near:     Physical Exam  Constitutional: She is oriented to person, place, and time. She appears well-developed and well-nourished. No distress.  HENT:  Head: Normocephalic and atraumatic.  Eyes: EOM are normal.  Neck: Normal range of motion. Neck supple.  No midline spinal tenderness. Tenderness to Left cervical muscles. Full ROM  Cardiovascular: Normal rate.  Pulmonary/Chest: Effort normal.  Musculoskeletal: Normal range of motion. She exhibits tenderness. She exhibits no edema.  No bony tenderness. Tenderness to Left upper trapezius and proximal deltoid. Full ROM Left arm.   Neurological: She is alert and oriented to  person, place, and time.  Skin: Skin is warm and dry. Rash noted. She is not diaphoretic.     Left shoulder: 1cm area of erythematous maculopapular rash. Minimally tender "irritating". No bleeding or discharge.   Psychiatric: She has a normal mood and affect. Her behavior is normal.  Nursing note and vitals reviewed.    UC Treatments / Results  Labs (all labs ordered are listed, but only abnormal results are displayed) Labs Reviewed - No data to display  EKG None  Radiology No results found.  Procedures Procedures (including critical care time)  Medications Ordered in UC Medications - No data to display  Initial Impression / Assessment and Plan / UC Course  I have reviewed the triage vital signs and the nursing notes.  Pertinent labs & imaging results that were available during my care of the patient were reviewed by me and considered in my medical decision making (see chart for details).     Muscle strain vs early shingles. Will tx pain and start pt on Valtrex. Discussed potential for rash to worsen before improving.  Final Clinical Impressions(s) / UC Diagnoses   Final diagnoses:  Neck pain on left side  Rash and nonspecific skin eruption  Muscle spasms of neck     Discharge Instructions      You may continue to take your Mobic (meloxicam) as prescribed for pain and inflammation.  Tramadol is strong pain medication. While taking, do not drink alcohol, drive, or perform any other activities that requires focus while taking these medications.   Robaxin (methocarbamol)  is a muscle relaxer and may cause drowsiness. Do not drink alcohol, drive, or operate heavy machinery while taking.  Please take the valacyclovir (Valtrex) as prescribed as your symptoms appear to be early signs of shingles.   Please follow up with Dr. Madilyn Fireman next week for recheck of symptoms.     ED Prescriptions    Medication Sig Dispense Auth. Provider   traMADol (ULTRAM) 50 MG tablet  Take 1 tablet (50 mg total) by mouth every 6 (six) hours as needed. 15 tablet Gerarda Fraction, Erionna Strum O, PA-C   valACYclovir (VALTREX) 1000 MG tablet Take 1 tablet (1,000 mg total) by mouth 3 (three) times daily. 21 tablet Gerarda Fraction, Dathan Attia O, PA-C   methocarbamol (ROBAXIN) 500 MG tablet Take 1 tablet (500 mg total) by mouth 2 (two) times daily. 20 tablet Noe Gens, PA-C     Controlled Substance Prescriptions [place] Controlled Substance Registry consulted? Yes, I have consulted the  Controlled Substances Registry for this patient, and feel the risk/benefit ratio today is favorable for proceeding with this prescription for a controlled substance.   Noe Gens, [place] 07/05/18 [phone]

## 2018-07-04 NOTE — Discharge Instructions (Signed)
°  You may continue to take your Mobic (meloxicam) as prescribed for pain and inflammation.  Tramadol is strong pain medication. While taking, do not drink alcohol, drive, or perform any other activities that requires focus while taking these medications.   Robaxin (methocarbamol) is a muscle relaxer and may cause drowsiness. Do not drink alcohol, drive, or operate heavy machinery while taking.  Please take the valacyclovir (Valtrex) as prescribed as your symptoms appear to be early signs of shingles.   Please follow up with Dr. Madilyn Fireman next week for recheck of symptoms.

## 2018-07-05 ENCOUNTER — Telehealth: Payer: Self-pay

## 2018-07-05 NOTE — Telephone Encounter (Signed)
Spoke with Nicole Mata. She is willing to try gabapentin again, she doesn't remember ever taking it despite the intolerance on her allergy list. She refuses to take prednisone, it causes her blood sugars to rise.   Nicole Mata has not taken any lyrica or mobic today. Should she be taking this?  Would like Rx sent to [facility] on [address]. Routing.

## 2018-07-05 NOTE — Telephone Encounter (Signed)
Lyrica would be just as good as the gabapentin, if she has that she should just take it. Mobic is also on her medication list, anti-inflammatories could certainly help the pain probably more than opiates would.

## 2018-07-05 NOTE — Telephone Encounter (Signed)
Patient advised of recommendations.  

## 2018-07-05 NOTE — Telephone Encounter (Signed)
Please call patient back: I saw where urgent care gave her tramadol.  Unfortunately, tramadol and other opiate medications did not tend to be very helpful for shingles pain.  Alternatively, we could try prednisone or gabapentin but I saw that she was intolerant to these medications in the past.  Please confirm whether she is also taking her Mobic and Lyrica. She can add Tylenol up to 1000 mg 4 times per day.

## 2018-07-05 NOTE — Telephone Encounter (Signed)
Nicole Mata called and left a message stating she was diagnosed in UC last night with Shingles. She states the pain medication is not helping. Please advise.

## 2018-07-08 ENCOUNTER — Ambulatory Visit (INDEPENDENT_AMBULATORY_CARE_PROVIDER_SITE_OTHER): Payer: BLUE CROSS/BLUE SHIELD | Admitting: Family Medicine

## 2018-07-08 ENCOUNTER — Encounter: Payer: Self-pay | Admitting: Family Medicine

## 2018-07-08 VITALS — BP 136/75 | HR 92 | Ht 67.0 in | Wt 220.0 lb

## 2018-07-08 DIAGNOSIS — B029 Zoster without complications: Secondary | ICD-10-CM

## 2018-07-08 NOTE — Progress Notes (Signed)
Subjective:    Patient ID: Nicole Mata, female    DOB: 05/23/1961, 57 y.o.   MRN: [id number]  HPI  57 year old female comes in today to follow-up for shingles.  She initially started complaining of left-sided neck pain and actually went to her chiropractor to have an adjustment done.  But then noticed a bump or blister on the left shoulder and then started to notice additional bumps and blisters.  Then went to urgent care on August 1, proximally 4 days ago started on Lyrica so they told her to continue that.  They gave her tramadol which she says is not helping and started her on valacyclovir.  Regularly, she is been having a lot of pain in that left ear and wants me to check the inside just to make sure she does not have blisters internally  Review of Systems  BP 136/75   Pulse 92   Ht '5\' 7"'  (1.702 m)   Wt 220 lb (99.8 kg)   SpO2 100%   BMI 34.46 kg/m     Allergies  Allergen Reactions  . Penicillins Anaphylaxis  . Sulfa Drugs Cross Reactors Anaphylaxis  . Contrast Media [Iodinated Diagnostic Agents] Hives  . Atorvastatin Other (See Comments)    Muscle aches  . Gabapentin Other (See Comments)    GERD  . Januvia [Sitagliptin] Other (See Comments)    Hot flashes and flushed and dizzy  . Morphine And Related Nausea And Vomiting    Pt chooses not to take it  . Prednisone     Other reaction(s): Confusion  . Azithromycin Other (See Comments)    Other reaction(s): Other Makes her feel really crazy. Makes her feel really crazy.  Marland Kitchen Hydrocodone Nausea And Vomiting    Past Medical History:  Diagnosis Date  . Ankle fracture, right 04/12/13  . Diabetes mellitus without complication ([place])   . Diverticulosis   . Gastropathy    reactive  . GERD (gastroesophageal reflux disease)   . Helicobacter pylori gastritis   . Hiatal hernia   . IBS (irritable bowel syndrome)   . Ileitis   . PONV (postoperative nausea and vomiting)     Past Surgical History:  Procedure Laterality Date   . CHOLECYSTECTOMY  2001  . LAPAROSCOPY  11/19/2012   Procedure: LAPAROSCOPY DIAGNOSTIC;  Surgeon: Joyice Faster. Cornett, MD;  Location: [facility];  Service: General;  Laterality: N/A;  Diagnostic laparoscopy  . SHOULDER SURGERY  2002, 2003   bilateral for bone spurs and torn RC  . UMBILICAL HERNIA REPAIR  11/19/2012   Procedure: LAPAROSCOPIC UMBILICAL HERNIA;  Surgeon: Joyice Faster. Cornett, MD;  Location: [facility];  Service: General;  Laterality: N/A;  ventral hernia repair with mesh    Social History   Socioeconomic History  . Marital status: Married    Spouse name: Not on file  . Number of children: 0  . Years of education: Not on file  . Highest education level: Not on file  Occupational History  . Occupation: Therapist, art.   Social Needs  . Financial resource strain: Not on file  . Food insecurity:    Worry: Not on file    Inability: Not on file  . Transportation needs:    Medical: Not on file    Non-medical: Not on file  Tobacco Use  . Smoking status: Never Smoker  . Smokeless tobacco: Never Used  Substance and Sexual Activity  . Alcohol use: No  . Drug use: No  . Sexual activity: Yes  Partners: Male    Comment: divorced, no kids, customer service, 2 caffeine drinks daily.  Lifestyle  . Physical activity:    Days per week: Not on file    Minutes per session: Not on file  . Stress: Not on file  Relationships  . Social connections:    Talks on phone: Not on file    Gets together: Not on file    Attends religious service: Not on file    Active member of club or organization: Not on file    Attends meetings of clubs or organizations: Not on file    Relationship status: Not on file  . Intimate partner violence:    Fear of current or ex partner: Not on file    Emotionally abused: Not on file    Physically abused: Not on file    Forced sexual activity: Not on file  Other Topics Concern  . Not on file  Social History Narrative   1 can of soda a day.  No regular  exercise.     Family History  Problem Relation Age of Onset  . Heart disease Unknown        family disease  . Heart attack Father 70       grandparents  . Hypertension Father   . Hyperlipidemia Father   . Diabetes Maternal Grandmother   . Diabetes Maternal Uncle   . Ovarian cancer Maternal Aunt        x 2  . Hypertension Mother   . Hyperlipidemia Mother   . Diabetes type II Mother   . Diabetes Brother   . Ovarian cancer Maternal Aunt   . Colon cancer Neg Hx     Outpatient Encounter Medications as of 07/08/2018  Medication Sig  . Blood Glucose Monitoring Suppl (BAYER CONTOUR MONITOR) w/Device KIT Use to check blood sugar one time daily. Dx: E11.65. DUE FOR FOLLOW UP  . DEXILANT 60 MG capsule Take 60 mg by mouth daily before breakfast.  . fluticasone (FLONASE) 50 MCG/ACT nasal spray Place 2 sprays into the nose daily.  Marland Kitchen glipiZIDE (GLUCOTROL) 10 MG tablet Take 1 tablet (10 mg total) by mouth 2 (two) times daily before a meal.  . glucose blood (BAYER CONTOUR TEST) test strip For checking blood sugar once daily. Dx:E11.9  . Insulin Pen Needle 31G X 8 MM MISC Injected into skin once daily. Dx: E11.9  . ipratropium (ATROVENT) 0.03 % nasal spray Place 2 sprays into both nostrils 4 (four) times daily.  Marland Kitchen JANUMET XR [id number] MG TB24 TAKE 1 TABLET BY MOUTH DAILY  . ketotifen (ZADITOR) 0.025 % ophthalmic solution Place 1 drop into both eyes every morning.  . liraglutide (VICTOZA) 18 MG/3ML SOPN Inject 0.1 mLs (0.6 mg total) daily into the skin. After 2 weeks can increase to 1.78m daily  . meclizine (ANTIVERT) 12.5 MG tablet Take 1 tablet (12.5 mg total) by mouth 3 (three) times daily as needed for dizziness.  . meloxicam (MOBIC) 15 MG tablet Take 1 tablet (15 mg total) by mouth daily.  . methocarbamol (ROBAXIN) 500 MG tablet Take 1 tablet (500 mg total) by mouth 2 (two) times daily.  .Marland KitchenMICROLET LANCETS MISC USE TO CHECK SUGAR ONCE DAILY  . pravastatin (PRAVACHOL) 40 MG tablet Take 1 tablet  (40 mg total) by mouth at bedtime.  . pregabalin (LYRICA) 25 MG capsule Take 1 capsule (25 mg total) by mouth 2 (two) times daily.  . traMADol (ULTRAM) 50 MG tablet Take 1 tablet (50 mg  total) by mouth every 6 (six) hours as needed.  . valACYclovir (VALTREX) 1000 MG tablet Take 1 tablet (1,000 mg total) by mouth 3 (three) times daily.  . [DISCONTINUED] doxycycline (VIBRA-TABS) 100 MG tablet Take 1 tablet (100 mg total) by mouth 2 (two) times daily.   No facility-administered encounter medications on file as of 07/08/2018.          Objective:   Physical Exam  Constitutional: She is oriented to person, place, and time. She appears well-developed and well-nourished.  HENT:  Head: Normocephalic and atraumatic.  No lesions in her left ear canal or on the tympanic membrane.  Eyes: Conjunctivae and EOM are normal.  Cardiovascular: Normal rate.  Pulmonary/Chest: Effort normal.  Neurological: She is alert and oriented to person, place, and time.  Skin: Skin is dry. No pallor.  She has fine erythematous papules/blisters on the posterior neck, left anterior and lateral shoulder  Psychiatric: She has a normal mood and affect. Her behavior is normal.  Vitals reviewed.         Assessment & Plan:  Shingles-continue with valacyclovir.  The muscle relaxer seems to be helping as well.  Tramadol was ineffective so she has stopped that but continues to take Lyrica.  Recommend a trial of adding topical lidocaine gel and if still not helping could consider an oral narcotic.  Try to keep the area covered for the next 2 weeks especially if it starts to weep.  Did warn about being around patients who are pregnant.

## 2018-07-08 NOTE — Patient Instructions (Signed)
Can try the lidocaine gel.

## 2018-07-22 ENCOUNTER — Encounter: Payer: Self-pay | Admitting: Family Medicine

## 2018-08-09 ENCOUNTER — Other Ambulatory Visit: Payer: Self-pay | Admitting: Family Medicine

## 2018-09-16 ENCOUNTER — Other Ambulatory Visit: Payer: Self-pay | Admitting: Family Medicine

## 2018-09-16 DIAGNOSIS — Z1239 Encounter for other screening for malignant neoplasm of breast: Secondary | ICD-10-CM

## 2018-09-18 ENCOUNTER — Ambulatory Visit (INDEPENDENT_AMBULATORY_CARE_PROVIDER_SITE_OTHER): Payer: BLUE CROSS/BLUE SHIELD | Admitting: Family Medicine

## 2018-09-18 ENCOUNTER — Encounter: Payer: Self-pay | Admitting: Family Medicine

## 2018-09-18 VITALS — BP 138/68 | HR 84 | Ht 67.0 in | Wt 222.0 lb

## 2018-09-18 DIAGNOSIS — K2100 Gastro-esophageal reflux disease with esophagitis, without bleeding: Secondary | ICD-10-CM

## 2018-09-18 DIAGNOSIS — E119 Type 2 diabetes mellitus without complications: Secondary | ICD-10-CM

## 2018-09-18 DIAGNOSIS — Z23 Encounter for immunization: Secondary | ICD-10-CM | POA: Diagnosis not present

## 2018-09-18 LAB — POCT GLYCOSYLATED HEMOGLOBIN (HGB A1C): HEMOGLOBIN A1C: 7.5 % — AB (ref 4.0–5.6)

## 2018-09-18 MED ORDER — METHOCARBAMOL 500 MG PO TABS: 500.0000 mg | ORAL_TABLET | Freq: Two times a day (BID) | ORAL | 1 refills | Status: DC

## 2018-09-18 MED ORDER — DEXILANT 60 MG PO CPDR: 60.0000 mg | DELAYED_RELEASE_CAPSULE | Freq: Every day | ORAL | 10 refills | Status: DC
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
## 2018-09-19 ENCOUNTER — Ambulatory Visit (INDEPENDENT_AMBULATORY_CARE_PROVIDER_SITE_OTHER): Payer: BLUE CROSS/BLUE SHIELD

## 2018-09-19 DIAGNOSIS — Z1231 Encounter for screening mammogram for malignant neoplasm of breast: Secondary | ICD-10-CM | POA: Diagnosis not present

## 2018-09-19 DIAGNOSIS — Z1239 Encounter for other screening for malignant neoplasm of breast: Secondary | ICD-10-CM

## 2018-12-12 ENCOUNTER — Telehealth: Payer: Self-pay | Admitting: Internal Medicine

## 2018-12-12 NOTE — Telephone Encounter (Signed)
Happy to see her as a patient.  Thank you

## 2018-12-12 NOTE — Telephone Encounter (Signed)
Left message to cb to schedule recall colon.

## 2018-12-12 NOTE — Telephone Encounter (Signed)
Hi Dr. Henrene Pastor, this pt was referred to [place] for a repeat colon, she used to see Dr. Olevia Perches and is requesting to continue her gi care with you, she stated that two of her friends are patients of yours and they have spoken highly about you. Would you take her as a patient? Thank you.

## 2018-12-13 ENCOUNTER — Encounter: Payer: Self-pay | Admitting: Internal Medicine

## 2018-12-18 ENCOUNTER — Ambulatory Visit (INDEPENDENT_AMBULATORY_CARE_PROVIDER_SITE_OTHER): Payer: BLUE CROSS/BLUE SHIELD | Admitting: Family Medicine

## 2018-12-18 ENCOUNTER — Encounter: Payer: Self-pay | Admitting: Family Medicine

## 2018-12-18 VITALS — BP 131/71 | HR 84 | Ht 67.0 in | Wt 219.0 lb

## 2018-12-18 DIAGNOSIS — E119 Type 2 diabetes mellitus without complications: Secondary | ICD-10-CM

## 2018-12-18 DIAGNOSIS — K2100 Gastro-esophageal reflux disease with esophagitis, without bleeding: Secondary | ICD-10-CM

## 2018-12-18 DIAGNOSIS — E1142 Type 2 diabetes mellitus with diabetic polyneuropathy: Secondary | ICD-10-CM

## 2018-12-18 LAB — POCT GLYCOSYLATED HEMOGLOBIN (HGB A1C): Hemoglobin A1C: 7.9 % — AB (ref 4.0–5.6)

## 2018-12-18 LAB — BASIC METABOLIC PANEL WITH GFR
BUN: 9 mg/dL (ref 7–25)
CALCIUM: 9.8 mg/dL (ref 8.6–10.4)
CHLORIDE: 102 mmol/L (ref 98–110)
CO2: 32 mmol/L (ref 20–32)
Creat: 0.78 mg/dL (ref 0.50–1.05)
GFR, EST NON AFRICAN AMERICAN: 86 mL/min/{1.73_m2} (ref >=60)
GFR, Est African American: 99 mL/min/{1.73_m2} (ref >=60)
GLUCOSE: 102 mg/dL — AB (ref 65–99)
POTASSIUM: 4.4 mmol/L (ref 3.5–5.3)
SODIUM: 139 mmol/L (ref 135–146)

## 2018-12-18 LAB — POCT UA - MICROALBUMIN
CREATININE, POC: 200 mg/dL
MICROALBUMIN (UR) POC: 30 mg/L

## 2018-12-18 MED ORDER — CANAGLIFLOZIN 300 MG PO TABS: 300.0000 mg | ORAL_TABLET | Freq: Every day | ORAL | 2 refills | Status: DC

## 2018-12-18 NOTE — Patient Instructions (Signed)
Stop the glipizide and start Invokana in its place. Keep all other meds the same.

## 2018-12-18 NOTE — Progress Notes (Signed)
Subjective:    CC: DM  HPI:  Diabetes - no hypoglycemic events. No wounds or sores that are not healing well. No increased thirst or urination. Checking glucose at home. Taking medications as prescribed without any side effects.  Janumet, glipizide and Victoza.  F/U GERD - currently on Dexilant.  She has had hiatal hernia repair years ago and has been having some pain. She has appt with Dr. Henrene Pastor, GI at the end of the month.    Follow-up diabetic peripheral neuropathy-she is currently on Lyrica and takes 25 mg twice a day.  She wants me to check her ears. She has had some sinus pressure. No fever.    Past medical history, Surgical history, Family history not pertinant except as noted below, Social history, Allergies, and medications have been entered into the medical record, reviewed, and corrections made.   Review of Systems: No fevers, chills, night sweats, weight loss, chest pain, or shortness of breath.   Objective:    General: Well Developed, well nourished, and in no acute distress.  Neuro: Alert and oriented x3, extra-ocular muscles intact, sensation grossly intact.  HEENT: Normocephalic, atraumatic, TMs and canals are clear bilaterally.  But she does have a widened react light reflex on both tympanic membranes.  Skin: Warm and dry, no rashes. Cardiac: Regular rate and rhythm, no murmurs rubs or gallops, no lower extremity edema.  Respiratory: Clear to auscultation bilaterally. Not using accessory muscles, speaking in full sentences.   Impression and Recommendations:    DM -controlled.  Hemoglobin A1c up to 7.9 today.  We discussed discontinuing glipizide since he is she is already on Januvia and switching to 1 of the flow since.  It looks like Anastasio Auerbach is covered on her insurance plan and Wilder Glade and Vania Rea are not.  We will send her prescription to the pharmacy and hopefully its covered.  She is still on Victoza and says she still gets a little bit of nausea with it but  otherwise has been tolerating it well.  She does feel like it helps curb her appetite.  Is down 3 pounds in the last time I saw her.  GERD -continue with Dexilant.  We also discussed may be eating 3 small meals a day with 2 small snacks instead of 3 larger meals a day to help with her fullness symptoms that she is getting.  Also encouraged her to really back off on any carbonated beverages.  Diabetic peripheral neuropathy-pain with Lyrica.  No recent changes.  Bilateral ear pain/eustachian tube dysfunction - recommend restart her nasal steroid spray.

## 2018-12-19 NOTE — Progress Notes (Signed)
All labs are normal. 

## 2018-12-20 ENCOUNTER — Emergency Department (INDEPENDENT_AMBULATORY_CARE_PROVIDER_SITE_OTHER)
Admission: EM | Admit: 2018-12-20 | Discharge: 2018-12-20 | Disposition: A | Discharge status: Home / Self Care | Payer: BLUE CROSS/BLUE SHIELD | Source: Home / Self Care

## 2018-12-20 ENCOUNTER — Other Ambulatory Visit: Payer: Self-pay

## 2018-12-20 ENCOUNTER — Telehealth: Payer: Self-pay

## 2018-12-20 ENCOUNTER — Encounter: Payer: Self-pay | Admitting: Emergency Medicine

## 2018-12-20 DIAGNOSIS — T7840XA Allergy, unspecified, initial encounter: Secondary | ICD-10-CM | POA: Diagnosis not present

## 2018-12-20 DIAGNOSIS — R21 Rash and other nonspecific skin eruption: Secondary | ICD-10-CM

## 2018-12-20 MED ORDER — LEVOCETIRIZINE DIHYDROCHLORIDE 5 MG PO TABS: 5.0000 mg | ORAL_TABLET | Freq: Every evening | ORAL | 0 refills | Status: DC

## 2018-12-20 MED ORDER — FAMOTIDINE 40 MG PO TABS: 40.0000 mg | ORAL_TABLET | Freq: Two times a day (BID) | ORAL | 0 refills | Status: DC

## 2018-12-20 MED ORDER — HYDROXYZINE HCL 25 MG PO TABS: 12.5000 mg | ORAL_TABLET | Freq: Four times a day (QID) | ORAL | 0 refills | Status: DC | PRN

## 2018-12-20 MED ORDER — TRIAMCINOLONE ACETONIDE 0.1 % EX CREA: 1.0000 "application " | TOPICAL_CREAM | Freq: Two times a day (BID) | CUTANEOUS | 0 refills | Status: DC

## 2018-12-20 NOTE — Discharge Instructions (Signed)
I would like for you to start Pepcid 40 mg twice daily to decrease allergic reaction complete the entire course of prescription.  I have also prescribed Atarax 12.5 to 25 mg up to 3 times a day as needed for itching.  For your nasal congestion I have prescribed levocetirizine 5 mg to be taken at bedtime to relieve sinus pressure.  If you develop any symptoms of shortness of breath, throat swelling, nausea, vomiting, headache go immediately to the ER as this may be signs of a worsening allergic reaction.  Stop Invokana contact Dr. Madilyn Fireman on Monday.

## 2018-12-20 NOTE — ED Triage Notes (Signed)
Patient reports flushed face and neck with some itching of scalp this early afternoon. She took first dose of new rx: Invokana 300mg  po this morning and wonders if allergic reaction. Took benadryl 25 mg po at 1400; no dyspnea or edema.

## 2018-12-20 NOTE — Telephone Encounter (Signed)
Invokana has caused her to have a flushing, she feels warm in her face and it is red. She also notices some itching. Denies swelling in mouth. I advised patient to go ahead and take some benadryl. Please advise.

## 2018-12-20 NOTE — ED Provider Notes (Signed)
[facility]    CSN: [id number] Arrival date & time: 12/20/18  1530     History   Chief Complaint Chief Complaint  Patient presents with  . Rash    HPI Nicole Mata is a 58 y.o. female.   HPI  Allergic reaction  Patient was seen by her PCP on 12/18/2018 and recently started taking Invokana today. She took medication with breakfast this morning without problem however around lunchtime she developed a pruritic rash around her face extending to her upper neck. Her face gradually grew reddened throughout the afternoon and became more pruritic. She contacted her PCP and was advised to follow-up here for evaluation.  Patient has multiple allergies to multiple medications medications.  She denies any respiratory distress, throat itching or swelling, chest tightness or vomiting. Past Medical History:  Diagnosis Date  . Ankle fracture, right 04/12/13  . Diabetes mellitus without complication ([place])   . Diverticulosis   . Gastropathy    reactive  . GERD (gastroesophageal reflux disease)   . Helicobacter pylori gastritis   . Hiatal hernia   . IBS (irritable bowel syndrome)   . Ileitis   . PONV (postoperative nausea and vomiting)     Patient Active Problem List   Diagnosis Date Noted  . Eustachian tube dysfunction, bilateral 02/01/2018  . Lumbar spondylosis 06/21/2017  . Urinary urgency 10/04/2016  . Diabetic peripheral neuropathy ([place]) 10/04/2016  . Hyperlipidemia 06/18/2015  . Chondromalacia of right patellofemoral joint 12/15/2014  . History of kidney stones 12/15/2014  . Type II diabetes mellitus, uncontrolled ([place]) 11/25/2013  . Menopausal symptom 06/04/2013  . Umbilical hernia 75/64/3329  . Small bowel disease 10/14/2012  . Obese 02/06/2012  . Cervical radiculopathy at C7 01/16/2011  . GERD 12/17/2008  . HIATAL HERNIA 12/17/2008  . IRRITABLE BOWEL SYNDROME 12/17/2008  . HELICOBACTER PYLORI GASTRITIS, HX OF 12/17/2008    Past Surgical History:  Procedure  Laterality Date  . CHOLECYSTECTOMY  2001  . LAPAROSCOPY  11/19/2012   Procedure: LAPAROSCOPY DIAGNOSTIC;  Surgeon: Joyice Faster. Cornett, MD;  Location: [facility] [facility];  Service: General;  Laterality: N/A;  Diagnostic laparoscopy  . SHOULDER SURGERY  2002, 2003   bilateral for bone spurs and torn RC  . UMBILICAL HERNIA REPAIR  11/19/2012   Procedure: LAPAROSCOPIC UMBILICAL HERNIA;  Surgeon: Joyice Faster. Cornett, MD;  Location: [facility];  Service: General;  Laterality: N/A;  ventral hernia repair with mesh    OB History    Gravida  1   Para      Term      Preterm      AB  1   Living        SAB  1   TAB      Ectopic      Multiple      Live Births               Home Medications    Prior to Admission medications   Medication Sig Start Date End Date Taking? Authorizing Provider  Blood Glucose Monitoring Suppl (BAYER CONTOUR MONITOR) w/Device KIT Use to check blood sugar one time daily. Dx: E11.65. DUE FOR FOLLOW UP 06/16/16   Hali Marry, MD  canagliflozin [facility]) 300 MG TABS tablet Take 1 tablet (300 mg total) by mouth daily before breakfast. 12/18/18   Hali Marry, MD  DEXILANT 60 MG capsule Take 1 capsule (60 mg total) by mouth daily before breakfast. 09/18/18   Hali Marry, MD  fluticasone (  FLONASE) 50 MCG/ACT nasal spray Place 2 sprays into the nose daily. 09/10/13   Breeback, Jade L, PA-C  glucose blood (BAYER CONTOUR TEST) test strip For checking blood sugar once daily. Dx:E11.9 01/23/18   Hali Marry, MD  Insulin Pen Needle 31G X 8 MM MISC Injected into skin once daily. Dx: E11.9 01/30/18   Hali Marry, MD  ipratropium (ATROVENT) 0.03 % nasal spray Place 2 sprays into both nostrils 4 (four) times daily. 01/04/17   Emeterio Reeve, DO  JANUMET XR [phone] MG TB24 TAKE 1 TABLET BY MOUTH DAILY 04/23/18   Hali Marry, MD  ketotifen (ZADITOR) 0.025 % ophthalmic solution Place 1 drop into both eyes every morning.    [provider]  meloxicam (MOBIC) 15 MG tablet Take 1 tablet (15 mg total) by mouth daily. 03/26/18   Hali Marry, MD  methocarbamol (ROBAXIN) 500 MG tablet Take 1 tablet (500 mg total) by mouth 2 (two) times daily. 09/18/18   Hali Marry, MD  MICROLET LANCETS MISC USE TO CHECK SUGAR ONCE DAILY 12/10/17   Hali Marry, MD  pravastatin (PRAVACHOL) 40 MG tablet Take 1 tablet (40 mg total) by mouth at bedtime. 04/17/17   Hali Marry, MD  pregabalin (LYRICA) 25 MG capsule Take 1 capsule (25 mg total) by mouth 2 (two) times daily. 04/17/17   Hali Marry, MD  traMADol (ULTRAM) 50 MG tablet Take 1 tablet (50 mg total) by mouth every 6 (six) hours as needed. 07/04/18   Phelps, Erin O, PA-C  VICTOZA 18 MG/3ML SOPN INJECT 0.6 MG UNDER THE SKIN DAILY. AFTER 2 WEEKS CAN INCREASE TO 1.2 MG DAILY 08/09/18   Hali Marry, MD    Family History Family History  Problem Relation Age of Onset  . Heart disease Other        family disease  . Heart attack Father 46       grandparents  . Hypertension Father   . Hyperlipidemia Father   . Diabetes Maternal Grandmother   . Diabetes Maternal Uncle   . Ovarian cancer Maternal Aunt        x 2  . Hypertension Mother   . Hyperlipidemia Mother   . Diabetes type II Mother   . Diabetes Brother   . Ovarian cancer Maternal Aunt   . Colon cancer Neg Hx     Social History Social History   Tobacco Use  . Smoking status: Never Smoker  . Smokeless tobacco: Never Used  Substance Use Topics  . Alcohol use: No  . Drug use: No     Allergies   Penicillins; Sulfa drugs cross reactors; Contrast media [iodinated diagnostic agents]; Atorvastatin; Gabapentin; Invokana [canagliflozin]; Januvia [sitagliptin]; Morphine and related; Prednisone; Azithromycin; and Hydrocodone   Review of Systems Review of Systems Pertinent negatives listed in HPI Physical Exam Triage Vital Signs ED Triage Vitals  Enc Vitals Group     BP  12/20/18 1610 126/80     Pulse Rate 12/20/18 1610 86     Resp 12/20/18 1610 18     Temp 12/20/18 1610 98.2 F (36.8 C)     Temp Source 12/20/18 1610 Oral     SpO2 12/20/18 1610 97 %     Weight 12/20/18 1611 219 lb (99.3 kg)     Height 12/20/18 1611 '5\' 7"'  (1.702 m)     Head Circumference --      Peak Flow --      Pain Score 12/20/18  1611 0     Pain Loc --      Pain Edu? --      Excl. in [place]? --    No data found.  Updated Vital Signs BP 126/80 (BP Location: Right Arm)   Pulse 86   Temp 98.2 F (36.8 C) (Oral)   Resp 18   Ht '5\' 7"'  (1.702 m)   Wt 219 lb (99.3 kg)   SpO2 97%   BMI 34.30 kg/m   Visual Acuity Right Eye Distance:   Left Eye Distance:   Bilateral Distance:    Right Eye Near:   Left Eye Near:    Bilateral Near:     Physical Exam General appearance: alert, well developed, well nourished, cooperative and in no distress Head: Normocephalic, without obvious abnormality, atraumatic Respiratory: Airway is open. Respirations even and unlabored, normal respiratory rate.  No wheezing Extremities: No gross deformities Skin: Face erythemic macular rash covering bilateral jaws and bridge of nose.  Rash is dry and excoriated.  She has a macular type erythemic rash on her upper chest.   Psych: Appropriate mood and affect. Neurologic: Mental status: Alert, oriented to person, place, and time, thought content appropriate.  UC Treatments / Results  Labs (all labs ordered are listed, but only abnormal results are displayed) Labs Reviewed - No data to display  EKG None  Radiology No results found.  Procedures Procedures (including critical care time)  Medications Ordered in UC Medications - No data to display  Initial Impression / Assessment and Plan / UC Course  I have reviewed the triage vital signs and the nursing notes.  Pertinent labs & imaging results that were available during my care of the patient were reviewed by me and considered in my medical  decision making (see chart for details).    Patient presents today with an acute rash which developed after starting a new antidiabetic medication prescribed on 12/18/2018 however she started taking the doses today.  She is negative of any respiratory symptoms nor complains of throat itching or swelling.  Rash is localized to the lower part of the face and extends to the neck area.  She is in no distress.  However is intolerant of prednisone.  We will start her on high-dose Pepcid 40 mg twice daily for 5 days, along with Atarax 12.5 to 25 mg up to 3 times a day as needed for itching.  She also has some underlying nasal congestion which is been present prior to medication reaction therefore will prescribe levocetirizine 5 mg.  Patient is not to Flonase intermittently.  Advised to resume use as prescribed.  Patient educated as if symptoms worsen or do not improve to follow-up the ER.  She is to contact her PCP on Monday as Invokana was discontinued today.   Final Clinical Impressions(s) / UC Diagnoses   Final diagnoses:  Allergic reaction, initial encounter  Macular rash     Discharge Instructions     I would like for you to start Pepcid 40 mg twice daily to decrease allergic reaction complete the entire course of prescription.  I have also prescribed Atarax 12.5 to 25 mg up to 3 times a day as needed for itching.  For your nasal congestion I have prescribed levocetirizine 5 mg to be taken at bedtime to relieve sinus pressure.  If you develop any symptoms of shortness of breath, throat swelling, nausea, vomiting, headache go immediately to the ER as this may be signs of a worsening allergic reaction.  Stop Invokana contact Dr. Madilyn Fireman on Monday.    ED Prescriptions    Medication Sig Dispense Auth. Provider   levocetirizine (XYZAL) 5 MG tablet Take 1 tablet (5 mg total) by mouth every evening. 30 tablet Scot Jun, FNP   famotidine (PEPCID) 40 MG tablet Take 1 tablet (40 mg total) by mouth  2 (two) times daily for 5 days. 10 tablet Scot Jun, FNP   hydrOXYzine (ATARAX/VISTARIL) 25 MG tablet Take 0.5-1 tablets (12.5-25 mg total) by mouth every 6 (six) hours as needed for itching. 12 tablet Scot Jun, FNP     Controlled Substance Prescriptions [place] Controlled Substance Registry consulted? Not Applicable   Scot Jun, [place] 12/20/18 [phone]

## 2019-01-01 ENCOUNTER — Ambulatory Visit: Payer: BLUE CROSS/BLUE SHIELD | Admitting: Internal Medicine

## 2019-01-02 ENCOUNTER — Ambulatory Visit (INDEPENDENT_AMBULATORY_CARE_PROVIDER_SITE_OTHER): Payer: BLUE CROSS/BLUE SHIELD | Admitting: Internal Medicine

## 2019-01-02 ENCOUNTER — Encounter: Payer: Self-pay | Admitting: Internal Medicine

## 2019-01-02 VITALS — BP 124/80 | HR 76 | Ht 67.0 in | Wt 220.0 lb

## 2019-01-02 DIAGNOSIS — Z1211 Encounter for screening for malignant neoplasm of colon: Secondary | ICD-10-CM

## 2019-01-02 DIAGNOSIS — K219 Gastro-esophageal reflux disease without esophagitis: Secondary | ICD-10-CM

## 2019-01-02 MED ORDER — NA SULFATE-K SULFATE-MG SULF 17.5-3.13-1.6 GM/177ML PO SOLN: 1.0000 | Freq: Once | ORAL | 0 refills | Status: AC

## 2019-01-02 NOTE — Progress Notes (Signed)
HISTORY OF PRESENT ILLNESS:  Nicole Mata is a 58 y.o. 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  As mentioned, patient was seen in this office remotely.  Last time 2014 by the GI physician assistant.  In any event, she carries GI diagnoses of GERD, diverticulosis, and irritable bowel syndrome.  Her last colonoscopy was October 2008.  The examination was normal.  Follow-up in 10 years recommended.  Previous upper endoscopy performed June 2010.  This revealed a hiatal hernia and mild antral gastritis without evidence of Helicobacter pylori on biopsy for histology..  Patient tells me that she has had longstanding GERD which had been managed with Dexilant 60 mg daily till about 5 months ago.  Since then she has had constant recurrent pyrosis.  Significant regurgitation and nocturnal pyrosis.  Her weight has been stable.  She denies dysphasia.  She will experience some chest discomfort with reflux episodes.  She has been diabetic since about 2014.  Next, she reports alternating bowel habits.  However she does not require laxatives for constipation.  She will occasionally use Imodium situationally when she knows she needs to be out in public.  No bleeding.  Some bloating.  Review of outside blood work from January 2020 finds normal basic metabolic panel.  Review of x-ray file shows CT scan of abdomen and pelvis without contrast November 2013 suggesting proximal jejunal intussusception and small umbilical hernia.  Subsequent laparoscopy with umbilical hernia repair December 2013 for an incarcerated ventral hernia.  REVIEW OF SYSTEMS:  All non-GI ROS negative unless otherwise stated in the HPI except for arthritis  Past Medical History:  Diagnosis Date  . Ankle fracture, right 04/12/13  . Diabetes mellitus without complication ([facility])   . Diverticulosis   . Gastropathy    reactive  .  GERD (gastroesophageal reflux disease)   . Helicobacter pylori gastritis   . Hiatal hernia   . IBS (irritable bowel syndrome)   . Ileitis   . PONV (postoperative nausea and vomiting)     Past Surgical History:  Procedure Laterality Date  . CHOLECYSTECTOMY  2001  . LAPAROSCOPY  11/19/2012   Procedure: LAPAROSCOPY DIAGNOSTIC;  Surgeon: Joyice Faster. Cornett, MD;  Location: [facility];  Service: General;  Laterality: N/A;  Diagnostic laparoscopy  . SHOULDER SURGERY  2002, 2003   bilateral for bone spurs and torn RC  . UMBILICAL HERNIA REPAIR  11/19/2012   Procedure: LAPAROSCOPIC UMBILICAL HERNIA;  Surgeon: Joyice Faster. Cornett, MD;  Location: [facility];  Service: General;  Laterality: N/A;  ventral hernia repair with mesh    Social History ODDIE KUHLMANN  reports that she has never smoked. She has never used smokeless tobacco. She reports current alcohol use. She reports that she does not use drugs.  family history includes Diabetes in her brother, maternal grandmother, and maternal uncle; Diabetes type II in her mother; Heart attack (age of onset: 66) in her father; Heart disease in an other family member; Hyperlipidemia in her father and mother; Hypertension in her father and mother; Ovarian cancer in her maternal aunt and maternal aunt.  Allergies  Allergen Reactions  . Penicillins Anaphylaxis  . Sulfa Drugs Cross Reactors Anaphylaxis  . Contrast Media [Iodinated Diagnostic Agents] Hives  . Atorvastatin Other (See Comments)    Muscle aches  . Gabapentin Other (See Comments)    GERD  . Januvia [Sitagliptin] Other (See Comments)  Hot flashes and flushed and dizzy  . Morphine And Related Nausea And Vomiting    Pt chooses not to take it  . Prednisone     Other reaction(s): Confusion  . Azithromycin Other (See Comments)    Other reaction(s): Other Makes her feel really crazy. Makes her feel really crazy.  Marland Kitchen Hydrocodone Nausea And Vomiting  . Invokana [Canagliflozin] Rash    Rash starting on  her face       PHYSICAL EXAMINATION: Vital signs: BP 124/80   Pulse 76   Ht 5\' 7"  (1.702 m)   Wt 220 lb (99.8 kg)   BMI 34.46 kg/m   Constitutional: generally well-appearing, no acute distress Psychiatric: alert and oriented x3, cooperative Eyes: extraocular movements intact, anicteric, conjunctiva pink Mouth: oral pharynx moist, no lesions.  No thrush Neck: supple no lymphadenopathy Cardiovascular: heart regular rate and rhythm, no murmur Lungs: clear to auscultation bilaterally Abdomen: soft, nontender, nondistended, no obvious ascites, no peritoneal signs, normal bowel sounds, no organomegaly Rectal: Deferred until colonoscopy Extremities: no clubbing, cyanosis, or lower extremity edema bilaterally Skin: no lesions on visible extremities Neuro: No focal deficits.  Cranial nerves intact  ASSESSMENT:  1.  GERD.  Severe symptoms despite daily PPI therapy.  Rule out refractory GERD.  Rule out Candida esophagitis.  Rule out interval gastroparesis 2.  Irritable bowel syndrome.  Alternating bowel habits.  Chronic.  Stable 3.  Screening colonoscopy.  Last examination 2008 was negative.  Due for follow-up 4.  Obesity  PLAN:  1.  Reflux precautions 2.  Weight loss 3.  Continue Dexilant 60 mg daily 4.  Schedule upper endoscopy.  The patient is high risk given her comorbidities and BMI and the need to address her diabetic therapies.The nature of the procedure, as well as the risks, benefits, and alternatives were carefully and thoroughly reviewed with the patient. Ample time for discussion and questions allowed. The patient understood, was satisfied, and agreed to proceed. 5.  Schedule screening colonoscopy.  The patient is high risk as above.The nature of the procedure, as well as the risks, benefits, and alternatives were carefully and thoroughly reviewed with the patient. Ample time for discussion and questions allowed. The patient understood, was satisfied, and agreed to proceed. 6.   Hold diabetic medications the day of the procedure to avoid unwanted hypoglycemia 7.  After evaluation of symptoms felt secondary to GERD I would increase her PPI to twice daily and arrange follow-up

## 2019-01-02 NOTE — Patient Instructions (Signed)

## 2019-01-08 ENCOUNTER — Encounter: Payer: Self-pay | Admitting: Internal Medicine

## 2019-01-13 ENCOUNTER — Telehealth: Payer: Self-pay

## 2019-01-13 MED ORDER — CEFDINIR 300 MG PO CAPS: 300.0000 mg | ORAL_CAPSULE | Freq: Two times a day (BID) | ORAL | 0 refills | Status: DC

## 2019-01-13 NOTE — Telephone Encounter (Signed)
Trypsin sent.  If not better after the antibiotics then please call us back and we can have her come in for an appointment.

## 2019-01-13 NOTE — Telephone Encounter (Signed)
Nicole Mata called and states she was seen by Dr Madilyn Fireman at the end of January about her right ear being clogged. She was advised to take Flonase. She states she now has right ear pain, sinus pressure and headaches. She was wanting an antibiotic. I did offer an appointment. She wanted the message to be sent first. Denies sore throat, fever, chills, sweats or cough.

## 2019-01-13 NOTE — Telephone Encounter (Signed)
Left message advising of recommendations.  

## 2019-01-14 MED ORDER — LEVOFLOXACIN 500 MG PO TABS: 500.0000 mg | ORAL_TABLET | Freq: Every day | ORAL | 0 refills | Status: DC

## 2019-01-14 NOTE — Telephone Encounter (Signed)
Nicole Mata didn't pick up [place] [facility] because the pharmacy said it is close to PCN. She is allergic to PCN. Please advise.

## 2019-01-14 NOTE — Telephone Encounter (Signed)
Pt advised.

## 2019-01-14 NOTE — Telephone Encounter (Signed)
Ok, new rx sent for Levaquin instead.

## 2019-01-14 NOTE — Addendum Note (Signed)
Addended by: Beatrice Lecher D on: 01/14/2019 12:08 PM   Modules accepted: Orders

## 2019-01-22 ENCOUNTER — Encounter: Payer: Self-pay | Admitting: Internal Medicine

## 2019-01-22 ENCOUNTER — Ambulatory Visit (AMBULATORY_SURGERY_CENTER): Payer: BLUE CROSS/BLUE SHIELD | Admitting: Internal Medicine

## 2019-01-22 VITALS — BP 114/58 | HR 67 | Temp 98.4°F | Resp 16 | Ht 67.0 in | Wt 220.0 lb

## 2019-01-22 DIAGNOSIS — D120 Benign neoplasm of cecum: Secondary | ICD-10-CM

## 2019-01-22 DIAGNOSIS — K317 Polyp of stomach and duodenum: Secondary | ICD-10-CM

## 2019-01-22 DIAGNOSIS — K621 Rectal polyp: Secondary | ICD-10-CM

## 2019-01-22 DIAGNOSIS — D123 Benign neoplasm of transverse colon: Secondary | ICD-10-CM

## 2019-01-22 DIAGNOSIS — D129 Benign neoplasm of anus and anal canal: Secondary | ICD-10-CM

## 2019-01-22 DIAGNOSIS — K219 Gastro-esophageal reflux disease without esophagitis: Secondary | ICD-10-CM

## 2019-01-22 DIAGNOSIS — Z1211 Encounter for screening for malignant neoplasm of colon: Secondary | ICD-10-CM | POA: Diagnosis not present

## 2019-01-22 DIAGNOSIS — D128 Benign neoplasm of rectum: Secondary | ICD-10-CM

## 2019-01-22 MED ORDER — SODIUM CHLORIDE 0.9 % IV SOLN: 500.0000 mL | Freq: Once | INTRAVENOUS | Status: DC

## 2019-01-22 NOTE — Progress Notes (Signed)
Called to room to assist during endoscopic procedure.  Patient ID and intended procedure confirmed with present staff. Received instructions for my participation in the procedure from the performing physician.  

## 2019-01-22 NOTE — Progress Notes (Signed)
To PACU, VSS. Report to [email or web address] 

## 2019-01-22 NOTE — Patient Instructions (Signed)
Please read handouts provided. Continue present medications. Reflux precautions. Return to care of primary provider. GI follow-up as needed. Await pathology results.      YOU HAD AN ENDOSCOPIC PROCEDURE TODAY AT [facility]:   Refer to the procedure report that was given to you for any specific questions about what was found during the examination.  If the procedure report does not answer your questions, please call your gastroenterologist to clarify.  If you requested that your care partner not be given the details of your procedure findings, then the procedure report has been included in a sealed envelope for you to review at your convenience later.  YOU SHOULD EXPECT: Some feelings of bloating in the abdomen. Passage of more gas than usual.  Walking can help get rid of the air that was put into your GI tract during the procedure and reduce the bloating. If you had a lower endoscopy (such as a colonoscopy or flexible sigmoidoscopy) you may notice spotting of blood in your stool or on the toilet paper. If you underwent a bowel prep for your procedure, you may not have a normal bowel movement for a few days.  Please Note:  You might notice some irritation and congestion in your nose or some drainage.  This is from the oxygen used during your procedure.  There is no need for concern and it should clear up in a day or so.  SYMPTOMS TO REPORT IMMEDIATELY:   Following lower endoscopy (colonoscopy or flexible sigmoidoscopy):  Excessive amounts of blood in the stool  Significant tenderness or worsening of abdominal pains  Swelling of the abdomen that is new, acute  Fever of 100F or higher   Following upper endoscopy (EGD)  Vomiting of blood or coffee ground material  New chest pain or pain under the shoulder blades  Painful or persistently difficult swallowing  New shortness of breath  Fever of 100F or higher  Black, tarry-looking stools  For urgent or emergent issues,  a gastroenterologist can be reached at any hour by calling [phone].   DIET:  We do recommend a small meal at first, but then you may proceed to your regular diet.  Drink plenty of fluids but you should avoid alcoholic beverages for 24 hours.  ACTIVITY:  You should plan to take it easy for the rest of today and you should NOT DRIVE or use heavy machinery until tomorrow (because of the sedation medicines used during the test).    FOLLOW UP: Our staff will call the number listed on your records the next business day following your procedure to check on you and address any questions or concerns that you may have regarding the information given to you following your procedure. If we do not reach you, we will leave a message.  However, if you are feeling well and you are not experiencing any problems, there is no need to return our call.  We will assume that you have returned to your regular daily activities without incident.  If any biopsies were taken you will be contacted by phone or by letter within the next 1-3 weeks.  Please call us at [phone] if you have not heard about the biopsies in 3 weeks.    SIGNATURES/CONFIDENTIALITY: You and/or your care partner have signed paperwork which will be entered into your electronic medical record.  These signatures attest to the fact that that the information above on your After Visit Summary has been reviewed and is understood.  Full responsibility of the confidentiality of this discharge information lies with you and/or your care-partner.

## 2019-01-22 NOTE — Op Note (Signed)
[facility] Patient Name: Nicole Mata Procedure Date: 01/22/2019 3:20 PM MRN: [id number] Endoscopist: Docia Chuck. Henrene Pastor , MD Age: 58 Referring MD:  Date of Birth: Feb 10, 1961 Gender: Female Account #: [id number] Procedure:                Colonoscopy with cold snare polypectomy x 3 Indications:              Screening for colorectal malignant neoplasm Medicines:                Monitored Anesthesia Care Procedure:                Pre-Anesthesia Assessment:                           - Prior to the procedure, a History and Physical                            was performed, and patient medications and                            allergies were reviewed. The patient's tolerance of                            previous anesthesia was also reviewed. The risks                            and benefits of the procedure and the sedation                            options and risks were discussed with the patient.                            All questions were answered, and informed consent                            was obtained. Prior Anticoagulants: The patient has                            taken no previous anticoagulant or antiplatelet                            agents. ASA Grade Assessment: II - A patient with                            mild systemic disease. After reviewing the risks                            and benefits, the patient was deemed in                            satisfactory condition to undergo the procedure.                           After obtaining informed consent, the colonoscope  was passed under direct vision. Throughout the                            procedure, the patient's blood pressure, pulse, and                            oxygen saturations were monitored continuously. The                            Colonoscope was introduced through the anus and                            advanced to the the cecum, identified by     appendiceal orifice and ileocecal valve. The                            ileocecal valve, appendiceal orifice, and rectum                            were photographed. The quality of the bowel                            preparation was excellent. The colonoscopy was                            performed without difficulty. The patient tolerated                            the procedure well. The bowel preparation used was                            SUPREP. Scope In: 3:29:55 PM Scope Out: 3:45:10 PM Scope Withdrawal Time: 0 hours 12 minutes 18 seconds  Total Procedure Duration: 0 hours 15 minutes 15 seconds  Findings:                 Three polyps were found in the rectum, transverse                            colon and cecum. The polyps were 2 to 3 mm in size.                            These polyps were removed with a cold snare.                            Resection and retrieval were complete.                           Multiple small and large-mouthed diverticula were                            found in the left colon.                           Internal hemorrhoids were  found during retroflexion.                           The exam was otherwise without abnormality on                            direct and retroflexion views. Complications:            No immediate complications. Estimated blood loss:                            None. Estimated Blood Loss:     Estimated blood loss: none. Impression:               - Three 2 to 3 mm polyps in the rectum, in the                            transverse colon and in the cecum, removed with a                            cold snare. Resected and retrieved.                           - Diverticulosis in the left colon.                           - Internal hemorrhoids.                           - The examination was otherwise normal on direct                            and retroflexion views. Recommendation:           - Repeat colonoscopy in 5 years for  surveillance.                           - Patient has a contact number available for                            emergencies. The signs and symptoms of potential                            delayed complications were discussed with the                            patient. Return to normal activities tomorrow.                            Written discharge instructions were provided to the                            patient.                           - Resume previous diet.                           -  Continue present medications.                           - Await pathology results. Docia Chuck. Henrene Pastor, MD 01/22/2019 3:48:51 PM This report has been signed electronically.

## 2019-01-22 NOTE — Op Note (Signed)
[facility] Patient Name: Nicole Mata Procedure Date: 01/22/2019 3:20 PM MRN: [id number] Endoscopist: Docia Chuck. Henrene Pastor , MD Age: 58 Referring MD:  Date of Birth: [birth year] Gender: Female Account #: [id number] Procedure:                Upper GI endoscopy Indications:              Esophageal reflux Medicines:                Monitored Anesthesia Care Procedure:                Pre-Anesthesia Assessment:                           - Prior to the procedure, a History and Physical                            was performed, and patient medications and                            allergies were reviewed. The patient's tolerance of                            previous anesthesia was also reviewed. The risks                            and benefits of the procedure and the sedation                            options and risks were discussed with the patient.                            All questions were answered, and informed consent                            was obtained. Prior Anticoagulants: The patient has                            taken no previous anticoagulant or antiplatelet                            agents. ASA Grade Assessment: II - A patient with                            mild systemic disease. After reviewing the risks                            and benefits, the patient was deemed in                            satisfactory condition to undergo the procedure.                           After obtaining informed consent, the endoscope was  passed under direct vision. Throughout the                            procedure, the patient's blood pressure, pulse, and                            oxygen saturations were monitored continuously. The                            Model GIF-HQ190 [id number]) scope was introduced                            through the mouth, and advanced to the second part                            of duodenum. The upper GI endoscopy  was                            accomplished without difficulty. The patient                            tolerated the procedure well. Scope In: Scope Out: Findings:                 The esophagus was normal with no active                            inflammation or stricture. No Barrett's.                           The stomach was normal except for several                            diminutive benign fundic gland type polyps.                           The examined duodenum was normal.                           The cardia and gastric fundus were normal on                            retroflexion. Complications:            No immediate complications. Estimated Blood Loss:     Estimated blood loss: none. Impression:               1. GERD                           2. Essentially normal EGD. Recommendation:           - Patient has a contact number available for                            emergencies. The signs and symptoms of potential  delayed complications were discussed with the                            patient. Return to normal activities tomorrow.                            Written discharge instructions were provided to the                            patient.                           - Resume previous diet.                           - Continue present medications.                           - Reflux precautions                           - Return to the care of your primary provider. GI                            follow-up as needed Docia Chuck. Henrene Pastor, MD 01/22/2019 3:56:01 PM This report has been signed electronically.

## 2019-01-22 NOTE — Progress Notes (Signed)
Pt's states no medical or surgical changes since previsit or office visit. 

## 2019-01-23 ENCOUNTER — Telehealth: Payer: Self-pay

## 2019-01-23 NOTE — Telephone Encounter (Signed)
  Follow up Call-  Call back number 01/22/2019  Post procedure Call Back phone  # [phone]  Permission to leave phone message Yes  Some recent data might be hidden     Patient questions:  Do you have a fever, pain , or abdominal swelling? No. Pain Score  0 *  Have you tolerated food without any problems? Yes.    Have you been able to return to your normal activities? Yes.    Do you have any questions about your discharge instructions: Diet   No. Medications  No. Follow up visit  No.  Do you have questions or concerns about your Care? No.  Actions: * If pain score is 4 or above: No action needed, pain <4.

## 2019-01-27 ENCOUNTER — Encounter: Payer: Self-pay | Admitting: Internal Medicine

## 2019-01-28 ENCOUNTER — Telehealth: Payer: Self-pay

## 2019-01-28 NOTE — Telephone Encounter (Signed)
Nicole Mata's complains about her blood glucose readings being to low. She has been checking her blood glucose twice daily since the colonoscopy. She is taking all three medications daily. Please advise.   Fasting glucose 120 mg/dl 125 mg/dl 130 mg/dl  Random glucose a few hours after eating 115 mg/dl 90 mg/dl 76 mg/dl

## 2019-01-29 NOTE — Telephone Encounter (Signed)
OK, have her cut her glipizide in half and contiue to follow sugars.

## 2019-01-29 NOTE — Telephone Encounter (Signed)
Pt advised. Will cut tablet in half so she will be on 1/2 tab BID. Will check sugars for about a week and call back

## 2019-02-12 ENCOUNTER — Other Ambulatory Visit: Payer: Self-pay | Admitting: Family Medicine

## 2019-02-12 ENCOUNTER — Other Ambulatory Visit: Payer: Self-pay

## 2019-02-12 DIAGNOSIS — IMO0002 Reserved for concepts with insufficient information to code with codable children: Secondary | ICD-10-CM

## 2019-02-12 DIAGNOSIS — E1165 Type 2 diabetes mellitus with hyperglycemia: Secondary | ICD-10-CM

## 2019-02-12 DIAGNOSIS — E11649 Type 2 diabetes mellitus with hypoglycemia without coma: Secondary | ICD-10-CM

## 2019-02-12 MED ORDER — GLUCOSE BLOOD VI STRP: ORAL_STRIP | 11 refills | Status: DC

## 2019-03-24 ENCOUNTER — Ambulatory Visit: Payer: BLUE CROSS/BLUE SHIELD | Admitting: Family Medicine

## 2019-03-25 ENCOUNTER — Telehealth: Payer: Self-pay | Admitting: Family Medicine

## 2019-03-25 NOTE — Telephone Encounter (Signed)
EROR

## 2019-04-07 ENCOUNTER — Ambulatory Visit: Payer: BLUE CROSS/BLUE SHIELD | Admitting: Family Medicine

## 2019-04-14 ENCOUNTER — Ambulatory Visit (INDEPENDENT_AMBULATORY_CARE_PROVIDER_SITE_OTHER): Payer: BLUE CROSS/BLUE SHIELD | Admitting: Family Medicine

## 2019-04-14 ENCOUNTER — Encounter: Payer: Self-pay | Admitting: Family Medicine

## 2019-04-14 VITALS — BP 134/76 | HR 86 | Ht 67.0 in | Wt 216.0 lb

## 2019-04-14 DIAGNOSIS — E119 Type 2 diabetes mellitus without complications: Secondary | ICD-10-CM

## 2019-04-14 DIAGNOSIS — K2100 Gastro-esophageal reflux disease with esophagitis, without bleeding: Secondary | ICD-10-CM

## 2019-04-14 DIAGNOSIS — E1142 Type 2 diabetes mellitus with diabetic polyneuropathy: Secondary | ICD-10-CM

## 2019-04-14 LAB — POCT GLYCOSYLATED HEMOGLOBIN (HGB A1C): Hemoglobin A1C: 7 % — AB (ref 4.0–5.6)

## 2019-04-14 MED ORDER — SEMAGLUTIDE(0.25 OR 0.5MG/DOS) 2 MG/1.5ML ~~LOC~~ SOPN: 0.2500 mg | PEN_INJECTOR | SUBCUTANEOUS | 2 refills | Status: DC

## 2019-04-14 MED ORDER — ONDANSETRON 4 MG PO TBDP: 4.0000 mg | ORAL_TABLET | Freq: Three times a day (TID) | ORAL | 0 refills | Status: DC | PRN

## 2019-04-14 NOTE — Progress Notes (Signed)
Subjective:    CC: F/U DM  HPI:  Diabetes - no hypoglycemic events. No wounds or sores that are not healing well. No increased thirst or urination. Checking glucose at home. Taking medications as prescribed without any side effects.  She actually called in February complaining that her sugars are dropping a little too low especially postmeal.  Dropping below 80 so we decided to back off on the glipizide to half a tab twice a day.  That her last A1c was uncontrolled.  She says overall she has been doing better.  She is actually been able to work from home over the last month and she has been exercising more.  She has been able to walk more consistently and so has actually lost about 4 pounds.  She says she would like to consider switching off the Victoza to 1 of the weekly injectables.  She says sometimes she gets so nauseated with the Victoza that she will skip it for a few days.  Although up GERD-she says overall she does really well.  She notices a little bit of upper chest tightness when she does not take her Dexilant but when she does take it she seems to get relief so she really feels like it is probably reflux related.  Past medical history, Surgical history, Family history not pertinant except as noted below, Social history, Allergies, and medications have been entered into the medical record, reviewed, and corrections made.   Review of Systems: No fevers, chills, night sweats, weight loss, chest pain, or shortness of breath.   Objective:    General: Well Developed, well nourished, and in no acute distress.  Neuro: Alert and oriented x3, extra-ocular muscles intact, sensation grossly intact.  HEENT: Normocephalic, atraumatic tympanic membranes both bilaterally mildly distended with distortion of the light reflex but no erythema. Skin: Warm and dry, no rashes. Cardiac: Regular rate and rhythm, no murmurs rubs or gallops, no lower extremity edema.  Respiratory: Clear to auscultation  bilaterally. Not using accessory muscles, speaking in full sentences.   Impression and Recommendations:   DM -much improved.  A1c down to 7.0.  Discussed importance of getting her underneath 7 but we are making some great progress considering her last A1c was 7.9.  We will switch her off of Victoza over to Ozempic weekly will stick with a lower dose because of potential for nausea.  Continue with the half a tablet glipizide.  Also continue with the daily walking which has had a great impact on her weight.  Like to see her lose about another 4 to 5 pounds before I see her back in 3 months.  Diabetic periopheral neuropathy -continue with Lyrica.  GERD-continue Dexilant.

## 2019-04-19 ENCOUNTER — Emergency Department (INDEPENDENT_AMBULATORY_CARE_PROVIDER_SITE_OTHER)
Admission: EM | Admit: 2019-04-19 | Discharge: 2019-04-19 | Disposition: A | Discharge status: Home / Self Care | Payer: BLUE CROSS/BLUE SHIELD | Source: Home / Self Care

## 2019-04-19 ENCOUNTER — Encounter: Payer: Self-pay | Admitting: Emergency Medicine

## 2019-04-19 ENCOUNTER — Other Ambulatory Visit: Payer: Self-pay

## 2019-04-19 DIAGNOSIS — R112 Nausea with vomiting, unspecified: Secondary | ICD-10-CM

## 2019-04-19 DIAGNOSIS — R197 Diarrhea, unspecified: Secondary | ICD-10-CM | POA: Diagnosis not present

## 2019-04-19 DIAGNOSIS — R109 Unspecified abdominal pain: Secondary | ICD-10-CM

## 2019-04-19 LAB — POCT CBC W AUTO DIFF (K'VILLE URGENT CARE)

## 2019-04-19 LAB — COMPLETE METABOLIC PANEL WITH GFR
AG Ratio: 1.3 (calc) (ref 1.0–2.5)
ALT: 11 U/L (ref 6–29)
AST: 14 U/L (ref 10–35)
Albumin: 4.3 g/dL (ref 3.6–5.1)
Alkaline phosphatase (APISO): 77 U/L (ref 37–153)
BUN: 13 mg/dL (ref 7–25)
CO2: 27 mmol/L (ref 20–32)
Calcium: 9.5 mg/dL (ref 8.6–10.4)
Chloride: 102 mmol/L (ref 98–110)
Creat: 0.81 mg/dL (ref 0.50–1.05)
GFR, Est African American: 95 mL/min/{1.73_m2} (ref >=60)
GFR, Est Non African American: 82 mL/min/{1.73_m2} (ref >=60)
Globulin: 3.3 g/dL (calc) (ref 1.9–3.7)
Glucose, Bld: 151 mg/dL — ABNORMAL HIGH (ref 65–99)
Potassium: 4.2 mmol/L (ref 3.5–5.3)
Sodium: 138 mmol/L (ref 135–146)
Total Bilirubin: 0.8 mg/dL (ref 0.2–1.2)
Total Protein: 7.6 g/dL (ref 6.1–8.1)

## 2019-04-19 NOTE — ED Triage Notes (Signed)
Patient reports intermittent diarrhea and abdominal cramping since yesterday; denies fever; vomited x1 with nausea preceding; experiencing heartburn and PND. No recent travel. Took imodium at 1130 and no current nausea.

## 2019-04-19 NOTE — ED Provider Notes (Signed)
[name]    CSN: [id number] Arrival date & time: 04/19/19  1055     History   Chief Complaint Chief Complaint  Patient presents with  . Diarrhea  . Abdominal Pain    HPI Nicole Mata is a 58 y.o. female.   HPI  Nicole Mata is a 58 y.o. female presenting to UC with c/o intermittent diarrhea, abdominal cramping, nausea and 1 episode of vomiting yesterday. She had about 3-4 episodes of watery diarrhea yesterday and about 4-5 episodes of watery diarrhea this morning.  associated heartburn and post-nasal drip. She took previously prescribed Zofran just PTA as well as imodium around 11:30AM this morning. She was able to keep down water this morning and had [employer] chicken last night and was able to keep that down.  She wants to make sure she is okay.  She was due for blood work last week as part of her annual physical but the lab was closed. She was wondering if it could be done today. Denies sick contacts or recent travel.    Past Medical History:  Diagnosis Date  . Ankle fracture, right 04/12/13  . Diabetes mellitus without complication ([place])   . Diverticulosis   . Gastropathy    reactive  . GERD (gastroesophageal reflux disease)   . Helicobacter pylori gastritis   . Hiatal hernia   . IBS (irritable bowel syndrome)   . Ileitis   . PONV (postoperative nausea and vomiting)     Patient Active Problem List   Diagnosis Date Noted  . Eustachian tube dysfunction, bilateral 02/01/2018  . Lumbar spondylosis 06/21/2017  . Urinary urgency 10/04/2016  . Diabetic peripheral neuropathy ([place]) 10/04/2016  . Hyperlipidemia 06/18/2015  . Chondromalacia of right patellofemoral joint 12/15/2014  . History of kidney stones 12/15/2014  . Type II diabetes mellitus, uncontrolled ([place]) 11/25/2013  . Menopausal symptom 06/04/2013  . Umbilical hernia 59/93/5701  . Small bowel disease 10/14/2012  . Obese 02/06/2012  . Cervical radiculopathy at C7 01/16/2011  . GERD  12/17/2008  . HIATAL HERNIA 12/17/2008  . IRRITABLE BOWEL SYNDROME 12/17/2008  . HELICOBACTER PYLORI GASTRITIS, HX OF 12/17/2008    Past Surgical History:  Procedure Laterality Date  . CHOLECYSTECTOMY  2001  . LAPAROSCOPY  11/19/2012   Procedure: LAPAROSCOPY DIAGNOSTIC;  Surgeon: Joyice Faster. Cornett, MD;  Location: [facility];  Service: General;  Laterality: N/A;  Diagnostic laparoscopy  . SHOULDER SURGERY  2002, 2003   bilateral for bone spurs and torn RC  . UMBILICAL HERNIA REPAIR  11/19/2012   Procedure: LAPAROSCOPIC UMBILICAL HERNIA;  Surgeon: Joyice Faster. Cornett, MD;  Location: [facility];  Service: General;  Laterality: N/A;  ventral hernia repair with mesh    OB History    Gravida  1   Para      Term      Preterm      AB  1   Living        SAB  1   TAB      Ectopic      Multiple      Live Births               Home Medications    Prior to Admission medications   Medication Sig Start Date End Date Taking? Authorizing Provider  Blood Glucose Monitoring Suppl (BAYER CONTOUR MONITOR) w/Device KIT Use to check blood sugar one time daily. Dx: E11.65. DUE FOR FOLLOW UP 06/16/16   Hali Marry, MD  DEXILANT 60  MG capsule Take 1 capsule (60 mg total) by mouth daily before breakfast. 09/18/18   Hali Marry, MD  fluticasone (FLONASE) 50 MCG/ACT nasal spray Place 2 sprays into the nose daily. 09/10/13   Breeback, Jade L, PA-C  glipiZIDE (GLUCOTROL) 10 MG tablet Take 10 mg by mouth 2 (two) times daily before a meal.    [provider]  glucose blood (BAYER CONTOUR TEST) test strip For checking blood sugar once daily. Dx:E11.9 02/12/19   Hali Marry, MD  Insulin Pen Needle (B-D ULTRAFINE III SHORT PEN) 31G X 8 MM MISC INJECT INTO THE SKIN ONCE DAILY AS DIRECTED 02/12/19   Hali Marry, MD  JANUMET XR [phone] MG TB24 TAKE 1 TABLET BY MOUTH DAILY 04/23/18   Hali Marry, MD  ketotifen (ZADITOR) 0.025 % ophthalmic solution Place 1  drop into both eyes every morning.    [provider]  Microlet Lancets MISC USE TO CHECK SUGAR ONCE DAILY 02/12/19   Hali Marry, MD  ondansetron (ZOFRAN-ODT) 4 MG disintegrating tablet Take 1 tablet (4 mg total) by mouth every 8 (eight) hours as needed for nausea or vomiting. 04/14/19   Hali Marry, MD  pregabalin (LYRICA) 25 MG capsule Take 1 capsule (25 mg total) by mouth 2 (two) times daily. 04/17/17   Hali Marry, MD  Semaglutide,0.25 or 0.5MG/DOS, (OZEMPIC, 0.25 OR 0.5 MG/DOSE,) 2 MG/1.5ML SOPN Inject 0.25 mg into the skin once a week. Then increase to 0.55m weekly 04/14/19   MHali Marry MD    Family History Family History  Problem Relation Age of Onset  . Heart disease Other        family disease  . Heart attack Father 597      grandparents  . Hypertension Father   . Hyperlipidemia Father   . Diabetes Maternal Grandmother   . Diabetes Maternal Uncle   . Ovarian cancer Maternal Aunt        x 2  . Hypertension Mother   . Hyperlipidemia Mother   . Diabetes type II Mother   . Diabetes Brother   . Ovarian cancer Maternal Aunt   . Colon cancer Neg Hx     Social History Social History   Tobacco Use  . Smoking status: Never Smoker  . Smokeless tobacco: Never Used  Substance Use Topics  . Alcohol use: Yes    Comment: very rarely  . Drug use: No     Allergies   Penicillins; Sulfa drugs cross reactors; Contrast media [iodinated diagnostic agents]; Atorvastatin; Gabapentin; Januvia [sitagliptin]; Morphine and related; Prednisone; Azithromycin; Hydrocodone; and Invokana [canagliflozin]   Review of Systems Review of Systems  Constitutional: Negative for chills and fever.  HENT: Negative for congestion, ear pain, sore throat, trouble swallowing and voice change.   Respiratory: Negative for cough and shortness of breath.   Cardiovascular: Negative for chest pain and palpitations.  Gastrointestinal: Positive for abdominal pain,  diarrhea, nausea and vomiting. Negative for blood in stool.  Genitourinary: Negative for dysuria, frequency and hematuria.  Musculoskeletal: Negative for arthralgias, back pain and myalgias.  Skin: Negative for rash.  Neurological: Negative for dizziness, light-headedness and headaches.     Physical Exam Triage Vital Signs ED Triage Vitals  Enc Vitals Group     BP 04/19/19 1138 110/76     Pulse Rate 04/19/19 1138 98     Resp 04/19/19 1138 16     Temp 04/19/19 1138 98.7 F (37.1 C)  Temp Source 04/19/19 1138 Oral     SpO2 04/19/19 1138 96 %     Weight 04/19/19 1139 213 lb 13.5 oz (97 kg)     Height 04/19/19 1139 '5\' 7"'  (1.702 m)     Head Circumference --      Peak Flow --      Pain Score 04/19/19 1138 1     Pain Loc --      Pain Edu? --      Excl. in [place]? --    No data found.  Updated Vital Signs BP 110/76 (BP Location: Right Arm)   Pulse 98   Temp 98.7 F (37.1 C) (Oral)   Resp 16   Ht '5\' 7"'  (1.702 m)   Wt 213 lb 13.5 oz (97 kg)   SpO2 96%   BMI 33.49 kg/m   Visual Acuity Right Eye Distance:   Left Eye Distance:   Bilateral Distance:    Right Eye Near:   Left Eye Near:    Bilateral Near:     Physical Exam Vitals signs and nursing note reviewed.  Constitutional:      Appearance: She is well-developed.  HENT:     Head: Normocephalic and atraumatic.     Right Ear: Tympanic membrane normal.     Left Ear: Tympanic membrane normal.     Nose: Nose normal.     Mouth/Throat:     Lips: Pink.     Mouth: Mucous membranes are moist.     Pharynx: Oropharynx is clear. Uvula midline.  Neck:     Musculoskeletal: Normal range of motion.  Cardiovascular:     Rate and Rhythm: Normal rate and regular rhythm.  Pulmonary:     Effort: Pulmonary effort is normal.     Breath sounds: Normal breath sounds.  Abdominal:     Palpations: Abdomen is soft.     Tenderness: There is no abdominal tenderness. There is no right CVA tenderness or left CVA tenderness.   Musculoskeletal: Normal range of motion.  Skin:    General: Skin is warm and dry.  Neurological:     Mental Status: She is alert and oriented to person, place, and time.  Psychiatric:        Behavior: Behavior normal.      UC Treatments / Results  Labs (all labs ordered are listed, but only abnormal results are displayed) Labs Reviewed  COMPLETE METABOLIC PANEL WITH GFR  POCT CBC W AUTO DIFF ([facility])    EKG None  Radiology No results found.  Procedures Procedures (including critical care time)  Medications Ordered in UC Medications - No data to display  Initial Impression / Assessment and Plan / UC Course  I have reviewed the triage vital signs and the nursing notes.  Pertinent labs & imaging results that were available during my care of the patient were reviewed by me and considered in my medical decision making (see chart for details).    Normal vitals Benign abdominal exam. No evidence of underlying bacterial infection or urgent/emergent process taking place. Encouraged symptomatic tx   Final Clinical Impressions(s) / UC Diagnoses   Final diagnoses:  Nausea vomiting and diarrhea  Abdominal cramping     Discharge Instructions     You may continue to take the anti-nausea medication as prescribed by your family doctor as needed for nausea and vomiting.  Be sure to get a lot of rest and stay well hydrated with sports drinks, water, diluted juices, and clear sodas.  Avoid fried fatty food, spicy food, and milk as these foods can cause worsening stomach upset.   Please call Monday to schedule a follow up appointment with your family doctor if not improving.  You may also let your family doctor know via MyChart that you had CBC and a CMP blood work performed today.    Call 911 or go to the hospital if you develop worsening symptoms- worsening pain, unable to keep down fluids, dizziness, passing out, or other new concerning symptoms develop.     ED Prescriptions    None     Controlled Substance Prescriptions [place] Controlled Substance Registry consulted? Not Applicable   Tyrell Antonio 04/19/19 1327

## 2019-04-19 NOTE — Discharge Instructions (Signed)
You may continue to take the anti-nausea medication as prescribed by your family doctor as needed for nausea and vomiting.  Be sure to get a lot of rest and stay well hydrated with sports drinks, water, diluted juices, and clear sodas.  Avoid fried fatty food, spicy food, and milk as these foods can cause worsening stomach upset.   Please call Monday to schedule a follow up appointment with your family doctor if not improving.  You may also let your family doctor know via MyChart that you had CBC and a CMP blood work performed today.    Call 911 or go to the hospital if you develop worsening symptoms- worsening pain, unable to keep down fluids, dizziness, passing out, or other new concerning symptoms develop.

## 2019-04-20 ENCOUNTER — Telehealth: Payer: Self-pay

## 2019-04-20 NOTE — Telephone Encounter (Signed)
Left message for pt with normal lab results,  and contact information for any questions or concerns.

## 2019-04-21 ENCOUNTER — Ambulatory Visit (INDEPENDENT_AMBULATORY_CARE_PROVIDER_SITE_OTHER): Payer: BLUE CROSS/BLUE SHIELD | Admitting: Family Medicine

## 2019-04-21 ENCOUNTER — Encounter: Payer: Self-pay | Admitting: Family Medicine

## 2019-04-21 VITALS — BP 128/74 | HR 91 | Ht 67.0 in | Wt 216.0 lb

## 2019-04-21 DIAGNOSIS — K529 Noninfective gastroenteritis and colitis, unspecified: Secondary | ICD-10-CM

## 2019-04-21 DIAGNOSIS — R319 Hematuria, unspecified: Secondary | ICD-10-CM | POA: Diagnosis not present

## 2019-04-21 DIAGNOSIS — R300 Dysuria: Secondary | ICD-10-CM

## 2019-04-21 DIAGNOSIS — R1030 Lower abdominal pain, unspecified: Secondary | ICD-10-CM | POA: Diagnosis not present

## 2019-04-21 LAB — POCT URINALYSIS DIPSTICK
Bilirubin, UA: NEGATIVE
Glucose, UA: NEGATIVE
Leukocytes, UA: NEGATIVE
Nitrite, UA: NEGATIVE
Protein, UA: POSITIVE — AB
Spec Grav, UA: <=1.005 — AB (ref 1.010–1.025)
Urobilinogen, UA: 0.2 E.U./dL
pH, UA: 6 (ref 5.0–8.0)

## 2019-04-21 NOTE — Progress Notes (Signed)
Acute Office Visit  Subjective:    Patient ID: Nicole Mata, female    DOB: 1961/08/19, 58 y.o.   MRN: [id number]  Chief Complaint  Patient presents with  . Urinary Tract Infection    x 4 days    HPI Patient is in today for nausea vomiting and diarrhea.  He originally presented to the urgent care 2 days ago on May 16 after having a couple of days of watery diarrhea particularly in the morning and with an episode of vomiting.  She also had persistent nausea for several days.  She had taken some Zofran and Imodium to help.  She had had [employer] chicken the night before but nobody else has been sick in her home.  She denies any fevers chills or sweats.  She says that today is the first day that she does not feel extremely nauseated and she has not vomited today.  Last loose watery bowel movement was this morning.  She came in today in particular even though she was feeling a little bit better because she started to develop some pelvic pressure and discomfort.  She does have a history of kidney stones and has had similar pain before though no recent back pain except for just some mild discomfort from lying in bed.  She denies any gross hematuria.  She has had some frequency and urgency today.  Again no fevers chills or sweats.  Past Medical History:  Diagnosis Date  . Ankle fracture, right 04/12/13  . Diabetes mellitus without complication ([place])   . Diverticulosis   . Gastropathy    reactive  . GERD (gastroesophageal reflux disease)   . Helicobacter pylori gastritis   . Hiatal hernia   . IBS (irritable bowel syndrome)   . Ileitis   . PONV (postoperative nausea and vomiting)     Past Surgical History:  Procedure Laterality Date  . CHOLECYSTECTOMY  2001  . LAPAROSCOPY  11/19/2012   Procedure: LAPAROSCOPY DIAGNOSTIC;  Surgeon: Joyice Faster. Cornett, MD;  Location: [facility];  Service: General;  Laterality: N/A;  Diagnostic laparoscopy  . SHOULDER SURGERY  2002, 2003   bilateral for bone  spurs and torn RC  . UMBILICAL HERNIA REPAIR  11/19/2012   Procedure: LAPAROSCOPIC UMBILICAL HERNIA;  Surgeon: Joyice Faster. Cornett, MD;  Location: [facility];  Service: General;  Laterality: N/A;  ventral hernia repair with mesh    Family History  Problem Relation Age of Onset  . Heart disease Other        family disease  . Heart attack Father 63       grandparents  . Hypertension Father   . Hyperlipidemia Father   . Diabetes Maternal Grandmother   . Diabetes Maternal Uncle   . Ovarian cancer Maternal Aunt        x 2  . Hypertension Mother   . Hyperlipidemia Mother   . Diabetes type II Mother   . Diabetes Brother   . Ovarian cancer Maternal Aunt   . Colon cancer Neg Hx     Social History   Socioeconomic History  . Marital status: Married    Spouse name: Not on file  . Number of children: 0  . Years of education: Not on file  . Highest education level: Not on file  Occupational History  . 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 Financial resource strain: Not on file  . Food insecurity:    Worry: Not on file    Inability: Not on  file  . Transportation needs:    Medical: Not on file    Non-medical: Not on file  Tobacco Use  . Smoking status: Never Smoker  . Smokeless tobacco: Never Used  Substance and Sexual Activity  . Alcohol use: Yes    Comment: very rarely  . Drug use: No  . Sexual activity: Yes    Partners: Male    Comment: divorced, no kids, customer service, 2 caffeine drinks daily.  Lifestyle  . Physical activity:    Days per week: Not on file    Minutes per session: Not on file  . Stress: Not on file  Relationships  . Social connections:    Talks on phone: Not on file    Gets together: Not on file    Attends religious service: Not on file    Active member of club or organization: Not on file    Attends meetings of clubs or organizations: Not on file    Relationship status: Not on file  . Intimate partner violence:    Fear of current or ex partner:  Not on file    Emotionally abused: Not on file    Physically abused: Not on file    Forced sexual activity: Not on file  Other Topics Concern  . Not on file  Social History Narrative   1 can of soda a day.  No regular exercise.     Outpatient Medications Prior to Visit  Medication Sig Dispense Refill  . Blood Glucose Monitoring Suppl (BAYER CONTOUR MONITOR) w/Device KIT Use to check blood sugar one time daily. Dx: E11.65. DUE FOR FOLLOW UP 1 kit 0  . DEXILANT 60 MG capsule Take 1 capsule (60 mg total) by mouth daily before breakfast. 30 capsule 10  . fluticasone (FLONASE) 50 MCG/ACT nasal spray Place 2 sprays into the nose daily. 16 g 2  . glipiZIDE (GLUCOTROL) 10 MG tablet Take 10 mg by mouth 2 (two) times daily before a meal.    . glucose blood (BAYER CONTOUR TEST) test strip For checking blood sugar once daily. Dx:E11.9 100 each 11  . Insulin Pen Needle (B-D ULTRAFINE III SHORT PEN) 31G X 8 MM MISC INJECT INTO THE SKIN ONCE DAILY AS DIRECTED 100 each PRN  . JANUMET XR [phone] MG TB24 TAKE 1 TABLET BY MOUTH DAILY 90 tablet 1  . ketotifen (ZADITOR) 0.025 % ophthalmic solution Place 1 drop into both eyes every morning.    . Microlet Lancets MISC USE TO CHECK SUGAR ONCE DAILY 100 each 6  . ondansetron (ZOFRAN-ODT) 4 MG disintegrating tablet Take 1 tablet (4 mg total) by mouth every 8 (eight) hours as needed for nausea or vomiting. 15 tablet 0  . pregabalin (LYRICA) 25 MG capsule Take 1 capsule (25 mg total) by mouth 2 (two) times daily. 180 capsule 3  . Semaglutide,0.25 or 0.5MG/DOS, (OZEMPIC, 0.25 OR 0.5 MG/DOSE,) 2 MG/1.5ML SOPN Inject 0.25 mg into the skin once a week. Then increase to 0.77m weekly 4 pen 2   No facility-administered medications prior to visit.     Allergies  Allergen Reactions  . Penicillins Anaphylaxis  . Sulfa Drugs Cross Reactors Anaphylaxis  . Contrast Media [Iodinated Diagnostic Agents] Hives  . Atorvastatin Other (See Comments)    Muscle aches  .  Gabapentin Other (See Comments)    GERD  . Januvia [Sitagliptin] Other (See Comments)    Hot flashes and flushed and dizzy  . Morphine And Related Nausea And Vomiting    Pt  chooses not to take it  . Prednisone     Other reaction(s): Confusion  . Azithromycin Other (See Comments)    Other reaction(s): Other Makes her feel really crazy. Makes her feel really crazy.  Marland Kitchen Hydrocodone Nausea And Vomiting  . Invokana [Canagliflozin] Rash    Rash starting on her face    ROS     Objective:    Physical Exam  Constitutional: She is oriented to person, place, and time. She appears well-developed and well-nourished.  HENT:  Head: Normocephalic and atraumatic.  Cardiovascular: Normal rate, regular rhythm and normal heart sounds.  Pulmonary/Chest: Effort normal and breath sounds normal.  Abdominal: Soft. Bowel sounds are normal. She exhibits no distension and no mass. There is abdominal tenderness. There is no rebound and no guarding.  Mild tenderness in the right and left lower quadrants as well as suprapubically.  Neurological: She is alert and oriented to person, place, and time.  Skin: Skin is warm and dry.  Psychiatric: She has a normal mood and affect. Her behavior is normal.    BP 128/74   Pulse 91   Ht _0  (1.702 m)   Wt 216 lb (98 kg)   SpO2 100%   BMI 33.83 kg/m  Wt Readings from Last 3 Encounters:  04/21/19 216 lb (98 kg)  04/19/19 213 lb 13.5 oz (97 kg)  04/14/19 216 lb (98 kg)    There are no preventive care reminders to display for this patient.  There are no preventive care reminders to display for this patient.   Lab Results  Component Value Date   TSH 1.316 10/13/2013   Lab Results  Component Value Date   WBC 9.8 12/14/2017   HGB 13.2 12/14/2017   HCT 38.2 12/14/2017   MCV 85.8 12/14/2017   PLT 395 12/14/2017   Lab Results  Component Value Date   NA 138 04/19/2019   K 4.2 04/19/2019   CO2 27 04/19/2019   GLUCOSE 151 (H) 04/19/2019   BUN 13  04/19/2019   CREATININE 0.81 04/19/2019   BILITOT 0.8 04/19/2019   ALKPHOS 74 07/18/2017   AST 14 04/19/2019   ALT 11 04/19/2019   PROT 7.6 04/19/2019   ALBUMIN 3.9 07/18/2017   CALCIUM 9.5 04/19/2019   Lab Results  Component Value Date   CHOL 162 06/20/2018   Lab Results  Component Value Date   HDL 44 (L) 06/20/2018   Lab Results  Component Value Date   LDLCALC 98 06/20/2018   Lab Results  Component Value Date   TRIG 104 06/20/2018   Lab Results  Component Value Date   CHOLHDL 3.7 06/20/2018   Lab Results  Component Value Date   HGBA1C 7.0 (A) 04/14/2019       Assessment & Plan:   Problem List Items Addressed This Visit    None    Visit Diagnoses    Dysuria    -  Primary   Relevant Orders   POCT urinalysis dipstick (Completed)   Urine Culture   Hematuria, unspecified type       Relevant Orders   Urine Culture   Lower abdominal pain       Gastroenteritis         Lower abdominal pain-unclear etiology consider cystitis versus just colitis causing discomfort.  Did send urinalysis for culture though leukocytes and nitrites were negative though she did have some blood.  Also consider kidney stone though her pain really is all the way across the lower abdomen  and typically it can be either more one-sided or suprapubic if the stone has already made it to the bladder.  Asked her to call back if she notices any change or worsening in symptoms.  Hematuria, microscopic-again we will send urine for culture to look for UTI which may have developed secondary to the diarrhea.  Gastroenteritis, suspect viral based on her description and she is feeling better today in regards to the nausea and vomiting.  Still having some loose watery stools.  That should subside in the next couple of days as well.  Encouraged her to continue to work on hydration and advance diet very slowly and as tolerated.  No orders of the defined types were placed in this encounter.    Beatrice Lecher, MD

## 2019-04-23 DIAGNOSIS — E1122 Type 2 diabetes mellitus with diabetic chronic kidney disease: Secondary | ICD-10-CM | POA: Diagnosis not present

## 2019-04-23 DIAGNOSIS — Z885 Allergy status to narcotic agent status: Secondary | ICD-10-CM | POA: Diagnosis not present

## 2019-04-23 DIAGNOSIS — Z7984 Long term (current) use of oral hypoglycemic drugs: Secondary | ICD-10-CM | POA: Diagnosis not present

## 2019-04-23 DIAGNOSIS — K219 Gastro-esophageal reflux disease without esophagitis: Secondary | ICD-10-CM | POA: Diagnosis not present

## 2019-04-23 DIAGNOSIS — Z881 Allergy status to other antibiotic agents status: Secondary | ICD-10-CM | POA: Diagnosis not present

## 2019-04-23 DIAGNOSIS — Z880 Allergy status to penicillin: Secondary | ICD-10-CM | POA: Diagnosis not present

## 2019-04-23 DIAGNOSIS — Z79899 Other long term (current) drug therapy: Secondary | ICD-10-CM | POA: Diagnosis not present

## 2019-04-23 DIAGNOSIS — E11649 Type 2 diabetes mellitus with hypoglycemia without coma: Secondary | ICD-10-CM | POA: Diagnosis not present

## 2019-04-23 DIAGNOSIS — N200 Calculus of kidney: Secondary | ICD-10-CM | POA: Diagnosis not present

## 2019-04-23 DIAGNOSIS — Z888 Allergy status to other drugs, medicaments and biological substances status: Secondary | ICD-10-CM | POA: Diagnosis not present

## 2019-04-23 DIAGNOSIS — N3000 Acute cystitis without hematuria: Secondary | ICD-10-CM | POA: Diagnosis not present

## 2019-04-23 LAB — URINE CULTURE
MICRO NUMBER:: 483286
Result:: NO GROWTH
SPECIMEN QUALITY:: ADEQUATE

## 2019-05-01 ENCOUNTER — Other Ambulatory Visit: Payer: Self-pay | Admitting: Family Medicine

## 2019-05-01 ENCOUNTER — Other Ambulatory Visit: Payer: Self-pay | Admitting: *Deleted

## 2019-05-01 MED ORDER — PREGABALIN 25 MG PO CAPS: 25.0000 mg | ORAL_CAPSULE | Freq: Two times a day (BID) | ORAL | 3 refills | Status: DC

## 2019-05-16 ENCOUNTER — Telehealth: Payer: Self-pay | Admitting: Family Medicine

## 2019-05-16 NOTE — Telephone Encounter (Signed)
Call patient: Received notification from [facility] that her Dexilant is not covered by her current insurance plan.  Would she like [place] to move forward with a request for a prior authorization through her insurance.

## 2019-05-19 NOTE — Telephone Encounter (Signed)
Approved today (Dexilant) Effective from 05/19/2019 through 05/17/2022. Pharmacy aware.

## 2019-05-19 NOTE — Telephone Encounter (Signed)
Left pt msg asking to call back advising whether she wants PA

## 2019-05-19 NOTE — Telephone Encounter (Signed)
Patient called back, asked PA please be done. Note to [place]. Thanks.

## 2019-06-19 DIAGNOSIS — R339 Retention of urine, unspecified: Secondary | ICD-10-CM | POA: Diagnosis not present

## 2019-06-19 DIAGNOSIS — N23 Unspecified renal colic: Secondary | ICD-10-CM | POA: Diagnosis not present

## 2019-06-19 DIAGNOSIS — Z7984 Long term (current) use of oral hypoglycemic drugs: Secondary | ICD-10-CM | POA: Diagnosis not present

## 2019-06-19 DIAGNOSIS — N189 Chronic kidney disease, unspecified: Secondary | ICD-10-CM | POA: Diagnosis not present

## 2019-06-19 DIAGNOSIS — E1122 Type 2 diabetes mellitus with diabetic chronic kidney disease: Secondary | ICD-10-CM | POA: Diagnosis not present

## 2019-06-19 DIAGNOSIS — R109 Unspecified abdominal pain: Secondary | ICD-10-CM | POA: Diagnosis not present

## 2019-06-19 DIAGNOSIS — Z79899 Other long term (current) drug therapy: Secondary | ICD-10-CM | POA: Diagnosis not present

## 2019-06-19 DIAGNOSIS — K219 Gastro-esophageal reflux disease without esophagitis: Secondary | ICD-10-CM | POA: Diagnosis not present

## 2019-07-01 ENCOUNTER — Ambulatory Visit (INDEPENDENT_AMBULATORY_CARE_PROVIDER_SITE_OTHER): Payer: BC Managed Care – PPO | Admitting: Family Medicine

## 2019-07-01 ENCOUNTER — Ambulatory Visit: Payer: BLUE CROSS/BLUE SHIELD | Admitting: Family Medicine

## 2019-07-01 ENCOUNTER — Encounter: Payer: Self-pay | Admitting: Family Medicine

## 2019-07-01 ENCOUNTER — Other Ambulatory Visit: Payer: Self-pay

## 2019-07-01 VITALS — BP 132/74 | HR 71 | Ht 67.0 in | Wt 204.0 lb

## 2019-07-01 DIAGNOSIS — N200 Calculus of kidney: Secondary | ICD-10-CM | POA: Diagnosis not present

## 2019-07-01 DIAGNOSIS — E1165 Type 2 diabetes mellitus with hyperglycemia: Secondary | ICD-10-CM

## 2019-07-01 DIAGNOSIS — E7800 Pure hypercholesterolemia, unspecified: Secondary | ICD-10-CM | POA: Diagnosis not present

## 2019-07-01 DIAGNOSIS — J0100 Acute maxillary sinusitis, unspecified: Secondary | ICD-10-CM | POA: Diagnosis not present

## 2019-07-01 DIAGNOSIS — E119 Type 2 diabetes mellitus without complications: Secondary | ICD-10-CM | POA: Diagnosis not present

## 2019-07-01 MED ORDER — ONDANSETRON 4 MG PO TBDP: 4.0000 mg | ORAL_TABLET | Freq: Three times a day (TID) | ORAL | 0 refills | Status: DC | PRN

## 2019-07-01 MED ORDER — DOXYCYCLINE HYCLATE 100 MG PO TABS: 100.0000 mg | ORAL_TABLET | Freq: Two times a day (BID) | ORAL | 0 refills | Status: DC

## 2019-07-01 NOTE — Assessment & Plan Note (Signed)
Has consultation with urology next week.

## 2019-07-01 NOTE — Assessment & Plan Note (Signed)
Keep follow-up in 1 month for next hemoglobin A1c.

## 2019-07-01 NOTE — Progress Notes (Signed)
Acute Office Visit  Subjective:    Patient ID: Nicole Mata, female    DOB: 01/19/1961, 58 y.o.   MRN: [id number]  Chief Complaint  Patient presents with  . Sinusitis    on and off x 1 month facial pressure/pain, using flonase and OTC allergy med. denies f/s/c/n/v/d    HPI Patient is in today for Head and ear pressure. on and off x 1 month facial pressure/pain, using flonase and OTC allergy med. denies f/s/c/n/v/d.  No sore throat but has had a lot of postnasal drip.  She is had a lot of pressure and popping in her ears.  Some pain over the maxillary sinuses bilaterally.  She is waxing and waning in intensity but is been going on for months now.  Seems like her allergy medicines really are not providing any significant relief.  No GI symptoms.  Hyperlipidemia-not currently on a statin because of intolerance.  Due to recheck lipids.  Diabetes-she did want let me know she had a couple episodes where her sugar dropped pretty quickly even after eating.  She was not sure if it may be of been related to an infection shortly on May 20 and in fact she went to [place] emergency department to be evaluated.  At that time she was diagnosed with a bladder infection.  And she went back to the ED again on July 16 for pain that they felt was probably coming from kidney stones.  She wanted let me know she does have a follow-up coming up with urology in about a week.  Past Medical History:  Diagnosis Date  . Ankle fracture, right 04/12/13  . Diabetes mellitus without complication ([place])   . Diverticulosis   . Gastropathy    reactive  . GERD (gastroesophageal reflux disease)   . Helicobacter pylori gastritis   . Hiatal hernia   . IBS (irritable bowel syndrome)   . Ileitis   . PONV (postoperative nausea and vomiting)     Past Surgical History:  Procedure Laterality Date  . CHOLECYSTECTOMY  2001  . LAPAROSCOPY  11/19/2012   Procedure: LAPAROSCOPY DIAGNOSTIC;  Surgeon: Joyice Faster. Cornett, MD;   Location: [facility];  Service: General;  Laterality: N/A;  Diagnostic laparoscopy  . SHOULDER SURGERY  2002, 2003   bilateral for bone spurs and torn RC  . UMBILICAL HERNIA REPAIR  11/19/2012   Procedure: LAPAROSCOPIC UMBILICAL HERNIA;  Surgeon: Joyice Faster. Cornett, MD;  Location: [facility];  Service: General;  Laterality: N/A;  ventral hernia repair with mesh    Family History  Problem Relation Age of Onset  . Heart disease Other        family disease  . Heart attack Father 58       grandparents  . Hypertension Father   . Hyperlipidemia Father   . Diabetes Maternal Grandmother   . Diabetes Maternal Uncle   . Ovarian cancer Maternal Aunt        x 2  . Hypertension Mother   . Hyperlipidemia Mother   . Diabetes type II Mother   . Diabetes Brother   . Ovarian cancer Maternal Aunt   . Colon cancer Neg Hx     Social History   Socioeconomic History  . Marital status: Married    Spouse name: Not on file  . Number of children: 0  . Years of education: Not on file  . Highest education level: Not on file  Occupational History  . 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 Financial resource strain: Not on file  . Food insecurity    Worry: Not on file    Inability: Not on file  . Transportation needs    Medical: Not on file    Non-medical: Not on file  Tobacco Use  . Smoking status: Never Smoker  . Smokeless tobacco: Never Used  Substance and Sexual Activity  . Alcohol use: Yes    Comment: very rarely  . Drug use: No  . Sexual activity: Yes    Partners: Male    Comment: divorced, no kids, customer service, 2 caffeine drinks daily.  Lifestyle  . Physical activity    Days per week: Not on file    Minutes per session: Not on file  . Stress: Not on file  Relationships  . Social Herbalist on phone: Not on file    Gets together: Not on file    Attends religious service: Not on file    Active member of club or organization: Not on file    Attends meetings of clubs or  organizations: Not on file    Relationship status: Not on file  . Intimate partner violence    Fear of current or ex partner: Not on file    Emotionally abused: Not on file    Physically abused: Not on file    Forced sexual activity: Not on file  Other Topics Concern  . Not on file  Social History Narrative   1 can of soda a day.  No regular exercise.     Outpatient Medications Prior to Visit  Medication Sig Dispense Refill  . Blood Glucose Monitoring Suppl (BAYER CONTOUR MONITOR) w/Device KIT Use to check blood sugar one time daily. Dx: E11.65. DUE FOR FOLLOW UP 1 kit 0  . DEXILANT 60 MG capsule Take 1 capsule (60 mg total) by mouth daily before breakfast. 30 capsule 10  . fluticasone (FLONASE) 50 MCG/ACT nasal spray Place 2 sprays into the nose daily. 16 g 2  . glipiZIDE (GLUCOTROL) 10 MG tablet Take 10 mg by mouth 2 (two) times daily before a meal.    . glucose blood (BAYER CONTOUR TEST) test strip For checking blood sugar once daily. Dx:E11.9 100 each 11  . Insulin Pen Needle (B-D ULTRAFINE III SHORT PEN) 31G X 8 MM MISC INJECT INTO THE SKIN ONCE DAILY AS DIRECTED 100 each PRN  . JANUMET XR [phone] MG TB24 TAKE 1 TABLET BY MOUTH DAILY 90 tablet 1  . ketotifen (ZADITOR) 0.025 % ophthalmic solution Place 1 drop into both eyes every morning.    . Microlet Lancets MISC USE TO CHECK SUGAR ONCE DAILY 100 each 6  . pregabalin (LYRICA) 25 MG capsule Take 1 capsule (25 mg total) by mouth 2 (two) times daily. 180 capsule 3  . Semaglutide,0.25 or 0.5MG/DOS, (OZEMPIC, 0.25 OR 0.5 MG/DOSE,) 2 MG/1.5ML SOPN Inject 0.25 mg into the skin once a week. Then increase to 0.30m weekly 4 pen 2  . ondansetron (ZOFRAN-ODT) 4 MG disintegrating tablet Take 1 tablet (4 mg total) by mouth every 8 (eight) hours as needed for nausea or vomiting. 15 tablet 0   No facility-administered medications prior to visit.     Allergies  Allergen Reactions  . Penicillins Anaphylaxis  . Sulfa Drugs Cross Reactors  Anaphylaxis  . Contrast Media [Iodinated Diagnostic Agents] Hives  . Atorvastatin Other (See Comments)    Muscle aches  . Gabapentin Other (See Comments)    GERD  .  Januvia [Sitagliptin] Other (See Comments)    Hot flashes and flushed and dizzy  . Morphine And Related Nausea And Vomiting    Pt chooses not to take it  . Prednisone     Other reaction(s): Confusion  . Azithromycin Other (See Comments)    Other reaction(s): Other Makes her feel really crazy. Makes her feel really crazy.  Marland Kitchen Hydrocodone Nausea And Vomiting  . Invokana [Canagliflozin] Rash    Rash starting on her face    ROS     Objective:    Physical Exam  Constitutional: She is oriented to person, place, and time. She appears well-developed and well-nourished.  HENT:  Head: Normocephalic and atraumatic.  Right Ear: External ear normal.  Left Ear: External ear normal.  Nose: Nose normal.  Mouth/Throat: Oropharynx is clear and moist.  TMs and canals are clear.   Eyes: Pupils are equal, round, and reactive to light. Conjunctivae and EOM are normal.  slcera is injected.   Neck: Neck supple. No thyromegaly present.  Cardiovascular: Normal rate, regular rhythm and normal heart sounds.  Pulmonary/Chest: Effort normal and breath sounds normal. She has no wheezes.  Lymphadenopathy:    She has no cervical adenopathy.  Neurological: She is alert and oriented to person, place, and time.  Skin: Skin is warm and dry.  Psychiatric: She has a normal mood and affect.    BP 132/74   Pulse 71   Ht '5\' 7"'  (1.702 m)   Wt 204 lb (92.5 kg)   SpO2 100%   BMI 31.95 kg/m  Wt Readings from Last 3 Encounters:  07/01/19 204 lb (92.5 kg)  04/21/19 216 lb (98 kg)  04/19/19 213 lb 13.5 oz (97 kg)    Health Maintenance Due  Topic Date Due  . HIV Screening  07/02/1978    There are no preventive care reminders to display for this patient.   Lab Results  Component Value Date   TSH 1.316 10/13/2013   Lab Results   Component Value Date   WBC 9.8 12/14/2017   HGB 13.2 12/14/2017   HCT 38.2 12/14/2017   MCV 85.8 12/14/2017   PLT 395 12/14/2017   Lab Results  Component Value Date   NA 138 04/19/2019   K 4.2 04/19/2019   CO2 27 04/19/2019   GLUCOSE 151 (H) 04/19/2019   BUN 13 04/19/2019   CREATININE 0.81 04/19/2019   BILITOT 0.8 04/19/2019   ALKPHOS 74 07/18/2017   AST 14 04/19/2019   ALT 11 04/19/2019   PROT 7.6 04/19/2019   ALBUMIN 3.9 07/18/2017   CALCIUM 9.5 04/19/2019   Lab Results  Component Value Date   CHOL 162 06/20/2018   Lab Results  Component Value Date   HDL 44 (L) 06/20/2018   Lab Results  Component Value Date   LDLCALC 98 06/20/2018   Lab Results  Component Value Date   TRIG 104 06/20/2018   Lab Results  Component Value Date   CHOLHDL 3.7 06/20/2018   Lab Results  Component Value Date   HGBA1C 7.0 (A) 04/14/2019       Assessment & Plan:   Problem List Items Addressed This Visit      Endocrine   Type II diabetes mellitus, uncontrolled ([place])    Keep follow-up in 1 month for next hemoglobin A1c.        Genitourinary   Kidney stones    Has consultation with urology next week.        Other   Hyperlipidemia  Relevant Orders   COMPLETE METABOLIC PANEL WITH GFR   Lipid panel    Other Visit Diagnoses    Acute non-recurrent maxillary sinusitis    -  Primary   Relevant Medications   doxycycline (VIBRA-TABS) 100 MG tablet   Type 2 diabetes mellitus without complication, without long-term current use of insulin ([facility])       Relevant Orders   COMPLETE METABOLIC PANEL WITH GFR   Lipid panel     Acute sinusitis we will go ahead and treat with doxycycline.  Call if not significantly better in 1 week.  She also asked if we could refill her nausea medicine today.   Meds ordered this encounter  Medications  . doxycycline (VIBRA-TABS) 100 MG tablet    Sig: Take 1 tablet (100 mg total) by mouth 2 (two) times daily.    Dispense:  20 tablet     Refill:  0  . ondansetron (ZOFRAN-ODT) 4 MG disintegrating tablet    Sig: Take 1 tablet (4 mg total) by mouth every 8 (eight) hours as needed for nausea or vomiting.    Dispense:  15 tablet    Refill:  0     Beatrice Lecher, MD

## 2019-07-06 DIAGNOSIS — R202 Paresthesia of skin: Secondary | ICD-10-CM | POA: Diagnosis not present

## 2019-07-06 DIAGNOSIS — Z882 Allergy status to sulfonamides status: Secondary | ICD-10-CM | POA: Diagnosis not present

## 2019-07-06 DIAGNOSIS — Z880 Allergy status to penicillin: Secondary | ICD-10-CM | POA: Diagnosis not present

## 2019-07-06 DIAGNOSIS — Z91041 Radiographic dye allergy status: Secondary | ICD-10-CM | POA: Diagnosis not present

## 2019-07-06 DIAGNOSIS — R42 Dizziness and giddiness: Secondary | ICD-10-CM | POA: Diagnosis not present

## 2019-07-06 DIAGNOSIS — Z792 Long term (current) use of antibiotics: Secondary | ICD-10-CM | POA: Diagnosis not present

## 2019-07-06 DIAGNOSIS — Z885 Allergy status to narcotic agent status: Secondary | ICD-10-CM | POA: Diagnosis not present

## 2019-07-06 DIAGNOSIS — Z888 Allergy status to other drugs, medicaments and biological substances status: Secondary | ICD-10-CM | POA: Diagnosis not present

## 2019-07-08 DIAGNOSIS — M50123 Cervical disc disorder at C6-C7 level with radiculopathy: Secondary | ICD-10-CM | POA: Diagnosis not present

## 2019-07-08 DIAGNOSIS — M542 Cervicalgia: Secondary | ICD-10-CM | POA: Diagnosis not present

## 2019-07-08 DIAGNOSIS — M4722 Other spondylosis with radiculopathy, cervical region: Secondary | ICD-10-CM | POA: Diagnosis not present

## 2019-07-08 DIAGNOSIS — M50122 Cervical disc disorder at C5-C6 level with radiculopathy: Secondary | ICD-10-CM | POA: Diagnosis not present

## 2019-07-09 DIAGNOSIS — N200 Calculus of kidney: Secondary | ICD-10-CM | POA: Diagnosis not present

## 2019-07-14 ENCOUNTER — Encounter: Payer: Self-pay | Admitting: Family Medicine

## 2019-07-14 ENCOUNTER — Other Ambulatory Visit: Payer: Self-pay

## 2019-07-14 ENCOUNTER — Ambulatory Visit (INDEPENDENT_AMBULATORY_CARE_PROVIDER_SITE_OTHER): Payer: BC Managed Care – PPO | Admitting: Family Medicine

## 2019-07-14 VITALS — BP 131/70 | HR 77 | Ht 67.0 in | Wt 200.0 lb

## 2019-07-14 DIAGNOSIS — Z87442 Personal history of urinary calculi: Secondary | ICD-10-CM

## 2019-07-14 DIAGNOSIS — Z6831 Body mass index (BMI) 31.0-31.9, adult: Secondary | ICD-10-CM | POA: Diagnosis not present

## 2019-07-14 DIAGNOSIS — E119 Type 2 diabetes mellitus without complications: Secondary | ICD-10-CM | POA: Diagnosis not present

## 2019-07-14 DIAGNOSIS — E118 Type 2 diabetes mellitus with unspecified complications: Secondary | ICD-10-CM

## 2019-07-14 DIAGNOSIS — M5412 Radiculopathy, cervical region: Secondary | ICD-10-CM

## 2019-07-14 DIAGNOSIS — E7800 Pure hypercholesterolemia, unspecified: Secondary | ICD-10-CM | POA: Diagnosis not present

## 2019-07-14 LAB — POCT GLYCOSYLATED HEMOGLOBIN (HGB A1C): Hemoglobin A1C: 6.4 % — AB (ref 4.0–5.6)

## 2019-07-14 NOTE — Assessment & Plan Note (Signed)
Starting physical therapy tomorrow.  Also on gabapentin.

## 2019-07-14 NOTE — Assessment & Plan Note (Signed)
Following with urology now.  Recently started on allopurinol.

## 2019-07-14 NOTE — Assessment & Plan Note (Signed)
He looks phenomenal today and has improved significantly.  Down to 6.4.

## 2019-07-14 NOTE — Progress Notes (Signed)
Established Patient Office Visit  Subjective:  Patient ID: Nicole Mata, female    DOB: 10-01-1961  Age: 58 y.o. MRN: [id number]  CC:  Chief Complaint  Patient presents with  . Diabetes    HPI Nicole Mata presents for   Diabetes - no hypoglycemic events. No wounds or sores that are not healing well. No increased thirst or urination. Checking glucose at home. Taking medications as prescribed without any side effects.  Because this morning was 108.  And also seen her for sinusitis at the end of July.  Overall she is better but still getting a little intermittent pressure and postnasal drip.  But she thinks it may just be related to the weather.   In regards to her kidney stone she did have a follow-up with urology.  He said that she had urate type stones and had put her on medication for this.  She is now taking allopurinol.  Is also recently started on gabapentin for cervical pain.  She has cervical radiculopathy at C7.  They are also going to put her in physical therapy and her first appointment is tomorrow and then she is a 4-week follow-up with the orthopedist to go over how well she is doing.  She was also given prednisone but says she has not started it yet.  She wanted to wait until after she had her A1c done today.  They are considering MRI at her follow-up depending on how she responds to therapy.  In regards to her weight she has lost another 4 pounds which is absolutely phenomenal.  She is been doing really well.  He has not but definitely decreases her appetite and says that has been helping.  Past Medical History:  Diagnosis Date  . Ankle fracture, right 04/12/13  . Diabetes mellitus without complication ([place])   . Diverticulosis   . Gastropathy    reactive  . GERD (gastroesophageal reflux disease)   . Helicobacter pylori gastritis   . Hiatal hernia   . IBS (irritable bowel syndrome)   . Ileitis   . PONV (postoperative nausea and vomiting)     Past Surgical  History:  Procedure Laterality Date  . CHOLECYSTECTOMY  2001  . LAPAROSCOPY  11/19/2012   Procedure: LAPAROSCOPY DIAGNOSTIC;  Surgeon: Joyice Faster. Cornett, MD;  Location: [facility];  Service: General;  Laterality: N/A;  Diagnostic laparoscopy  . SHOULDER SURGERY  2002, 2003   bilateral for bone spurs and torn RC  . UMBILICAL HERNIA REPAIR  11/19/2012   Procedure: LAPAROSCOPIC UMBILICAL HERNIA;  Surgeon: Joyice Faster. Cornett, MD;  Location: [facility];  Service: General;  Laterality: N/A;  ventral hernia repair with mesh    Family History  Problem Relation Age of Onset  . Heart disease Other        family disease  . Heart attack Father 44       grandparents  . Hypertension Father   . Hyperlipidemia Father   . Diabetes Maternal Grandmother   . Diabetes Maternal Uncle   . Ovarian cancer Maternal Aunt        x 2  . Hypertension Mother   . Hyperlipidemia Mother   . Diabetes type II Mother   . Diabetes Brother   . Ovarian cancer Maternal Aunt   . Colon cancer Neg Hx     Social History   Socioeconomic History  . Marital status: Married    Spouse name: Not on file  . Number of children: 0  .  Years of education: Not on file  . Highest education level: Not on file  Occupational History  . 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 Financial resource strain: Not on file  . Food insecurity    Worry: Not on file    Inability: Not on file  . Transportation needs    Medical: Not on file    Non-medical: Not on file  Tobacco Use  . Smoking status: Never Smoker  . Smokeless tobacco: Never Used  Substance and Sexual Activity  . Alcohol use: Yes    Comment: very rarely  . Drug use: No  . Sexual activity: Yes    Partners: Male    Comment: divorced, no kids, customer service, 2 caffeine drinks daily.  Lifestyle  . Physical activity    Days per week: Not on file    Minutes per session: Not on file  . Stress: Not on file  Relationships  . Social Herbalist on phone: Not on file     Gets together: Not on file    Attends religious service: Not on file    Active member of club or organization: Not on file    Attends meetings of clubs or organizations: Not on file    Relationship status: Not on file  . Intimate partner violence    Fear of current or ex partner: Not on file    Emotionally abused: Not on file    Physically abused: Not on file    Forced sexual activity: Not on file  Other Topics Concern  . Not on file  Social History Narrative   1 can of soda a day.  No regular exercise.     Outpatient Medications Prior to Visit  Medication Sig Dispense Refill  . allopurinol (ZYLOPRIM) 100 MG tablet Take 1 tablet by mouth daily.    . Blood Glucose Monitoring Suppl (BAYER CONTOUR MONITOR) w/Device KIT Use to check blood sugar one time daily. Dx: E11.65. DUE FOR FOLLOW UP 1 kit 0  . DEXILANT 60 MG capsule Take 1 capsule (60 mg total) by mouth daily before breakfast. 30 capsule 10  . fluticasone (FLONASE) 50 MCG/ACT nasal spray Place 2 sprays into the nose daily. 16 g 2  . gabapentin (NEURONTIN) 300 MG capsule Take 1 capsule by mouth at bedtime.    Marland Kitchen glipiZIDE (GLUCOTROL) 10 MG tablet Take 10 mg by mouth 2 (two) times daily before a meal.    . glucose blood (BAYER CONTOUR TEST) test strip For checking blood sugar once daily. Dx:E11.9 100 each 11  . Insulin Pen Needle (B-D ULTRAFINE III SHORT PEN) 31G X 8 MM MISC INJECT INTO THE SKIN ONCE DAILY AS DIRECTED 100 each PRN  . JANUMET XR [phone] MG TB24 TAKE 1 TABLET BY MOUTH DAILY 90 tablet 1  . ketotifen (ZADITOR) 0.025 % ophthalmic solution Place 1 drop into both eyes every morning.    . Microlet Lancets MISC USE TO CHECK SUGAR ONCE DAILY 100 each 6  . ondansetron (ZOFRAN-ODT) 4 MG disintegrating tablet Take 1 tablet (4 mg total) by mouth every 8 (eight) hours as needed for nausea or vomiting. 15 tablet 0  . Potassium Citrate 15 MEQ (1620 MG) TBCR TK 2 TS PO BID    . predniSONE (STERAPRED UNI-PAK 21 TAB) 5 MG (21) TBPK  tablet     . pregabalin (LYRICA) 25 MG capsule Take 1 capsule (25 mg total) by mouth 2 (two) times daily. 180 capsule 3  .  Semaglutide,0.25 or 0.5MG/DOS, (OZEMPIC, 0.25 OR 0.5 MG/DOSE,) 2 MG/1.5ML SOPN Inject 0.25 mg into the skin once a week. Then increase to 0.21m weekly 4 pen 2  . doxycycline (VIBRA-TABS) 100 MG tablet Take 1 tablet (100 mg total) by mouth 2 (two) times daily. 20 tablet 0   No facility-administered medications prior to visit.     Allergies  Allergen Reactions  . Penicillins Anaphylaxis  . Sulfa Drugs Cross Reactors Anaphylaxis  . Contrast Media [Iodinated Diagnostic Agents] Hives  . Atorvastatin Other (See Comments)    Muscle aches  . Gabapentin Other (See Comments)    GERD  . Januvia [Sitagliptin] Other (See Comments)    Hot flashes and flushed and dizzy  . Morphine And Related Nausea And Vomiting    Pt chooses not to take it  . Prednisone     Other reaction(s): Confusion  . Azithromycin Other (See Comments)    Other reaction(s): Other Makes her feel really crazy. Makes her feel really crazy.  .Marland KitchenHydrocodone Nausea And Vomiting  . Invokana [Canagliflozin] Rash    Rash starting on her face    ROS Review of Systems    Objective:    Physical Exam  Constitutional: She is oriented to person, place, and time. She appears well-developed and well-nourished.  HENT:  Head: Normocephalic and atraumatic.  Cardiovascular: Normal rate, regular rhythm and normal heart sounds.  Pulmonary/Chest: Effort normal and breath sounds normal.  Neurological: She is alert and oriented to person, place, and time.  Skin: Skin is warm and dry.  Psychiatric: She has a normal mood and affect. Her behavior is normal.    BP 131/70   Pulse 77   Ht _0  (1.702 m)   Wt 200 lb (90.7 kg)   SpO2 99%   BMI 31.32 kg/m  Wt Readings from Last 3 Encounters:  07/14/19 200 lb (90.7 kg)  07/01/19 204 lb (92.5 kg)  04/21/19 216 lb (98 kg)     Health Maintenance Due  Topic Date Due   . HIV Screening  07/02/1978  . INFLUENZA VACCINE  07/05/2019    There are no preventive care reminders to display for this patient.  Lab Results  Component Value Date   TSH 1.316 10/13/2013   Lab Results  Component Value Date   WBC 9.8 12/14/2017   HGB 13.2 12/14/2017   HCT 38.2 12/14/2017   MCV 85.8 12/14/2017   PLT 395 12/14/2017   Lab Results  Component Value Date   NA 138 04/19/2019   K 4.2 04/19/2019   CO2 27 04/19/2019   GLUCOSE 151 (H) 04/19/2019   BUN 13 04/19/2019   CREATININE 0.81 04/19/2019   BILITOT 0.8 04/19/2019   ALKPHOS 74 07/18/2017   AST 14 04/19/2019   ALT 11 04/19/2019   PROT 7.6 04/19/2019   ALBUMIN 3.9 07/18/2017   CALCIUM 9.5 04/19/2019   Lab Results  Component Value Date   CHOL 162 06/20/2018   Lab Results  Component Value Date   HDL 44 (L) 06/20/2018   Lab Results  Component Value Date   LDLCALC 98 06/20/2018   Lab Results  Component Value Date   TRIG 104 06/20/2018   Lab Results  Component Value Date   CHOLHDL 3.7 06/20/2018   Lab Results  Component Value Date   HGBA1C 6.4 (A) 07/14/2019      Assessment & Plan:   Problem List Items Addressed This Visit      Endocrine   Controlled diabetes mellitus type  2 with complications [facility])    He looks phenomenal today and has improved significantly.  Down to 6.4.        Nervous and Auditory   Cervical radiculopathy at C7    Starting physical therapy tomorrow.  Also on gabapentin.      Relevant Medications   gabapentin (NEURONTIN) 300 MG capsule     Other   History of kidney stones    Following with urology now.  Recently started on allopurinol.       Other Visit Diagnoses    Type 2 diabetes mellitus without complication, without long-term current use of insulin ([place])    -  Primary   Relevant Orders   POCT glycosylated hemoglobin (Hb A1C) (Completed)   BMI 31.0-31.9,adult         BMI 31-she is done great with the Ozempic so far and is down to 200 pounds.  It is  made a big difference in her A1c which looks absolutely phenomenal today.  No orders of the defined types were placed in this encounter.   Follow-up: Return in about 3 months (around 10/14/2019) for Diabetes follow-up.    Beatrice Lecher, MD

## 2019-07-15 LAB — COMPLETE METABOLIC PANEL WITH GFR
AG Ratio: 1.4 (calc) (ref 1.0–2.5)
ALT: 10 U/L (ref 6–29)
AST: 16 U/L (ref 10–35)
Albumin: 4.1 g/dL (ref 3.6–5.1)
Alkaline phosphatase (APISO): 64 U/L (ref 37–153)
BUN: 15 mg/dL (ref 7–25)
CO2: 31 mmol/L (ref 20–32)
Calcium: 9.5 mg/dL (ref 8.6–10.4)
Chloride: 103 mmol/L (ref 98–110)
Creat: 0.76 mg/dL (ref 0.50–1.05)
GFR, Est African American: 102 mL/min/{1.73_m2} (ref >=60)
GFR, Est Non African American: 88 mL/min/{1.73_m2} (ref >=60)
Globulin: 3 g/dL (calc) (ref 1.9–3.7)
Glucose, Bld: 112 mg/dL — ABNORMAL HIGH (ref 65–99)
Potassium: 4.5 mmol/L (ref 3.5–5.3)
Sodium: 139 mmol/L (ref 135–146)
Total Bilirubin: 0.4 mg/dL (ref 0.2–1.2)
Total Protein: 7.1 g/dL (ref 6.1–8.1)

## 2019-07-15 LAB — LIPID PANEL
Cholesterol: 156 mg/dL (ref <200)
HDL: 43 mg/dL — ABNORMAL LOW (ref >=50)
LDL Cholesterol (Calc): 89 mg/dL (calc)
Non-HDL Cholesterol (Calc): 113 mg/dL (calc) (ref <130)
Total CHOL/HDL Ratio: 3.6 (calc) (ref <5.0)
Triglycerides: 147 mg/dL (ref <150)

## 2019-07-15 NOTE — Progress Notes (Signed)
All labs are normal. 

## 2019-07-16 ENCOUNTER — Telehealth: Payer: Self-pay

## 2019-07-16 NOTE — Telephone Encounter (Signed)
Colleena states she is on a step down prednisone 30 mg decreasing by 5 mg daily for 6 days. She is worried her blood sugar will get out of control. Her fasting blood sugar this morning was 140 mg/dl and random blood sugar was 206 mg/dl. She wanted to know if she should restart her glipizide while taking the prednisone. Please advise.

## 2019-07-16 NOTE — Telephone Encounter (Signed)
Yes please retart the glipizide while on the prednisone. It should help.

## 2019-07-17 NOTE — Telephone Encounter (Signed)
Called patient and notifeid of MD instructions. KG LPN

## 2019-07-24 ENCOUNTER — Telehealth: Payer: Self-pay | Admitting: Family Medicine

## 2019-07-24 ENCOUNTER — Ambulatory Visit (INDEPENDENT_AMBULATORY_CARE_PROVIDER_SITE_OTHER): Payer: BC Managed Care – PPO | Admitting: Family Medicine

## 2019-07-24 DIAGNOSIS — J0101 Acute recurrent maxillary sinusitis: Secondary | ICD-10-CM | POA: Diagnosis not present

## 2019-07-24 DIAGNOSIS — Z789 Other specified health status: Secondary | ICD-10-CM | POA: Diagnosis not present

## 2019-07-24 DIAGNOSIS — Z7409 Other reduced mobility: Secondary | ICD-10-CM | POA: Diagnosis not present

## 2019-07-24 DIAGNOSIS — R531 Weakness: Secondary | ICD-10-CM | POA: Diagnosis not present

## 2019-07-24 DIAGNOSIS — M542 Cervicalgia: Secondary | ICD-10-CM | POA: Diagnosis not present

## 2019-07-24 MED ORDER — AZELASTINE HCL 0.1 % NA SOLN: 2.0000 | Freq: Two times a day (BID) | NASAL | 12 refills | Status: DC

## 2019-07-24 MED ORDER — DOXYCYCLINE HYCLATE 100 MG PO TABS: 100.0000 mg | ORAL_TABLET | Freq: Two times a day (BID) | ORAL | 0 refills | Status: DC

## 2019-07-24 NOTE — Telephone Encounter (Signed)
Patient calls and wants to know if you can call her something in for her sinuses. Having sinus pressure, right ear pressure, drainage and you have no appointments available. Please advise

## 2019-07-24 NOTE — Telephone Encounter (Signed)
Appointment has been made for today. No further questions at this time.  °

## 2019-07-24 NOTE — Telephone Encounter (Signed)
Please put on Dr. Clovis Riley schedule for a virtual.

## 2019-07-24 NOTE — Progress Notes (Signed)
Virtual Visit  via Video Note  I connected with      Nicole Mata by a video enabled telemedicine application and verified that I am speaking with the correct person using two identifiers.   I discussed the limitations of evaluation and management by telemedicine and the availability of in person appointments. The patient expressed understanding and agreed to proceed.  History of Present Illness: Nicole Mata is a 58 y.o. female who would like to discuss sinus pain and pressure.  Patient has a history of recurrent sinusitis and this morning she woke up with right-sided ear pressure and bilateral sinus pressure pain and discharge drainage.  Symptoms are consistent previous episodes of bacterial sinusitis.  She recently was treated with doxycycline for sinus infection by her PCP at the end of July.  She notes her symptoms have returned.  She has been also recently treated with prednisone and notes that they made her feel very jittery and she like to avoid that medication if possible.  She has multiple drug allergies but can tolerate doxycycline quite well.     Observations/Objective: There were no vitals taken for this visit. Wt Readings from Last 5 Encounters:  07/14/19 200 lb (90.7 kg)  07/01/19 204 lb (92.5 kg)  04/21/19 216 lb (98 kg)  04/19/19 213 lb 13.5 oz (97 kg)  04/14/19 216 lb (98 kg)   Exam: Appearance nontoxic appearing no significant visible facial swelling.  No facial rash. Normal Speech.  No hoarse voice quality.  No tachypnea or wheezing.  Lab and Radiology Results No results found for this or any previous visit (from the past 72 hour(s)). No results found.   Assessment and Plan: 58 y.o. female with recurrent sinusitis and otalgia.  Likely bacterial at this point.  Plan to treat empirically with doxycycline as she is tolerated that quite well in the past.  Continue Flonase and Zyrtec.  Add Astelin nasal spray for use as needed.  Recheck if not  improving.  PDMP not reviewed this encounter. No orders of the defined types were placed in this encounter.  No orders of the defined types were placed in this encounter.   Follow Up Instructions:    I discussed the assessment and treatment plan with the patient. The patient was provided an opportunity to ask questions and all were answered. The patient agreed with the plan and demonstrated an understanding of the instructions.   The patient was advised to call back or seek an in-person evaluation if the symptoms worsen or if the condition fails to improve as anticipated.  Time: 15 minutes of intraservice time, with >22 minutes of total time during today's visit.      Historical information moved to improve visibility of documentation.  Past Medical History:  Diagnosis Date  . Ankle fracture, right 04/12/13  . Diabetes mellitus without complication ([place])   . Diverticulosis   . Gastropathy    reactive  . GERD (gastroesophageal reflux disease)   . Helicobacter pylori gastritis   . Hiatal hernia   . IBS (irritable bowel syndrome)   . Ileitis   . PONV (postoperative nausea and vomiting)    Past Surgical History:  Procedure Laterality Date  . CHOLECYSTECTOMY  2001  . LAPAROSCOPY  11/19/2012   Procedure: LAPAROSCOPY DIAGNOSTIC;  Surgeon: Joyice Faster. Cornett, MD;  Location: [facility];  Service: General;  Laterality: N/A;  Diagnostic laparoscopy  . SHOULDER SURGERY  2002, 2003   bilateral for bone spurs and torn RC  .  UMBILICAL HERNIA REPAIR  11/19/2012   Procedure: LAPAROSCOPIC UMBILICAL HERNIA;  Surgeon: Joyice Faster. Cornett, MD;  Location: [facility];  Service: General;  Laterality: N/A;  ventral hernia repair with mesh   Social History   Tobacco Use  . Smoking status: Never Smoker  . Smokeless tobacco: Never Used  Substance Use Topics  . Alcohol use: Yes    Comment: very rarely   family history includes Diabetes in her brother, maternal grandmother, and maternal uncle; Diabetes  type II in her mother; Heart attack (age of onset: 24) in her father; Heart disease in an other family member; Hyperlipidemia in her father and mother; Hypertension in her father and mother; Ovarian cancer in her maternal aunt and maternal aunt.  Medications: Current Outpatient Medications  Medication Sig Dispense Refill  . allopurinol (ZYLOPRIM) 100 MG tablet Take 1 tablet by mouth daily.    . Blood Glucose Monitoring Suppl (BAYER CONTOUR MONITOR) w/Device KIT Use to check blood sugar one time daily. Dx: E11.65. DUE FOR FOLLOW UP 1 kit 0  . DEXILANT 60 MG capsule Take 1 capsule (60 mg total) by mouth daily before breakfast. 30 capsule 10  . fluticasone (FLONASE) 50 MCG/ACT nasal spray Place 2 sprays into the nose daily. 16 g 2  . gabapentin (NEURONTIN) 300 MG capsule Take 1 capsule by mouth at bedtime.    Marland Kitchen glipiZIDE (GLUCOTROL) 10 MG tablet Take 10 mg by mouth 2 (two) times daily before a meal.    . glucose blood (BAYER CONTOUR TEST) test strip For checking blood sugar once daily. Dx:E11.9 100 each 11  . Insulin Pen Needle (B-D ULTRAFINE III SHORT PEN) 31G X 8 MM MISC INJECT INTO THE SKIN ONCE DAILY AS DIRECTED 100 each PRN  . JANUMET XR [phone] MG TB24 TAKE 1 TABLET BY MOUTH DAILY 90 tablet 1  . ketotifen (ZADITOR) 0.025 % ophthalmic solution Place 1 drop into both eyes every morning.    . Microlet Lancets MISC USE TO CHECK SUGAR ONCE DAILY 100 each 6  . ondansetron (ZOFRAN-ODT) 4 MG disintegrating tablet Take 1 tablet (4 mg total) by mouth every 8 (eight) hours as needed for nausea or vomiting. 15 tablet 0  . pregabalin (LYRICA) 25 MG capsule Take 1 capsule (25 mg total) by mouth 2 (two) times daily. 180 capsule 3  . Semaglutide,0.25 or 0.5MG/DOS, (OZEMPIC, 0.25 OR 0.5 MG/DOSE,) 2 MG/1.5ML SOPN Inject 0.25 mg into the skin once a week. Then increase to 0.63m weekly 4 pen 2   No current facility-administered medications for this visit.    Allergies  Allergen Reactions  . Penicillins  Anaphylaxis  . Sulfa Drugs Cross Reactors Anaphylaxis  . Contrast Media [Iodinated Diagnostic Agents] Hives  . Atorvastatin Other (See Comments)    Muscle aches  . Gabapentin Other (See Comments)    GERD  . Januvia [Sitagliptin] Other (See Comments)    Hot flashes and flushed and dizzy  . Morphine And Related Nausea And Vomiting    Pt chooses not to take it  . Prednisone     Other reaction(s): Confusion  . Azithromycin Other (See Comments)    Other reaction(s): Other Makes her feel really crazy. Makes her feel really crazy.  .Marland KitchenHydrocodone Nausea And Vomiting  . Invokana [Canagliflozin] Rash    Rash starting on her face

## 2019-07-30 ENCOUNTER — Ambulatory Visit: Payer: BLUE CROSS/BLUE SHIELD | Admitting: Internal Medicine

## 2019-08-05 DIAGNOSIS — M50122 Cervical disc disorder at C5-C6 level with radiculopathy: Secondary | ICD-10-CM | POA: Diagnosis not present

## 2019-08-05 DIAGNOSIS — M50123 Cervical disc disorder at C6-C7 level with radiculopathy: Secondary | ICD-10-CM | POA: Diagnosis not present

## 2019-08-05 DIAGNOSIS — Z6831 Body mass index (BMI) 31.0-31.9, adult: Secondary | ICD-10-CM | POA: Diagnosis not present

## 2019-08-05 DIAGNOSIS — M4722 Other spondylosis with radiculopathy, cervical region: Secondary | ICD-10-CM | POA: Diagnosis not present

## 2019-08-12 ENCOUNTER — Other Ambulatory Visit: Payer: Self-pay

## 2019-08-12 ENCOUNTER — Encounter: Payer: Self-pay | Admitting: Family Medicine

## 2019-08-12 ENCOUNTER — Emergency Department (INDEPENDENT_AMBULATORY_CARE_PROVIDER_SITE_OTHER)
Admission: EM | Admit: 2019-08-12 | Discharge: 2019-08-12 | Disposition: A | Discharge status: Home / Self Care | Payer: BC Managed Care – PPO | Source: Home / Self Care

## 2019-08-12 DIAGNOSIS — N3001 Acute cystitis with hematuria: Secondary | ICD-10-CM | POA: Diagnosis not present

## 2019-08-12 DIAGNOSIS — R300 Dysuria: Secondary | ICD-10-CM

## 2019-08-12 LAB — POCT URINALYSIS DIP (MANUAL ENTRY)
Bilirubin, UA: NEGATIVE
Glucose, UA: NEGATIVE mg/dL
Ketones, POC UA: NEGATIVE mg/dL
Nitrite, UA: NEGATIVE
Protein Ur, POC: 30 mg/dL — AB
Spec Grav, UA: >=1.03 — AB (ref 1.010–1.025)
Urobilinogen, UA: 0.2 E.U./dL
pH, UA: 5 (ref 5.0–8.0)

## 2019-08-12 MED ORDER — CIPROFLOXACIN HCL 500 MG PO TABS: 500.0000 mg | ORAL_TABLET | Freq: Two times a day (BID) | ORAL | 0 refills | Status: DC

## 2019-08-12 NOTE — ED Provider Notes (Signed)
[facility]    CSN: [id number] Arrival date & time: 08/12/19  1741      History   Chief Complaint Chief Complaint  Patient presents with  . Dysuria    HPI Nicole Mata is a 58 y.o. female.   58 yo established [place] patient  For about 1 week has had burning when urinating.  The last couple of days has had back pain along with it.  She has some chronic nausea from her diabetes medicine for which she has anti-nausea medicine.  No vomiting or fever.  Some back pain and a h/o kidney stones on the left side.     Past Medical History:  Diagnosis Date  . Ankle fracture, right 04/12/13  . Diabetes mellitus without complication ([place])   . Diverticulosis   . Gastropathy    reactive  . GERD (gastroesophageal reflux disease)   . Helicobacter pylori gastritis   . Hiatal hernia   . IBS (irritable bowel syndrome)   . Ileitis   . PONV (postoperative nausea and vomiting)     Patient Active Problem List   Diagnosis Date Noted  . Kidney stones 07/01/2019  . Eustachian tube dysfunction, bilateral 02/01/2018  . Lumbar spondylosis 06/21/2017  . Urinary urgency 10/04/2016  . Diabetic peripheral neuropathy ([place]) 10/04/2016  . Hyperlipidemia 06/18/2015  . Chondromalacia of right patellofemoral joint 12/15/2014  . History of kidney stones 12/15/2014  . Controlled diabetes mellitus type 2 with complications ([place]) 57/32/2025  . Menopausal symptom 06/04/2013  . Umbilical hernia 42/70/6237  . Small bowel disease 10/14/2012  . Obese 02/06/2012  . Cervical radiculopathy at C7 01/16/2011  . GERD 12/17/2008  . HIATAL HERNIA 12/17/2008  . IRRITABLE BOWEL SYNDROME 12/17/2008  . HELICOBACTER PYLORI GASTRITIS, HX OF 12/17/2008    Past Surgical History:  Procedure Laterality Date  . CHOLECYSTECTOMY  2001  . LAPAROSCOPY  11/19/2012   Procedure: LAPAROSCOPY DIAGNOSTIC;  Surgeon: Joyice Faster. Cornett, MD;  Location: [facility];  Service: General;  Laterality: N/A;  Diagnostic laparoscopy   . SHOULDER SURGERY  2002, 2003   bilateral for bone spurs and torn RC  . UMBILICAL HERNIA REPAIR  11/19/2012   Procedure: LAPAROSCOPIC UMBILICAL HERNIA;  Surgeon: Joyice Faster. Cornett, MD;  Location: [facility];  Service: General;  Laterality: N/A;  ventral hernia repair with mesh    OB History    Gravida  1   Para      Term      Preterm      AB  1   Living        SAB  1   TAB      Ectopic      Multiple      Live Births               Home Medications    Prior to Admission medications   Medication Sig Start Date End Date Taking? Authorizing Provider  allopurinol (ZYLOPRIM) 100 MG tablet Take 1 tablet by mouth daily. 07/09/19   [provider]  azelastine (ASTELIN) 0.1 % nasal spray Place 2 sprays into both nostrils 2 (two) times daily. Use in each nostril as directed 07/24/19   Gregor Hams, MD  Blood Glucose Monitoring Suppl (BAYER CONTOUR MONITOR) w/Device KIT Use to check blood sugar one time daily. Dx: E11.65. DUE FOR FOLLOW UP 06/16/16   Hali Marry, MD  ciprofloxacin (CIPRO) 500 MG tablet Take 1 tablet (500 mg total) by mouth 2 (two) times daily. 08/12/19  Robyn Haber, MD  DEXILANT 60 MG capsule Take 1 capsule (60 mg total) by mouth daily before breakfast. 09/18/18   Hali Marry, MD  doxycycline (VIBRA-TABS) 100 MG tablet Take 1 tablet (100 mg total) by mouth 2 (two) times daily. 07/24/19   Gregor Hams, MD  fluticasone (FLONASE) 50 MCG/ACT nasal spray Place 2 sprays into the nose daily. 09/10/13   Breeback, Jade L, PA-C  gabapentin (NEURONTIN) 300 MG capsule Take 1 capsule by mouth at bedtime. 07/09/19   [provider]  glipiZIDE (GLUCOTROL) 10 MG tablet Take 10 mg by mouth 2 (two) times daily before a meal.    [provider]  glucose blood (BAYER CONTOUR TEST) test strip For checking blood sugar once daily. Dx:E11.9 02/12/19   Hali Marry, MD  Insulin Pen Needle (B-D ULTRAFINE III SHORT PEN) 31G X 8 MM MISC  INJECT INTO THE SKIN ONCE DAILY AS DIRECTED 02/12/19   Hali Marry, MD  JANUMET XR [phone] MG TB24 TAKE 1 TABLET BY MOUTH DAILY 05/01/19   Hali Marry, MD  ketotifen (ZADITOR) 0.025 % ophthalmic solution Place 1 drop into both eyes every morning.    [provider]  Microlet Lancets MISC USE TO CHECK SUGAR ONCE DAILY 02/12/19   Hali Marry, MD  ondansetron (ZOFRAN-ODT) 4 MG disintegrating tablet Take 1 tablet (4 mg total) by mouth every 8 (eight) hours as needed for nausea or vomiting. 07/01/19   Hali Marry, MD  pregabalin (LYRICA) 25 MG capsule Take 1 capsule (25 mg total) by mouth 2 (two) times daily. 05/01/19   Hali Marry, MD  Semaglutide,0.25 or 0.5MG/DOS, (OZEMPIC, 0.25 OR 0.5 MG/DOSE,) 2 MG/1.5ML SOPN Inject 0.25 mg into the skin once a week. Then increase to 0.64m weekly 04/14/19   MHali Marry MD    Family History Family History  Problem Relation Age of Onset  . Heart disease Other        family disease  . Heart attack Father 576      grandparents  . Hypertension Father   . Hyperlipidemia Father   . Diabetes Maternal Grandmother   . Diabetes Maternal Uncle   . Ovarian cancer Maternal Aunt        x 2  . Hypertension Mother   . Hyperlipidemia Mother   . Diabetes type II Mother   . Diabetes Brother   . Ovarian cancer Maternal Aunt   . Colon cancer Neg Hx     Social History Social History   Tobacco Use  . Smoking status: Never Smoker  . Smokeless tobacco: Never Used  Substance Use Topics  . Alcohol use: Yes    Comment: very rarely  . Drug use: No     Allergies   Penicillins, Sulfa drugs cross reactors, Contrast media [iodinated diagnostic agents], Atorvastatin, Gabapentin, Januvia [sitagliptin], Morphine and related, Prednisone, Azithromycin, Hydrocodone, and Invokana [canagliflozin]   Review of Systems Review of Systems  Gastrointestinal: Positive for nausea.  Genitourinary: Positive for dysuria.   All other systems reviewed and are negative.    Physical Exam Triage Vital Signs ED Triage Vitals  Enc Vitals Group     BP 08/12/19 1758 (!) 145/84     Pulse Rate 08/12/19 1758 82     Resp 08/12/19 1758 20     Temp 08/12/19 1758 98.1 F (36.7 C)     Temp Source 08/12/19 1758 Oral     SpO2 08/12/19 1758 99 %  Weight 08/12/19 1759 205 lb (93 kg)     Height 08/12/19 1759 _0  (1.702 m)     Head Circumference --      Peak Flow --      Pain Score 08/12/19 1759 4     Pain Loc --      Pain Edu? --      Excl. in [place]? --    No data found.  Updated Vital Signs BP (!) 145/84 (BP Location: Right Arm)   Pulse 82   Temp 98.1 F (36.7 C) (Oral)   Resp 20   Ht _1  (1.702 m)   Wt 93 kg   SpO2 99%   BMI 32.11 kg/m    Physical Exam Vitals signs and nursing note reviewed.  Constitutional:      Appearance: Normal appearance.  Eyes:     Conjunctiva/sclera: Conjunctivae normal.  Neck:     Musculoskeletal: Normal range of motion and neck supple.  Cardiovascular:     Rate and Rhythm: Normal rate.  Pulmonary:     Effort: Pulmonary effort is normal.  Musculoskeletal: Normal range of motion.  Skin:    General: Skin is warm and dry.  Neurological:     General: No focal deficit present.     Mental Status: She is alert.  Psychiatric:        Mood and Affect: Mood normal.      UC Treatments / Results  Labs (all labs ordered are listed, but only abnormal results are displayed) Labs Reviewed  POCT URINALYSIS DIP (MANUAL ENTRY) - Abnormal; Notable for the following components:      Result Value   Spec Grav, UA >=1.030 (*)    Blood, UA large (*)    Protein Ur, POC =30 (*)    Leukocytes, UA Small (1+) (*)    All other components within normal limits  URINE CULTURE    EKG   Radiology No results found.  Procedures Procedures (including critical care time)  Medications Ordered in UC Medications - No data to display  Initial Impression / Assessment and Plan / UC  Course  I have reviewed the triage vital signs and the nursing notes.  Pertinent labs & imaging results that were available during my care of the patient were reviewed by me and considered in my medical decision making (see chart for details).    Final Clinical Impressions(s) / UC Diagnoses   Final diagnoses:  Dysuria  Acute cystitis with hematuria   Discharge Instructions   None    ED Prescriptions    Medication Sig Dispense Auth. Provider   ciprofloxacin (CIPRO) 500 MG tablet Take 1 tablet (500 mg total) by mouth 2 (two) times daily. 10 tablet Robyn Haber, MD     Controlled Substance Prescriptions [place] Controlled Substance Registry consulted? Not Applicable   Robyn Haber, MD 08/12/19 [phone]

## 2019-08-12 NOTE — ED Triage Notes (Signed)
For about 1 week has had burning when urinating.  The last couple of days has had back pain along with it.

## 2019-08-14 LAB — URINE CULTURE
MICRO NUMBER:: 857297
Result:: NO GROWTH
SPECIMEN QUALITY:: ADEQUATE

## 2019-08-26 ENCOUNTER — Encounter: Payer: Self-pay | Admitting: Family Medicine

## 2019-08-26 ENCOUNTER — Ambulatory Visit (INDEPENDENT_AMBULATORY_CARE_PROVIDER_SITE_OTHER): Payer: BC Managed Care – PPO | Admitting: Family Medicine

## 2019-08-26 ENCOUNTER — Other Ambulatory Visit: Payer: Self-pay

## 2019-08-26 VITALS — BP 138/71 | HR 85 | Ht 67.0 in | Wt 206.0 lb

## 2019-08-26 DIAGNOSIS — H9202 Otalgia, left ear: Secondary | ICD-10-CM

## 2019-08-26 NOTE — Progress Notes (Signed)
Acute Office Visit  Subjective:    Patient ID: Nicole Mata, female    DOB: 19-Dec-1960, 58 y.o.   MRN: [id number]  Chief Complaint  Patient presents with  . Ear Pain    L ear    HPI Patient is in today for left ear issues.  She says last Friday evening she felt something in her left ear and reached out and noticed a small little knot.  She says it was fine for couple days and then started to bother her again.  She says sometimes when she moves her head or lays down the side she feels like something some is making a scratching noise.  Then she started to get a little achiness in the ear today.  Sometimes she does get sinus infections in fact she had one in July and sometimes will hurt into her ear so she just wanted to make sure that everything looked okay. She did take some Tylenol for the pain.      Past Medical History:  Diagnosis Date  . Ankle fracture, right 04/12/13  . Diabetes mellitus without complication ([place])   . Diverticulosis   . Gastropathy    reactive  . GERD (gastroesophageal reflux disease)   . Helicobacter pylori gastritis   . Hiatal hernia   . IBS (irritable bowel syndrome)   . Ileitis   . PONV (postoperative nausea and vomiting)     Past Surgical History:  Procedure Laterality Date  . CHOLECYSTECTOMY  2001  . LAPAROSCOPY  11/19/2012   Procedure: LAPAROSCOPY DIAGNOSTIC;  Surgeon: Joyice Faster. Cornett, MD;  Location: [facility];  Service: General;  Laterality: N/A;  Diagnostic laparoscopy  . SHOULDER SURGERY  2002, 2003   bilateral for bone spurs and torn RC  . UMBILICAL HERNIA REPAIR  11/19/2012   Procedure: LAPAROSCOPIC UMBILICAL HERNIA;  Surgeon: Joyice Faster. Cornett, MD;  Location: [facility];  Service: General;  Laterality: N/A;  ventral hernia repair with mesh    Family History  Problem Relation Age of Onset  . Heart disease Other        family disease  . Heart attack Father 66       grandparents  . Hypertension Father   . Hyperlipidemia Father   .  Diabetes Maternal Grandmother   . Diabetes Maternal Uncle   . Ovarian cancer Maternal Aunt        x 2  . Hypertension Mother   . Hyperlipidemia Mother   . Diabetes type II Mother   . Diabetes Brother   . Ovarian cancer Maternal Aunt   . Colon cancer Neg Hx     Social History   Socioeconomic History  . Marital status: Married    Spouse name: Not on file  . Number of children: 0  . Years of education: Not on file  . Highest education level: Not on file  Occupational History  . 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 Financial resource strain: Not on file  . Food insecurity    Worry: Not on file    Inability: Not on file  . Transportation needs    Medical: Not on file    Non-medical: Not on file  Tobacco Use  . Smoking status: Never Smoker  . Smokeless tobacco: Never Used  Substance and Sexual Activity  . Alcohol use: Yes    Comment: very rarely  . Drug use: No  . Sexual activity: Yes    Partners: Male    Comment: divorced, no  kids, customer service, 2 caffeine drinks daily.  Lifestyle  . Physical activity    Days per week: Not on file    Minutes per session: Not on file  . Stress: Not on file  Relationships  . Social Herbalist on phone: Not on file    Gets together: Not on file    Attends religious service: Not on file    Active member of club or organization: Not on file    Attends meetings of clubs or organizations: Not on file    Relationship status: Not on file  . Intimate partner violence    Fear of current or ex partner: Not on file    Emotionally abused: Not on file    Physically abused: Not on file    Forced sexual activity: Not on file  Other Topics Concern  . Not on file  Social History Narrative   1 can of soda a day.  No regular exercise.     Outpatient Medications Prior to Visit  Medication Sig Dispense Refill  . allopurinol (ZYLOPRIM) 100 MG tablet Take 1 tablet by mouth daily.    Marland Kitchen azelastine (ASTELIN) 0.1 % nasal spray  Place 2 sprays into both nostrils 2 (two) times daily. Use in each nostril as directed 30 mL 12  . Blood Glucose Monitoring Suppl (BAYER CONTOUR MONITOR) w/Device KIT Use to check blood sugar one time daily. Dx: E11.65. DUE FOR FOLLOW UP 1 kit 0  . DEXILANT 60 MG capsule Take 1 capsule (60 mg total) by mouth daily before breakfast. 30 capsule 10  . fluticasone (FLONASE) 50 MCG/ACT nasal spray Place 2 sprays into the nose daily. 16 g 2  . gabapentin (NEURONTIN) 300 MG capsule Take 1 capsule by mouth at bedtime.    Marland Kitchen glipiZIDE (GLUCOTROL) 10 MG tablet Take 10 mg by mouth 2 (two) times daily before a meal.    . glucose blood (BAYER CONTOUR TEST) test strip For checking blood sugar once daily. Dx:E11.9 100 each 11  . Insulin Pen Needle (B-D ULTRAFINE III SHORT PEN) 31G X 8 MM MISC INJECT INTO THE SKIN ONCE DAILY AS DIRECTED 100 each PRN  . JANUMET XR [phone] MG TB24 TAKE 1 TABLET BY MOUTH DAILY 90 tablet 1  . ketotifen (ZADITOR) 0.025 % ophthalmic solution Place 1 drop into both eyes every morning.    . Microlet Lancets MISC USE TO CHECK SUGAR ONCE DAILY 100 each 6  . ondansetron (ZOFRAN-ODT) 4 MG disintegrating tablet Take 1 tablet (4 mg total) by mouth every 8 (eight) hours as needed for nausea or vomiting. 15 tablet 0  . pregabalin (LYRICA) 25 MG capsule Take 1 capsule (25 mg total) by mouth 2 (two) times daily. 180 capsule 3  . Semaglutide,0.25 or 0.'5MG'$ /DOS, (OZEMPIC, 0.25 OR 0.5 MG/DOSE,) 2 MG/1.5ML SOPN Inject 0.25 mg into the skin once a week. Then increase to 0.'5mg'$  weekly 4 pen 2  . ciprofloxacin (CIPRO) 500 MG tablet Take 1 tablet (500 mg total) by mouth 2 (two) times daily. 10 tablet 0  . doxycycline (VIBRA-TABS) 100 MG tablet Take 1 tablet (100 mg total) by mouth 2 (two) times daily. 20 tablet 0   No facility-administered medications prior to visit.     Allergies  Allergen Reactions  . Penicillins Anaphylaxis  . Sulfa Drugs Cross Reactors Anaphylaxis  . Contrast Media [Iodinated  Diagnostic Agents] Hives  . Atorvastatin Other (See Comments)    Muscle aches  . Gabapentin Other (See Comments)  GERD  . Januvia [Sitagliptin] Other (See Comments)    Hot flashes and flushed and dizzy  . Morphine And Related Nausea And Vomiting    Pt chooses not to take it  . Prednisone     Other reaction(s): Confusion  . Azithromycin Other (See Comments)    Other reaction(s): Other Makes her feel really crazy. Makes her feel really crazy.  Marland Kitchen Hydrocodone Nausea And Vomiting  . Invokana [Canagliflozin] Rash    Rash starting on her face    ROS     Objective:    Physical Exam  Constitutional: She is oriented to person, place, and time. She appears well-developed and well-nourished.  HENT:  Head: Normocephalic and atraumatic.  Right Ear: External ear normal.  Left Ear: External ear normal.  Nose: Nose normal.  Mouth/Throat: Oropharynx is clear and moist.  TMs and canals are clear.  There is a hairline in the left ear canal that is right near the eardrum.  But no fluid behind the ear  Eyes: Pupils are equal, round, and reactive to light. Conjunctivae and EOM are normal.  Neck: Neck supple. No thyromegaly present.  Cardiovascular: Regular rhythm.  Pulmonary/Chest: Effort normal. She has no wheezes.  Lymphadenopathy:    She has no cervical adenopathy.  Neurological: She is alert and oriented to person, place, and time.  Skin: Skin is warm and dry.  Psychiatric: She has a normal mood and affect.    BP 138/71   Pulse 85   Ht '5\' 7"'$  (1.702 m)   Wt 206 lb (93.4 kg)   SpO2 100%   BMI 32.26 kg/m  Wt Readings from Last 3 Encounters:  08/26/19 206 lb (93.4 kg)  08/12/19 205 lb (93 kg)  07/14/19 200 lb (90.7 kg)    Health Maintenance Due  Topic Date Due  . HIV Screening  07/02/1978  . INFLUENZA VACCINE  07/05/2019    There are no preventive care reminders to display for this patient.   Lab Results  Component Value Date   TSH 1.316 10/13/2013   Lab Results   Component Value Date   WBC 9.8 12/14/2017   HGB 13.2 12/14/2017   HCT 38.2 12/14/2017   MCV 85.8 12/14/2017   PLT 395 12/14/2017   Lab Results  Component Value Date   NA 139 07/14/2019   K 4.5 07/14/2019   CO2 31 07/14/2019   GLUCOSE 112 (H) 07/14/2019   BUN 15 07/14/2019   CREATININE 0.76 07/14/2019   BILITOT 0.4 07/14/2019   ALKPHOS 74 07/18/2017   AST 16 07/14/2019   ALT 10 07/14/2019   PROT 7.1 07/14/2019   ALBUMIN 3.9 07/18/2017   CALCIUM 9.5 07/14/2019   Lab Results  Component Value Date   CHOL 156 07/14/2019   Lab Results  Component Value Date   HDL 43 (L) 07/14/2019   Lab Results  Component Value Date   LDLCALC 89 07/14/2019   Lab Results  Component Value Date   TRIG 147 07/14/2019   Lab Results  Component Value Date   CHOLHDL 3.6 07/14/2019   Lab Results  Component Value Date   HGBA1C 6.4 (A) 07/14/2019       Assessment & Plan:   Problem List Items Addressed This Visit    None    Visit Diagnoses    Left ear pain    -  Primary      There was a hair laying against the TM.  Ear irrigated and pt tolerated well. Call if sxs  persist or return.  She does have frequent sinus infection so call if any new sxs.    No orders of the defined types were placed in this encounter.    Beatrice Lecher, MD

## 2019-09-11 ENCOUNTER — Other Ambulatory Visit: Payer: Self-pay | Admitting: Family Medicine

## 2019-09-11 DIAGNOSIS — Z1231 Encounter for screening mammogram for malignant neoplasm of breast: Secondary | ICD-10-CM

## 2019-09-15 DIAGNOSIS — M542 Cervicalgia: Secondary | ICD-10-CM | POA: Diagnosis not present

## 2019-09-15 DIAGNOSIS — M4722 Other spondylosis with radiculopathy, cervical region: Secondary | ICD-10-CM | POA: Diagnosis not present

## 2019-09-22 ENCOUNTER — Other Ambulatory Visit: Payer: Self-pay

## 2019-09-22 ENCOUNTER — Ambulatory Visit (HOSPITAL_BASED_OUTPATIENT_CLINIC_OR_DEPARTMENT_OTHER)
Admission: RE | Admit: 2019-09-22 | Discharge: 2019-09-22 | Disposition: A | Discharge status: Home / Self Care | Payer: BC Managed Care – PPO | Source: Ambulatory Visit | Attending: Family Medicine | Admitting: Family Medicine

## 2019-09-22 DIAGNOSIS — Z1231 Encounter for screening mammogram for malignant neoplasm of breast: Secondary | ICD-10-CM | POA: Insufficient documentation

## 2019-09-23 DIAGNOSIS — M50123 Cervical disc disorder at C6-C7 level with radiculopathy: Secondary | ICD-10-CM | POA: Diagnosis not present

## 2019-09-23 DIAGNOSIS — M50122 Cervical disc disorder at C5-C6 level with radiculopathy: Secondary | ICD-10-CM | POA: Diagnosis not present

## 2019-09-23 DIAGNOSIS — M4722 Other spondylosis with radiculopathy, cervical region: Secondary | ICD-10-CM | POA: Diagnosis not present

## 2019-10-03 ENCOUNTER — Ambulatory Visit: Payer: BC Managed Care – PPO | Admitting: Internal Medicine

## 2019-10-09 DIAGNOSIS — Z6831 Body mass index (BMI) 31.0-31.9, adult: Secondary | ICD-10-CM | POA: Diagnosis not present

## 2019-10-09 DIAGNOSIS — M50123 Cervical disc disorder at C6-C7 level with radiculopathy: Secondary | ICD-10-CM | POA: Diagnosis not present

## 2019-10-09 DIAGNOSIS — M50122 Cervical disc disorder at C5-C6 level with radiculopathy: Secondary | ICD-10-CM | POA: Diagnosis not present

## 2019-10-13 ENCOUNTER — Other Ambulatory Visit: Payer: Self-pay

## 2019-10-13 ENCOUNTER — Ambulatory Visit (INDEPENDENT_AMBULATORY_CARE_PROVIDER_SITE_OTHER): Payer: BC Managed Care – PPO | Admitting: Family Medicine

## 2019-10-13 ENCOUNTER — Encounter: Payer: Self-pay | Admitting: Family Medicine

## 2019-10-13 VITALS — BP 119/69 | HR 99 | Ht 67.0 in | Wt 203.0 lb

## 2019-10-13 DIAGNOSIS — E118 Type 2 diabetes mellitus with unspecified complications: Secondary | ICD-10-CM | POA: Diagnosis not present

## 2019-10-13 DIAGNOSIS — M5412 Radiculopathy, cervical region: Secondary | ICD-10-CM

## 2019-10-13 DIAGNOSIS — Z23 Encounter for immunization: Secondary | ICD-10-CM

## 2019-10-13 DIAGNOSIS — E1142 Type 2 diabetes mellitus with diabetic polyneuropathy: Secondary | ICD-10-CM | POA: Diagnosis not present

## 2019-10-13 DIAGNOSIS — L821 Other seborrheic keratosis: Secondary | ICD-10-CM

## 2019-10-13 LAB — POCT GLYCOSYLATED HEMOGLOBIN (HGB A1C): Hemoglobin A1C: 6.9 % — AB (ref 4.0–5.6)

## 2019-10-13 NOTE — Assessment & Plan Note (Addendum)
Well-controlled A1c, though up from previous.  Just make sure really making good food choices and getting regular exercise over the holidays.

## 2019-10-13 NOTE — Progress Notes (Addendum)
Established Patient Office Visit  Subjective:  Patient ID: Nicole Mata, female    DOB: [birth year]  Age: 58 y.o. MRN: [id number]  CC:  Chief Complaint  Patient presents with  . Diabetes    HPI Nicole Mata presents for   Diabetes - no hypoglycemic events. No wounds or sores that are not healing well. No increased thirst or urination. Checking glucose at home. Taking medications as prescribed without any side effects.  Diabetic neuropathy - she is doing OK.  She is currently on gabapentin.  Having pain around her left eye on and off x 2 months.  She says that the pain will start and kind of radiate from her temple to right around her eyes sometimes she will even notice that there is a little bit of swelling underneath her eye.  She he denies any major visual changes.  She is not sure what iscausing it but it does seem to be coming and going.  She says usually the eye will twitch some when it swells.  No prior history of injury or Bell's palsy.  She also has a little brown spot on the top of her ear that she would like me look at today is not bothersome but she noticed it recently.  He unfortunately continues to have problems with pain in her neck.  Is consulted with orthopedics.  They are discussing possibly doing an injection either in the muscle tissue or right around the spine.  Past Medical History:  Diagnosis Date  . Ankle fracture, right 04/12/13  . Diabetes mellitus without complication ([place])   . Diverticulosis   . Gastropathy    reactive  . GERD (gastroesophageal reflux disease)   . Helicobacter pylori gastritis   . Hiatal hernia   . IBS (irritable bowel syndrome)   . Ileitis   . PONV (postoperative nausea and vomiting)     Past Surgical History:  Procedure Laterality Date  . CHOLECYSTECTOMY  2001  . LAPAROSCOPY  11/19/2012   Procedure: LAPAROSCOPY DIAGNOSTIC;  Surgeon: Joyice Faster. Cornett, MD;  Location: [facility];  Service: General;  Laterality: N/A;  Diagnostic  laparoscopy  . SHOULDER SURGERY  2002, 2003   bilateral for bone spurs and torn RC  . UMBILICAL HERNIA REPAIR  11/19/2012   Procedure: LAPAROSCOPIC UMBILICAL HERNIA;  Surgeon: Joyice Faster. Cornett, MD;  Location: [facility];  Service: General;  Laterality: N/A;  ventral hernia repair with mesh    Family History  Problem Relation Age of Onset  . Heart disease Other        family disease  . Heart attack Father 67       grandparents  . Hypertension Father   . Hyperlipidemia Father   . Diabetes Maternal Grandmother   . Diabetes Maternal Uncle   . Ovarian cancer Maternal Aunt        x 2  . Hypertension Mother   . Hyperlipidemia Mother   . Diabetes type II Mother   . Diabetes Brother   . Ovarian cancer Maternal Aunt   . Colon cancer Neg Hx     Social History   Socioeconomic History  . Marital status: Married    Spouse name: Not on file  . Number of children: 0  . Years of education: Not on file  . Highest education level: Not on file  Occupational History  . 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 Financial resource strain: Not on file  . Food insecurity    Worry:  Not on file    Inability: Not on file  . Transportation needs    Medical: Not on file    Non-medical: Not on file  Tobacco Use  . Smoking status: Never Smoker  . Smokeless tobacco: Never Used  Substance and Sexual Activity  . Alcohol use: Yes    Comment: very rarely  . Drug use: No  . Sexual activity: Yes    Partners: Male    Comment: divorced, no kids, customer service, 2 caffeine drinks daily.  Lifestyle  . Physical activity    Days per week: Not on file    Minutes per session: Not on file  . Stress: Not on file  Relationships  . Social Herbalist on phone: Not on file    Gets together: Not on file    Attends religious service: Not on file    Active member of club or organization: Not on file    Attends meetings of clubs or organizations: Not on file    Relationship status: Not on file   . Intimate partner violence    Fear of current or ex partner: Not on file    Emotionally abused: Not on file    Physically abused: Not on file    Forced sexual activity: Not on file  Other Topics Concern  . Not on file  Social History Narrative   1 can of soda a day.  No regular exercise.     Outpatient Medications Prior to Visit  Medication Sig Dispense Refill  . azelastine (ASTELIN) 0.1 % nasal spray Place 2 sprays into both nostrils 2 (two) times daily. Use in each nostril as directed 30 mL 12  . Blood Glucose Monitoring Suppl (BAYER CONTOUR MONITOR) w/Device KIT Use to check blood sugar one time daily. Dx: E11.65. DUE FOR FOLLOW UP 1 kit 0  . DEXILANT 60 MG capsule Take 1 capsule (60 mg total) by mouth daily before breakfast. 30 capsule 10  . fluticasone (FLONASE) 50 MCG/ACT nasal spray Place 2 sprays into the nose daily. 16 g 2  . glucose blood (BAYER CONTOUR TEST) test strip For checking blood sugar once daily. Dx:E11.9 100 each 11  . Insulin Pen Needle (B-D ULTRAFINE III SHORT PEN) 31G X 8 MM MISC INJECT INTO THE SKIN ONCE DAILY AS DIRECTED 100 each PRN  . JANUMET XR [id number] MG TB24 TAKE 1 TABLET BY MOUTH DAILY 90 tablet 1  . ketotifen (ZADITOR) 0.025 % ophthalmic solution Place 1 drop into both eyes every morning.    . Microlet Lancets MISC USE TO CHECK SUGAR ONCE DAILY 100 each 6  . ondansetron (ZOFRAN-ODT) 4 MG disintegrating tablet Take 1 tablet (4 mg total) by mouth every 8 (eight) hours as needed for nausea or vomiting. 15 tablet 0  . pregabalin (LYRICA) 25 MG capsule Take 1 capsule (25 mg total) by mouth 2 (two) times daily. 180 capsule 3  . Semaglutide,0.25 or 0.5MG/DOS, (OZEMPIC, 0.25 OR 0.5 MG/DOSE,) 2 MG/1.5ML SOPN Inject 0.25 mg into the skin once a week. Then increase to 0.43m weekly 4 pen 2  . gabapentin (NEURONTIN) 100 MG capsule Take 100 mg by mouth daily.    .Marland Kitchenallopurinol (ZYLOPRIM) 100 MG tablet Take 1 tablet by mouth daily.    .Marland KitchenglipiZIDE (GLUCOTROL) 10 MG  tablet Take 10 mg by mouth 2 (two) times daily before a meal.    . gabapentin (NEURONTIN) 300 MG capsule Take 1 capsule by mouth at bedtime.  No facility-administered medications prior to visit.     Allergies  Allergen Reactions  . Penicillins Anaphylaxis  . Sulfa Drugs Cross Reactors Anaphylaxis  . Contrast Media [Iodinated Diagnostic Agents] Hives  . Atorvastatin Other (See Comments)    Muscle aches  . Gabapentin Other (See Comments)    GERD  . Januvia [Sitagliptin] Other (See Comments)    Hot flashes and flushed and dizzy  . Morphine And Related Nausea And Vomiting    Pt chooses not to take it  . Prednisone     Other reaction(s): Confusion  . Azithromycin Other (See Comments)    Other reaction(s): Other Makes her feel really crazy. Makes her feel really crazy.  Marland Kitchen Hydrocodone Nausea And Vomiting  . Invokana [Canagliflozin] Rash    Rash starting on her face    ROS Review of Systems    Objective:    Physical Exam  Constitutional: She is oriented to person, place, and time. She appears well-developed and well-nourished.  HENT:  Head: Normocephalic and atraumatic.  The right upper outer ear she has a pigmented area most consistent with a seborrheic keratosis.  Cardiovascular: Normal rate, regular rhythm and normal heart sounds.  Pulmonary/Chest: Effort normal and breath sounds normal.  Neurological: She is alert and oriented to person, place, and time.  Skin: Skin is warm and dry.  Psychiatric: She has a normal mood and affect. Her behavior is normal.    BP 119/69   Pulse 99   Ht 5' 7" (1.702 m)   Wt 203 lb (92.1 kg)   SpO2 98%   BMI 31.79 kg/m  Wt Readings from Last 3 Encounters:  10/13/19 203 lb (92.1 kg)  08/26/19 206 lb (93.4 kg)  08/12/19 205 lb (93 kg)     Health Maintenance Due  Topic Date Due  . HIV Screening  07/02/1978    There are no preventive care reminders to display for this patient.  Lab Results  Component Value Date   TSH 1.316  10/13/2013   Lab Results  Component Value Date   WBC 9.8 12/14/2017   HGB 13.2 12/14/2017   HCT 38.2 12/14/2017   MCV 85.8 12/14/2017   PLT 395 12/14/2017   Lab Results  Component Value Date   NA 139 07/14/2019   K 4.5 07/14/2019   CO2 31 07/14/2019   GLUCOSE 112 (H) 07/14/2019   BUN 15 07/14/2019   CREATININE 0.76 07/14/2019   BILITOT 0.4 07/14/2019   ALKPHOS 74 07/18/2017   AST 16 07/14/2019   ALT 10 07/14/2019   PROT 7.1 07/14/2019   ALBUMIN 3.9 07/18/2017   CALCIUM 9.5 07/14/2019   Lab Results  Component Value Date   CHOL 156 07/14/2019   Lab Results  Component Value Date   HDL 43 (L) 07/14/2019   Lab Results  Component Value Date   LDLCALC 89 07/14/2019   Lab Results  Component Value Date   TRIG 147 07/14/2019   Lab Results  Component Value Date   CHOLHDL 3.6 07/14/2019   Lab Results  Component Value Date   HGBA1C 6.9 (A) 10/13/2019      Assessment & Plan:   Problem List Items Addressed This Visit      Endocrine   Diabetic peripheral neuropathy ([place])    Continue current regimen.      Controlled diabetes mellitus type 2 with complications (HCC) - Primary    Well-controlled A1c, though up from previous.  Just make sure really making good food choices and getting  regular exercise over the holidays.       Relevant Orders   POCT HgB A1C (Completed)     Nervous and Auditory   Cervical radiculopathy at C7    Other Visit Diagnoses    Need for immunization against influenza       Relevant Orders   Flu Vaccine QUAD 36+ mos IM (Completed)   Seborrheic keratosis         Seborrheic keratosis- gave reassurance.  Can be treated with cryotherapy.    Left eye pain/swelling-unclear etiology.  She is not noticing it today necessarily am not sure what would cause this to come and go.  She is never had any type of nerve injury or Bell's palsy.  Could refer to eye doctor or neurology if needed.  No orders of the defined types were placed in this  encounter.   Follow-up: Return in about 4 months (around 02/10/2020) for Diabetes follow-up.    Beatrice Lecher, MD

## 2019-10-13 NOTE — Assessment & Plan Note (Addendum)
Continue current regimen

## 2019-10-15 DIAGNOSIS — M50122 Cervical disc disorder at C5-C6 level with radiculopathy: Secondary | ICD-10-CM | POA: Diagnosis not present

## 2019-10-15 DIAGNOSIS — M7918 Myalgia, other site: Secondary | ICD-10-CM | POA: Diagnosis not present

## 2019-10-20 ENCOUNTER — Ambulatory Visit: Payer: BC Managed Care – PPO | Admitting: Family Medicine

## 2019-11-04 ENCOUNTER — Encounter: Payer: Self-pay | Admitting: Family Medicine

## 2019-11-04 ENCOUNTER — Ambulatory Visit (INDEPENDENT_AMBULATORY_CARE_PROVIDER_SITE_OTHER): Payer: BC Managed Care – PPO | Admitting: Family Medicine

## 2019-11-04 VITALS — BP 124/82 | Temp 97.0°F

## 2019-11-04 DIAGNOSIS — J0100 Acute maxillary sinusitis, unspecified: Secondary | ICD-10-CM | POA: Diagnosis not present

## 2019-11-04 MED ORDER — DEXILANT 60 MG PO CPDR: 60.0000 mg | DELAYED_RELEASE_CAPSULE | Freq: Every day | ORAL | 10 refills | Status: DC

## 2019-11-04 MED ORDER — CEFDINIR 300 MG PO CAPS: 300.0000 mg | ORAL_CAPSULE | Freq: Two times a day (BID) | ORAL | 0 refills | Status: DC

## 2019-11-04 NOTE — Progress Notes (Signed)
Virtual Visit via Video Note  I connected with Nicole Mata on 11/04/19 at 11:10 AM EST by a video enabled telemedicine application and verified that I am speaking with the correct person using two identifiers.   I discussed the limitations of evaluation and management by telemedicine and the availability of in person appointments. The patient expressed understanding and agreed to proceed.  Subjective:    CC: Sinus sx.    HPI:  Nicole Mata reports that Nicole Mata sxs have been going on for 2-3 wks. drainage began last night Nicole Mata has some bilateral maxilary facial pain and pain behind Nicole Mata eyes. drainage is clear.  Had a little bit of irritated throat.  No headache.  Nicole Mata ears have started popping.  No GI symptoms.  No fevers chills or sweats.  Did use a nasal spray this morning.  Past medical history, Surgical history, Family history not pertinant except as noted below, Social history, Allergies, and medications have been entered into the medical record, reviewed, and corrections made.   Review of Systems: No fevers, chills, night sweats, weight loss, chest pain, or shortness of breath.   Objective:    General: Speaking clearly in complete sentences without any shortness of breath.  Alert and oriented x3.  Normal judgment. No apparent acute distress.    Impression and Recommendations:    Acute sinusitis -we will go ahead and treat with Omnicef.  Call if not better in 1 week.  Recommend nasal saline irrigation.  Keep an eye on Nicole Mata blood sugars.  Nicole Mata says they have actually been really well controlled through Thanksgiving.       I discussed the assessment and treatment plan with the patient. The patient was provided an opportunity to ask questions and all were answered. The patient agreed with the plan and demonstrated an understanding of the instructions.   The patient was advised to call back or seek an in-person evaluation if the symptoms worsen or if the condition fails to improve as  anticipated.   Beatrice Lecher, MD

## 2019-11-05 ENCOUNTER — Telehealth: Payer: Self-pay

## 2019-11-05 NOTE — Telephone Encounter (Signed)
Please tell her that I apologize.  That was an error on our part.  She only needs to take it for 7 days which is a total of 14 capsules.  She can extend it to 10 days if she feels like she is not 100% better by the time she gets to the seventh day.  I would just keep the rest of it and that way if something comes up down the road we might be able to use it.

## 2019-11-05 NOTE — Telephone Encounter (Signed)
Nicole Mata called and states she received 60 capsules of the Omnicef. She wasn't sure how long she needs to take the medication.

## 2019-11-05 NOTE — Telephone Encounter (Signed)
Patient advised.

## 2019-11-06 ENCOUNTER — Ambulatory Visit: Payer: BC Managed Care – PPO | Admitting: Family Medicine

## 2019-11-17 ENCOUNTER — Encounter: Payer: Self-pay | Admitting: Family Medicine

## 2019-11-17 ENCOUNTER — Ambulatory Visit (INDEPENDENT_AMBULATORY_CARE_PROVIDER_SITE_OTHER): Payer: BC Managed Care – PPO | Admitting: Family Medicine

## 2019-11-17 ENCOUNTER — Other Ambulatory Visit: Payer: Self-pay

## 2019-11-17 VITALS — BP 135/60 | HR 86 | Ht 67.0 in | Wt 207.1 lb

## 2019-11-17 DIAGNOSIS — H9203 Otalgia, bilateral: Secondary | ICD-10-CM

## 2019-11-17 DIAGNOSIS — L821 Other seborrheic keratosis: Secondary | ICD-10-CM | POA: Diagnosis not present

## 2019-11-17 DIAGNOSIS — M545 Low back pain, unspecified: Secondary | ICD-10-CM

## 2019-11-17 DIAGNOSIS — E118 Type 2 diabetes mellitus with unspecified complications: Secondary | ICD-10-CM | POA: Diagnosis not present

## 2019-11-17 DIAGNOSIS — N760 Acute vaginitis: Secondary | ICD-10-CM

## 2019-11-17 DIAGNOSIS — R300 Dysuria: Secondary | ICD-10-CM

## 2019-11-17 LAB — POCT URINALYSIS DIP (CLINITEK)
Bilirubin, UA: NEGATIVE
Blood, UA: NEGATIVE
Glucose, UA: 100 mg/dL — AB
Ketones, POC UA: NEGATIVE mg/dL
Nitrite, UA: NEGATIVE
POC PROTEIN,UA: NEGATIVE
Spec Grav, UA: >=1.03 — AB (ref 1.010–1.025)
Urobilinogen, UA: 0.2 E.U./dL
pH, UA: 5 (ref 5.0–8.0)

## 2019-11-17 MED ORDER — TRULICITY 0.75 MG/0.5ML ~~LOC~~ SOAJ: 0.7500 mg | SUBCUTANEOUS | 2 refills | Status: DC

## 2019-11-17 NOTE — Progress Notes (Signed)
Established Patient Office Visit  Subjective:  Patient ID: Nicole Mata, female    DOB: Oct 28, 1961  Age: 58 y.o. MRN: [id number]  CC:  Chief Complaint  Patient presents with  . cryotherapy  . Urinary Tract Infection  . Hot Flashes    HPI Nicole Mata presents for cryotherapy for lesion on her right ear.  She has a brown lesion right at the top of her ear where it attaches to her temple that she would like to have treated.  She has had some burning and itching  X 5 daysand thinks has a UTI.  Also has some low back pain x 2 days.   with it as well.  She actually did an over-the-counter yeast treatment on Saturday and feels like the itching is actually a little bit better.  She denies any fever.  She has had some flushing and hot flashes but feels like it is related to the [place].  She has had bilateral ear pain, having pressure and popping.  She did complete antibiotic.  She still finishing that up.  She is using a nasal steroid spray.  She received a letter form insurance that the [place] will need authorization.    Past Medical History:  Diagnosis Date  . Ankle fracture, right 04/12/13  . Diabetes mellitus without complication ([place])   . Diverticulosis   . Gastropathy    reactive  . GERD (gastroesophageal reflux disease)   . Helicobacter pylori gastritis   . Hiatal hernia   . IBS (irritable bowel syndrome)   . Ileitis   . PONV (postoperative nausea and vomiting)     Past Surgical History:  Procedure Laterality Date  . CHOLECYSTECTOMY  2001  . LAPAROSCOPY  11/19/2012   Procedure: LAPAROSCOPY DIAGNOSTIC;  Surgeon: Joyice Faster. Cornett, MD;  Location: [facility];  Service: General;  Laterality: N/A;  Diagnostic laparoscopy  . SHOULDER SURGERY  2002, 2003   bilateral for bone spurs and torn RC  . UMBILICAL HERNIA REPAIR  11/19/2012   Procedure: LAPAROSCOPIC UMBILICAL HERNIA;  Surgeon: Joyice Faster. Cornett, MD;  Location: [facility];  Service: General;  Laterality: N/A;  ventral hernia  repair with mesh    Family History  Problem Relation Age of Onset  . Heart disease Other        family disease  . Heart attack Father 25       grandparents  . Hypertension Father   . Hyperlipidemia Father   . Diabetes Maternal Grandmother   . Diabetes Maternal Uncle   . Ovarian cancer Maternal Aunt        x 2  . Hypertension Mother   . Hyperlipidemia Mother   . Diabetes type II Mother   . Diabetes Brother   . Ovarian cancer Maternal Aunt   . Colon cancer Neg Hx     Social History   Socioeconomic History  . Marital status: Married    Spouse name: Not on file  . Number of children: 0  . Years of education: Not on file  . Highest education level: Not on file  Occupational History  . Occupation: IRA specialist  Tobacco Use  . Smoking status: Never Smoker  . Smokeless tobacco: Never Used  Substance and Sexual Activity  . Alcohol use: Yes    Comment: very rarely  . Drug use: No  . Sexual activity: Yes    Partners: Male    Comment: divorced, no kids, customer service, 2 caffeine drinks daily.  Other Topics Concern  .  Not on file  Social History Narrative   1 can of soda a day.  No regular exercise.    Social Determinants of Health   Financial Resource Strain:   . Difficulty of Paying Living Expenses: Not on file  Food Insecurity:   . Worried About Charity fundraiser in the Last Year: Not on file  . Ran Out of Food in the Last Year: Not on file  Transportation Needs:   . Lack of Transportation (Medical): Not on file  . Lack of Transportation (Non-Medical): Not on file  Physical Activity:   . Days of Exercise per Week: Not on file  . Minutes of Exercise per Session: Not on file  Stress:   . Feeling of Stress : Not on file  Social Connections:   . Frequency of Communication with Friends and Family: Not on file  . Frequency of Social Gatherings with Friends and Family: Not on file  . Attends Religious Services: Not on file  . Active Member of Clubs or  Organizations: Not on file  . Attends Archivist Meetings: Not on file  . Marital Status: Not on file  Intimate Partner Violence:   . Fear of Current or Ex-Partner: Not on file  . Emotionally Abused: Not on file  . Physically Abused: Not on file  . Sexually Abused: Not on file    Outpatient Medications Prior to Visit  Medication Sig Dispense Refill  . allopurinol (ZYLOPRIM) 100 MG tablet Take 1 tablet by mouth daily.    Marland Kitchen azelastine (ASTELIN) 0.1 % nasal spray Place 2 sprays into both nostrils 2 (two) times daily. Use in each nostril as directed 30 mL 12  . Blood Glucose Monitoring Suppl (BAYER CONTOUR MONITOR) w/Device KIT Use to check blood sugar one time daily. Dx: E11.65. DUE FOR FOLLOW UP 1 kit 0  . cefdinir (OMNICEF) 300 MG capsule Take 1 capsule (300 mg total) by mouth 2 (two) times daily. 60 capsule 0  . DEXILANT 60 MG capsule Take 1 capsule (60 mg total) by mouth daily before breakfast. 30 capsule 10  . fluticasone (FLONASE) 50 MCG/ACT nasal spray Place 2 sprays into the nose daily. 16 g 2  . glipiZIDE (GLUCOTROL) 10 MG tablet Take 10 mg by mouth 2 (two) times daily before a meal.    . glucose blood (BAYER CONTOUR TEST) test strip For checking blood sugar once daily. Dx:E11.9 100 each 11  . Insulin Pen Needle (B-D ULTRAFINE III SHORT PEN) 31G X 8 MM MISC INJECT INTO THE SKIN ONCE DAILY AS DIRECTED 100 each PRN  . JANUMET XR [phone] MG TB24 TAKE 1 TABLET BY MOUTH DAILY 90 tablet 1  . ketotifen (ZADITOR) 0.025 % ophthalmic solution Place 1 drop into both eyes every morning.    . Microlet Lancets MISC USE TO CHECK SUGAR ONCE DAILY 100 each 6  . ondansetron (ZOFRAN-ODT) 4 MG disintegrating tablet Take 1 tablet (4 mg total) by mouth every 8 (eight) hours as needed for nausea or vomiting. 15 tablet 0  . pregabalin (LYRICA) 25 MG capsule Take 1 capsule (25 mg total) by mouth 2 (two) times daily. 180 capsule 3  . Semaglutide,0.25 or 0.5MG/DOS, (OZEMPIC, 0.25 OR 0.5 MG/DOSE,) 2  MG/1.5ML SOPN Inject 0.25 mg into the skin once a week. Then increase to 0.74m weekly 4 pen 2   No facility-administered medications prior to visit.    Allergies  Allergen Reactions  . Penicillins Anaphylaxis  . Sulfa Drugs Cross Reactors Anaphylaxis  .  Contrast Media [Iodinated Diagnostic Agents] Hives  . Atorvastatin Other (See Comments)    Muscle aches  . Gabapentin Other (See Comments)    GERD  . Januvia [Sitagliptin] Other (See Comments)    Hot flashes and flushed and dizzy  . Morphine And Related Nausea And Vomiting    Pt chooses not to take it  . Prednisone     Other reaction(s): Confusion  . Azithromycin Other (See Comments)    Other reaction(s): Other Makes her feel really crazy. Makes her feel really crazy.  Marland Kitchen Hydrocodone Nausea And Vomiting  . Invokana [Canagliflozin] Rash    Rash starting on her face    ROS Review of Systems    Objective:    Physical Exam  Constitutional: She is oriented to person, place, and time. She appears well-developed and well-nourished.  HENT:  Head: Normocephalic and atraumatic.  Right Ear: External ear normal.  Left Ear: External ear normal.  Nose: Nose normal.  Tympanic membranes with slightly distorted light reflex bilaterally.  But no significant erythema.  It looks like there is a little bit of bulging of both TMs.  Eyes: Conjunctivae are normal.  Cardiovascular: Normal rate, regular rhythm and normal heart sounds.  Pulmonary/Chest: Effort normal and breath sounds normal.  Neurological: She is alert and oriented to person, place, and time.  Skin: Skin is warm and dry.  Psychiatric: She has a normal mood and affect. Her behavior is normal.    BP 135/60   Pulse 86   Ht _0  (1.702 m)   Wt 207 lb 1.9 oz (93.9 kg) Comment: Pt declined putting belongings down for weight check  SpO2 100%   BMI 32.44 kg/m  Wt Readings from Last 3 Encounters:  11/17/19 207 lb 1.9 oz (93.9 kg)  10/13/19 203 lb (92.1 kg)  08/26/19 206 lb  (93.4 kg)     Health Maintenance Due  Topic Date Due  . URINE MICROALBUMIN  12/19/2019    There are no preventive care reminders to display for this patient.  Lab Results  Component Value Date   TSH 1.316 10/13/2013   Lab Results  Component Value Date   WBC 9.8 12/14/2017   HGB 13.2 12/14/2017   HCT 38.2 12/14/2017   MCV 85.8 12/14/2017   PLT 395 12/14/2017   Lab Results  Component Value Date   NA 139 07/14/2019   K 4.5 07/14/2019   CO2 31 07/14/2019   GLUCOSE 112 (H) 07/14/2019   BUN 15 07/14/2019   CREATININE 0.76 07/14/2019   BILITOT 0.4 07/14/2019   ALKPHOS 74 07/18/2017   AST 16 07/14/2019   ALT 10 07/14/2019   PROT 7.1 07/14/2019   ALBUMIN 3.9 07/18/2017   CALCIUM 9.5 07/14/2019   Lab Results  Component Value Date   CHOL 156 07/14/2019   Lab Results  Component Value Date   HDL 43 (L) 07/14/2019   Lab Results  Component Value Date   LDLCALC 89 07/14/2019   Lab Results  Component Value Date   TRIG 147 07/14/2019   Lab Results  Component Value Date   CHOLHDL 3.6 07/14/2019   Lab Results  Component Value Date   HGBA1C 6.9 (A) 10/13/2019      Assessment & Plan:   Problem List Items Addressed This Visit      Endocrine   Controlled diabetes mellitus type 2 with complications ([place])    Like the Ozempic will not be covered on her new insurance plan or require special authorization so  we can try switching to Trulicity.  Also will see if this improves her hot flashes which she has been experiencing which she feels is related to the [place].      Relevant Medications   Dulaglutide (TRULICITY) 7.32 KG/2.5KY SOPN     Other   Low back pain   Relevant Orders   POCT URINALYSIS DIP (CLINITEK) (Completed)   Urine Culture    Other Visit Diagnoses    Dysuria    -  Primary   Relevant Orders   POCT URINALYSIS DIP (CLINITEK) (Completed)   Urine Culture   Acute vaginitis       Relevant Orders   WET PREP FOR [place], YEAST, CLUE (Completed)    Otalgia of both ears       Seborrheic keratosis        Bilateral ear pain/eustachian tube dysfunction-tympanic membrane's with some bulge.  Recommend adding a decongestant to her current regimen.  Acute vaginitis-wet prep performed.  Will call with results once available.  Seborrheic keratosis-cryotherapy performed.  Patient tolerated well.  Dysuria-urinalysis just showed small amount of leukocytes.  Will send for culture for confirmation.  Will call with results once available will hold off on antibiotics since she was just on Omnicef.  Meds ordered this encounter  Medications  . Dulaglutide (TRULICITY) 7.06 CB/7.6EG SOPN    Sig: Inject 0.75 mg into the skin once a week.    Dispense:  4 pen    Refill:  2    Cryotherapy Procedure Note  Pre-operative Diagnosis: Seborrheic keratosis.  Post-operative Diagnosis: same  Locations: right upper outer ear.  Indications: irritation   Anesthesia: not required    Procedure Details  Patient informed of risks (permanent scarring, infection, light or dark discoloration, bleeding, infection, weakness, numbness and recurrence of the lesion) and benefits of the procedure and verbal informed consent obtained.  The areas are treated with liquid nitrogen therapy, frozen until ice ball extended 1-2 mm beyond lesion, allowed to thaw, and treated again. The patient tolerated procedure well.  The patient was instructed on post-op care, warned that there may be blister formation, redness and pain. Recommend OTC analgesia as needed for pain.  Condition: Stable  Complications:none.  Plan: 1. Instructed to keep the area dry and covered for 24-48h and clean thereafter. 2. Warning signs of infection were reviewed.   3. Recommended that the patient use OTC acetaminophen as needed for pain.  4. Return PRN.    Follow-up: Return if symptoms worsen or fail to improve.    Beatrice Lecher, MD

## 2019-11-17 NOTE — Assessment & Plan Note (Signed)
Like the Ozempic will not be covered on her new insurance plan or require special authorization so we can try switching to Trulicity.  Also will see if this improves her hot flashes which she has been experiencing which she feels is related to the [place].

## 2019-11-18 LAB — URINE CULTURE
MICRO NUMBER:: 1195387
SPECIMEN QUALITY:: ADEQUATE

## 2019-11-18 LAB — WET PREP FOR TRICH, YEAST, CLUE
MICRO NUMBER:: 1194433
Specimen Quality: ADEQUATE

## 2020-01-13 ENCOUNTER — Other Ambulatory Visit: Payer: Self-pay

## 2020-01-13 DIAGNOSIS — E119 Type 2 diabetes mellitus without complications: Secondary | ICD-10-CM

## 2020-01-13 MED ORDER — FREESTYLE LIBRE 14 DAY READER DEVI: 1.0000 | Freq: Every day | 99 refills | Status: DC

## 2020-01-13 MED ORDER — FREESTYLE LIBRE 14 DAY SENSOR MISC: 99 refills | Status: DC

## 2020-02-02 ENCOUNTER — Ambulatory Visit (INDEPENDENT_AMBULATORY_CARE_PROVIDER_SITE_OTHER): Payer: BC Managed Care – PPO | Admitting: Family Medicine

## 2020-02-02 ENCOUNTER — Encounter: Payer: Self-pay | Admitting: Family Medicine

## 2020-02-02 ENCOUNTER — Other Ambulatory Visit: Payer: Self-pay | Admitting: *Deleted

## 2020-02-02 VITALS — BP 131/73 | HR 89 | Ht 67.0 in | Wt 203.0 lb

## 2020-02-02 DIAGNOSIS — R0789 Other chest pain: Secondary | ICD-10-CM

## 2020-02-02 DIAGNOSIS — E7800 Pure hypercholesterolemia, unspecified: Secondary | ICD-10-CM

## 2020-02-02 DIAGNOSIS — E118 Type 2 diabetes mellitus with unspecified complications: Secondary | ICD-10-CM | POA: Diagnosis not present

## 2020-02-02 DIAGNOSIS — E1142 Type 2 diabetes mellitus with diabetic polyneuropathy: Secondary | ICD-10-CM | POA: Diagnosis not present

## 2020-02-02 LAB — POCT GLYCOSYLATED HEMOGLOBIN (HGB A1C): Hemoglobin A1C: 6.4 % — AB (ref 4.0–5.6)

## 2020-02-02 LAB — BASIC METABOLIC PANEL WITH GFR
BUN: 12 mg/dL (ref 7–25)
CO2: 31 mmol/L (ref 20–32)
Calcium: 9.8 mg/dL (ref 8.6–10.4)
Chloride: 102 mmol/L (ref 98–110)
Creat: 0.66 mg/dL (ref 0.50–1.05)
GFR, Est African American: 114 mL/min/{1.73_m2} (ref >=60)
GFR, Est Non African American: 99 mL/min/{1.73_m2} (ref >=60)
Glucose, Bld: 127 mg/dL — ABNORMAL HIGH (ref 65–99)
Potassium: 4.3 mmol/L (ref 3.5–5.3)
Sodium: 140 mmol/L (ref 135–146)

## 2020-02-02 LAB — POCT UA - MICROALBUMIN
Albumin/Creatinine Ratio, Urine, POC: <30
Creatinine, POC: 200 mg/dL
Microalbumin Ur, POC: 30 mg/L

## 2020-02-02 MED ORDER — FREESTYLE LANCETS MISC: 12 refills | Status: DC

## 2020-02-02 MED ORDER — FREESTYLE PRECISION NEO TEST VI STRP: ORAL_STRIP | 12 refills | Status: DC

## 2020-02-02 MED ORDER — VICTOZA 18 MG/3ML ~~LOC~~ SOPN: 0.6000 mg | PEN_INJECTOR | Freq: Every day | SUBCUTANEOUS | 3 refills | Status: DC

## 2020-02-02 MED ORDER — FREESTYLE LANCETS MISC: 12 refills | Status: AC

## 2020-02-02 NOTE — Progress Notes (Signed)
Established Patient Office Visit  Subjective:  Patient ID: Nicole Mata, female    DOB: 1960-12-19  Age: 59 y.o. MRN: [id number]  CC:  Chief Complaint  Patient presents with  . Diabetes  . Hyperlipidemia    HPI Nicole Mata presents for   Diabetes - no hypoglycemic events. No wounds or sores that are not healing well. No increased thirst or urination. Checking glucose at home. Taking medications as prescribed without any side effects. Stopped the glipizide bc of hypoglycemia.  Feels the trulicity was giving him heartburn and chest discomfort. Her chest has been tender to touch. She takes a PPI daily, Dexilant.  Would like to try something else.  Has already tried ozempic in the past.   Hyperlipidemia - doesn't tolerate satins.  Had  Myalgias.  Lab Results  Component Value Date   CHOL 156 07/14/2019   HDL 43 (L) 07/14/2019   LDLCALC 89 07/14/2019   TRIG 147 07/14/2019   CHOLHDL 3.6 07/14/2019      Past Medical History:  Diagnosis Date  . Ankle fracture, right 04/12/13  . Diabetes mellitus without complication ([place])   . Diverticulosis   . Gastropathy    reactive  . GERD (gastroesophageal reflux disease)   . Helicobacter pylori gastritis   . Hiatal hernia   . IBS (irritable bowel syndrome)   . Ileitis   . PONV (postoperative nausea and vomiting)     Past Surgical History:  Procedure Laterality Date  . CHOLECYSTECTOMY  2001  . LAPAROSCOPY  11/19/2012   Procedure: LAPAROSCOPY DIAGNOSTIC;  Surgeon: Joyice Faster. Cornett, MD;  Location: [facility];  Service: General;  Laterality: N/A;  Diagnostic laparoscopy  . SHOULDER SURGERY  2002, 2003   bilateral for bone spurs and torn RC  . UMBILICAL HERNIA REPAIR  11/19/2012   Procedure: LAPAROSCOPIC UMBILICAL HERNIA;  Surgeon: Joyice Faster. Cornett, MD;  Location: [facility];  Service: General;  Laterality: N/A;  ventral hernia repair with mesh    Family History  Problem Relation Age of Onset  . Heart disease Other        family  disease  . Heart attack Father 9       grandparents  . Hypertension Father   . Hyperlipidemia Father   . Diabetes Maternal Grandmother   . Diabetes Maternal Uncle   . Ovarian cancer Maternal Aunt        x 2  . Hypertension Mother   . Hyperlipidemia Mother   . Diabetes type II Mother   . Diabetes Brother   . Ovarian cancer Maternal Aunt   . Colon cancer Neg Hx     Social History   Socioeconomic History  . Marital status: Married    Spouse name: Not on file  . Number of children: 0  . Years of education: Not on file  . Highest education level: Not on file  Occupational History  . Occupation: IRA specialist  Tobacco Use  . Smoking status: Never Smoker  . Smokeless tobacco: Never Used  Substance and Sexual Activity  . Alcohol use: Yes    Comment: very rarely  . Drug use: No  . Sexual activity: Yes    Partners: Male    Comment: divorced, no kids, customer service, 2 caffeine drinks daily.  Other Topics Concern  . Not on file  Social History Narrative   1 can of soda a day.  No regular exercise.    Social Determinants of Health   Financial Resource Strain:   .  Difficulty of Paying Living Expenses: Not on file  Food Insecurity:   . Worried About Charity fundraiser in the Last Year: Not on file  . Ran Out of Food in the Last Year: Not on file  Transportation Needs:   . Lack of Transportation (Medical): Not on file  . Lack of Transportation (Non-Medical): Not on file  Physical Activity:   . Days of Exercise per Week: Not on file  . Minutes of Exercise per Session: Not on file  Stress:   . Feeling of Stress : Not on file  Social Connections:   . Frequency of Communication with Friends and Family: Not on file  . Frequency of Social Gatherings with Friends and Family: Not on file  . Attends Religious Services: Not on file  . Active Member of Clubs or Organizations: Not on file  . Attends Archivist Meetings: Not on file  . Marital Status: Not on file   Intimate Partner Violence:   . Fear of Current or Ex-Partner: Not on file  . Emotionally Abused: Not on file  . Physically Abused: Not on file  . Sexually Abused: Not on file    Outpatient Medications Prior to Visit  Medication Sig Dispense Refill  . allopurinol (ZYLOPRIM) 100 MG tablet Take 1 tablet by mouth daily.    Marland Kitchen azelastine (ASTELIN) 0.1 % nasal spray Place 2 sprays into both nostrils 2 (two) times daily. Use in each nostril as directed 30 mL 12  . Continuous Blood Gluc Receiver (FREESTYLE LIBRE 14 DAY READER) DEVI 1 applicator by Does not apply route daily. Dx Dm E11.9 Check fasting blood glucose every morning and before meals. 1 each prn  . Continuous Blood Gluc Sensor (FREESTYLE LIBRE 14 DAY SENSOR) MISC Dx Dm E11.9 Check fasting blood glucose every morning and before meals. 1 each prn  . DEXILANT 60 MG capsule Take 1 capsule (60 mg total) by mouth daily before breakfast. 30 capsule 10  . fluticasone (FLONASE) 50 MCG/ACT nasal spray Place 2 sprays into the nose daily. 16 g 2  . glipiZIDE (GLUCOTROL) 10 MG tablet Take 10 mg by mouth 2 (two) times daily before a meal.    . Insulin Pen Needle (B-D ULTRAFINE III SHORT PEN) 31G X 8 MM MISC INJECT INTO THE SKIN ONCE DAILY AS DIRECTED 100 each PRN  . JANUMET XR [phone] MG TB24 TAKE 1 TABLET BY MOUTH DAILY 90 tablet 1  . ketotifen (ZADITOR) 0.025 % ophthalmic solution Place 1 drop into both eyes every morning.    . ondansetron (ZOFRAN-ODT) 4 MG disintegrating tablet Take 1 tablet (4 mg total) by mouth every 8 (eight) hours as needed for nausea or vomiting. 15 tablet 0  . pregabalin (LYRICA) 25 MG capsule Take 1 capsule (25 mg total) by mouth 2 (two) times daily. 180 capsule 3  . Blood Glucose Monitoring Suppl (BAYER CONTOUR MONITOR) w/Device KIT Use to check blood sugar one time daily. Dx: E11.65. DUE FOR FOLLOW UP 1 kit 0  . cefdinir (OMNICEF) 300 MG capsule Take 1 capsule (300 mg total) by mouth 2 (two) times daily. 60 capsule 0  .  Dulaglutide (TRULICITY) 5.03 UU/8.2CM SOPN Inject 0.75 mg into the skin once a week. 4 pen 2  . glucose blood (BAYER CONTOUR TEST) test strip For checking blood sugar once daily. Dx:E11.9 100 each 11  . Microlet Lancets MISC USE TO CHECK SUGAR ONCE DAILY 100 each 6   No facility-administered medications prior to visit.  Allergies  Allergen Reactions  . Penicillins Anaphylaxis  . Sulfa Drugs Cross Reactors Anaphylaxis  . Contrast Media [Iodinated Diagnostic Agents] Hives  . Atorvastatin Other (See Comments)    Muscle aches  . Gabapentin Other (See Comments)    GERD  . Januvia [Sitagliptin] Other (See Comments)    Hot flashes and flushed and dizzy  . Morphine And Related Nausea And Vomiting    Pt chooses not to take it  . Prednisone     Other reaction(s): Confusion  . Azithromycin Other (See Comments)    Other reaction(s): Other Makes her feel really crazy. Makes her feel really crazy.  Marland Kitchen Hydrocodone Nausea And Vomiting  . Invokana [Canagliflozin] Rash    Rash starting on her face    ROS Review of Systems    Objective:    Physical Exam  Constitutional: She is oriented to person, place, and time. She appears well-developed and well-nourished.  HENT:  Head: Normocephalic and atraumatic.  Cardiovascular: Normal rate, regular rhythm and normal heart sounds.  Pulmonary/Chest: Effort normal and breath sounds normal.  Neurological: She is alert and oriented to person, place, and time.  Skin: Skin is warm and dry.  Psychiatric: She has a normal mood and affect. Her behavior is normal.    BP 131/73   Pulse 89   Ht '5\' 7"'  (1.702 m)   Wt 203 lb (92.1 kg)   SpO2 98%   BMI 31.79 kg/m  Wt Readings from Last 3 Encounters:  02/02/20 203 lb (92.1 kg)  11/17/19 207 lb 1.9 oz (93.9 kg)  10/13/19 203 lb (92.1 kg)     There are no preventive care reminders to display for this patient.  There are no preventive care reminders to display for this patient.  Lab Results   Component Value Date   TSH 1.316 10/13/2013   Lab Results  Component Value Date   WBC 9.8 12/14/2017   HGB 13.2 12/14/2017   HCT 38.2 12/14/2017   MCV 85.8 12/14/2017   PLT 395 12/14/2017   Lab Results  Component Value Date   NA 139 07/14/2019   K 4.5 07/14/2019   CO2 31 07/14/2019   GLUCOSE 112 (H) 07/14/2019   BUN 15 07/14/2019   CREATININE 0.76 07/14/2019   BILITOT 0.4 07/14/2019   ALKPHOS 74 07/18/2017   AST 16 07/14/2019   ALT 10 07/14/2019   PROT 7.1 07/14/2019   ALBUMIN 3.9 07/18/2017   CALCIUM 9.5 07/14/2019   Lab Results  Component Value Date   CHOL 156 07/14/2019   Lab Results  Component Value Date   HDL 43 (L) 07/14/2019   Lab Results  Component Value Date   LDLCALC 89 07/14/2019   Lab Results  Component Value Date   TRIG 147 07/14/2019   Lab Results  Component Value Date   CHOLHDL 3.6 07/14/2019   Lab Results  Component Value Date   HGBA1C 6.4 (A) 02/02/2020      Assessment & Plan:   Problem List Items Addressed This Visit      Endocrine   Diabetic peripheral neuropathy ([place])   Relevant Medications   liraglutide (VICTOZA) 18 MG/3ML SOPN   Other Relevant Orders   BASIC METABOLIC PANEL WITH GFR   Controlled diabetes mellitus type 2 with complications ([place]) - Primary    Will try changing to victoza and see if less side effects with her bowels.  F/U in 3-4 months   Lab Results  Component Value Date   HGBA1C 6.4 (A)  02/02/2020         Relevant Medications   liraglutide (VICTOZA) 18 MG/3ML SOPN   Lancets (FREESTYLE) lancets   Other Relevant Orders   BASIC METABOLIC PANEL WITH GFR   POCT glycosylated hemoglobin (Hb A1C) (Completed)   POCT UA - Microalbumin (Completed)     Other   Hyperlipidemia    Other Visit Diagnoses    Atypical chest pain         Atypical chest pain - Possible GERD but she is taking her PPI daily. She feels it is a side effect of the Trulicity. If not improving then consider further cardiac workup.     Meds ordered this encounter  Medications  . DISCONTD: glucose blood (FREESTYLE PRECISION NEO TEST) test strip    Sig: Use as instructed. DX: E11.9    Dispense:  100 each    Refill:  12  . DISCONTD: Lancets (FREESTYLE) lancets    Sig: Use as instructed DX: E11.9    Dispense:  100 each    Refill:  12  . DISCONTD: Lancets (FREESTYLE) lancets    Sig: Use as instructed DX: E11.9. Please make sure had the dispenser/cartridge. Thank you    Dispense:  100 each    Refill:  12  . liraglutide (VICTOZA) 18 MG/3ML SOPN    Sig: Inject 0.1 mLs (0.6 mg total) into the skin daily. OK to increase to 1.2 mg after 1 week and then 1.67m after an additional week.    Dispense:  12 mL    Refill:  3  . Lancets (FREESTYLE) lancets    Sig: For testing blood sugars up to 2 times daily. DX: E11.9. Please make sure had the dispenser/cartridge. Thank you    Dispense:  100 each    Refill:  12    Follow-up: Return in about 3 months (around 05/04/2020) for Diabetes follow-up.    CBeatrice Lecher MD

## 2020-02-02 NOTE — Progress Notes (Signed)
All labs are normal. 

## 2020-02-02 NOTE — Assessment & Plan Note (Signed)
Will try changing to victoza and see if less side effects with her bowels.  F/U in 3-4 months   Lab Results  Component Value Date   HGBA1C 6.4 (A) 02/02/2020

## 2020-02-04 ENCOUNTER — Other Ambulatory Visit: Payer: Self-pay

## 2020-02-04 ENCOUNTER — Encounter: Payer: Self-pay | Admitting: Family Medicine

## 2020-02-04 ENCOUNTER — Ambulatory Visit (INDEPENDENT_AMBULATORY_CARE_PROVIDER_SITE_OTHER): Payer: BC Managed Care – PPO | Admitting: Family Medicine

## 2020-02-04 VITALS — BP 120/67 | HR 83 | Ht 67.0 in | Wt 203.0 lb

## 2020-02-04 DIAGNOSIS — M7989 Other specified soft tissue disorders: Secondary | ICD-10-CM

## 2020-02-04 LAB — CBC WITH DIFFERENTIAL/PLATELET
Absolute Monocytes: 487 cells/uL (ref 200–950)
Basophils Absolute: 61 cells/uL (ref 0–200)
Basophils Relative: 0.7 %
Eosinophils Absolute: 296 cells/uL (ref 15–500)
Eosinophils Relative: 3.4 %
HCT: 37.2 % (ref 35.0–45.0)
Hemoglobin: 12.5 g/dL (ref 11.7–15.5)
Lymphs Abs: 2323 cells/uL (ref 850–3900)
MCH: 30.1 pg (ref 27.0–33.0)
MCHC: 33.6 g/dL (ref 32.0–36.0)
MCV: 89.6 fL (ref 80.0–100.0)
MPV: 10.7 fL (ref 7.5–12.5)
Monocytes Relative: 5.6 %
Neutro Abs: 5533 cells/uL (ref 1500–7800)
Neutrophils Relative %: 63.6 %
Platelets: 308 10*3/uL (ref 140–400)
RBC: 4.15 10*6/uL (ref 3.80–5.10)
RDW: 12.6 % (ref 11.0–15.0)
Total Lymphocyte: 26.7 %
WBC: 8.7 10*3/uL (ref 3.8–10.8)

## 2020-02-04 LAB — SEDIMENTATION RATE: Sed Rate: 28 mm/h (ref 0–30)

## 2020-02-04 LAB — URIC ACID: Uric Acid, Serum: 6 mg/dL (ref 2.5–7.0)

## 2020-02-04 MED ORDER — DOXYCYCLINE HYCLATE 100 MG PO TABS: 100.0000 mg | ORAL_TABLET | Freq: Two times a day (BID) | ORAL | 0 refills | Status: DC

## 2020-02-04 NOTE — Progress Notes (Signed)
Pt reports that her R hand middle finger distal intraphalangeal joint is swollen and red. She denies any injury/trauma to the joint. She accidentally hit the area this morning and she experienced some tingling that went up her arm. This has since stopped. She hasn't used anything on the area.

## 2020-02-04 NOTE — Progress Notes (Signed)
Acute Office Visit  Subjective:    Patient ID: Nicole Mata, female    DOB: 02-Sep-1961, 59 y.o.   MRN: [id number]  Chief Complaint  Patient presents with  . Hand Pain    R hand middle finger    HPI Patient is in today for Pt reports that her R hand middle finger distal intraphalangeal joint is swollen and red. She says it felt a little sore last night but then this morning it felt stiff and noticed it looked red.  She denies any injury/trauma to the joint. She accidentally hit the area this morning and she experienced some tingling that went up her arm. The tingling lasted about 30 min. This has since stopped. She hasn't used anything on the area.   Past Medical History:  Diagnosis Date  . Ankle fracture, right 04/12/13  . Diabetes mellitus without complication ([place])   . Diverticulosis   . Gastropathy    reactive  . GERD (gastroesophageal reflux disease)   . Helicobacter pylori gastritis   . Hiatal hernia   . IBS (irritable bowel syndrome)   . Ileitis   . PONV (postoperative nausea and vomiting)     Past Surgical History:  Procedure Laterality Date  . CHOLECYSTECTOMY  2001  . LAPAROSCOPY  11/19/2012   Procedure: LAPAROSCOPY DIAGNOSTIC;  Surgeon: Joyice Faster. Cornett, MD;  Location: [facility];  Service: General;  Laterality: N/A;  Diagnostic laparoscopy  . SHOULDER SURGERY  2002, 2003   bilateral for bone spurs and torn RC  . UMBILICAL HERNIA REPAIR  11/19/2012   Procedure: LAPAROSCOPIC UMBILICAL HERNIA;  Surgeon: Joyice Faster. Cornett, MD;  Location: [facility];  Service: General;  Laterality: N/A;  ventral hernia repair with mesh    Family History  Problem Relation Age of Onset  . Heart disease Other        family disease  . Heart attack Father 22       grandparents  . Hypertension Father   . Hyperlipidemia Father   . Diabetes Maternal Grandmother   . Diabetes Maternal Uncle   . Ovarian cancer Maternal Aunt        x 2  . Hypertension Mother   . Hyperlipidemia Mother   .  Diabetes type II Mother   . Diabetes Brother   . Ovarian cancer Maternal Aunt   . Colon cancer Neg Hx     Social History   Socioeconomic History  . Marital status: Married    Spouse name: Not on file  . Number of children: 0  . Years of education: Not on file  . Highest education level: Not on file  Occupational History  . Occupation: IRA specialist  Tobacco Use  . Smoking status: Never Smoker  . Smokeless tobacco: Never Used  Substance and Sexual Activity  . Alcohol use: Yes    Comment: very rarely  . Drug use: No  . Sexual activity: Yes    Partners: Male    Comment: divorced, no kids, customer service, 2 caffeine drinks daily.  Other Topics Concern  . Not on file  Social History Narrative   1 can of soda a day.  No regular exercise.    Social Determinants of Health   Financial Resource Strain:   . Difficulty of Paying Living Expenses: Not on file  Food Insecurity:   . Worried About Charity fundraiser in the Last Year: Not on file  . Ran Out of Food in the Last Year: Not on file  Transportation Needs:   . Film/video editor (Medical): Not on file  . Lack of Transportation (Non-Medical): Not on file  Physical Activity:   . Days of Exercise per Week: Not on file  . Minutes of Exercise per Session: Not on file  Stress:   . Feeling of Stress : Not on file  Social Connections:   . Frequency of Communication with Friends and Family: Not on file  . Frequency of Social Gatherings with Friends and Family: Not on file  . Attends Religious Services: Not on file  . Active Member of Clubs or Organizations: Not on file  . Attends Archivist Meetings: Not on file  . Marital Status: Not on file  Intimate Partner Violence:   . Fear of Current or Ex-Partner: Not on file  . Emotionally Abused: Not on file  . Physically Abused: Not on file  . Sexually Abused: Not on file    Outpatient Medications Prior to Visit  Medication Sig Dispense Refill  . allopurinol  (ZYLOPRIM) 100 MG tablet Take 1 tablet by mouth daily.    Marland Kitchen azelastine (ASTELIN) 0.1 % nasal spray Place 2 sprays into both nostrils 2 (two) times daily. Use in each nostril as directed 30 mL 12  . Continuous Blood Gluc Receiver (FREESTYLE LIBRE 14 DAY READER) DEVI 1 applicator by Does not apply route daily. Dx Dm E11.9 Check fasting blood glucose every morning and before meals. 1 each prn  . Continuous Blood Gluc Sensor (FREESTYLE LIBRE 14 DAY SENSOR) MISC Dx Dm E11.9 Check fasting blood glucose every morning and before meals. 1 each prn  . DEXILANT 60 MG capsule Take 1 capsule (60 mg total) by mouth daily before breakfast. 30 capsule 10  . fluticasone (FLONASE) 50 MCG/ACT nasal spray Place 2 sprays into the nose daily. 16 g 2  . glipiZIDE (GLUCOTROL) 10 MG tablet Take 10 mg by mouth 2 (two) times daily before a meal.    . glucose blood (FREESTYLE PRECISION NEO TEST) test strip For testing blood sugars up to 2 times daily. DX: E11.9 100 each 12  . Insulin Pen Needle (B-D ULTRAFINE III SHORT PEN) 31G X 8 MM MISC INJECT INTO THE SKIN ONCE DAILY AS DIRECTED 100 each PRN  . JANUMET XR [phone] MG TB24 TAKE 1 TABLET BY MOUTH DAILY 90 tablet 1  . ketotifen (ZADITOR) 0.025 % ophthalmic solution Place 1 drop into both eyes every morning.    . Lancets (FREESTYLE) lancets For testing blood sugars up to 2 times daily. DX: E11.9. Please make sure had the dispenser/cartridge. Thank you 100 each 12  . liraglutide (VICTOZA) 18 MG/3ML SOPN Inject 0.1 mLs (0.6 mg total) into the skin daily. OK to increase to 1.2 mg after 1 week and then 1.8mg  after an additional week. 12 mL 3  . ondansetron (ZOFRAN-ODT) 4 MG disintegrating tablet Take 1 tablet (4 mg total) by mouth every 8 (eight) hours as needed for nausea or vomiting. 15 tablet 0  . pregabalin (LYRICA) 25 MG capsule Take 1 capsule (25 mg total) by mouth 2 (two) times daily. 180 capsule 3   No facility-administered medications prior to visit.    Allergies   Allergen Reactions  . Penicillins Anaphylaxis  . Sulfa Drugs Cross Reactors Anaphylaxis  . Contrast Media [Iodinated Diagnostic Agents] Hives  . Atorvastatin Other (See Comments)    Muscle aches  . Gabapentin Other (See Comments)    GERD  . Januvia [Sitagliptin] Other (See Comments)    Hot  flashes and flushed and dizzy  . Morphine And Related Nausea And Vomiting    Pt chooses not to take it  . Prednisone     Other reaction(s): Confusion  . Azithromycin Other (See Comments)    Other reaction(s): Other Makes her feel really crazy. Makes her feel really crazy.  Marland Kitchen Hydrocodone Nausea And Vomiting  . Invokana [Canagliflozin] Rash    Rash starting on her face    Review of Systems     Objective:    Physical Exam Vitals reviewed.  Constitutional:      Appearance: She is well-developed.  HENT:     Head: Normocephalic and atraumatic.  Eyes:     Conjunctiva/sclera: Conjunctivae normal.  Cardiovascular:     Rate and Rhythm: Normal rate.  Pulmonary:     Effort: Pulmonary effort is normal.  Musculoskeletal:     Comments: Left hand with no significant abnormalities or swelling.  The DIP joint of the middle finger is mildly swollen and erythematous posteriorly directly over the joint.  It is tender to palpation.  I do not see any cuts or lesions or wounds.  Right now she does have normal flexion and strength in that finger.  I do not palpate any lesions on the flexor or extensor tendon to that finger.  Radial pulses 2+.  Normal range of motion of the wrist as well.  No other significantly swollen or erythematous joints.  Skin:    General: Skin is dry.     Coloration: Skin is not pale.  Neurological:     Mental Status: She is alert and oriented to person, place, and time.  Psychiatric:        Behavior: Behavior normal.     BP 120/67   Pulse 83   Ht 5\' 7"  (1.702 m)   Wt 203 lb (92.1 kg)   SpO2 100%   BMI 31.79 kg/m  Wt Readings from Last 3 Encounters:  02/04/20 203 lb (92.1  kg)  02/02/20 203 lb (92.1 kg)  11/17/19 207 lb 1.9 oz (93.9 kg)    There are no preventive care reminders to display for this patient.  There are no preventive care reminders to display for this patient.   Lab Results  Component Value Date   TSH 1.316 10/13/2013   Lab Results  Component Value Date   WBC 9.8 12/14/2017   HGB 13.2 12/14/2017   HCT 38.2 12/14/2017   MCV 85.8 12/14/2017   PLT 395 12/14/2017   Lab Results  Component Value Date   NA 140 02/02/2020   K 4.3 02/02/2020   CO2 31 02/02/2020   GLUCOSE 127 (H) 02/02/2020   BUN 12 02/02/2020   CREATININE 0.66 02/02/2020   BILITOT 0.4 07/14/2019   ALKPHOS 74 07/18/2017   AST 16 07/14/2019   ALT 10 07/14/2019   PROT 7.1 07/14/2019   ALBUMIN 3.9 07/18/2017   CALCIUM 9.8 02/02/2020   Lab Results  Component Value Date   CHOL 156 07/14/2019   Lab Results  Component Value Date   HDL 43 (L) 07/14/2019   Lab Results  Component Value Date   LDLCALC 89 07/14/2019   Lab Results  Component Value Date   TRIG 147 07/14/2019   Lab Results  Component Value Date   CHOLHDL 3.6 07/14/2019   Lab Results  Component Value Date   HGBA1C 6.4 (A) 02/02/2020       Assessment & Plan:   Problem List Items Addressed This Visit    None  Visit Diagnoses    Finger swelling    -  Primary   Relevant Medications   doxycycline (VIBRA-TABS) 100 MG tablet   Other Relevant Orders   CBC with Differential/Platelet   Uric acid   Sedimentation rate     Middle finger DIP joint is erythematous and mildly swollen-I do not see any injury or wound to cause an infection but I am concerned about the possibility of infection.  Start oral doxycycline for now.  In the meantime we will check a CBC uric acid and sed rate to look for inflammatory arthritis as well as gout.  She is never had any major joint flares or swelling in her feet.  If the doxycycline is not effective then consider a round of prednisone.  Call if she feels like it  is getting worse.  Okay to use ibuprofen and/or Aleve or Tylenol as needed.  Meds ordered this encounter  Medications  . doxycycline (VIBRA-TABS) 100 MG tablet    Sig: Take 1 tablet (100 mg total) by mouth 2 (two) times daily.    Dispense:  20 tablet    Refill:  0     Beatrice Lecher, MD

## 2020-02-05 ENCOUNTER — Telehealth: Payer: Self-pay | Admitting: Family Medicine

## 2020-02-05 DIAGNOSIS — E118 Type 2 diabetes mellitus with unspecified complications: Secondary | ICD-10-CM

## 2020-02-05 NOTE — Telephone Encounter (Signed)
Pharmacy calls and states that they need a new prescription for the [facility] Precision Neao test strips. The one sent stated use as directed and they need it to say max daily usage.  Also needs a precription for the Lancing devices sent.  KG LPN

## 2020-02-05 NOTE — Telephone Encounter (Signed)
I am confused. The script does have a max daily dosage. It says up to twice a day.  Does it need to say once a day?

## 2020-02-05 NOTE — Telephone Encounter (Signed)
It needs to say exactly how many times she test a day not up to so many times a day.  KG LPN

## 2020-02-06 ENCOUNTER — Other Ambulatory Visit: Payer: Self-pay | Admitting: *Deleted

## 2020-02-06 DIAGNOSIS — E118 Type 2 diabetes mellitus with unspecified complications: Secondary | ICD-10-CM

## 2020-02-06 MED ORDER — FREESTYLE PRECISION NEO TEST VI STRP: ORAL_STRIP | 12 refills | Status: DC

## 2020-02-06 NOTE — Telephone Encounter (Signed)
New rx printed. Please fax.

## 2020-02-06 NOTE — Telephone Encounter (Signed)
Tonya, do you have this?

## 2020-02-08 ENCOUNTER — Other Ambulatory Visit: Payer: Self-pay | Admitting: Family Medicine

## 2020-02-09 ENCOUNTER — Ambulatory Visit: Payer: BC Managed Care – PPO | Admitting: Family Medicine

## 2020-02-09 NOTE — Telephone Encounter (Signed)
This was sent directly to her pharmacy.Nicole Mata

## 2020-02-10 ENCOUNTER — Telehealth: Payer: Self-pay | Admitting: Family Medicine

## 2020-02-10 NOTE — Telephone Encounter (Signed)
Received fax for [place] on Freestyle Precision Neo strip sent through cover my meds waiting on determination. - CF

## 2020-02-16 NOTE — Telephone Encounter (Signed)
Received fax from [facility] they denied coverage on Freestyle Precision Neo Test VI Strip. They will approve only when the use of a different test strip may place member at risk of harm. Placing in Providers box for review. - CF

## 2020-02-29 ENCOUNTER — Other Ambulatory Visit: Payer: Self-pay | Admitting: Family Medicine

## 2020-02-29 DIAGNOSIS — E118 Type 2 diabetes mellitus with unspecified complications: Secondary | ICD-10-CM

## 2020-03-09 ENCOUNTER — Other Ambulatory Visit: Payer: Self-pay | Admitting: Family Medicine

## 2020-03-09 ENCOUNTER — Other Ambulatory Visit: Payer: Self-pay

## 2020-03-19 ENCOUNTER — Encounter: Payer: Self-pay | Admitting: Podiatry

## 2020-03-19 ENCOUNTER — Ambulatory Visit (INDEPENDENT_AMBULATORY_CARE_PROVIDER_SITE_OTHER): Payer: BC Managed Care – PPO | Admitting: Podiatry

## 2020-03-19 ENCOUNTER — Other Ambulatory Visit: Payer: Self-pay

## 2020-03-19 VITALS — BP 141/92 | HR 88 | Temp 97.2°F

## 2020-03-19 DIAGNOSIS — M25571 Pain in right ankle and joints of right foot: Secondary | ICD-10-CM

## 2020-03-19 DIAGNOSIS — IMO0001 Reserved for inherently not codable concepts without codable children: Secondary | ICD-10-CM

## 2020-03-19 DIAGNOSIS — S93401A Sprain of unspecified ligament of right ankle, initial encounter: Secondary | ICD-10-CM | POA: Diagnosis not present

## 2020-03-19 NOTE — Progress Notes (Signed)
Subjective:  Patient ID: Nicole Mata, female    DOB: July 31, 1961,  MRN: [id number]  Chief Complaint  Patient presents with  . Diabetes    diabetic foot exam, Aic 6.4, sugars run 120-140  . Peripheral Neuropathy    seems to be worse at night    59 y.o. female presents with the above complaint.  Patient presents with mild discomfort to the right lateral ankle.  Patient states that she underwent a ankle sprain couple of days ago and it caused her pain in the lateral part of the foot.  However she did not have any bruising and she was able to walk on her right away.  She states the pain has gone down considerably.  She just wanted to get evaluated to make sure that there was not any underlying fracture.  She is also diabetic with neuropathy which is tend to be worse at night.  Her sugars are well controlled with last A1c of 6.4.  She denies any other acute complaints.  She now has mild pain to the lateral ankle to the right foot.  It is more throbbing in nature.  Pain scale 6 out of 10.   Review of Systems: Negative except as noted in the HPI. Denies N/V/F/Ch.  Past Medical History:  Diagnosis Date  . Ankle fracture, right 04/12/13  . Diabetes mellitus without complication ([place])   . Diverticulosis   . Gastropathy    reactive  . GERD (gastroesophageal reflux disease)   . Helicobacter pylori gastritis   . Hiatal hernia   . IBS (irritable bowel syndrome)   . Ileitis   . PONV (postoperative nausea and vomiting)     Current Outpatient Medications:  .  allopurinol (ZYLOPRIM) 100 MG tablet, Take 1 tablet by mouth daily., Disp: , Rfl:  .  azelastine (ASTELIN) 0.1 % nasal spray, Place 2 sprays into both nostrils 2 (two) times daily. Use in each nostril as directed, Disp: 30 mL, Rfl: 12 .  CONTOUR TEST test strip, FOR CHECKING BLOOD SUGAR ONCE DAILY, Disp: 100 strip, Rfl: 12 .  DEXILANT 60 MG capsule, Take 1 capsule (60 mg total) by mouth daily before breakfast., Disp: 30 capsule, Rfl: 10 .   doxycycline (VIBRA-TABS) 100 MG tablet, Take 1 tablet (100 mg total) by mouth 2 (two) times daily., Disp: 20 tablet, Rfl: 0 .  fluticasone (FLONASE) 50 MCG/ACT nasal spray, Place 2 sprays into the nose daily., Disp: 16 g, Rfl: 2 .  gabapentin (NEURONTIN) 100 MG capsule, Take 100 mg by mouth at bedtime., Disp: , Rfl:  .  glipiZIDE (GLUCOTROL) 10 MG tablet, Take 10 mg by mouth 2 (two) times daily before a meal., Disp: , Rfl:  .  Insulin Pen Needle (B-D ULTRAFINE III SHORT PEN) 31G X 8 MM MISC, INJECT INTO THE SKIN ONCE DAILY AS DIRECTED, Disp: 100 each, Rfl: PRN .  JANUMET XR [phone] MG TB24, TAKE 1 TABLET BY MOUTH DAILY, Disp: 90 tablet, Rfl: 1 .  ketotifen (ZADITOR) 0.025 % ophthalmic solution, Place 1 drop into both eyes every morning., Disp: , Rfl:  .  Lancets (FREESTYLE) lancets, For testing blood sugars up to 2 times daily. DX: E11.9. Please make sure had the dispenser/cartridge. Thank you, Disp: 100 each, Rfl: 12 .  liraglutide (VICTOZA) 18 MG/3ML SOPN, Inject 0.1 mLs (0.6 mg total) into the skin daily. OK to increase to 1.2 mg after 1 week and then 1.8mg  after an additional week., Disp: 12 mL, Rfl: 3 .  ondansetron (ZOFRAN-ODT)  4 MG disintegrating tablet, Take 1 tablet (4 mg total) by mouth every 8 (eight) hours as needed for nausea or vomiting., Disp: 15 tablet, Rfl: 0 .  pregabalin (LYRICA) 25 MG capsule, Take 1 capsule (25 mg total) by mouth 2 (two) times daily., Disp: 180 capsule, Rfl: 3  Social History   Tobacco Use  Smoking Status Never Smoker  Smokeless Tobacco Never Used    Allergies  Allergen Reactions  . Penicillins Anaphylaxis  . Sulfa Drugs Cross Reactors Anaphylaxis  . Contrast Media [Iodinated Diagnostic Agents] Hives  . Atorvastatin Other (See Comments)    Muscle aches  . Gabapentin Other (See Comments)    GERD  . Januvia [Sitagliptin] Other (See Comments)    Hot flashes and flushed and dizzy  . Morphine And Related Nausea And Vomiting    Pt chooses not to take it   . Prednisone     Other reaction(s): Confusion  . Azithromycin Other (See Comments)    Other reaction(s): Other Makes her feel really crazy. Makes her feel really crazy.  Marland Kitchen Hydrocodone Nausea And Vomiting  . Invokana [Canagliflozin] Rash    Rash starting on her face   Objective:   Vitals:   03/19/20 0931  BP: (!) 141/92  Pulse: 88  Temp: (!) 97.2 F (36.2 C)   There is no height or weight on file to calculate BMI. Constitutional Well developed. Well nourished.  Vascular Dorsalis pedis pulses palpable bilaterally. Posterior tibial pulses palpable bilaterally. Capillary refill normal to all digits.  No cyanosis or clubbing noted. Pedal hair growth normal.  Neurologic Normal speech. Oriented to person, place, and time. Epicritic sensation to light touch grossly present bilaterally.  Dermatologic Nails well groomed and normal in appearance. No open wounds. No skin lesions.  Orthopedic:  Pain on palpation to the right lateral ATFL region.  Pain with inversion and plantarflexion of the foot active and passive range of motion.  No pain at the posterior tibial tendon, Achilles tendon, peroneal tendon.  Negative anterior drawer sign or talar tilt noted.   Radiographs: 3 views of skeletally mature adult right ankle: Osteoarthritic changes noted at the ankle joint as well as the midfoot joint.  No osseous fractures or radiolucent line noted.  Ankle mortise within normal limits.  No other bony abnormalities identified. Assessment:   1. Grade 1 ankle sprain, right, initial encounter   2. Acute right ankle pain    Plan:  Patient was evaluated and treated and all questions answered.  Right ankle mild sprain -I explained to the patient the etiology of ankle sprain and various treatment options were extensively discussed.  Given that patient only has mild pain and is able to ambulate without any acute issues I believe she will benefit from Tri-Lock ankle brace to provide the stability of  the ankle joint and allow proper healing of the ATFL ligament.  Patient states understanding and would like to proceed with a Tri-Lock ankle brace for stability. -I like ankle brace was dispensed.  Return in about 3 months (around 06/18/2020) for diabetic nail trim.

## 2020-03-19 NOTE — Patient Instructions (Signed)
Diabetes Mellitus and Foot Care Foot care is an important part of your health, especially when you have diabetes. Diabetes may cause you to have problems because of poor blood flow (circulation) to your feet and legs, which can cause your skin to:  Become thinner and drier.  Break more easily.  Heal more slowly.  Peel and crack. You may also have nerve damage (neuropathy) in your legs and feet, causing decreased feeling in them. This means that you may not notice minor injuries to your feet that could lead to more serious problems. Noticing and addressing any potential problems early is the best way to prevent future foot problems. How to care for your feet Foot hygiene  Wash your feet daily with warm water and mild soap. Do not use hot water. Then, pat your feet and the areas between your toes until they are completely dry. Do not soak your feet as this can dry your skin.  Trim your toenails straight across. Do not dig under them or around the cuticle. File the edges of your nails with an emery board or nail file.  Apply a moisturizing lotion or petroleum jelly to the skin on your feet and to dry, brittle toenails. Use lotion that does not contain alcohol and is unscented. Do not apply lotion between your toes. Shoes and socks  Wear clean socks or stockings every day. Make sure they are not too tight. Do not wear knee-high stockings since they may decrease blood flow to your legs.  Wear shoes that fit properly and have enough cushioning. Always look in your shoes before you put them on to be sure there are no objects inside.  To break in new shoes, wear them for just a few hours a day. This prevents injuries on your feet. Wounds, scrapes, corns, and calluses  Check your feet daily for blisters, cuts, bruises, sores, and redness. If you cannot see the bottom of your feet, use a mirror or ask someone for help.  Do not cut corns or calluses or try to remove them with medicine.  If you  find a minor scrape, cut, or break in the skin on your feet, keep it and the skin around it clean and dry. You may clean these areas with mild soap and water. Do not clean the area with peroxide, alcohol, or iodine.  If you have a wound, scrape, corn, or callus on your foot, look at it several times a day to make sure it is healing and not infected. Check for: ? Redness, swelling, or pain. ? Fluid or blood. ? Warmth. ? Pus or a bad smell. General instructions  Do not cross your legs. This may decrease blood flow to your feet.  Do not use heating pads or hot water bottles on your feet. They may burn your skin. If you have lost feeling in your feet or legs, you may not know this is happening until it is too late.  Protect your feet from hot and cold by wearing shoes, such as at the beach or on hot pavement.  Schedule a complete foot exam at least once a year (annually) or more often if you have foot problems. If you have foot problems, report any cuts, sores, or bruises to your health care provider immediately. Contact a health care provider if:  You have a medical condition that increases your risk of infection and you have any cuts, sores, or bruises on your feet.  You have an injury that is not   healing.  You have redness on your legs or feet.  You feel burning or tingling in your legs or feet.  You have pain or cramps in your legs and feet.  Your legs or feet are numb.  Your feet always feel cold.  You have pain around a toenail. Get help right away if:  You have a wound, scrape, corn, or callus on your foot and: ? You have pain, swelling, or redness that gets worse. ? You have fluid or blood coming from the wound, scrape, corn, or callus. ? Your wound, scrape, corn, or callus feels warm to the touch. ? You have pus or a bad smell coming from the wound, scrape, corn, or callus. ? You have a fever. ? You have a red line going up your leg. Summary  Check your feet every day  for cuts, sores, red spots, swelling, and blisters.  Moisturize feet and legs daily.  Wear shoes that fit properly and have enough cushioning.  If you have foot problems, report any cuts, sores, or bruises to your health care provider immediately.  Schedule a complete foot exam at least once a year (annually) or more often if you have foot problems. This information is not intended to replace advice given to you by your health care provider. Make sure you discuss any questions you have with your health care provider. Document Revised: 08/13/2019 Document Reviewed: 12/22/2016 Elsevier Patient Education  2020 [employer]  Peripheral Neuropathy Peripheral neuropathy is a type of nerve damage. It affects nerves that carry signals between the spinal cord and the arms, legs, and the rest of the body (peripheral nerves). It does not affect nerves in the spinal cord or brain. In peripheral neuropathy, one nerve or a group of nerves may be damaged. Peripheral neuropathy is a broad category that includes many specific nerve disorders, like diabetic neuropathy, hereditary neuropathy, and carpal tunnel syndrome. What are the causes? This condition may be caused by:  Diabetes. This is the most common cause of peripheral neuropathy.  Nerve injury.  Pressure or stress on a nerve that lasts a long time.  Lack (deficiency) of B vitamins. This can result from alcoholism, poor diet, or a restricted diet.  Infections.  Autoimmune diseases, such as rheumatoid arthritis and systemic lupus erythematosus.  Nerve diseases that are passed from parent to child (inherited).  Some medicines, such as cancer medicines (chemotherapy).  Poisonous (toxic) substances, such as lead and mercury.  Too little blood flowing to the legs.  Kidney disease.  Thyroid disease. In some cases, the cause of this condition is not known. What are the signs or symptoms? Symptoms of this condition depend on which of your  nerves is damaged. Common symptoms include:  Loss of feeling (numbness) in the feet, hands, or both.  Tingling in the feet, hands, or both.  Burning pain.  Very sensitive skin.  Weakness.  Not being able to move a part of the body (paralysis).  Muscle twitching.  Clumsiness or poor coordination.  Loss of balance.  Not being able to control your bladder.  Feeling dizzy.  Sexual problems. How is this diagnosed? Diagnosing and finding the cause of peripheral neuropathy can be difficult. Your health care provider will take your medical history and do a physical exam. A neurological exam will also be done. This involves checking things that are affected by your brain, spinal cord, and nerves (nervous system). For example, your health care provider will check your reflexes, how you move, and   what you can feel. You may have other tests, such as:  Blood tests.  Electromyogram (EMG) and nerve conduction tests. These tests check nerve function and how well the nerves are controlling the muscles.  Imaging tests, such as CT scans or MRI to rule out other causes of your symptoms.  Removing a small piece of nerve to be examined in a lab (nerve biopsy). This is rare.  Removing and examining a small amount of the fluid that surrounds the brain and spinal cord (lumbar puncture). This is rare. How is this treated? Treatment for this condition may involve:  Treating the underlying cause of the neuropathy, such as diabetes, kidney disease, or vitamin deficiencies.  Stopping medicines that can cause neuropathy, such as chemotherapy.  Medicine to relieve pain. Medicines may include: ? Prescription or over-the-counter pain medicine. ? Antiseizure medicine. ? Antidepressants. ? Pain-relieving patches that are applied to painful areas of skin.  Surgery to relieve pressure on a nerve or to destroy a nerve that is causing pain.  Physical therapy to help improve movement and  balance.  Devices to help you move around (assistive devices). Follow these instructions at home: Medicines  Take over-the-counter and prescription medicines only as told by your health care provider. Do not take any other medicines without first asking your health care provider.  Do not drive or use heavy machinery while taking prescription pain medicine. Lifestyle   Do not use any products that contain nicotine or tobacco, such as cigarettes and e-cigarettes. Smoking keeps blood from reaching damaged nerves. If you need help quitting, ask your health care provider.  Avoid or limit alcohol. Too much alcohol can cause a vitamin B deficiency, and vitamin B is needed for healthy nerves.  Eat a healthy diet. This includes: ? Eating foods that are high in fiber, such as fresh fruits and vegetables, whole grains, and beans. ? Limiting foods that are high in fat and processed sugars, such as fried or sweet foods. General instructions   If you have diabetes, work closely with your health care provider to keep your blood sugar under control.  If you have numbness in your feet: ? Check every day for signs of injury or infection. Watch for redness, warmth, and swelling. ? Wear padded socks and comfortable shoes. These help protect your feet.  Develop a good support system. Living with peripheral neuropathy can be stressful. Consider talking with a mental health specialist or joining a support group.  Use assistive devices and attend physical therapy as told by your health care provider. This may include using a walker or a cane.  Keep all follow-up visits as told by your health care provider. This is important. Contact a health care provider if:  You have new signs or symptoms of peripheral neuropathy.  You are struggling emotionally from dealing with peripheral neuropathy.  Your pain is not well-controlled. Get help right away if:  You have an injury or infection that is not healing  normally.  You develop new weakness in an arm or leg.  You fall frequently. Summary  Peripheral neuropathy is when the nerves in the arms, or legs are damaged, resulting in numbness, weakness, or pain.  There are many causes of peripheral neuropathy, including diabetes, pinched nerves, vitamin deficiencies, autoimmune disease, and hereditary conditions.  Diagnosing and finding the cause of peripheral neuropathy can be difficult. Your health care provider will take your medical history, do a physical exam, and do tests, including blood tests and nerve function tests.    Treatment involves treating the underlying cause of the neuropathy and taking medicines to help control pain. Physical therapy and assistive devices may also help. This information is not intended to replace advice given to you by your health care provider. Make sure you discuss any questions you have with your health care provider. Document Revised: 11/02/2017 Document Reviewed: 01/29/2017 Elsevier Patient Education  2020 [employer]  

## 2020-04-29 DIAGNOSIS — R21 Rash and other nonspecific skin eruption: Secondary | ICD-10-CM | POA: Diagnosis not present

## 2020-05-07 ENCOUNTER — Emergency Department (INDEPENDENT_AMBULATORY_CARE_PROVIDER_SITE_OTHER)
Admission: EM | Admit: 2020-05-07 | Discharge: 2020-05-07 | Disposition: A | Discharge status: Home / Self Care | Payer: BC Managed Care – PPO | Source: Home / Self Care

## 2020-05-07 ENCOUNTER — Other Ambulatory Visit: Payer: Self-pay

## 2020-05-07 ENCOUNTER — Emergency Department (INDEPENDENT_AMBULATORY_CARE_PROVIDER_SITE_OTHER): Payer: BC Managed Care – PPO

## 2020-05-07 DIAGNOSIS — S99921A Unspecified injury of right foot, initial encounter: Secondary | ICD-10-CM | POA: Diagnosis not present

## 2020-05-07 DIAGNOSIS — S92514A Nondisplaced fracture of proximal phalanx of right lesser toe(s), initial encounter for closed fracture: Secondary | ICD-10-CM

## 2020-05-07 DIAGNOSIS — M7989 Other specified soft tissue disorders: Secondary | ICD-10-CM | POA: Diagnosis not present

## 2020-05-07 DIAGNOSIS — M79674 Pain in right toe(s): Secondary | ICD-10-CM

## 2020-05-07 NOTE — Discharge Instructions (Signed)
  You may take 500mg  acetaminophen every 4-6 hours or in combination with ibuprofen 400-600mg  every 6-8 hours as needed for pain and inflammation.  Follow up with family medicine in 2-3 weeks if not improving, sooner if worsening- especially if you notice any skin changes due to your history of diabetes.

## 2020-05-07 NOTE — ED Provider Notes (Signed)
[name]    CSN: [id number] Arrival date & time: 05/07/20  1914      History   Chief Complaint Chief Complaint  Patient presents with  . Toe Pain    2 weeks ago    HPI Nicole Mata is a 59 y.o. female.   HPI  Nicole Mata is a 59 y.o. female presenting to UC with c/o persistent Right little and 4th toe pain that started after she tripped over her dog's leash while hiking and fell. She noticed mild bruising and swelling but it has persisted despite ice and rest. She leaves for a work trip in 2 days to go to [place] and plans to do a lot of walking. Pt wants to make sure she did not break anything. Pt also reports being a diabetic, which has increased her concern for continued toe pain. No prior fracture to same toes.    Past Medical History:  Diagnosis Date  . Ankle fracture, right 04/12/13  . Diabetes mellitus without complication ([place])   . Diverticulosis   . Gastropathy    reactive  . GERD (gastroesophageal reflux disease)   . Helicobacter pylori gastritis   . Hiatal hernia   . IBS (irritable bowel syndrome)   . Ileitis   . PONV (postoperative nausea and vomiting)     Patient Active Problem List   Diagnosis Date Noted  . Kidney stones 07/01/2019  . Lumbar spondylosis 06/21/2017  . Diabetic peripheral neuropathy ([place]) 10/04/2016  . Hyperlipidemia 06/18/2015  . Chondromalacia patellae 12/15/2014  . History of kidney stones 12/15/2014  . Controlled diabetes mellitus type 2 with complications ([place]) 88/91/6945  . Menopausal symptom 06/04/2013  . Umbilical hernia 03/88/8280  . Small bowel disease 10/14/2012  . Disorder of intestine 10/14/2012  . Obese 02/06/2012  . Cervical radiculopathy at C7 01/16/2011  . Brachial neuritis 01/16/2011  . Generalized anxiety disorder 04/15/2010  . Flushing 04/15/2010  . Stiffness of joint, not elsewhere classified, other specified site 01/25/2010  . Allergic rhinitis 01/25/2010  . Vitamin D deficiency  10/12/2009  . Other B-complex deficiencies 10/12/2009  . Pain in joints 10/07/2009  . Rosacea 08/23/2009  . Anxiety state 08/23/2009  . Low back pain 04/22/2009  . GERD 12/17/2008  . Diaphragmatic hernia 12/17/2008  . IRRITABLE BOWEL SYNDROME 12/17/2008  . HELICOBACTER PYLORI GASTRITIS, HX OF 12/17/2008    Past Surgical History:  Procedure Laterality Date  . CHOLECYSTECTOMY  2001  . LAPAROSCOPY  11/19/2012   Procedure: LAPAROSCOPY DIAGNOSTIC;  Surgeon: Joyice Faster. Cornett, MD;  Location: [facility];  Service: General;  Laterality: N/A;  Diagnostic laparoscopy  . SHOULDER SURGERY  2002, 2003   bilateral for bone spurs and torn RC  . UMBILICAL HERNIA REPAIR  11/19/2012   Procedure: LAPAROSCOPIC UMBILICAL HERNIA;  Surgeon: Joyice Faster. Cornett, MD;  Location: [facility];  Service: General;  Laterality: N/A;  ventral hernia repair with mesh    OB History    Gravida  1   Para      Term      Preterm      AB  1   Living        SAB  1   TAB      Ectopic      Multiple      Live Births               Home Medications    Prior to Admission medications   Medication Sig Start Date End Date  Taking? Authorizing Provider  allopurinol (ZYLOPRIM) 100 MG tablet Take 1 tablet by mouth daily. 07/09/19   [provider]  azelastine (ASTELIN) 0.1 % nasal spray Place 2 sprays into both nostrils 2 (two) times daily. Use in each nostril as directed 07/24/19   Gregor Hams, MD  CONTOUR TEST test strip FOR CHECKING BLOOD SUGAR ONCE DAILY 03/01/20   Hali Marry, MD  DEXILANT 60 MG capsule Take 1 capsule (60 mg total) by mouth daily before breakfast. 11/04/19   Hali Marry, MD  doxycycline (VIBRA-TABS) 100 MG tablet Take 1 tablet (100 mg total) by mouth 2 (two) times daily. 02/04/20   Hali Marry, MD  fluticasone (FLONASE) 50 MCG/ACT nasal spray Place 2 sprays into the nose daily. 09/10/13   Breeback, Jade L, PA-C  gabapentin (NEURONTIN) 100 MG capsule Take 100 mg by  mouth at bedtime. 03/09/20   [provider]  glipiZIDE (GLUCOTROL) 10 MG tablet Take 10 mg by mouth 2 (two) times daily before a meal.    [provider]  Insulin Pen Needle (B-D ULTRAFINE III SHORT PEN) 31G X 8 MM MISC INJECT INTO THE SKIN ONCE DAILY AS DIRECTED 02/12/19   Hali Marry, MD  JANUMET XR [id number] MG TB24 TAKE 1 TABLET BY MOUTH DAILY 03/09/20   Hali Marry, MD  ketotifen (ZADITOR) 0.025 % ophthalmic solution Place 1 drop into both eyes every morning.    [provider]  Lancets (FREESTYLE) lancets For testing blood sugars up to 2 times daily. DX: E11.9. Please make sure had the dispenser/cartridge. Thank you 02/02/20   Hali Marry, MD  liraglutide (VICTOZA) 18 MG/3ML SOPN Inject 0.1 mLs (0.6 mg total) into the skin daily. OK to increase to 1.2 mg after 1 week and then 1.8mg  after an additional week. 02/02/20   Hali Marry, MD  ondansetron (ZOFRAN-ODT) 4 MG disintegrating tablet Take 1 tablet (4 mg total) by mouth every 8 (eight) hours as needed for nausea or vomiting. 07/01/19   Hali Marry, MD  pregabalin (LYRICA) 25 MG capsule Take 1 capsule (25 mg total) by mouth 2 (two) times daily. 05/01/19   Hali Marry, MD    Family History Family History  Problem Relation Age of Onset  . Heart disease Other        family disease  . Heart attack Father 50       grandparents  . Hypertension Father   . Hyperlipidemia Father   . Diabetes Maternal Grandmother   . Diabetes Maternal Uncle   . Ovarian cancer Maternal Aunt        x 2  . Hypertension Mother   . Hyperlipidemia Mother   . Diabetes type II Mother   . Diabetes Brother   . Ovarian cancer Maternal Aunt   . Colon cancer Neg Hx     Social History Social History   Tobacco Use  . Smoking status: Never Smoker  . Smokeless tobacco: Never Used  Substance Use Topics  . Alcohol use: Yes    Comment: very rarely  . Drug use: No     Allergies     Penicillins, Sulfa drugs cross reactors, Contrast media [iodinated diagnostic agents], Atorvastatin, Gabapentin, Januvia [sitagliptin], Morphine and related, Prednisone, Azithromycin, Hydrocodone, and Invokana [canagliflozin]   Review of Systems Review of Systems  Musculoskeletal: Positive for arthralgias and joint swelling. Negative for gait problem.  Skin: Negative for wound.     Physical Exam Triage Vital Signs ED  Triage Vitals  Enc Vitals Group     BP 05/07/20 1931 (!) 151/91     Pulse Rate 05/07/20 1931 76     Resp 05/07/20 1931 16     Temp 05/07/20 1931 98.6 F (37 C)     Temp Source 05/07/20 1931 Oral     SpO2 05/07/20 1931 100 %     Weight --      Height --      Head Circumference --      Peak Flow --      Pain Score 05/07/20 1929 2     Pain Loc --      Pain Edu? --      Excl. in [place]? --    No data found.  Updated Vital Signs BP (!) 151/91 (BP Location: Right Arm)   Pulse 76   Temp 98.6 F (37 C) (Oral)   Resp 16   LMP 12/04/2013   SpO2 100%   Visual Acuity Right Eye Distance:   Left Eye Distance:   Bilateral Distance:    Right Eye Near:   Left Eye Near:    Bilateral Near:     Physical Exam Vitals and nursing note reviewed.  Constitutional:      Appearance: Normal appearance. She is well-developed.  HENT:     Head: Normocephalic and atraumatic.  Cardiovascular:     Rate and Rhythm: Normal rate and regular rhythm.     Pulses:          Dorsalis pedis pulses are 2+ on the right side.  Pulmonary:     Effort: Pulmonary effort is normal.  Musculoskeletal:        General: Swelling and tenderness present. Normal range of motion.     Cervical back: Normal range of motion.     Right foot: Normal range of motion. Swelling (fourth toe- mild edema) and tenderness ( fourth and fifth toes) present.  Skin:    General: Skin is warm and dry.     Capillary Refill: Capillary refill takes less than 2 seconds.     Findings: No bruising or erythema.   Neurological:     Mental Status: She is alert and oriented to person, place, and time.     Sensory: No sensory deficit.  Psychiatric:        Behavior: Behavior normal.      UC Treatments / Results  Labs (all labs ordered are listed, but only abnormal results are displayed) Labs Reviewed - No data to display  EKG   Radiology DG Foot Complete Right  Result Date: 05/07/2020 CLINICAL DATA:  Tripped, fourth and fifth digit pain EXAM: RIGHT FOOT COMPLETE - 3+ VIEW COMPARISON:  None. FINDINGS: Frontal, oblique, and lateral views of the right foot are obtained. There is subtle lucency at the base of the fifth proximal phalanx which could reflect a nondisplaced fracture. This is only seen on the oblique projection. No other acute bony abnormalities. Joint spaces are well preserved. There is significant soft tissue swelling of the dorsal aspect of the forefoot. IMPRESSION: 1. Possible nondisplaced fracture through the base of the fifth proximal phalanx. 2. Dorsal soft tissue swelling of the forefoot. Electronically Signed   By: Randa Ngo M.D.   On: 05/07/2020 20:16    Procedures Procedures (including critical care time)  Medications Ordered in UC Medications - No data to display  Initial Impression / Assessment and Plan / UC Course  I have reviewed the triage vital signs and the nursing  notes.  Pertinent labs & imaging results that were available during my care of the patient were reviewed by me and considered in my medical decision making (see chart for details).     Discussed imaging with pt Toes splinted using buddy taping technique, post-op shoe applied Encouraged f/u in 2-3 weeks if not improving AVS provided  Final Clinical Impressions(s) / UC Diagnoses   Final diagnoses:  Toe pain, right  Closed nondisplaced fracture of proximal phalanx of lesser toe of right foot, initial encounter     Discharge Instructions      You may take 500mg  acetaminophen every 4-6 hours  or in combination with ibuprofen 400-600mg  every 6-8 hours as needed for pain and inflammation.  Follow up with family medicine in 2-3 weeks if not improving, sooner if worsening- especially if you notice any skin changes due to your history of diabetes.      ED Prescriptions    None     PDMP not reviewed this encounter.   Noe Gens, [place] 05/07/20 2039

## 2020-05-07 NOTE — ED Triage Notes (Signed)
Patient presents to Urgent Care with complaints of 4th and 5th toe pain since almost two weeks ago. Patient reports seh was hiking and tripped on the dog leash. pt states there is still pain and swelling at the base of her toes, is able to ambulate, no pain meds pta.

## 2020-05-10 ENCOUNTER — Ambulatory Visit: Payer: BC Managed Care – PPO | Admitting: Family Medicine

## 2020-05-24 ENCOUNTER — Encounter: Payer: Self-pay | Admitting: Family Medicine

## 2020-05-24 ENCOUNTER — Ambulatory Visit (INDEPENDENT_AMBULATORY_CARE_PROVIDER_SITE_OTHER): Payer: BC Managed Care – PPO | Admitting: Family Medicine

## 2020-05-24 VITALS — BP 119/64 | HR 90 | Ht 67.0 in | Wt 209.0 lb

## 2020-05-24 DIAGNOSIS — E118 Type 2 diabetes mellitus with unspecified complications: Secondary | ICD-10-CM | POA: Diagnosis not present

## 2020-05-24 DIAGNOSIS — E1142 Type 2 diabetes mellitus with diabetic polyneuropathy: Secondary | ICD-10-CM

## 2020-05-24 DIAGNOSIS — G8929 Other chronic pain: Secondary | ICD-10-CM

## 2020-05-24 DIAGNOSIS — M545 Low back pain, unspecified: Secondary | ICD-10-CM

## 2020-05-24 LAB — POCT GLYCOSYLATED HEMOGLOBIN (HGB A1C): Hemoglobin A1C: 7.6 % — AB (ref 4.0–5.6)

## 2020-05-24 NOTE — Assessment & Plan Note (Addendum)
Was on Lyrica recently switched to gabapentin.

## 2020-05-24 NOTE — Assessment & Plan Note (Addendum)
Currently on Victoza and Janumet.  We discussed options.  We can try increasing the Victoza as she is only on 0.6 we will try increasing to 1.2 and see if she is able to tolerate this well.  If not we discussed possibly restarting glipizide at a lower dose.  She did get in for her eye exam at [facility] with Dr. Domingo Cocking she switched back to him.  She had originally switch because of insurance.

## 2020-05-24 NOTE — Progress Notes (Signed)
Established Patient Office Visit  Subjective:  Patient ID: Nicole Mata, female    DOB: 03/25/1961  Age: 59 y.o. MRN: [id number]  CC:  Chief Complaint  Patient presents with  . Diabetes    HPI Nicole Mata presents for   Diabetes - no hypoglycemic events. No wounds or sores that are not healing well. No increased thirst or urination. Checking glucose at home. Taking medications as prescribed without any side effects.  She did stop her glipizide about 6 months ago because of hypoglycemia that landed her in the emergency department.  Follow-up diabetic peripheral neuropathy-let me know that the orthopedist recently changed her to gabapentin instead of Lyrica to help with some of the neuropathy in her neck.  She was also seen in the ED on June 4 for toe fracture.  She says it is healing but she is still having some tenderness but she is not wearing the postop shoe anymore.  Also been struggling with some low back pain.  It is also been painful to sleep on either hip at night.  She does have a mostly sedentary job at work she says she is been trying to stand a little bit every hour.  She says it will just feel most tight when she sits for a while.  She has tried physical therapy in the past and felt like it was not really that helpful.  Past Medical History:  Diagnosis Date  . Ankle fracture, right 04/12/13  . Diabetes mellitus without complication ([place])   . Diverticulosis   . Gastropathy    reactive  . GERD (gastroesophageal reflux disease)   . Helicobacter pylori gastritis   . Hiatal hernia   . IBS (irritable bowel syndrome)   . Ileitis   . PONV (postoperative nausea and vomiting)     Past Surgical History:  Procedure Laterality Date  . CHOLECYSTECTOMY  2001  . LAPAROSCOPY  11/19/2012   Procedure: LAPAROSCOPY DIAGNOSTIC;  Surgeon: Joyice Faster. Cornett, MD;  Location: [facility];  Service: General;  Laterality: N/A;  Diagnostic laparoscopy  . SHOULDER SURGERY  2002, 2003    bilateral for bone spurs and torn RC  . UMBILICAL HERNIA REPAIR  11/19/2012   Procedure: LAPAROSCOPIC UMBILICAL HERNIA;  Surgeon: Joyice Faster. Cornett, MD;  Location: [facility];  Service: General;  Laterality: N/A;  ventral hernia repair with mesh    Family History  Problem Relation Age of Onset  . Heart disease Other        family disease  . Heart attack Father 37       grandparents  . Hypertension Father   . Hyperlipidemia Father   . Diabetes Maternal Grandmother   . Diabetes Maternal Uncle   . Ovarian cancer Maternal Aunt        x 2  . Hypertension Mother   . Hyperlipidemia Mother   . Diabetes type II Mother   . Diabetes Brother   . Ovarian cancer Maternal Aunt   . Colon cancer Neg Hx     Social History   Socioeconomic History  . Marital status: Married    Spouse name: Not on file  . Number of children: 0  . Years of education: Not on file  . Highest education level: Not on file  Occupational History  . Occupation: IRA specialist  Tobacco Use  . Smoking status: Never Smoker  . Smokeless tobacco: Never Used  Vaping Use  . Vaping Use: Never used  Substance and Sexual Activity  .  Alcohol use: Yes    Comment: very rarely  . Drug use: No  . Sexual activity: Yes    Partners: Male    Comment: divorced, no kids, customer service, 2 caffeine drinks daily.  Other Topics Concern  . Not on file  Social History Narrative   1 can of soda a day.  No regular exercise.    Social Determinants of Health   Financial Resource Strain:   . Difficulty of Paying Living Expenses:   Food Insecurity:   . Worried About Charity fundraiser in the Last Year:   . Arboriculturist in the Last Year:   Transportation Needs:   . Film/video editor (Medical):   Marland Kitchen Lack of Transportation (Non-Medical):   Physical Activity:   . Days of Exercise per Week:   . Minutes of Exercise per Session:   Stress:   . Feeling of Stress :   Social Connections:   . Frequency of Communication with Friends  and Family:   . Frequency of Social Gatherings with Friends and Family:   . Attends Religious Services:   . Active Member of Clubs or Organizations:   . Attends Archivist Meetings:   Marland Kitchen Marital Status:   Intimate Partner Violence:   . Fear of Current or Ex-Partner:   . Emotionally Abused:   Marland Kitchen Physically Abused:   . Sexually Abused:     Outpatient Medications Prior to Visit  Medication Sig Dispense Refill  . allopurinol (ZYLOPRIM) 100 MG tablet Take 1 tablet by mouth daily.    Marland Kitchen azelastine (ASTELIN) 0.1 % nasal spray Place 2 sprays into both nostrils 2 (two) times daily. Use in each nostril as directed 30 mL 12  . CONTOUR TEST test strip FOR CHECKING BLOOD SUGAR ONCE DAILY 100 strip 12  . DEXILANT 60 MG capsule Take 1 capsule (60 mg total) by mouth daily before breakfast. 30 capsule 10  . fluticasone (FLONASE) 50 MCG/ACT nasal spray Place 2 sprays into the nose daily. 16 g 2  . gabapentin (NEURONTIN) 100 MG capsule Take 100 mg by mouth at bedtime.    . Insulin Pen Needle (B-D ULTRAFINE III SHORT PEN) 31G X 8 MM MISC INJECT INTO THE SKIN ONCE DAILY AS DIRECTED 100 each PRN  . JANUMET XR [phone] MG TB24 TAKE 1 TABLET BY MOUTH DAILY 90 tablet 1  . ketotifen (ZADITOR) 0.025 % ophthalmic solution Place 1 drop into both eyes every morning.    . Lancets (FREESTYLE) lancets For testing blood sugars up to 2 times daily. DX: E11.9. Please make sure had the dispenser/cartridge. Thank you 100 each 12  . liraglutide (VICTOZA) 18 MG/3ML SOPN Inject 0.1 mLs (0.6 mg total) into the skin daily. OK to increase to 1.2 mg after 1 week and then 1.8mg  after an additional week. 12 mL 3  . ondansetron (ZOFRAN-ODT) 4 MG disintegrating tablet Take 1 tablet (4 mg total) by mouth every 8 (eight) hours as needed for nausea or vomiting. 15 tablet 0  . gabapentin (NEURONTIN) 100 MG capsule Take 100 mg by mouth at bedtime.    Marland Kitchen glipiZIDE (GLUCOTROL) 10 MG tablet Take 10 mg by mouth 2 (two) times daily before a  meal.    . pregabalin (LYRICA) 25 MG capsule Take 1 capsule (25 mg total) by mouth 2 (two) times daily. 180 capsule 3  . doxycycline (VIBRA-TABS) 100 MG tablet Take 1 tablet (100 mg total) by mouth 2 (two) times daily. 20 tablet 0  No facility-administered medications prior to visit.    Allergies  Allergen Reactions  . Penicillins Anaphylaxis  . Sulfa Drugs Cross Reactors Anaphylaxis  . Contrast Media [Iodinated Diagnostic Agents] Hives  . Atorvastatin Other (See Comments)    Muscle aches  . Gabapentin Other (See Comments)    GERD  . Januvia [Sitagliptin] Other (See Comments)    Hot flashes and flushed and dizzy  . Morphine And Related Nausea And Vomiting    Pt chooses not to take it  . Prednisone     Other reaction(s): Confusion  . Azithromycin Other (See Comments)    Other reaction(s): Other Makes her feel really crazy. Makes her feel really crazy.  Marland Kitchen Hydrocodone Nausea And Vomiting  . Invokana [Canagliflozin] Rash    Rash starting on her face    ROS Review of Systems    Objective:    Physical Exam Constitutional:      Appearance: She is well-developed.  HENT:     Head: Normocephalic and atraumatic.  Cardiovascular:     Rate and Rhythm: Normal rate and regular rhythm.     Heart sounds: Normal heart sounds.  Pulmonary:     Effort: Pulmonary effort is normal.     Breath sounds: Normal breath sounds.  Skin:    General: Skin is warm and dry.  Neurological:     Mental Status: She is alert and oriented to person, place, and time.  Psychiatric:        Behavior: Behavior normal.     BP 119/64   Pulse 90   Ht 5\' 7"  (1.702 m)   Wt 209 lb (94.8 kg)   LMP 12/04/2013   SpO2 97%   BMI 32.73 kg/m  Wt Readings from Last 3 Encounters:  05/24/20 209 lb (94.8 kg)  02/04/20 203 lb (92.1 kg)  02/02/20 203 lb (92.1 kg)     Health Maintenance Due  Topic Date Due  . COVID-19 Vaccine (1) Never done  . OPHTHALMOLOGY EXAM  03/11/2020    There are no preventive  care reminders to display for this patient.  Lab Results  Component Value Date   TSH 1.316 10/13/2013   Lab Results  Component Value Date   WBC 8.7 02/04/2020   HGB 12.5 02/04/2020   HCT 37.2 02/04/2020   MCV 89.6 02/04/2020   PLT 308 02/04/2020   Lab Results  Component Value Date   NA 140 02/02/2020   K 4.3 02/02/2020   CO2 31 02/02/2020   GLUCOSE 127 (H) 02/02/2020   BUN 12 02/02/2020   CREATININE 0.66 02/02/2020   BILITOT 0.4 07/14/2019   ALKPHOS 74 07/18/2017   AST 16 07/14/2019   ALT 10 07/14/2019   PROT 7.1 07/14/2019   ALBUMIN 3.9 07/18/2017   CALCIUM 9.8 02/02/2020   Lab Results  Component Value Date   CHOL 156 07/14/2019   Lab Results  Component Value Date   HDL 43 (L) 07/14/2019   Lab Results  Component Value Date   LDLCALC 89 07/14/2019   Lab Results  Component Value Date   TRIG 147 07/14/2019   Lab Results  Component Value Date   CHOLHDL 3.6 07/14/2019   Lab Results  Component Value Date   HGBA1C 7.6 (A) 05/24/2020      Assessment & Plan:   Problem List Items Addressed This Visit      Endocrine   Diabetic peripheral neuropathy ([place])    Was on Lyrica recently switched to gabapentin.  Relevant Medications   gabapentin (NEURONTIN) 100 MG capsule   Controlled diabetes mellitus type 2 with complications ([place]) - Primary    Currently on Victoza and Janumet.  We discussed options.  We can try increasing the Victoza as she is only on 0.6 we will try increasing to 1.2 and see if she is able to tolerate this well.  If not we discussed possibly restarting glipizide at a lower dose.  She did get in for her eye exam at [facility] with Dr. Domingo Cocking she switched back to him.  She had originally switch because of insurance.      Relevant Orders   POCT glycosylated hemoglobin (Hb A1C) (Completed)     Other   Low back pain     Low back pain-we discussed possible referral back to sports med and/or Ortho she might be a candidate  for injections and she is already tried physical therapy.  We discussed using her sit/stand desk at work and trying to avoid prolonged sitting.  Also discussed starting a walking program she says in fact she actually had started walking recently for exercise.  No orders of the defined types were placed in this encounter.   Follow-up: Return in about 3 months (around 08/24/2020) for Diabetes follow-up.    Beatrice Lecher, MD

## 2020-06-18 ENCOUNTER — Ambulatory Visit: Payer: BC Managed Care – PPO | Admitting: Podiatry

## 2020-06-28 ENCOUNTER — Ambulatory Visit: Payer: BC Managed Care – PPO | Admitting: Sports Medicine

## 2020-07-27 ENCOUNTER — Telehealth (INDEPENDENT_AMBULATORY_CARE_PROVIDER_SITE_OTHER): Payer: BC Managed Care – PPO | Admitting: Nurse Practitioner

## 2020-07-27 ENCOUNTER — Encounter: Payer: Self-pay | Admitting: Nurse Practitioner

## 2020-07-27 DIAGNOSIS — J0101 Acute recurrent maxillary sinusitis: Secondary | ICD-10-CM

## 2020-07-27 MED ORDER — DOXYCYCLINE HYCLATE 100 MG PO TABS: 100.0000 mg | ORAL_TABLET | Freq: Two times a day (BID) | ORAL | 0 refills | Status: DC

## 2020-07-27 NOTE — Progress Notes (Signed)
Virtual Video Visit via MyChart Note  I connected with  Nicole Mata on 07/27/20 at  2:10 PM EDT by the video enabled telemedicine application for , MyChart, and verified that I am speaking with the correct person using two identifiers.   I introduced myself as a Designer, jewellery with the practice. We discussed the limitations of evaluation and management by telemedicine and the availability of in person appointments. The patient expressed understanding and agreed to proceed.  The patient is: at work I am: at home  Subjective:    CC:  Chief Complaint  Patient presents with  . Sinusitis    HPI: Nicole Mata is a 59 y.o. y/o female presenting via [place] today for sinus pain and pressure, postnasal drip, sore throat, ear pain and pressure, headache that have been going on for the past week.  She reports she was out of town and stayed in an area that she feels may have had the presence of mold which exacerbated her symptoms.  Since returning her symptoms have now improved.  She did begin to take prescription of doxycycline that she had at home and feels that this has been helpful for her symptoms.  This is what she typically uses for sinusitis.  She does not have enough of the doxycycline for a full cycle.  And is requesting more.  She has been using Tylenol cold and sinus which has been helpful somewhat for the relief of her symptoms.  She denies fever, chills, body aches, cough.  Past medical history, Surgical history, Family history not pertinant except as noted below, Social history, Allergies, and medications have been entered into the medical record, reviewed, and corrections made.   Review of Systems:  See HPI for pertinent positive and negatives  Objective:    General: Speaking clearly in complete sentences without any shortness of breath.   Alert and oriented x3.   Normal judgment.  No apparent acute distress.   Impression and Recommendations:    1. Acute recurrent  maxillary sinusitis Symptoms and presentation consistent with acute recurrent maxillary sinusitis.  Will continue the treatment with doxycycline 100 mg twice a day for the next 5 days.  Discussed with the patient that if symptoms persist beyond the 5 days she may continue the doses for up to a maximum of 10 days. She may continue to use the Tylenol Cold and sinus for symptom relief as needed. Patient instructed to follow-up if symptoms persist or worsen with treatment. - doxycycline (VIBRA-TABS) 100 MG tablet; Take 1 tablet (100 mg total) by mouth 2 (two) times daily.  Dispense: 20 tablet; Refill: 0  I discussed the assessment and treatment plan with the patient. The patient was provided an opportunity to ask questions and all were answered. The patient agreed with the plan and demonstrated an understanding of the instructions.   The patient was advised to call back or seek an in-person evaluation if the symptoms worsen or if the condition fails to improve as anticipated.  I provided 20 minutes of non-face-to-face interaction with this [place] visit including intake, same-day documentation, and chart review.   Orma Render, NP

## 2020-08-23 ENCOUNTER — Ambulatory Visit (INDEPENDENT_AMBULATORY_CARE_PROVIDER_SITE_OTHER): Payer: BC Managed Care – PPO | Admitting: Family Medicine

## 2020-08-23 ENCOUNTER — Other Ambulatory Visit: Payer: Self-pay

## 2020-08-23 ENCOUNTER — Encounter: Payer: Self-pay | Admitting: Family Medicine

## 2020-08-23 VITALS — BP 115/61 | HR 81 | Ht 67.0 in | Wt 209.0 lb

## 2020-08-23 DIAGNOSIS — E118 Type 2 diabetes mellitus with unspecified complications: Secondary | ICD-10-CM | POA: Diagnosis not present

## 2020-08-23 LAB — POCT GLYCOSYLATED HEMOGLOBIN (HGB A1C): Hemoglobin A1C: 8.1 % — AB (ref 4.0–5.6)

## 2020-08-23 MED ORDER — JANUMET XR 100-1000 MG PO TB24: 1.0000 | ORAL_TABLET | Freq: Every day | ORAL | 1 refills | Status: DC

## 2020-08-23 MED ORDER — VICTOZA 18 MG/3ML ~~LOC~~ SOPN: 1.8000 mg | PEN_INJECTOR | Freq: Every day | SUBCUTANEOUS | 0 refills | Status: DC

## 2020-08-23 NOTE — Progress Notes (Signed)
Established Patient Office Visit  Subjective:  Patient ID: Nicole Mata, female    DOB: [birth year]  Age: 59 y.o. MRN: [id number]  CC:  Chief Complaint  Patient presents with  . Diabetes    HPI Nicole Mata presents for follow-up diabetes-she reports her home blood sugars have been running a little bit higher she knows she has not been doing the best job with dietary intake but she has been under a lot of stress recently her bank merged with another bank and her job position has completely changed so she has been doing a lot of traveling which means eating out.  She also helps take care of her 3 of her grandchildren over the summer.  She recently started walking with a couple of neighbors    Past Medical History:  Diagnosis Date  . Ankle fracture, right 04/12/13  . Diabetes mellitus without complication ([place])   . Diverticulosis   . Gastropathy    reactive  . GERD (gastroesophageal reflux disease)   . Helicobacter pylori gastritis   . Hiatal hernia   . IBS (irritable bowel syndrome)   . Ileitis   . PONV (postoperative nausea and vomiting)     Past Surgical History:  Procedure Laterality Date  . CHOLECYSTECTOMY  2001  . LAPAROSCOPY  11/19/2012   Procedure: LAPAROSCOPY DIAGNOSTIC;  Surgeon: Joyice Faster. Cornett, MD;  Location: [place];  Service: General;  Laterality: N/A;  Diagnostic laparoscopy  . SHOULDER SURGERY  2002, 2003   bilateral for bone spurs and torn RC  . UMBILICAL HERNIA REPAIR  11/19/2012   Procedure: LAPAROSCOPIC UMBILICAL HERNIA;  Surgeon: Joyice Faster. Cornett, MD;  Location: [facility];  Service: General;  Laterality: N/A;  ventral hernia repair with mesh    Family History  Problem Relation Age of Onset  . Heart disease Other        family disease  . Heart attack Father 54       grandparents  . Hypertension Father   . Hyperlipidemia Father   . Diabetes Maternal Grandmother   . Diabetes Maternal Uncle   . Ovarian cancer Maternal Aunt        x 2  .  Hypertension Mother   . Hyperlipidemia Mother   . Diabetes type II Mother   . Diabetes Brother   . Ovarian cancer Maternal Aunt   . Colon cancer Neg Hx     Social History   Socioeconomic History  . Marital status: Married    Spouse name: Not on file  . Number of children: 0  . Years of education: Not on file  . Highest education level: Not on file  Occupational History  . Occupation: IRA specialist  Tobacco Use  . Smoking status: Never Smoker  . Smokeless tobacco: Never Used  Vaping Use  . Vaping Use: Never used  Substance and Sexual Activity  . Alcohol use: Yes    Comment: very rarely  . Drug use: No  . Sexual activity: Yes    Partners: Male    Comment: divorced, no kids, customer service, 2 caffeine drinks daily.  Other Topics Concern  . Not on file  Social History Narrative   1 can of soda a day.  No regular exercise.    Social Determinants of Health   Financial Resource Strain:   . Difficulty of Paying Living Expenses: Not on file  Food Insecurity:   . Worried About Charity fundraiser in the Last Year: Not on file  .  Ran Out of Food in the Last Year: Not on file  Transportation Needs:   . Lack of Transportation (Medical): Not on file  . Lack of Transportation (Non-Medical): Not on file  Physical Activity:   . Days of Exercise per Week: Not on file  . Minutes of Exercise per Session: Not on file  Stress:   . Feeling of Stress : Not on file  Social Connections:   . Frequency of Communication with Friends and Family: Not on file  . Frequency of Social Gatherings with Friends and Family: Not on file  . Attends Religious Services: Not on file  . Active Member of Clubs or Organizations: Not on file  . Attends Archivist Meetings: Not on file  . Marital Status: Not on file  Intimate Partner Violence:   . Fear of Current or Ex-Partner: Not on file  . Emotionally Abused: Not on file  . Physically Abused: Not on file  . Sexually Abused: Not on file     Outpatient Medications Prior to Visit  Medication Sig Dispense Refill  . allopurinol (ZYLOPRIM) 100 MG tablet Take 1 tablet by mouth daily.    Marland Kitchen azelastine (ASTELIN) 0.1 % nasal spray Place 2 sprays into both nostrils 2 (two) times daily. Use in each nostril as directed 30 mL 12  . CONTOUR TEST test strip FOR CHECKING BLOOD SUGAR ONCE DAILY 100 strip 12  . DEXILANT 60 MG capsule Take 1 capsule (60 mg total) by mouth daily before breakfast. 30 capsule 10  . fluticasone (FLONASE) 50 MCG/ACT nasal spray Place 2 sprays into the nose daily. 16 g 2  . gabapentin (NEURONTIN) 100 MG capsule Take 100 mg by mouth at bedtime.    . Insulin Pen Needle (B-D ULTRAFINE III SHORT PEN) 31G X 8 MM MISC INJECT INTO THE SKIN ONCE DAILY AS DIRECTED 100 each PRN  . ketotifen (ZADITOR) 0.025 % ophthalmic solution Place 1 drop into both eyes every morning.    . Lancets (FREESTYLE) lancets For testing blood sugars up to 2 times daily. DX: E11.9. Please make sure had the dispenser/cartridge. Thank you 100 each 12  . Misc Natural Products (APPLE CIDER VINEGAR DIET) TABS Take 1 tablet by mouth daily. Goli ACV    . JANUMET XR [phone] MG TB24 TAKE 1 TABLET BY MOUTH DAILY 90 tablet 1  . liraglutide (VICTOZA) 18 MG/3ML SOPN Inject 0.1 mLs (0.6 mg total) into the skin daily. OK to increase to 1.2 mg after 1 week and then 1.8mg  after an additional week. 12 mL 3  . doxycycline (VIBRA-TABS) 100 MG tablet Take 1 tablet (100 mg total) by mouth 2 (two) times daily. 20 tablet 0  . ondansetron (ZOFRAN-ODT) 4 MG disintegrating tablet Take 1 tablet (4 mg total) by mouth every 8 (eight) hours as needed for nausea or vomiting. 15 tablet 0   No facility-administered medications prior to visit.    Allergies  Allergen Reactions  . Penicillins Anaphylaxis  . Sulfa Drugs Cross Reactors Anaphylaxis  . Contrast Media [Iodinated Diagnostic Agents] Hives  . Atorvastatin Other (See Comments)    Muscle aches  . Gabapentin Other (See  Comments)    GERD  . Januvia [Sitagliptin] Other (See Comments)    Hot flashes and flushed and dizzy  . Morphine And Related Nausea And Vomiting    Pt chooses not to take it  . Prednisone     Other reaction(s): Confusion  . Azithromycin Other (See Comments)    Other reaction(s): Other  Makes her feel really crazy. Makes her feel really crazy.  Marland Kitchen Hydrocodone Nausea And Vomiting  . Invokana [Canagliflozin] Rash    Rash starting on her face    ROS Review of Systems    Objective:    Physical Exam Constitutional:      Appearance: She is well-developed.  HENT:     Head: Normocephalic and atraumatic.  Cardiovascular:     Rate and Rhythm: Normal rate and regular rhythm.     Heart sounds: Normal heart sounds.  Pulmonary:     Effort: Pulmonary effort is normal.     Breath sounds: Normal breath sounds.  Skin:    General: Skin is warm and dry.  Neurological:     Mental Status: She is alert and oriented to person, place, and time.  Psychiatric:        Behavior: Behavior normal.     BP 115/61   Pulse 81   Ht 5\' 7"  (1.702 m)   Wt 209 lb (94.8 kg)   LMP 12/04/2013   SpO2 98%   BMI 32.73 kg/m  Wt Readings from Last 3 Encounters:  08/23/20 209 lb (94.8 kg)  05/24/20 209 lb (94.8 kg)  02/04/20 203 lb (92.1 kg)     Health Maintenance Due  Topic Date Due  . COVID-19 Vaccine (1) Never done  . OPHTHALMOLOGY EXAM  03/11/2020  . INFLUENZA VACCINE  07/04/2020    There are no preventive care reminders to display for this patient.  Lab Results  Component Value Date   TSH 1.316 10/13/2013   Lab Results  Component Value Date   WBC 8.7 02/04/2020   HGB 12.5 02/04/2020   HCT 37.2 02/04/2020   MCV 89.6 02/04/2020   PLT 308 02/04/2020   Lab Results  Component Value Date   NA 140 02/02/2020   K 4.3 02/02/2020   CO2 31 02/02/2020   GLUCOSE 127 (H) 02/02/2020   BUN 12 02/02/2020   CREATININE 0.66 02/02/2020   BILITOT 0.4 07/14/2019   ALKPHOS 74 07/18/2017   AST 16  07/14/2019   ALT 10 07/14/2019   PROT 7.1 07/14/2019   ALBUMIN 3.9 07/18/2017   CALCIUM 9.8 02/02/2020   Lab Results  Component Value Date   CHOL 156 07/14/2019   Lab Results  Component Value Date   HDL 43 (L) 07/14/2019   Lab Results  Component Value Date   LDLCALC 89 07/14/2019   Lab Results  Component Value Date   TRIG 147 07/14/2019   Lab Results  Component Value Date   CHOLHDL 3.6 07/14/2019   Lab Results  Component Value Date   HGBA1C 8.1 (A) 08/23/2020      Assessment & Plan:   Problem List Items Addressed This Visit      Endocrine   Controlled diabetes mellitus type 2 with complications ([place]) - Primary    Uncontrolled.  Discussed options including adjusting her medication regimen today.  At some point she can also try going back up on the Victoza to 1.8 mg even though initially she did not tolerate it.  We discussed getting back on track with reducing sugary beverages in the diet and try to make more healthy choices.  And skipping things like carbs if having to order through drive-through/fast food places.  I am glad she has started a walking routine with some neighbors that is fantastic.  She wants to work on diet and exercise for the next 3 months and then follow-up at that point in time  without making any adjustments to her medication regimen today.  Continue current regimen. Follow up in  3 months.       Relevant Medications   SitaGLIPtin-MetFORMIN HCl (JANUMET XR) [phone] MG TB24   liraglutide (VICTOZA) 18 MG/3ML SOPN   Other Relevant Orders   POCT glycosylated hemoglobin (Hb A1C) (Completed)      Meds ordered this encounter  Medications  . SitaGLIPtin-MetFORMIN HCl (JANUMET XR) [phone] MG TB24    Sig: Take 1 tablet by mouth daily.    Dispense:  90 tablet    Refill:  1  . liraglutide (VICTOZA) 18 MG/3ML SOPN    Sig: Inject 1.8 mg into the skin daily.    Dispense:  12 mL    Refill:  0    Follow-up: Return in about 3 months (around 11/22/2020)  for Diabetes .    Beatrice Lecher, MD

## 2020-08-23 NOTE — Assessment & Plan Note (Signed)
Uncontrolled.  Discussed options including adjusting her medication regimen today.  At some point she can also try going back up on the Victoza to 1.8 mg even though initially she did not tolerate it.  We discussed getting back on track with reducing sugary beverages in the diet and try to make more healthy choices.  And skipping things like carbs if having to order through drive-through/fast food places.  I am glad she has started a walking routine with some neighbors that is fantastic.  She wants to work on diet and exercise for the next 3 months and then follow-up at that point in time without making any adjustments to her medication regimen today.  Continue current regimen. Follow up in  3 months.

## 2020-09-17 ENCOUNTER — Other Ambulatory Visit: Payer: Self-pay | Admitting: Family Medicine

## 2020-09-17 NOTE — Telephone Encounter (Signed)
Called to pharmacy 

## 2020-09-18 DIAGNOSIS — K5730 Diverticulosis of large intestine without perforation or abscess without bleeding: Secondary | ICD-10-CM | POA: Diagnosis not present

## 2020-09-18 DIAGNOSIS — Z888 Allergy status to other drugs, medicaments and biological substances status: Secondary | ICD-10-CM | POA: Diagnosis not present

## 2020-09-18 DIAGNOSIS — E119 Type 2 diabetes mellitus without complications: Secondary | ICD-10-CM | POA: Diagnosis not present

## 2020-09-18 DIAGNOSIS — Z79899 Other long term (current) drug therapy: Secondary | ICD-10-CM | POA: Diagnosis not present

## 2020-09-18 DIAGNOSIS — R1084 Generalized abdominal pain: Secondary | ICD-10-CM | POA: Diagnosis not present

## 2020-09-18 DIAGNOSIS — Z91041 Radiographic dye allergy status: Secondary | ICD-10-CM | POA: Diagnosis not present

## 2020-09-18 DIAGNOSIS — R9431 Abnormal electrocardiogram [ECG] [EKG]: Secondary | ICD-10-CM | POA: Diagnosis not present

## 2020-09-18 DIAGNOSIS — Z885 Allergy status to narcotic agent status: Secondary | ICD-10-CM | POA: Diagnosis not present

## 2020-09-18 DIAGNOSIS — Z882 Allergy status to sulfonamides status: Secondary | ICD-10-CM | POA: Diagnosis not present

## 2020-09-18 DIAGNOSIS — Z880 Allergy status to penicillin: Secondary | ICD-10-CM | POA: Diagnosis not present

## 2020-09-18 DIAGNOSIS — N281 Cyst of kidney, acquired: Secondary | ICD-10-CM | POA: Diagnosis not present

## 2020-09-18 DIAGNOSIS — R197 Diarrhea, unspecified: Secondary | ICD-10-CM | POA: Diagnosis not present

## 2020-09-18 DIAGNOSIS — Z881 Allergy status to other antibiotic agents status: Secondary | ICD-10-CM | POA: Diagnosis not present

## 2020-09-18 DIAGNOSIS — N200 Calculus of kidney: Secondary | ICD-10-CM | POA: Diagnosis not present

## 2020-09-18 DIAGNOSIS — Z7951 Long term (current) use of inhaled steroids: Secondary | ICD-10-CM | POA: Diagnosis not present

## 2020-09-18 DIAGNOSIS — Z7984 Long term (current) use of oral hypoglycemic drugs: Secondary | ICD-10-CM | POA: Diagnosis not present

## 2020-09-19 DIAGNOSIS — R9431 Abnormal electrocardiogram [ECG] [EKG]: Secondary | ICD-10-CM | POA: Diagnosis not present

## 2020-09-23 ENCOUNTER — Telehealth: Payer: Self-pay

## 2020-09-23 NOTE — Telephone Encounter (Signed)
PA for Dexilant was approved.

## 2020-10-01 DIAGNOSIS — R52 Pain, unspecified: Secondary | ICD-10-CM | POA: Diagnosis not present

## 2020-10-01 DIAGNOSIS — M7061 Trochanteric bursitis, right hip: Secondary | ICD-10-CM | POA: Diagnosis not present

## 2020-10-01 DIAGNOSIS — M7062 Trochanteric bursitis, left hip: Secondary | ICD-10-CM | POA: Diagnosis not present

## 2020-10-14 ENCOUNTER — Encounter: Payer: Self-pay | Admitting: Family Medicine

## 2020-10-14 ENCOUNTER — Telehealth (INDEPENDENT_AMBULATORY_CARE_PROVIDER_SITE_OTHER): Payer: BC Managed Care – PPO | Admitting: Family Medicine

## 2020-10-14 VITALS — BP 130/87 | Temp 98.2°F

## 2020-10-14 DIAGNOSIS — E118 Type 2 diabetes mellitus with unspecified complications: Secondary | ICD-10-CM | POA: Diagnosis not present

## 2020-10-14 DIAGNOSIS — J019 Acute sinusitis, unspecified: Secondary | ICD-10-CM

## 2020-10-14 MED ORDER — GLIPIZIDE 10 MG PO TABS: 10.0000 mg | ORAL_TABLET | Freq: Two times a day (BID) | ORAL | 0 refills | Status: DC

## 2020-10-14 NOTE — Progress Notes (Signed)
EAR pressure x 3 days Sinus issues x 1w Taking Flonase No f/s/c  Pt also stated that she is still having issues with victoza

## 2020-10-14 NOTE — Assessment & Plan Note (Signed)
Gust options including just going back down on her dose of the Victoza to either 1.2 or even 0.6 mg if needed.  She said she would rather just use the glipizide at least for a while to see if she can just start to feel little bit better then maybe consider reintroducing Victoza think is perfectly reasonable did warn her about potential side effects including weight gain.  Glipizide sent to pharmacy.  Keep regularly scheduled follow-up in a couple months for her diabetes.

## 2020-10-14 NOTE — Progress Notes (Signed)
Virtual Visit via Video Note  I connected with Nicole Mata on 10/14/20 at  4:00 PM EST by a video enabled telemedicine application and verified that I am speaking with the correct person using two identifiers.   I discussed the limitations of evaluation and management by telemedicine and the availability of in person appointments. The patient expressed understanding and agreed to proceed.  Patient location: at home Provider location: in office    Established Patient Office Visit  Subjective:  Patient ID: Nicole Mata, female    DOB: [birth year]  Age: 59 y.o. MRN: [id number]  CC: No chief complaint on file.   HPI Nicole Mata presents for   Sinus issues.  She says for the last week she has had some sinus congestion on and off a little bit of runny nose for couple days some pressure in her right ear in particular.  No fevers chills or sweats.  She has had some pressure in the maxillary sinus area she has been using some Tylenol Sinus here there and it does help some.  She actually picked up some Flonase this morning and started it and said it actually feels a little bit better this afternoon.  No known exposures to Covid.  At that she has been traveling for work more recently.  Also need to fill me in about a recent episode of nausea, vomiting and diarrhea that occurred she had eaten at [employer] and thinks she may have actually eaten something bad she ended up going to the emergency department after couple days of symptoms she did receive some IV fluids and they did do a cardiac work-up which was negative.  She did get a GI cocktail there which was helpful she is actually feeling much better now.  Diabetes-she feels the Victoza still keeping her stomach upset.  She says after a few days of taking it consistently she will start to feel nauseated.  She wants to know if she could just go back on her glipizide at least for short period of time.  Past Medical History:  Diagnosis Date  .  Ankle fracture, right 04/12/13  . Diabetes mellitus without complication ([place])   . Diverticulosis   . Gastropathy    reactive  . GERD (gastroesophageal reflux disease)   . Helicobacter pylori gastritis   . Hiatal hernia   . IBS (irritable bowel syndrome)   . Ileitis   . PONV (postoperative nausea and vomiting)     Past Surgical History:  Procedure Laterality Date  . CHOLECYSTECTOMY  2001  . LAPAROSCOPY  11/19/2012   Procedure: LAPAROSCOPY DIAGNOSTIC;  Surgeon: Joyice Faster. Cornett, MD;  Location: [facility];  Service: General;  Laterality: N/A;  Diagnostic laparoscopy  . SHOULDER SURGERY  2002, 2003   bilateral for bone spurs and torn RC  . UMBILICAL HERNIA REPAIR  11/19/2012   Procedure: LAPAROSCOPIC UMBILICAL HERNIA;  Surgeon: Joyice Faster. Cornett, MD;  Location: [facility];  Service: General;  Laterality: N/A;  ventral hernia repair with mesh    Family History  Problem Relation Age of Onset  . Heart disease Other        family disease  . Heart attack Father 67       grandparents  . Hypertension Father   . Hyperlipidemia Father   . Diabetes Maternal Grandmother   . Diabetes Maternal Uncle   . Ovarian cancer Maternal Aunt        x 2  . Hypertension Mother   . Hyperlipidemia Mother   .  Diabetes type II Mother   . Diabetes Brother   . Ovarian cancer Maternal Aunt   . Colon cancer Neg Hx     Social History   Socioeconomic History  . Marital status: Married    Spouse name: Not on file  . Number of children: 0  . Years of education: Not on file  . Highest education level: Not on file  Occupational History  . Occupation: IRA specialist  Tobacco Use  . Smoking status: Never Smoker  . Smokeless tobacco: Never Used  Vaping Use  . Vaping Use: Never used  Substance and Sexual Activity  . Alcohol use: Yes    Comment: very rarely  . Drug use: No  . Sexual activity: Yes    Partners: Male    Comment: divorced, no kids, customer service, 2 caffeine drinks daily.  Other Topics  Concern  . Not on file  Social History Narrative   1 can of soda a day.  No regular exercise.    Social Determinants of Health   Financial Resource Strain:   . Difficulty of Paying Living Expenses: Not on file  Food Insecurity:   . Worried About Charity fundraiser in the Last Year: Not on file  . Ran Out of Food in the Last Year: Not on file  Transportation Needs:   . Lack of Transportation (Medical): Not on file  . Lack of Transportation (Non-Medical): Not on file  Physical Activity:   . Days of Exercise per Week: Not on file  . Minutes of Exercise per Session: Not on file  Stress:   . Feeling of Stress : Not on file  Social Connections:   . Frequency of Communication with Friends and Family: Not on file  . Frequency of Social Gatherings with Friends and Family: Not on file  . Attends Religious Services: Not on file  . Active Member of Clubs or Organizations: Not on file  . Attends Archivist Meetings: Not on file  . Marital Status: Not on file  Intimate Partner Violence:   . Fear of Current or Ex-Partner: Not on file  . Emotionally Abused: Not on file  . Physically Abused: Not on file  . Sexually Abused: Not on file    Outpatient Medications Prior to Visit  Medication Sig Dispense Refill  . allopurinol (ZYLOPRIM) 100 MG tablet Take 1 tablet by mouth daily.    Marland Kitchen azelastine (ASTELIN) 0.1 % nasal spray Place 2 sprays into both nostrils 2 (two) times daily. Use in each nostril as directed 30 mL 12  . CONTOUR TEST test strip FOR CHECKING BLOOD SUGAR ONCE DAILY 100 strip 12  . DEXILANT 60 MG capsule Take 1 capsule (60 mg total) by mouth daily before breakfast. 30 capsule 10  . fluticasone (FLONASE) 50 MCG/ACT nasal spray Place 2 sprays into the nose daily. 16 g 2  . gabapentin (NEURONTIN) 100 MG capsule Take 100 mg by mouth at bedtime.    . Insulin Pen Needle (B-D ULTRAFINE III SHORT PEN) 31G X 8 MM MISC INJECT INTO THE SKIN ONCE DAILY AS DIRECTED 100 each PRN  .  ketotifen (ZADITOR) 0.025 % ophthalmic solution Place 1 drop into both eyes every morning.    . Lancets (FREESTYLE) lancets For testing blood sugars up to 2 times daily. DX: E11.9. Please make sure had the dispenser/cartridge. Thank you 100 each 12  . liraglutide (VICTOZA) 18 MG/3ML SOPN Inject 1.8 mg into the skin daily. 12 mL 0  . Misc  Natural Products (APPLE CIDER VINEGAR DIET) TABS Take 1 tablet by mouth daily. [place] ACV    . SitaGLIPtin-MetFORMIN HCl (JANUMET XR) [phone] MG TB24 Take 1 tablet by mouth daily. 90 tablet 1   No facility-administered medications prior to visit.    Allergies  Allergen Reactions  . Penicillins Anaphylaxis  . Sulfa Drugs Cross Reactors Anaphylaxis  . Contrast Media [Iodinated Diagnostic Agents] Hives  . Atorvastatin Other (See Comments)    Muscle aches  . Gabapentin Other (See Comments)    GERD  . Januvia [Sitagliptin] Other (See Comments)    Hot flashes and flushed and dizzy  . Morphine And Related Nausea And Vomiting    Pt chooses not to take it  . Prednisone     Other reaction(s): Confusion  . Azithromycin Other (See Comments)    Other reaction(s): Other Makes her feel really crazy. Makes her feel really crazy.  Marland Kitchen Hydrocodone Nausea And Vomiting  . Invokana [Canagliflozin] Rash    Rash starting on her face    ROS Review of Systems    Objective:    Physical Exam  BP 130/87   Temp 98.2 F (36.8 C)   LMP 12/04/2013   SpO2 95%  Wt Readings from Last 3 Encounters:  08/23/20 209 lb (94.8 kg)  05/24/20 209 lb (94.8 kg)  02/04/20 203 lb (92.1 kg)     Health Maintenance Due  Topic Date Due  . COVID-19 Vaccine (1) Never done  . OPHTHALMOLOGY EXAM  03/11/2020    There are no preventive care reminders to display for this patient.  Lab Results  Component Value Date   TSH 1.316 10/13/2013   Lab Results  Component Value Date   WBC 8.7 02/04/2020   HGB 12.5 02/04/2020   HCT 37.2 02/04/2020   MCV 89.6 02/04/2020   PLT 308  02/04/2020   Lab Results  Component Value Date   NA 140 02/02/2020   K 4.3 02/02/2020   CO2 31 02/02/2020   GLUCOSE 127 (H) 02/02/2020   BUN 12 02/02/2020   CREATININE 0.66 02/02/2020   BILITOT 0.4 07/14/2019   ALKPHOS 74 07/18/2017   AST 16 07/14/2019   ALT 10 07/14/2019   PROT 7.1 07/14/2019   ALBUMIN 3.9 07/18/2017   CALCIUM 9.8 02/02/2020   Lab Results  Component Value Date   CHOL 156 07/14/2019   Lab Results  Component Value Date   HDL 43 (L) 07/14/2019   Lab Results  Component Value Date   LDLCALC 89 07/14/2019   Lab Results  Component Value Date   TRIG 147 07/14/2019   Lab Results  Component Value Date   CHOLHDL 3.6 07/14/2019   Lab Results  Component Value Date   HGBA1C 8.1 (A) 08/23/2020      Assessment & Plan:   Problem List Items Addressed This Visit      Endocrine   Controlled diabetes mellitus type 2 with complications ([place]) - Primary    Gust options including just going back down on her dose of the Victoza to either 1.2 or even 0.6 mg if needed.  She said she would rather just use the glipizide at least for a while to see if she can just start to feel little bit better then maybe consider reintroducing Victoza think is perfectly reasonable did warn her about potential side effects including weight gain.  Glipizide sent to pharmacy.  Keep regularly scheduled follow-up in a couple months for her diabetes.      Relevant Medications  glipiZIDE (GLUCOTROL) 10 MG tablet    Other Visit Diagnoses    Acute non-recurrent sinusitis, unspecified location          Acute sinusitis-do think there is some allergic component to this she does feel little bit better after using Flonase today so encouraged her to try to use that through the weekend and see if she continues to improve if she feels worse tomorrow then please call me back if not better by Monday and will consider trial antibiotics at that point.  Meds ordered this encounter  Medications  .  glipiZIDE (GLUCOTROL) 10 MG tablet    Sig: Take 1 tablet (10 mg total) by mouth 2 (two) times daily before a meal.    Dispense:  180 tablet    Refill:  0    Follow-up: No follow-ups on file.     Time spent in encounter 20 minutes  I discussed the assessment and treatment plan with the patient. The patient was provided an opportunity to ask questions and all were answered. The patient agreed with the plan and demonstrated an understanding of the instructions.   The patient was advised to call back or seek an in-person evaluation if the symptoms worsen or if the condition fails to improve as anticipated.   Beatrice Lecher, MD

## 2020-11-22 ENCOUNTER — Ambulatory Visit (INDEPENDENT_AMBULATORY_CARE_PROVIDER_SITE_OTHER): Payer: BC Managed Care – PPO | Admitting: Family Medicine

## 2020-11-22 ENCOUNTER — Other Ambulatory Visit: Payer: Self-pay

## 2020-11-22 ENCOUNTER — Encounter: Payer: Self-pay | Admitting: Family Medicine

## 2020-11-22 VITALS — BP 124/64 | HR 88 | Ht 67.0 in | Wt 208.0 lb

## 2020-11-22 DIAGNOSIS — Z2821 Immunization not carried out because of patient refusal: Secondary | ICD-10-CM | POA: Diagnosis not present

## 2020-11-22 DIAGNOSIS — E118 Type 2 diabetes mellitus with unspecified complications: Secondary | ICD-10-CM

## 2020-11-22 DIAGNOSIS — E7800 Pure hypercholesterolemia, unspecified: Secondary | ICD-10-CM

## 2020-11-22 LAB — COMPLETE METABOLIC PANEL WITH GFR
AG Ratio: 1.4 (calc) (ref 1.0–2.5)
ALT: 16 U/L (ref 6–29)
AST: 19 U/L (ref 10–35)
Albumin: 4.2 g/dL (ref 3.6–5.1)
Alkaline phosphatase (APISO): 74 U/L (ref 37–153)
BUN: 13 mg/dL (ref 7–25)
CO2: 29 mmol/L (ref 20–32)
Calcium: 9 mg/dL (ref 8.6–10.4)
Chloride: 103 mmol/L (ref 98–110)
Creat: 0.74 mg/dL (ref 0.50–1.05)
GFR, Est African American: 104 mL/min/{1.73_m2} (ref >=60)
GFR, Est Non African American: 90 mL/min/{1.73_m2} (ref >=60)
Globulin: 3 g/dL (calc) (ref 1.9–3.7)
Glucose, Bld: 146 mg/dL — ABNORMAL HIGH (ref 65–99)
Potassium: 4 mmol/L (ref 3.5–5.3)
Sodium: 139 mmol/L (ref 135–146)
Total Bilirubin: 0.5 mg/dL (ref 0.2–1.2)
Total Protein: 7.2 g/dL (ref 6.1–8.1)

## 2020-11-22 LAB — LIPID PANEL W/REFLEX DIRECT LDL
Cholesterol: 169 mg/dL (ref <200)
HDL: 45 mg/dL — ABNORMAL LOW (ref >=50)
LDL Cholesterol (Calc): 101 mg/dL (calc) — ABNORMAL HIGH
Non-HDL Cholesterol (Calc): 124 mg/dL (calc) (ref <130)
Total CHOL/HDL Ratio: 3.8 (calc) (ref <5.0)
Triglycerides: 130 mg/dL (ref <150)

## 2020-11-22 LAB — POCT GLYCOSYLATED HEMOGLOBIN (HGB A1C): Hemoglobin A1C: 7.9 % — AB (ref 4.0–5.6)

## 2020-11-22 NOTE — Progress Notes (Signed)
Established Patient Office Visit  Subjective:  Patient ID: Nicole Mata, female    DOB: 1961/05/10  Age: 59 y.o. MRN: [id number]  CC:  Chief Complaint  Patient presents with  . Diabetes    HPI [name] presents for   Diabetes -try going up on her glipizide but had a hypoglycemic events. No wounds or sores that are not healing well. No increased thirst or urination. Checking glucose at home.  He restarted using her freestyle Elenor Legato about a month ago she says most of her fasting blood sugars in the morning have been between 112 and 140s.  She had a few readings up into the 200s.  She is trying to titrate what she is eating based on her blood sugar levels.  Taking medications as prescribed without any side effects.   Past Medical History:  Diagnosis Date  . Ankle fracture, right 04/12/13  . Diabetes mellitus without complication ([facility])   . Diverticulosis   . Gastropathy    reactive  . GERD (gastroesophageal reflux disease)   . Helicobacter pylori gastritis   . Hiatal hernia   . IBS (irritable bowel syndrome)   . Ileitis   . PONV (postoperative nausea and vomiting)     Past Surgical History:  Procedure Laterality Date  . CHOLECYSTECTOMY  2001  . LAPAROSCOPY  11/19/2012   Procedure: LAPAROSCOPY DIAGNOSTIC;  Surgeon: Joyice Faster. Cornett, MD;  Location: [facility];  Service: General;  Laterality: N/A;  Diagnostic laparoscopy  . SHOULDER SURGERY  2002, 2003   bilateral for bone spurs and torn RC  . UMBILICAL HERNIA REPAIR  11/19/2012   Procedure: LAPAROSCOPIC UMBILICAL HERNIA;  Surgeon: Joyice Faster. Cornett, MD;  Location: [facility];  Service: General;  Laterality: N/A;  ventral hernia repair with mesh    Family History  Problem Relation Age of Onset  . Heart disease Other        family disease  . Heart attack Father 34       grandparents  . Hypertension Father   . Hyperlipidemia Father   . Diabetes Maternal Grandmother   . Diabetes Maternal Uncle   . Ovarian cancer Maternal  Aunt        x 2  . Hypertension Mother   . Hyperlipidemia Mother   . Diabetes type II Mother   . Diabetes Brother   . Ovarian cancer Maternal Aunt   . Colon cancer Neg Hx     Social History   Socioeconomic History  . Marital status: Married    Spouse name: Not on file  . Number of children: 0  . Years of education: Not on file  . Highest education level: Not on file  Occupational History  . Occupation: IRA specialist  Tobacco Use  . Smoking status: Never Smoker  . Smokeless tobacco: Never Used  Vaping Use  . Vaping Use: Never used  Substance and Sexual Activity  . Alcohol use: Yes    Comment: very rarely  . Drug use: No  . Sexual activity: Yes    Partners: Male    Comment: divorced, no kids, customer service, 2 caffeine drinks daily.  Other Topics Concern  . Not on file  Social History Narrative   1 can of soda a day.  No regular exercise.    Social Determinants of Health   Financial Resource Strain: Not on file  Food Insecurity: Not on file  Transportation Needs: Not on file  Physical Activity: Not on file  Stress: Not on  file  Social Connections: Not on file  Intimate Partner Violence: Not on file    Outpatient Medications Prior to Visit  Medication Sig Dispense Refill  . allopurinol (ZYLOPRIM) 100 MG tablet Take 1 tablet by mouth daily.    Marland Kitchen azelastine (ASTELIN) 0.1 % nasal spray Place 2 sprays into both nostrils 2 (two) times daily. Use in each nostril as directed 30 mL 12  . CONTOUR TEST test strip FOR CHECKING BLOOD SUGAR ONCE DAILY 100 strip 12  . DEXILANT 60 MG capsule Take 1 capsule (60 mg total) by mouth daily before breakfast. 30 capsule 10  . fluticasone (FLONASE) 50 MCG/ACT nasal spray Place 2 sprays into the nose daily. 16 g 2  . gabapentin (NEURONTIN) 100 MG capsule Take 100 mg by mouth at bedtime.    Marland Kitchen glipiZIDE (GLUCOTROL) 10 MG tablet Take 1 tablet (10 mg total) by mouth 2 (two) times daily before a meal. 180 tablet 0  . Insulin Pen Needle  (B-D ULTRAFINE III SHORT PEN) 31G X 8 MM MISC INJECT INTO THE SKIN ONCE DAILY AS DIRECTED 100 each PRN  . ketotifen (ZADITOR) 0.025 % ophthalmic solution Place 1 drop into both eyes every morning.    . Lancets (FREESTYLE) lancets For testing blood sugars up to 2 times daily. DX: E11.9. Please make sure had the dispenser/cartridge. Thank you 100 each 12  . Misc Natural Products (APPLE CIDER VINEGAR DIET) TABS Take 1 tablet by mouth daily. [place] ACV    . SitaGLIPtin-MetFORMIN HCl (JANUMET XR) [phone] MG TB24 Take 1 tablet by mouth daily. 90 tablet 1  . liraglutide (VICTOZA) 18 MG/3ML SOPN Inject 1.8 mg into the skin daily. 12 mL 0   No facility-administered medications prior to visit.    Allergies  Allergen Reactions  . Penicillins Anaphylaxis  . Sulfa Drugs Cross Reactors Anaphylaxis  . Contrast Media [Iodinated Diagnostic Agents] Hives  . Atorvastatin Other (See Comments)    Muscle aches  . Gabapentin Other (See Comments)    GERD  . Januvia [Sitagliptin] Other (See Comments)    Hot flashes and flushed and dizzy  . Morphine And Related Nausea And Vomiting    Pt chooses not to take it  . Prednisone     Other reaction(s): Confusion  . Azithromycin Other (See Comments)    Other reaction(s): Other Makes her feel really crazy. Makes her feel really crazy.  Marland Kitchen Hydrocodone Nausea And Vomiting  . Invokana [Canagliflozin] Rash    Rash starting on her face    ROS Review of Systems    Objective:    Physical Exam Constitutional:      Appearance: She is well-developed and well-nourished.  HENT:     Head: Normocephalic and atraumatic.  Cardiovascular:     Rate and Rhythm: Normal rate and regular rhythm.     Heart sounds: Normal heart sounds.  Pulmonary:     Effort: Pulmonary effort is normal.     Breath sounds: Normal breath sounds.  Skin:    General: Skin is warm and dry.  Neurological:     Mental Status: She is alert and oriented to person, place, and time.  Psychiatric:         Mood and Affect: Mood and affect normal.        Behavior: Behavior normal.     BP 124/64   Pulse 88   Ht 5\' 7"  (1.702 m)   Wt 208 lb (94.3 kg)   LMP 12/04/2013   SpO2 97%  BMI 32.58 kg/m  Wt Readings from Last 3 Encounters:  11/22/20 208 lb (94.3 kg)  08/23/20 209 lb (94.8 kg)  05/24/20 209 lb (94.8 kg)     Health Maintenance Due  Topic Date Due  . COVID-19 Vaccine (1) Never done  . OPHTHALMOLOGY EXAM  03/11/2020    There are no preventive care reminders to display for this patient.  Lab Results  Component Value Date   TSH 1.316 10/13/2013   Lab Results  Component Value Date   WBC 8.7 02/04/2020   HGB 12.5 02/04/2020   HCT 37.2 02/04/2020   MCV 89.6 02/04/2020   PLT 308 02/04/2020   Lab Results  Component Value Date   NA 140 02/02/2020   K 4.3 02/02/2020   CO2 31 02/02/2020   GLUCOSE 127 (H) 02/02/2020   BUN 12 02/02/2020   CREATININE 0.66 02/02/2020   BILITOT 0.4 07/14/2019   ALKPHOS 74 07/18/2017   AST 16 07/14/2019   ALT 10 07/14/2019   PROT 7.1 07/14/2019   ALBUMIN 3.9 07/18/2017   CALCIUM 9.8 02/02/2020   Lab Results  Component Value Date   CHOL 156 07/14/2019   Lab Results  Component Value Date   HDL 43 (L) 07/14/2019   Lab Results  Component Value Date   LDLCALC 89 07/14/2019   Lab Results  Component Value Date   TRIG 147 07/14/2019   Lab Results  Component Value Date   CHOLHDL 3.6 07/14/2019   Lab Results  Component Value Date   HGBA1C 7.9 (A) 11/22/2020      Assessment & Plan:   Problem List Items Addressed This Visit      Endocrine   Controlled diabetes mellitus type 2 with complications ([facility]) - Primary    A1c improved slightly today down to 7.9 just charted using her freestyle libre about a month ago.  Continue to work at dietary change and increased activity level.  Follow-up in 3 months.      Relevant Orders   POCT glycosylated hemoglobin (Hb A1C) (Completed)   COMPLETE METABOLIC PANEL WITH GFR   Lipid  Panel w/reflex Direct LDL     Other   Hyperlipidemia    Due to check lipids.  Based on diagnosis of diabetes she would benefit from being on a statin.  He had muscle aches with atorvastatin but may be could consider a trial of a different statin.      Relevant Orders   COMPLETE METABOLIC PANEL WITH GFR   Lipid Panel w/reflex Direct LDL   COVID-19 vaccination declined      No orders of the defined types were placed in this encounter.   Follow-up: Return in about 3 months (around 02/20/2021) for Diabetes follow-up.    Beatrice Lecher, MD

## 2020-11-22 NOTE — Assessment & Plan Note (Signed)
Due to check lipids.  Based on diagnosis of diabetes she would benefit from being on a statin.  He had muscle aches with atorvastatin but may be could consider a trial of a different statin.

## 2020-11-22 NOTE — Assessment & Plan Note (Signed)
A1c improved slightly today down to 7.9 just charted using her freestyle libre about a month ago.  Continue to work at dietary change and increased activity level.  Follow-up in 3 months.

## 2020-11-25 MED ORDER — PRAVASTATIN SODIUM 20 MG PO TABS: 20.0000 mg | ORAL_TABLET | ORAL | 1 refills | Status: DC

## 2020-11-25 NOTE — Addendum Note (Signed)
Addended by: Beatrice Lecher D on: 11/25/2020 12:24 PM   Modules accepted: Orders

## 2020-12-01 ENCOUNTER — Encounter: Payer: Self-pay | Admitting: Physician Assistant

## 2020-12-01 ENCOUNTER — Telehealth (INDEPENDENT_AMBULATORY_CARE_PROVIDER_SITE_OTHER): Payer: BC Managed Care – PPO | Admitting: Physician Assistant

## 2020-12-01 VITALS — BP 126/78 | Temp 99.6°F | Ht 66.0 in | Wt 205.0 lb

## 2020-12-01 DIAGNOSIS — Z20822 Contact with and (suspected) exposure to covid-19: Secondary | ICD-10-CM

## 2020-12-01 DIAGNOSIS — J329 Chronic sinusitis, unspecified: Secondary | ICD-10-CM

## 2020-12-01 DIAGNOSIS — B9789 Other viral agents as the cause of diseases classified elsewhere: Secondary | ICD-10-CM

## 2020-12-01 NOTE — Progress Notes (Signed)
..Virtual Visit via Video Note  I connected with Nicole Mata on 12/01/20 at  9:50 AM EST by a video enabled telemedicine application and verified that I am speaking with the correct person using two identifiers.  Location: Patient: home Provider: clinic  .Marland KitchenParticipating in visit:  Patient: Nicole Mata Provider: Tandy Gaw PA-C   I discussed the limitations of evaluation and management by telemedicine and the availability of in person appointments. The patient expressed understanding and agreed to proceed.  History of Present Illness: Patient is a 59 year old female with history of recurrent sinus symptoms who calls into the clinic with sinus congestion, sinus pressure, ear popping and pressure, sinus drainage since yesterday that has worsened.  She does not have her Covid vaccine.  She did get her flu shot.  She has had a temperature around 99.2-99.5 today.  Her grandchildren were around her this weekend and had runny noses.  She denies any body aches, shortness of breath, cough, loss of smell or taste, GI symptoms.  There are no other sick contacts.  She has checked her oxygen saturation at home around 96%.  .. Active Ambulatory Problems    Diagnosis Date Noted   GERD 12/17/2008   Diaphragmatic hernia 12/17/2008   IRRITABLE BOWEL SYNDROME 12/17/2008   HELICOBACTER PYLORI GASTRITIS, HX OF 12/17/2008   Cervical radiculopathy at C7 01/16/2011   Obese 02/06/2012   Umbilical hernia 10/14/2012   Small bowel disease 10/14/2012   Menopausal symptom 06/04/2013   Controlled diabetes mellitus type 2 with complications ([facility]) 11/25/2013   Chondromalacia patellae 12/15/2014   History of kidney stones 12/15/2014   Hyperlipidemia 06/18/2015   Diabetic peripheral neuropathy ([facility]) 10/04/2016   Lumbar spondylosis 06/21/2017   Kidney stones 07/01/2019   Vitamin D deficiency 10/12/2009   Stiffness of joint, not elsewhere classified, other specified site 01/25/2010    Rosacea 08/23/2009   Pain in joints 10/07/2009   Other B-complex deficiencies 10/12/2009   Low back pain 04/22/2009   Generalized anxiety disorder 04/15/2010   Flushing 04/15/2010   Disorder of intestine 10/14/2012   Brachial neuritis 01/16/2011   Anxiety state 08/23/2009   Allergic rhinitis 01/25/2010   COVID-19 vaccination declined 11/22/2020   Resolved Ambulatory Problems    Diagnosis Date Noted   CHEST PAIN 05/24/2009   RUQ PAIN 12/21/2008   Acute maxillary sinusitis 09/14/2011   Acute bronchitis 09/14/2011   Insulin resistance 02/07/2012   Ileitis 06/07/2012   Diarrhea 06/07/2012   Ankle fracture, right 09/10/2013   Lateral epicondylitis of right elbow 12/15/2014   Urinary urgency 10/04/2016   Nausea 12/14/2017   Eustachian tube dysfunction, bilateral 02/01/2018   Past Medical History:  Diagnosis Date   Diabetes mellitus without complication ([facility])    Diverticulosis    Gastropathy    GERD (gastroesophageal reflux disease)    Helicobacter pylori gastritis    Hiatal hernia    IBS (irritable bowel syndrome)    PONV (postoperative nausea and vomiting)    Reviewed med, allergy, problem list.    Observations/Objective: No acute distress  Normal mood and appearance No labored breathing or cough  .Marland Kitchen Today's Vitals   12/01/20 0944  BP: 126/78  Temp: 99.6 F (37.6 C)  TempSrc: Oral  SpO2: 97%  Weight: 205 lb (93 kg)  Height: 5\' 6"  (1.676 m)   Body mass index is 33.09 kg/m.   Assessment and Plan: Marland KitchenVaunda was seen today for sinus congestion.  Diagnoses and all orders for this visit:  Suspected COVID-19 virus infection -  Novel Coronavirus, NAA ([facility])  Viral sinusitis   Symptoms started yesterday. Needs covid tested. Quarantine until test results are back. Start .Marland KitchenVitamin D3 5000 IU (125 mcg) daily Vitamin C 500 mg twice daily Zinc 50 to 75 mg daily Continue tylenol cold sinus severe and flonase.  Keep watching O2  stats. Rest and hydrate.  Follow up as needed or ED/UC with worsening breathing.    Follow Up Instructions:    I discussed the assessment and treatment plan with the patient. The patient was provided an opportunity to ask questions and all were answered. The patient agreed with the plan and demonstrated an understanding of the instructions.   The patient was advised to call back or seek an in-person evaluation if the symptoms worsen or if the condition fails to improve as anticipated.    Iran Planas, PA-C

## 2020-12-03 LAB — SARS-COV-2, NAA 2 DAY TAT

## 2020-12-03 LAB — NOVEL CORONAVIRUS, NAA

## 2020-12-05 NOTE — Progress Notes (Signed)
Nicole Mata,   How are you feeling? Your covid test was indeterminate but not negative.

## 2020-12-06 ENCOUNTER — Ambulatory Visit (INDEPENDENT_AMBULATORY_CARE_PROVIDER_SITE_OTHER): Payer: BC Managed Care – PPO | Admitting: Family Medicine

## 2020-12-06 ENCOUNTER — Telehealth: Payer: Self-pay | Admitting: Family Medicine

## 2020-12-06 DIAGNOSIS — Z20822 Contact with and (suspected) exposure to covid-19: Secondary | ICD-10-CM | POA: Diagnosis not present

## 2020-12-06 NOTE — Telephone Encounter (Signed)
Is she having sinus pressure? Fever? If viral can take a week or so for symptoms to clear. Are you taking mucinex DM or any type of OTc sinus medication. If trying things and been a week of symptoms ok to send antibiotic if not PCN allergic( please check) send augmentin 875/125mg  1 tablet bid #20 for 10 days.

## 2020-12-06 NOTE — Telephone Encounter (Signed)
Pt called. She is still having sinus drainage. Nicole Mata saw this patient on 12/29.  Thank you.

## 2020-12-07 DIAGNOSIS — Z20828 Contact with and (suspected) exposure to other viral communicable diseases: Secondary | ICD-10-CM | POA: Diagnosis not present

## 2020-12-07 DIAGNOSIS — J069 Acute upper respiratory infection, unspecified: Secondary | ICD-10-CM | POA: Diagnosis not present

## 2020-12-07 DIAGNOSIS — R509 Fever, unspecified: Secondary | ICD-10-CM | POA: Diagnosis not present

## 2020-12-07 NOTE — Progress Notes (Signed)
Agree with documentation as above.   Zeta Bucy, MD  

## 2020-12-08 MED ORDER — DOXYCYCLINE HYCLATE 100 MG PO TABS: 100.0000 mg | ORAL_TABLET | Freq: Two times a day (BID) | ORAL | 0 refills | Status: DC

## 2020-12-08 NOTE — Telephone Encounter (Signed)
Nicole Mata is allergic to PCN. She states Doxycycline helps her the best. [facility] [address] in [place]. Pended pharmacy.   She does have sinus pressure and fever. She has been taking a Tylenol Severe cold and sinus.

## 2020-12-08 NOTE — Telephone Encounter (Signed)
Ok I sent doxycycline to pharmacy.

## 2020-12-08 NOTE — Telephone Encounter (Signed)
Patient advised.

## 2020-12-09 ENCOUNTER — Encounter: Payer: Self-pay | Admitting: Family Medicine

## 2020-12-09 LAB — SPECIMEN STATUS REPORT

## 2020-12-09 LAB — NOVEL CORONAVIRUS, NAA: SARS-CoV-2, NAA: DETECTED — AB

## 2020-12-13 ENCOUNTER — Ambulatory Visit (INDEPENDENT_AMBULATORY_CARE_PROVIDER_SITE_OTHER): Payer: BC Managed Care – PPO

## 2020-12-13 ENCOUNTER — Other Ambulatory Visit: Payer: Self-pay

## 2020-12-13 ENCOUNTER — Encounter: Payer: Self-pay | Admitting: Family Medicine

## 2020-12-13 ENCOUNTER — Telehealth (INDEPENDENT_AMBULATORY_CARE_PROVIDER_SITE_OTHER): Payer: BC Managed Care – PPO | Admitting: Family Medicine

## 2020-12-13 ENCOUNTER — Telehealth: Payer: Self-pay | Admitting: *Deleted

## 2020-12-13 VITALS — BP 117/77

## 2020-12-13 DIAGNOSIS — R0602 Shortness of breath: Secondary | ICD-10-CM

## 2020-12-13 DIAGNOSIS — U071 COVID-19: Secondary | ICD-10-CM

## 2020-12-13 DIAGNOSIS — R059 Cough, unspecified: Secondary | ICD-10-CM | POA: Diagnosis not present

## 2020-12-13 NOTE — Progress Notes (Signed)
Virtual Visit via Video Note  I connected with Nicole Mata on 12/13/20 at  1:40 PM EST by a video enabled telemedicine application and verified that I am speaking with the correct person using two identifiers.   I discussed the limitations of evaluation and management by telemedicine and the availability of in person appointments. The patient expressed understanding and agreed to proceed.  Patient location: at home Provider location: in office  Subjective:    CC: COVID 19   HPI: Tested positive for COVID on 12/06/2020 and she has been SOB and doing coughing.  Stomach sore from coughing.   Has gone 24 hour with no fever.  Has been having coughing spells and then will feels SOB. She actually thought she was getting better on Saturday and then yesterday her oxygen had dropped to 88% after her shower.  Pulse ox was 95% at rest.  Using some cough drops. Using Dayquail. Vomited only once bc of mucous.  Children tested positive.  She started feeling feverish on 12/01/2020.   Past medical history, Surgical history, Family history not pertinant except as noted below, Social history, Allergies, and medications have been entered into the medical record, reviewed, and corrections made.   Review of Systems: No fevers, chills, night sweats, weight loss, chest pain, or shortness of breath.   Objective:    General: Speaking clearly in complete sentences without any shortness of breath.  Alert and oriented x3.  Normal judgment. No apparent acute distress.    Impression and Recommendations:    No problem-specific Assessment & Plan notes found for this encounter. COVID -19  -discussed diagnosis and symptoms.  It sounds like she started to feel better and then suddenly got worse yesterday feeling more short of breath and hypoxemic.  We discussed keeping activity just to a minute mom but making sure that she is not also just sitting or lying for really extended periods of time.  But if she can keep her  oxygen above 90 we may be able to keep her out of the emergency department.  And get a send her for a chest x-ray today to evaluate for pneumonia.  I am actually feeling positive and that she has been afebrile for 24 hours and actually feels like her appetite is coming back.  Continue symptomatic care.  She was able to keep her oxygen around 95% during our conversation today which is great.     Time spent in encounter 21 minutes  I discussed the assessment and treatment plan with the patient. The patient was provided an opportunity to ask questions and all were answered. The patient agreed with the plan and demonstrated an understanding of the instructions.   The patient was advised to call back or seek an in-person evaluation if the symptoms worsen or if the condition fails to improve as anticipated.   Beatrice Lecher, MD

## 2020-12-13 NOTE — Telephone Encounter (Signed)
Reports that her O2 is hanging around 90-91. She feels really weak.she has a virtual appt today 140pm. She wanted to know if she should go to the ED.   I told her that she should keep an eye on the O2 and as long as she doesn't drop to or below 88% she should be ok for now but will fwd to pcp for advice.

## 2020-12-18 DIAGNOSIS — Z20822 Contact with and (suspected) exposure to covid-19: Secondary | ICD-10-CM | POA: Diagnosis not present

## 2020-12-24 ENCOUNTER — Ambulatory Visit (INDEPENDENT_AMBULATORY_CARE_PROVIDER_SITE_OTHER): Payer: BC Managed Care – PPO

## 2020-12-24 ENCOUNTER — Other Ambulatory Visit: Payer: Self-pay

## 2020-12-24 ENCOUNTER — Ambulatory Visit (INDEPENDENT_AMBULATORY_CARE_PROVIDER_SITE_OTHER): Payer: BC Managed Care – PPO | Admitting: Sports Medicine

## 2020-12-24 DIAGNOSIS — W009XXA Unspecified fall due to ice and snow, initial encounter: Secondary | ICD-10-CM

## 2020-12-24 DIAGNOSIS — W000XXA Fall on same level due to ice and snow, initial encounter: Secondary | ICD-10-CM | POA: Diagnosis not present

## 2020-12-24 DIAGNOSIS — M25512 Pain in left shoulder: Secondary | ICD-10-CM | POA: Diagnosis not present

## 2020-12-24 MED ORDER — TRAMADOL HCL 50 MG PO TABS: 50.0000 mg | ORAL_TABLET | Freq: Three times a day (TID) | ORAL | 0 refills | Status: DC | PRN

## 2020-12-24 NOTE — Assessment & Plan Note (Signed)
This is a pleasant 60 year old female, she slipped and fell on the ice 3 days ago, impacted her left shoulder and left hip, hip exam is unremarkable, on the left shoulder she has good motion, rotator cuff strength is good, positive speeds test. I think she simply strained her biceps, no labral signs. Adding some tramadol, x-rays, she can return to see me as needed.

## 2020-12-24 NOTE — Progress Notes (Signed)
    Procedures performed today:    None.  Independent interpretation of notes and tests performed by another provider:   None.  Brief History, Exam, Impression, and Recommendations:    Fall from slipping on ice This is a pleasant 60 year old female, she slipped and fell on the ice 3 days ago, impacted her left shoulder and left hip, hip exam is unremarkable, on the left shoulder she has good motion, rotator cuff strength is good, positive speeds test. I think she simply strained her biceps, no labral signs. Adding some tramadol, x-rays, she can return to see me as needed.    ___________________________________________ Gwen Her. Dianah Field, M.D., ABFM., CAQSM. Primary Care and [place] Instructor of [place] [employer] [facility] [employer]

## 2020-12-27 DIAGNOSIS — Z885 Allergy status to narcotic agent status: Secondary | ICD-10-CM | POA: Diagnosis not present

## 2020-12-27 DIAGNOSIS — N390 Urinary tract infection, site not specified: Secondary | ICD-10-CM | POA: Diagnosis not present

## 2020-12-27 DIAGNOSIS — N189 Chronic kidney disease, unspecified: Secondary | ICD-10-CM | POA: Diagnosis not present

## 2020-12-27 DIAGNOSIS — R109 Unspecified abdominal pain: Secondary | ICD-10-CM | POA: Diagnosis not present

## 2020-12-27 DIAGNOSIS — K219 Gastro-esophageal reflux disease without esophagitis: Secondary | ICD-10-CM | POA: Diagnosis not present

## 2020-12-27 DIAGNOSIS — Z91041 Radiographic dye allergy status: Secondary | ICD-10-CM | POA: Diagnosis not present

## 2020-12-27 DIAGNOSIS — Z880 Allergy status to penicillin: Secondary | ICD-10-CM | POA: Diagnosis not present

## 2020-12-27 DIAGNOSIS — K5730 Diverticulosis of large intestine without perforation or abscess without bleeding: Secondary | ICD-10-CM | POA: Diagnosis not present

## 2020-12-27 DIAGNOSIS — N200 Calculus of kidney: Secondary | ICD-10-CM | POA: Diagnosis not present

## 2020-12-27 DIAGNOSIS — Z79899 Other long term (current) drug therapy: Secondary | ICD-10-CM | POA: Diagnosis not present

## 2020-12-27 DIAGNOSIS — R319 Hematuria, unspecified: Secondary | ICD-10-CM | POA: Diagnosis not present

## 2020-12-27 DIAGNOSIS — Z888 Allergy status to other drugs, medicaments and biological substances status: Secondary | ICD-10-CM | POA: Diagnosis not present

## 2020-12-27 DIAGNOSIS — E119 Type 2 diabetes mellitus without complications: Secondary | ICD-10-CM | POA: Diagnosis not present

## 2020-12-27 DIAGNOSIS — K449 Diaphragmatic hernia without obstruction or gangrene: Secondary | ICD-10-CM | POA: Diagnosis not present

## 2020-12-27 DIAGNOSIS — Z7984 Long term (current) use of oral hypoglycemic drugs: Secondary | ICD-10-CM | POA: Diagnosis not present

## 2020-12-27 DIAGNOSIS — Z881 Allergy status to other antibiotic agents status: Secondary | ICD-10-CM | POA: Diagnosis not present

## 2020-12-27 DIAGNOSIS — Z882 Allergy status to sulfonamides status: Secondary | ICD-10-CM | POA: Diagnosis not present

## 2020-12-29 ENCOUNTER — Other Ambulatory Visit: Payer: Self-pay

## 2020-12-29 ENCOUNTER — Ambulatory Visit (INDEPENDENT_AMBULATORY_CARE_PROVIDER_SITE_OTHER): Payer: BC Managed Care – PPO | Admitting: Family Medicine

## 2020-12-29 ENCOUNTER — Encounter: Payer: Self-pay | Admitting: Family Medicine

## 2020-12-29 VITALS — BP 134/74 | HR 71 | Ht 66.0 in | Wt 195.0 lb

## 2020-12-29 DIAGNOSIS — M545 Low back pain, unspecified: Secondary | ICD-10-CM

## 2020-12-29 DIAGNOSIS — N3000 Acute cystitis without hematuria: Secondary | ICD-10-CM

## 2020-12-29 DIAGNOSIS — N200 Calculus of kidney: Secondary | ICD-10-CM

## 2020-12-29 MED ORDER — CYCLOBENZAPRINE HCL 10 MG PO TABS: 10.0000 mg | ORAL_TABLET | Freq: Every evening | ORAL | 0 refills | Status: DC | PRN

## 2020-12-29 NOTE — Progress Notes (Signed)
Acute Office Visit  Subjective:    Patient ID: Nicole Mata, female    DOB: [birth year], 60 y.o.   MRN: [id number]  Chief Complaint  Patient presents with  . Follow-up    HPI Patient is in today for low back and right flank pain.  Ended up going to the emergency department on June 24 because of the pain and discomfort.  They did do a CT abdomen pelvis which showed some small left renal stones but it was not in the ureters and no hydronephrosis to indicate acute obstruction.  She had also had an abnormal urinalysis there and was started on antibiotics for UTI.  Is currently on nitrofurantoin.  She actually recently finished a course of doxycycline earlier in January.  They did send a culture which so far has not had any significant growth.  Starting the antibiotic her pain has become more left sided. Reviewed ED notes.   Now having midline low back pain and stiffness in her back.  No radicular sxs.  She has had similar symptoms before she is not sure if it is a side effect of the antibiotic as she did see back pain listed or if she may have actually tweaked or injured her back but she does not member specific injury or trauma.  Alonna Minium, MD - 12/27/2020  Formatting of this note might be different from the original. TECHNIQUE: Abdomen pelvic CT without contrast. Multiple contiguous axial images obtained. Comparison study 09/18/2020.  HISTORY: Right abdomen/flank pain. Hematuria.  FINDINGS: Limited study due to absence of oral and IV contrast.   A few small nodular densities and interstitial changes both lung bases. Small hiatal hernia. Liver, spleen, pancreas, and adrenal glands show no focal abnormality. Cholecystectomy. Small left renal stones. No significant change small hypodense foci both kidneys. Nonspecific bowel gas pattern. Appendix unremarkable.  Bladder decompressed. Uterus and adnexa stable. A few diverticula sigmoid colon.   IMPRESSION: 1. Couple of small left  renal stones. No hydronephrosis or ureteral stone identified. 2. No significant change small hypodense foci both kidneys. These are suggestive of small parapelvic cysts. If clinical suspicion for occult process is present, nonemergent renal CT without and with IV contrast could be helpful in further evaluation.  Electronically Signed by: Dorthula Perfect   Past Medical History:  Diagnosis Date  . Ankle fracture, right 04/12/13  . Diabetes mellitus without complication ([place])   . Diverticulosis   . Gastropathy    reactive  . GERD (gastroesophageal reflux disease)   . Helicobacter pylori gastritis   . Hiatal hernia   . IBS (irritable bowel syndrome)   . Ileitis   . PONV (postoperative nausea and vomiting)     Past Surgical History:  Procedure Laterality Date  . CHOLECYSTECTOMY  2001  . LAPAROSCOPY  11/19/2012   Procedure: LAPAROSCOPY DIAGNOSTIC;  Surgeon: Joyice Faster. Cornett, MD;  Location: [facility];  Service: General;  Laterality: N/A;  Diagnostic laparoscopy  . SHOULDER SURGERY  2002, 2003   bilateral for bone spurs and torn RC  . UMBILICAL HERNIA REPAIR  11/19/2012   Procedure: LAPAROSCOPIC UMBILICAL HERNIA;  Surgeon: Joyice Faster. Cornett, MD;  Location: [facility];  Service: General;  Laterality: N/A;  ventral hernia repair with mesh    Family History  Problem Relation Age of Onset  . Heart disease Other        family disease  . Heart attack Father 41       grandparents  . Hypertension Father   .  Hyperlipidemia Father   . Diabetes Maternal Grandmother   . Diabetes Maternal Uncle   . Ovarian cancer Maternal Aunt        x 2  . Hypertension Mother   . Hyperlipidemia Mother   . Diabetes type II Mother   . Diabetes Brother   . Ovarian cancer Maternal Aunt   . Colon cancer Neg Hx     Social History   Socioeconomic History  . Marital status: Married    Spouse name: Not on file  . Number of children: 0  . Years of education: Not on file  . Highest education level: Not on file   Occupational History  . Occupation: IRA specialist  Tobacco Use  . Smoking status: Never Smoker  . Smokeless tobacco: Never Used  Vaping Use  . Vaping Use: Never used  Substance and Sexual Activity  . Alcohol use: Yes    Comment: very rarely  . Drug use: No  . Sexual activity: Yes    Partners: Male    Comment: divorced, no kids, customer service, 2 caffeine drinks daily.  Other Topics Concern  . Not on file  Social History Narrative   1 can of soda a day.  No regular exercise.    Social Determinants of Health   Financial Resource Strain: Not on file  Food Insecurity: Not on file  Transportation Needs: Not on file  Physical Activity: Not on file  Stress: Not on file  Social Connections: Not on file  Intimate Partner Violence: Not on file    Outpatient Medications Prior to Visit  Medication Sig Dispense Refill  . allopurinol (ZYLOPRIM) 100 MG tablet Take 1 tablet by mouth daily.    . CONTOUR TEST test strip FOR CHECKING BLOOD SUGAR ONCE DAILY 100 strip 12  . DEXILANT 60 MG capsule Take 1 capsule (60 mg total) by mouth daily before breakfast. 30 capsule 10  . doxycycline (VIBRA-TABS) 100 MG tablet Take 1 tablet (100 mg total) by mouth 2 (two) times daily. 20 tablet 0  . fluticasone (FLONASE) 50 MCG/ACT nasal spray Place 2 sprays into the nose daily. 16 g 2  . gabapentin (NEURONTIN) 100 MG capsule Take 100 mg by mouth at bedtime.    Marland Kitchen glipiZIDE (GLUCOTROL) 10 MG tablet Take 1 tablet (10 mg total) by mouth 2 (two) times daily before a meal. 180 tablet 0  . Insulin Pen Needle (B-D ULTRAFINE III SHORT PEN) 31G X 8 MM MISC INJECT INTO THE SKIN ONCE DAILY AS DIRECTED 100 each PRN  . ketotifen (ZADITOR) 0.025 % ophthalmic solution Place 1 drop into both eyes every morning.    . Lancets (FREESTYLE) lancets For testing blood sugars up to 2 times daily. DX: E11.9. Please make sure had the dispenser/cartridge. Thank you 100 each 12  . Misc Natural Products (APPLE CIDER VINEGAR DIET)  TABS Take 1 tablet by mouth daily. Goli ACV    . pravastatin (PRAVACHOL) 20 MG tablet Take 1 tablet (20 mg total) by mouth every other day. At bedtime. 45 tablet 1  . SitaGLIPtin-MetFORMIN HCl (JANUMET XR) [phone] MG TB24 Take 1 tablet by mouth daily. 90 tablet 1  . traMADol (ULTRAM) 50 MG tablet Take 1-2 tablets (50-100 mg total) by mouth every 8 (eight) hours as needed for moderate pain. Maximum 6 tabs per day. 21 tablet 0   No facility-administered medications prior to visit.    Allergies  Allergen Reactions  . Penicillins Anaphylaxis  . Sulfa Drugs Cross Reactors Anaphylaxis  .  Contrast Media [Iodinated Diagnostic Agents] Hives  . Atorvastatin Other (See Comments)    Muscle aches  . Gabapentin Other (See Comments)    GERD  . Januvia [Sitagliptin] Other (See Comments)    Hot flashes and flushed and dizzy  . Morphine And Related Nausea And Vomiting    Pt chooses not to take it  . Prednisone     Other reaction(s): Confusion  . Azithromycin Other (See Comments)    Other reaction(s): Other Makes her feel really crazy. Makes her feel really crazy.  Marland Kitchen Hydrocodone Nausea And Vomiting  . Invokana [Canagliflozin] Rash    Rash starting on her face    Review of Systems     Objective:    Physical Exam Constitutional:      Appearance: She is well-developed and well-nourished.  HENT:     Head: Normocephalic and atraumatic.  Cardiovascular:     Rate and Rhythm: Normal rate and regular rhythm.     Heart sounds: Normal heart sounds.  Pulmonary:     Effort: Pulmonary effort is normal.     Breath sounds: Normal breath sounds.  Musculoskeletal:     Comments: Nontender of the lumbar spine or SI joints.  Negative straight leg raise bilaterally.  Hip, knee, ankle strength is 5-5.  Patellar reflexes 2+.  She is able to rotate right and left without difficulty as well as side bend.  Pain with flexion and extension.  Skin:    General: Skin is warm and dry.  Neurological:     Mental  Status: She is alert and oriented to person, place, and time.  Psychiatric:        Mood and Affect: Mood and affect normal.        Behavior: Behavior normal.     BP 134/74   Pulse 71   Ht 5\' 6"  (1.676 m)   Wt 195 lb (88.5 kg)   LMP 12/04/2013   SpO2 100%   BMI 31.47 kg/m  Wt Readings from Last 3 Encounters:  12/29/20 195 lb (88.5 kg)  12/01/20 205 lb (93 kg)  11/22/20 208 lb (94.3 kg)    Health Maintenance Due  Topic Date Due  . COVID-19 Vaccine (1) Never done  . OPHTHALMOLOGY EXAM  03/11/2020  . URINE MICROALBUMIN  02/01/2021    There are no preventive care reminders to display for this patient.   Lab Results  Component Value Date   TSH 1.316 10/13/2013   Lab Results  Component Value Date   WBC 8.7 02/04/2020   HGB 12.5 02/04/2020   HCT 37.2 02/04/2020   MCV 89.6 02/04/2020   PLT 308 02/04/2020   Lab Results  Component Value Date   NA 139 11/22/2020   K 4.0 11/22/2020   CO2 29 11/22/2020   GLUCOSE 146 (H) 11/22/2020   BUN 13 11/22/2020   CREATININE 0.74 11/22/2020   BILITOT 0.5 11/22/2020   ALKPHOS 74 07/18/2017   AST 19 11/22/2020   ALT 16 11/22/2020   PROT 7.2 11/22/2020   ALBUMIN 3.9 07/18/2017   CALCIUM 9.0 11/22/2020   Lab Results  Component Value Date   CHOL 169 11/22/2020   Lab Results  Component Value Date   HDL 45 (L) 11/22/2020   Lab Results  Component Value Date   LDLCALC 101 (H) 11/22/2020   Lab Results  Component Value Date   TRIG 130 11/22/2020   Lab Results  Component Value Date   CHOLHDL 3.8 11/22/2020   Lab Results  Component  Value Date   HGBA1C 7.9 (A) 11/22/2020       Assessment & Plan:   Problem List Items Addressed This Visit      Other   Low back pain   Relevant Medications   cyclobenzaprine (FLEXERIL) 10 MG tablet    Other Visit Diagnoses    Kidney stone    -  Primary   Acute cystitis without hematuria         Midline low back pain without radiculopathy is most consistent with musculoskeletal  strain.  Gave her some exercises to do on her own at home and demonstrated how to do those.  Also given a muscle relaxer to use.  She actually already has some Toradol at home so encouraged her to use that at least in the evenings and okay to use the Tylenol during the daytime if she is not improving over the next week please let me know we can always refer to more form of physical therapy.  Kidney stone-it sounds like she actually passed the kidney stone by the time she got to the ED she was actually feeling better and the hematuria has completely resolved.  Though she still has a little bit of soreness but she is significantly better.  Acute cystitis-urine culture so far is negative the final should be back tomorrow from the hospital.  If negative then just make sure to complete the antibiotic and if symptoms return then please let us know immediately so that we can repeat a urinalysis.  Meds ordered this encounter  Medications  . cyclobenzaprine (FLEXERIL) 10 MG tablet    Sig: Take 1 tablet (10 mg total) by mouth at bedtime as needed for muscle spasms.    Dispense:  30 tablet    Refill:  0        Beatrice Lecher, MD

## 2020-12-29 NOTE — Progress Notes (Signed)
Pt was seen at [facility] for flank pain.   She feels like

## 2021-01-14 ENCOUNTER — Other Ambulatory Visit: Payer: Self-pay | Admitting: Family Medicine

## 2021-01-17 ENCOUNTER — Ambulatory Visit (INDEPENDENT_AMBULATORY_CARE_PROVIDER_SITE_OTHER): Payer: BC Managed Care – PPO | Admitting: Physician Assistant

## 2021-01-17 ENCOUNTER — Encounter: Payer: Self-pay | Admitting: Physician Assistant

## 2021-01-17 ENCOUNTER — Telehealth: Payer: Self-pay | Admitting: *Deleted

## 2021-01-17 VITALS — BP 118/80 | HR 93 | Ht 66.0 in | Wt 201.5 lb

## 2021-01-17 DIAGNOSIS — K219 Gastro-esophageal reflux disease without esophagitis: Secondary | ICD-10-CM | POA: Diagnosis not present

## 2021-01-17 DIAGNOSIS — R109 Unspecified abdominal pain: Secondary | ICD-10-CM | POA: Diagnosis not present

## 2021-01-17 DIAGNOSIS — K648 Other hemorrhoids: Secondary | ICD-10-CM | POA: Diagnosis not present

## 2021-01-17 DIAGNOSIS — K589 Irritable bowel syndrome without diarrhea: Secondary | ICD-10-CM | POA: Diagnosis not present

## 2021-01-17 MED ORDER — HYDROCORTISONE ACETATE 25 MG RE SUPP: RECTAL | 6 refills | Status: DC

## 2021-01-17 MED ORDER — DICYCLOMINE HCL 10 MG PO CAPS: 10.0000 mg | ORAL_CAPSULE | Freq: Two times a day (BID) | ORAL | 6 refills | Status: DC

## 2021-01-17 MED ORDER — GABAPENTIN 100 MG PO CAPS: 100.0000 mg | ORAL_CAPSULE | Freq: Every day | ORAL | 1 refills | Status: DC

## 2021-01-17 NOTE — Telephone Encounter (Signed)
rx sent to pharmacy.  It looks like she takes 100 mg nightly hopefully that is correct as that is what I sent to her pharmacy.

## 2021-01-17 NOTE — Patient Instructions (Addendum)
If you are age 58 or older, your body mass index should be between 23-30. Your Body mass index is 32.52 kg/m. If this is out of the aforementioned range listed, please consider follow up with your Primary Care Provider.  If you are age 43 or younger, your body mass index should be between 19-25. Your Body mass index is 32.52 kg/m. If this is out of the aformentioned range listed, please consider follow up with your Primary Care Provider.   START Dicyclomine 10 mg 1 capsule twice daily for abdominal pain/spaims START Hydrocortisone suppositories 1 suppository nightly for 5-7 days as needed for hemorrhoidal symptoms. Continue Dexilant 60 mg 1 capsule every morning.  User Benifiber daily as directed in water or juice.  Follow up as needed.  Thank you for entrusting me with your care and choosing [facility].  Amy Esterwood, PA-C

## 2021-01-17 NOTE — Progress Notes (Signed)
Subjective:    Patient ID: Nicole Mata, female    DOB: 19-Dec-1960, 60 y.o.   MRN: [id number]  HPI Nicole Mata is a 60 year old white female, established with Dr. Henrene Pastor who comes in today for follow-up.  She has history of GERD, IBS, adult onset diabetes mellitus with neuropathy, prior history of H. pylori treated, and history of prior ventral hernia repair. She underwent colonoscopy and EGD in February 2020.  EGD was normal and colonoscopy with finding of 3 small polyps and multiple diverticuli as well as internal hemorrhoids.  Path on the polyps consistent with tubular adenomas and 1 hyperplastic polyp, she is indicated for 5-year interval follow-up. Patient says she has been having some ongoing issues with her abdomen since May 2021.  She had an episode of nausea vomiting and abdominal pressure that became severe at that point and she went to the emergency room and [facility].  She did have CT of the abdomen and pelvis which was negative.  She says she had another recent episode of severe abdominal pain secondary to ureterolithiasis and had CT again in [place] on 12/27/2020 without IV contrast which showed a couple of small left renal stones no hydronephrosis or ureteral stone, and was otherwise negative. She is complaining of a pressure sensation in her abdomen that comes and goes and intermittent episodes of abdominal cramping which can be fairly intense at times.  She describes the abdominal pressure as a stretching or pulling pressure type sensation in the mid abdomen without radiation. She also had been diagnosed with COVID-19 December 01, 2020.  Sometime after that she started having increased issues with hemorrhoids with pressure and discomfort which has since resolved.  Her bowel habits alternate and this has been a chronic issue with no recent change.  Review of Systems.Pertinent positive and negative review of systems were noted in the above HPI section.  All other review of systems was  otherwise negative.  Outpatient Encounter Medications as of 01/17/2021  Medication Sig  . allopurinol (ZYLOPRIM) 100 MG tablet Take 1 tablet by mouth daily.  . CONTOUR TEST test strip FOR CHECKING BLOOD SUGAR ONCE DAILY  . cyclobenzaprine (FLEXERIL) 10 MG tablet Take 1 tablet (10 mg total) by mouth at bedtime as needed for muscle spasms.  . DEXILANT 60 MG capsule Take 1 capsule (60 mg total) by mouth daily before breakfast.  . dicyclomine (BENTYL) 10 MG capsule Take 1 capsule (10 mg total) by mouth 2 (two) times daily.  Marland Kitchen doxycycline (VIBRA-TABS) 100 MG tablet Take 1 tablet (100 mg total) by mouth 2 (two) times daily.  . fluticasone (FLONASE) 50 MCG/ACT nasal spray Place 2 sprays into the nose daily.  Marland Kitchen gabapentin (NEURONTIN) 100 MG capsule Take 100 mg by mouth at bedtime.  Marland Kitchen glipiZIDE (GLUCOTROL) 10 MG tablet TAKE 1 TABLET(10 MG) BY MOUTH TWICE DAILY BEFORE A MEAL  . hydrocortisone (ANUSOL-HC) 25 MG suppository Use 1 suppository at night time for 5 to 7 days as needed for hemorrhoidal symptoms  . Insulin Pen Needle (B-D ULTRAFINE III SHORT PEN) 31G X 8 MM MISC INJECT INTO THE SKIN ONCE DAILY AS DIRECTED  . ketotifen (ZADITOR) 0.025 % ophthalmic solution Place 1 drop into both eyes every morning.  . Lancets (FREESTYLE) lancets For testing blood sugars up to 2 times daily. DX: E11.9. Please make sure had the dispenser/cartridge. Thank you  . Misc Natural Products (APPLE CIDER VINEGAR DIET) TABS Take 1 tablet by mouth daily. Goli ACV  . pravastatin (PRAVACHOL) 20 MG  tablet Take 1 tablet (20 mg total) by mouth every other day. At bedtime.  . SitaGLIPtin-MetFORMIN HCl (JANUMET XR) [phone] MG TB24 Take 1 tablet by mouth daily.  . traMADol (ULTRAM) 50 MG tablet Take 1-2 tablets (50-100 mg total) by mouth every 8 (eight) hours as needed for moderate pain. Maximum 6 tabs per day.   No facility-administered encounter medications on file as of 01/17/2021.   Allergies  Allergen Reactions  . Penicillins  Anaphylaxis  . Sulfa Drugs Cross Reactors Anaphylaxis  . Contrast Media [Iodinated Diagnostic Agents] Hives  . Atorvastatin Other (See Comments)    Muscle aches  . Gabapentin Other (See Comments)    GERD  . Januvia [Sitagliptin] Other (See Comments)    Hot flashes and flushed and dizzy  . Morphine And Related Nausea And Vomiting    Pt chooses not to take it  . Prednisone     Other reaction(s): Confusion  . Azithromycin Other (See Comments)    Other reaction(s): Other Makes her feel really crazy. Makes her feel really crazy.  Marland Kitchen Hydrocodone Nausea And Vomiting  . Invokana [Canagliflozin] Rash    Rash starting on her face   Patient Active Problem List   Diagnosis Date Noted  . Fall from slipping on ice 12/24/2020  . COVID-19 vaccination declined 11/22/2020  . Kidney stones 07/01/2019  . Lumbar spondylosis 06/21/2017  . Diabetic peripheral neuropathy ([place]) 10/04/2016  . Hyperlipidemia 06/18/2015  . Chondromalacia patellae 12/15/2014  . History of kidney stones 12/15/2014  . Controlled diabetes mellitus type 2 with complications ([place]) 40/98/1191  . Menopausal symptom 06/04/2013  . Umbilical hernia 47/82/9562  . Small bowel disease 10/14/2012  . Disorder of intestine 10/14/2012  . Obese 02/06/2012  . Cervical radiculopathy at C7 01/16/2011  . Brachial neuritis 01/16/2011  . Generalized anxiety disorder 04/15/2010  . Flushing 04/15/2010  . Stiffness of joint, not elsewhere classified, other specified site 01/25/2010  . Allergic rhinitis 01/25/2010  . Vitamin D deficiency 10/12/2009  . Other B-complex deficiencies 10/12/2009  . Pain in joints 10/07/2009  . Rosacea 08/23/2009  . Anxiety state 08/23/2009  . Low back pain 04/22/2009  . GERD 12/17/2008  . Diaphragmatic hernia 12/17/2008  . IRRITABLE BOWEL SYNDROME 12/17/2008  . HELICOBACTER PYLORI GASTRITIS, HX OF 12/17/2008   Social History   Socioeconomic History  . Marital status: Married    Spouse name: Not on file   . Number of children: 0  . Years of education: Not on file  . Highest education level: Not on file  Occupational History  . Occupation: IRA specialist  Tobacco Use  . Smoking status: Never Smoker  . Smokeless tobacco: Never Used  Vaping Use  . Vaping Use: Never used  Substance and Sexual Activity  . Alcohol use: Yes    Comment: very rarely  . Drug use: No  . Sexual activity: Yes    Partners: Male    Comment: divorced, no kids, customer service, 2 caffeine drinks daily.  Other Topics Concern  . Not on file  Social History Narrative   1 can of soda a day.  No regular exercise.    Social Determinants of Health   Financial Resource Strain: Not on file  Food Insecurity: Not on file  Transportation Needs: Not on file  Physical Activity: Not on file  Stress: Not on file  Social Connections: Not on file  Intimate Partner Violence: Not on file    Nicole Mata's family history includes Diabetes in her brother, maternal grandmother,  and maternal uncle; Diabetes type II in her mother; Heart attack (age of onset: 6) in her father; Heart disease in an other family member; Hyperlipidemia in her father and mother; Hypertension in her father and mother; Ovarian cancer in her maternal aunt and maternal aunt.      Objective:    Vitals:   01/17/21 0830  BP: 118/80  Pulse: 93    Physical Exam Well-developed well-nourished female  in no acute distress.  Height, Weight, BMI32.5  HEENT; nontraumatic normocephalic, EOMI, PE R LA, sclera anicteric. Oropharynx;not examined Neck; supple, no JVD Cardiovascular; regular rate and rhythm with S1-S2, no murmur rub or gallop Pulmonary; Clear bilaterally Abdomen; soft, no focal tenderness, nondistended, no palpable mass or hepatosplenomegaly, bowel sounds are active Rectal; not done today Skin; benign exam, no jaundice rash or appreciable lesions Extremities; no clubbing cyanosis or edema skin warm and dry Neuro/Psych; alert and oriented x4,  grossly nonfocal mood and affect appropriate       Assessment & Plan:   #64 60 year old female with chronic GERD-stable on Dexilant 60 mg every morning #2 history of adenomatous colon polyps-up-to-date with colonoscopy done February 2020 and due for 5-year interval follow-up 3.  Diverticulosis 4.  Intermittent abdominal pressure, mid abdomen, discomfort and cramping 2 CT scans in the past year showed no abnormality of the bowel.  She does have history of ureterolithiasis.  She has had prior ventral hernia repair and I suspect her mid abdominal discomfort may be secondary to IBS, she may also have intermittent symptoms secondary to adhesions. #5 adult onset diabetes mellitus with neuropathy #6 symptomatic internal hemorrhoids  Plan; continue Dexilant 60 mg p.o. every morning Start  Trial of Bentyl 10 mg p.o. twice daily Start Benefiber daily, continue liberal water intake. Anusol HC suppositories 1 per rectum nightly x5 to 7 days as needed for internal hemorrhoid symptoms, prescription sent Patient will follow up as needed  Orie Cuttino Genia Harold PA-C 01/17/2021   Cc: Hali Marry, *

## 2021-01-17 NOTE — Telephone Encounter (Signed)
Pt called this afternoon wanting to know if you would be willing to send in Gabapentin for her.  She used to get in from Dr. Audelia Hives from [place] but they want her to make an appointment before they will fill it.  She would prefer to not see him for refills.  Please advise.

## 2021-01-18 ENCOUNTER — Other Ambulatory Visit: Payer: Self-pay | Admitting: Family Medicine

## 2021-01-18 NOTE — Telephone Encounter (Signed)
Pt notified of med refill.  The 100mg  dose was correct.

## 2021-01-18 NOTE — Progress Notes (Signed)
Assessment and plan reviewed 

## 2021-02-21 ENCOUNTER — Ambulatory Visit: Payer: BC Managed Care – PPO | Admitting: Family Medicine

## 2021-02-21 ENCOUNTER — Other Ambulatory Visit: Payer: Self-pay | Admitting: *Deleted

## 2021-02-21 MED ORDER — DEXILANT 60 MG PO CPDR: 60.0000 mg | DELAYED_RELEASE_CAPSULE | Freq: Every day | ORAL | 10 refills | Status: DC

## 2021-02-24 DIAGNOSIS — K449 Diaphragmatic hernia without obstruction or gangrene: Secondary | ICD-10-CM | POA: Diagnosis not present

## 2021-02-24 DIAGNOSIS — Z888 Allergy status to other drugs, medicaments and biological substances status: Secondary | ICD-10-CM | POA: Diagnosis not present

## 2021-02-24 DIAGNOSIS — N281 Cyst of kidney, acquired: Secondary | ICD-10-CM | POA: Diagnosis not present

## 2021-02-24 DIAGNOSIS — Z7984 Long term (current) use of oral hypoglycemic drugs: Secondary | ICD-10-CM | POA: Diagnosis not present

## 2021-02-24 DIAGNOSIS — K219 Gastro-esophageal reflux disease without esophagitis: Secondary | ICD-10-CM | POA: Diagnosis not present

## 2021-02-24 DIAGNOSIS — Z885 Allergy status to narcotic agent status: Secondary | ICD-10-CM | POA: Diagnosis not present

## 2021-02-24 DIAGNOSIS — R350 Frequency of micturition: Secondary | ICD-10-CM | POA: Diagnosis not present

## 2021-02-24 DIAGNOSIS — Z882 Allergy status to sulfonamides status: Secondary | ICD-10-CM | POA: Diagnosis not present

## 2021-02-24 DIAGNOSIS — K5730 Diverticulosis of large intestine without perforation or abscess without bleeding: Secondary | ICD-10-CM | POA: Diagnosis not present

## 2021-02-24 DIAGNOSIS — Z881 Allergy status to other antibiotic agents status: Secondary | ICD-10-CM | POA: Diagnosis not present

## 2021-02-24 DIAGNOSIS — Z91041 Radiographic dye allergy status: Secondary | ICD-10-CM | POA: Diagnosis not present

## 2021-02-24 DIAGNOSIS — Z79899 Other long term (current) drug therapy: Secondary | ICD-10-CM | POA: Diagnosis not present

## 2021-02-24 DIAGNOSIS — R109 Unspecified abdominal pain: Secondary | ICD-10-CM | POA: Diagnosis not present

## 2021-02-24 DIAGNOSIS — N132 Hydronephrosis with renal and ureteral calculous obstruction: Secondary | ICD-10-CM | POA: Diagnosis not present

## 2021-02-24 DIAGNOSIS — N189 Chronic kidney disease, unspecified: Secondary | ICD-10-CM | POA: Diagnosis not present

## 2021-02-24 DIAGNOSIS — R110 Nausea: Secondary | ICD-10-CM | POA: Diagnosis not present

## 2021-02-24 DIAGNOSIS — Z886 Allergy status to analgesic agent status: Secondary | ICD-10-CM | POA: Diagnosis not present

## 2021-02-24 DIAGNOSIS — E1122 Type 2 diabetes mellitus with diabetic chronic kidney disease: Secondary | ICD-10-CM | POA: Diagnosis not present

## 2021-02-24 DIAGNOSIS — Z880 Allergy status to penicillin: Secondary | ICD-10-CM | POA: Diagnosis not present

## 2021-02-25 ENCOUNTER — Ambulatory Visit: Payer: BC Managed Care – PPO | Admitting: Medical-Surgical

## 2021-02-25 DIAGNOSIS — R8271 Bacteriuria: Secondary | ICD-10-CM | POA: Diagnosis not present

## 2021-02-25 DIAGNOSIS — N200 Calculus of kidney: Secondary | ICD-10-CM | POA: Diagnosis not present

## 2021-02-25 DIAGNOSIS — N281 Cyst of kidney, acquired: Secondary | ICD-10-CM | POA: Diagnosis not present

## 2021-03-25 DIAGNOSIS — N281 Cyst of kidney, acquired: Secondary | ICD-10-CM | POA: Diagnosis not present

## 2021-03-30 DIAGNOSIS — K5730 Diverticulosis of large intestine without perforation or abscess without bleeding: Secondary | ICD-10-CM | POA: Diagnosis not present

## 2021-03-30 DIAGNOSIS — K449 Diaphragmatic hernia without obstruction or gangrene: Secondary | ICD-10-CM | POA: Diagnosis not present

## 2021-03-30 DIAGNOSIS — N281 Cyst of kidney, acquired: Secondary | ICD-10-CM | POA: Diagnosis not present

## 2021-03-30 DIAGNOSIS — N200 Calculus of kidney: Secondary | ICD-10-CM | POA: Diagnosis not present

## 2021-04-04 ENCOUNTER — Encounter: Payer: Self-pay | Admitting: Family Medicine

## 2021-04-04 ENCOUNTER — Other Ambulatory Visit: Payer: Self-pay

## 2021-04-04 ENCOUNTER — Ambulatory Visit: Payer: BC Managed Care – PPO | Admitting: Family Medicine

## 2021-04-04 VITALS — BP 127/71 | HR 81 | Ht 67.0 in | Wt 202.0 lb

## 2021-04-04 DIAGNOSIS — J019 Acute sinusitis, unspecified: Secondary | ICD-10-CM | POA: Diagnosis not present

## 2021-04-04 MED ORDER — DOXYCYCLINE HYCLATE 100 MG PO TABS: 100.0000 mg | ORAL_TABLET | Freq: Two times a day (BID) | ORAL | 0 refills | Status: DC

## 2021-04-04 NOTE — Progress Notes (Signed)
Acute Office Visit  Subjective:    Patient ID: Nicole Mata, female    DOB: [birth year], 60 y.o.   MRN: [id number]  Chief Complaint  Patient presents with  . Sinusitis    HPI Patient is in today for sinus congestion for about 3 weeks.  She says it really just is not getting any better she has been using Tylenol Sinus as well as her Flonase in the morning.  She has been starting to get some eye twitching on the left side of her forehead and feeling like she is getting tingling in the left lip.  She definitely feels like the pressure is worse on the left side of her face she says even just bending over to put her shoe on is painful.  No fevers chills or sweats.  No sore throat.  No GI symptoms.  She has had a little bit of ear pain.  Past Medical History:  Diagnosis Date  . Ankle fracture, right 04/12/13  . Diabetes mellitus without complication ([place])   . Diverticulosis   . Gastropathy    reactive  . GERD (gastroesophageal reflux disease)   . Helicobacter pylori gastritis   . Hiatal hernia   . IBS (irritable bowel syndrome)   . Ileitis   . PONV (postoperative nausea and vomiting)     Past Surgical History:  Procedure Laterality Date  . CHOLECYSTECTOMY  2001  . LAPAROSCOPY  11/19/2012   Procedure: LAPAROSCOPY DIAGNOSTIC;  Surgeon: Joyice Faster. Cornett, MD;  Location: [facility];  Service: General;  Laterality: N/A;  Diagnostic laparoscopy  . SHOULDER SURGERY  2002, 2003   bilateral for bone spurs and torn RC  . UMBILICAL HERNIA REPAIR  11/19/2012   Procedure: LAPAROSCOPIC UMBILICAL HERNIA;  Surgeon: Joyice Faster. Cornett, MD;  Location: [facility];  Service: General;  Laterality: N/A;  ventral hernia repair with mesh    Family History  Problem Relation Age of Onset  . Heart disease Other        family disease  . Heart attack Father 61       grandparents  . Hypertension Father   . Hyperlipidemia Father   . Diabetes Maternal Grandmother   . Diabetes Maternal Uncle   . Ovarian cancer  Maternal Aunt        x 2  . Hypertension Mother   . Hyperlipidemia Mother   . Diabetes type II Mother   . Diabetes Brother   . Ovarian cancer Maternal Aunt   . Colon cancer Neg Hx     Social History   Socioeconomic History  . Marital status: Married    Spouse name: Not on file  . Number of children: 0  . Years of education: Not on file  . Highest education level: Not on file  Occupational History  . Occupation: IRA specialist  Tobacco Use  . Smoking status: Never Smoker  . Smokeless tobacco: Never Used  Vaping Use  . Vaping Use: Never used  Substance and Sexual Activity  . Alcohol use: Yes    Comment: very rarely  . Drug use: No  . Sexual activity: Yes    Partners: Male    Comment: divorced, no kids, customer service, 2 caffeine drinks daily.  Other Topics Concern  . Not on file  Social History Narrative   1 can of soda a day.  No regular exercise.    Social Determinants of Health   Financial Resource Strain: Not on file  Food Insecurity: Not on file  Transportation Needs: Not on file  Physical Activity: Not on file  Stress: Not on file  Social Connections: Not on file  Intimate Partner Violence: Not on file    Outpatient Medications Prior to Visit  Medication Sig Dispense Refill  . allopurinol (ZYLOPRIM) 100 MG tablet Take 1 tablet by mouth daily.    . CONTOUR TEST test strip FOR CHECKING BLOOD SUGAR ONCE DAILY 100 strip 12  . cyclobenzaprine (FLEXERIL) 10 MG tablet Take 1 tablet (10 mg total) by mouth at bedtime as needed for muscle spasms. 30 tablet 0  . DEXILANT 60 MG capsule Take 1 capsule (60 mg total) by mouth daily before breakfast. 30 capsule 10  . dicyclomine (BENTYL) 10 MG capsule Take 1 capsule (10 mg total) by mouth 2 (two) times daily. 60 capsule 6  . fluticasone (FLONASE) 50 MCG/ACT nasal spray Place 2 sprays into the nose daily. 16 g 2  . gabapentin (NEURONTIN) 100 MG capsule Take 1 capsule (100 mg total) by mouth at bedtime. 90 capsule 1  .  glipiZIDE (GLUCOTROL) 10 MG tablet TAKE 1 TABLET(10 MG) BY MOUTH TWICE DAILY BEFORE A MEAL 180 tablet 0  . hydrocortisone (ANUSOL-HC) 25 MG suppository Use 1 suppository at night time for 5 to 7 days as needed for hemorrhoidal symptoms 12 suppository 6  . Insulin Pen Needle (B-D ULTRAFINE III SHORT PEN) 31G X 8 MM MISC INJECT INTO THE SKIN ONCE DAILY AS DIRECTED 100 each PRN  . ketotifen (ZADITOR) 0.025 % ophthalmic solution Place 1 drop into both eyes every morning.    . Lancets (FREESTYLE) lancets For testing blood sugars up to 2 times daily. DX: E11.9. Please make sure had the dispenser/cartridge. Thank you 100 each 12  . Misc Natural Products (APPLE CIDER VINEGAR DIET) TABS Take 1 tablet by mouth daily. Goli ACV    . pravastatin (PRAVACHOL) 20 MG tablet Take 1 tablet (20 mg total) by mouth every other day. At bedtime. 45 tablet 1  . SitaGLIPtin-MetFORMIN HCl (JANUMET XR) [phone] MG TB24 Take 1 tablet by mouth daily. 90 tablet 1  . traMADol (ULTRAM) 50 MG tablet Take 1-2 tablets (50-100 mg total) by mouth every 8 (eight) hours as needed for moderate pain. Maximum 6 tabs per day. 21 tablet 0   No facility-administered medications prior to visit.    Allergies  Allergen Reactions  . Penicillins Anaphylaxis  . Sulfa Drugs Cross Reactors Anaphylaxis  . Contrast Media [Iodinated Diagnostic Agents] Hives  . Atorvastatin Other (See Comments)    Muscle aches  . Gabapentin Other (See Comments)    GERD  . Januvia [Sitagliptin] Other (See Comments)    Hot flashes and flushed and dizzy  . Morphine And Related Nausea And Vomiting    Pt chooses not to take it  . Prednisone     Other reaction(s): Confusion  . Azithromycin Other (See Comments)    Other reaction(s): Other Makes her feel really crazy. Makes her feel really crazy.  Marland Kitchen Hydrocodone Nausea And Vomiting  . Invokana [Canagliflozin] Rash    Rash starting on her face    Review of Systems     Objective:    Physical  Exam Constitutional:      Appearance: She is well-developed.  HENT:     Head: Normocephalic and atraumatic.     Right Ear: External ear normal.     Left Ear: External ear normal.     Nose: Nose normal.  Eyes:     Conjunctiva/sclera: Conjunctivae normal.  Pupils: Pupils are equal, round, and reactive to light.  Neck:     Thyroid: No thyromegaly.  Cardiovascular:     Rate and Rhythm: Normal rate and regular rhythm.     Heart sounds: Normal heart sounds.  Pulmonary:     Effort: Pulmonary effort is normal.     Breath sounds: Normal breath sounds. No wheezing.  Musculoskeletal:     Cervical back: Neck supple.  Lymphadenopathy:     Cervical: No cervical adenopathy.  Skin:    General: Skin is warm and dry.  Neurological:     Mental Status: She is alert and oriented to person, place, and time.     BP 127/71   Pulse 81   Ht 5\' 7"  (1.702 m)   Wt 202 lb (91.6 kg)   LMP 12/04/2013   SpO2 99%   BMI 31.64 kg/m  Wt Readings from Last 3 Encounters:  04/04/21 202 lb (91.6 kg)  01/17/21 201 lb 8 oz (91.4 kg)  12/29/20 195 lb (88.5 kg)    Health Maintenance Due  Topic Date Due  . COVID-19 Vaccine (1) Never done  . OPHTHALMOLOGY EXAM  03/11/2020  . FOOT EXAM  02/01/2021  . URINE MICROALBUMIN  02/01/2021    There are no preventive care reminders to display for this patient.   Lab Results  Component Value Date   TSH 1.316 10/13/2013   Lab Results  Component Value Date   WBC 8.7 02/04/2020   HGB 12.5 02/04/2020   HCT 37.2 02/04/2020   MCV 89.6 02/04/2020   PLT 308 02/04/2020   Lab Results  Component Value Date   NA 139 11/22/2020   K 4.0 11/22/2020   CO2 29 11/22/2020   GLUCOSE 146 (H) 11/22/2020   BUN 13 11/22/2020   CREATININE 0.74 11/22/2020   BILITOT 0.5 11/22/2020   ALKPHOS 74 07/18/2017   AST 19 11/22/2020   ALT 16 11/22/2020   PROT 7.2 11/22/2020   ALBUMIN 3.9 07/18/2017   CALCIUM 9.0 11/22/2020   Lab Results  Component Value Date   CHOL 169  11/22/2020   Lab Results  Component Value Date   HDL 45 (L) 11/22/2020   Lab Results  Component Value Date   LDLCALC 101 (H) 11/22/2020   Lab Results  Component Value Date   TRIG 130 11/22/2020   Lab Results  Component Value Date   CHOLHDL 3.8 11/22/2020   Lab Results  Component Value Date   HGBA1C 7.9 (A) 11/22/2020       Assessment & Plan:   Problem List Items Addressed This Visit   None   Visit Diagnoses    Acute non-recurrent sinusitis, unspecified location    -  Primary   Relevant Medications   doxycycline (VIBRA-TABS) 100 MG tablet     Acute sinusitis-we will treat with doxycycline continue with Flonase.  Also recommended trial of nasal saline irrigation which is also been shown to be helpful.  Call if not better in 1 week.  She has a routine follow-up scheduled in 2 weeks.  Meds ordered this encounter  Medications  . doxycycline (VIBRA-TABS) 100 MG tablet    Sig: Take 1 tablet (100 mg total) by mouth 2 (two) times daily.    Dispense:  14 tablet    Refill:  0     Beatrice Lecher, MD

## 2021-04-04 NOTE — Progress Notes (Signed)
sxs x 3 wks pressure on L side of face and under L eye. Some drainage this has been clear. Bilateral ear pain.   She has been taking flonase, tylenol sinus

## 2021-04-04 NOTE — Patient Instructions (Signed)

## 2021-04-14 DIAGNOSIS — N281 Cyst of kidney, acquired: Secondary | ICD-10-CM | POA: Diagnosis not present

## 2021-04-14 DIAGNOSIS — R8271 Bacteriuria: Secondary | ICD-10-CM | POA: Diagnosis not present

## 2021-04-14 DIAGNOSIS — N200 Calculus of kidney: Secondary | ICD-10-CM | POA: Diagnosis not present

## 2021-04-18 ENCOUNTER — Ambulatory Visit: Payer: BC Managed Care – PPO | Admitting: Family Medicine

## 2021-04-18 ENCOUNTER — Other Ambulatory Visit: Payer: Self-pay | Admitting: *Deleted

## 2021-04-18 ENCOUNTER — Encounter: Payer: Self-pay | Admitting: Family Medicine

## 2021-04-18 ENCOUNTER — Other Ambulatory Visit: Payer: Self-pay

## 2021-04-18 VITALS — BP 127/73 | HR 87 | Ht 67.0 in | Wt 202.0 lb

## 2021-04-18 DIAGNOSIS — K449 Diaphragmatic hernia without obstruction or gangrene: Secondary | ICD-10-CM

## 2021-04-18 DIAGNOSIS — N200 Calculus of kidney: Secondary | ICD-10-CM

## 2021-04-18 DIAGNOSIS — E118 Type 2 diabetes mellitus with unspecified complications: Secondary | ICD-10-CM | POA: Diagnosis not present

## 2021-04-18 DIAGNOSIS — E11649 Type 2 diabetes mellitus with hypoglycemia without coma: Secondary | ICD-10-CM

## 2021-04-18 LAB — POCT UA - MICROALBUMIN
Creatinine, POC: 200 mg/dL
Microalbumin Ur, POC: 80 mg/L

## 2021-04-18 LAB — POCT GLYCOSYLATED HEMOGLOBIN (HGB A1C): HbA1c, POC (controlled diabetic range): 8.5 % — AB (ref 0.0–7.0)

## 2021-04-18 MED ORDER — BD PEN NEEDLE SHORT U/F 31G X 8 MM MISC: 99 refills | Status: DC

## 2021-04-18 MED ORDER — TRESIBA FLEXTOUCH 100 UNIT/ML ~~LOC~~ SOPN: 30.0000 [IU] | PEN_INJECTOR | Freq: Every day | SUBCUTANEOUS | 2 refills | Status: DC

## 2021-04-18 MED ORDER — JANUMET XR 100-1000 MG PO TB24: 1.0000 | ORAL_TABLET | Freq: Every day | ORAL | 1 refills | Status: DC

## 2021-04-18 NOTE — Assessment & Plan Note (Addendum)
Uncontrolled. A1C up to 8.5. discussed options. Will stop the glipizide and start Tresiba. Start 10 units and then increase by 1 units a day until sugars are less than 130.

## 2021-04-18 NOTE — Assessment & Plan Note (Signed)
Unfortunately she still has a kidney stone in the left kidney.  She did bring me her updated CT that she just had done at the urologist office.  She is also having a little bit of burning with urination and I did a urine sample and she is awaiting those results today.

## 2021-04-18 NOTE — Progress Notes (Signed)
Established Patient Office Visit  Subjective:  Patient ID: Nicole Mata, female    DOB: 1961/01/01  Age: 60 y.o. MRN: [id number]  CC:  Chief Complaint  Patient presents with  . Diabetes    HPI Nicole Mata presents for   Diabetes - no hypoglycemic events. No wounds or sores that are not healing well. No increased thirst or urination. Checking glucose at home. Taking medications as prescribed without any side effects.  She has been able to work from home 3 days per week.  She has been trying to walk some but says recently she dropped off.     Past Medical History:  Diagnosis Date  . Ankle fracture, right 04/12/13  . Diabetes mellitus without complication ([place])   . Diverticulosis   . Gastropathy    reactive  . GERD (gastroesophageal reflux disease)   . Helicobacter pylori gastritis   . Hiatal hernia   . IBS (irritable bowel syndrome)   . Ileitis   . PONV (postoperative nausea and vomiting)     Past Surgical History:  Procedure Laterality Date  . CHOLECYSTECTOMY  2001  . LAPAROSCOPY  11/19/2012   Procedure: LAPAROSCOPY DIAGNOSTIC;  Surgeon: Joyice Faster. Cornett, MD;  Location: [facility];  Service: General;  Laterality: N/A;  Diagnostic laparoscopy  . SHOULDER SURGERY  2002, 2003   bilateral for bone spurs and torn RC  . UMBILICAL HERNIA REPAIR  11/19/2012   Procedure: LAPAROSCOPIC UMBILICAL HERNIA;  Surgeon: Joyice Faster. Cornett, MD;  Location: [facility];  Service: General;  Laterality: N/A;  ventral hernia repair with mesh    Family History  Problem Relation Age of Onset  . Heart disease Other        family disease  . Heart attack Father 77       grandparents  . Hypertension Father   . Hyperlipidemia Father   . Diabetes Maternal Grandmother   . Diabetes Maternal Uncle   . Ovarian cancer Maternal Aunt        x 2  . Hypertension Mother   . Hyperlipidemia Mother   . Diabetes type II Mother   . Diabetes Brother   . Ovarian cancer Maternal Aunt   . Colon cancer Neg Hx      Social History   Socioeconomic History  . Marital status: Married    Spouse name: Not on file  . Number of children: 0  . Years of education: Not on file  . Highest education level: Not on file  Occupational History  . Occupation: IRA specialist  Tobacco Use  . Smoking status: Never Smoker  . Smokeless tobacco: Never Used  Vaping Use  . Vaping Use: Never used  Substance and Sexual Activity  . Alcohol use: Yes    Comment: very rarely  . Drug use: No  . Sexual activity: Yes    Partners: Male    Comment: divorced, no kids, customer service, 2 caffeine drinks daily.  Other Topics Concern  . Not on file  Social History Narrative   1 can of soda a day.  No regular exercise.    Social Determinants of Health   Financial Resource Strain: Not on file  Food Insecurity: Not on file  Transportation Needs: Not on file  Physical Activity: Not on file  Stress: Not on file  Social Connections: Not on file  Intimate Partner Violence: Not on file    Outpatient Medications Prior to Visit  Medication Sig Dispense Refill  . CONTOUR TEST test strip  FOR CHECKING BLOOD SUGAR ONCE DAILY 100 strip 12  . DEXILANT 60 MG capsule Take 1 capsule (60 mg total) by mouth daily before breakfast. 30 capsule 10  . dicyclomine (BENTYL) 10 MG capsule Take 1 capsule (10 mg total) by mouth 2 (two) times daily. 60 capsule 6  . fluticasone (FLONASE) 50 MCG/ACT nasal spray Place 2 sprays into the nose daily. 16 g 2  . gabapentin (NEURONTIN) 100 MG capsule Take 1 capsule (100 mg total) by mouth at bedtime. 90 capsule 1  . hydrocortisone (ANUSOL-HC) 25 MG suppository Use 1 suppository at night time for 5 to 7 days as needed for hemorrhoidal symptoms 12 suppository 6  . ketotifen (ZADITOR) 0.025 % ophthalmic solution Place 1 drop into both eyes every morning.    . Lancets (FREESTYLE) lancets For testing blood sugars up to 2 times daily. DX: E11.9. Please make sure had the dispenser/cartridge. Thank you 100 each  12  . Misc Natural Products (APPLE CIDER VINEGAR DIET) TABS Take 1 tablet by mouth daily. Goli ACV    . pravastatin (PRAVACHOL) 20 MG tablet Take 1 tablet (20 mg total) by mouth every other day. At bedtime. 45 tablet 1  . glipiZIDE (GLUCOTROL) 10 MG tablet TAKE 1 TABLET(10 MG) BY MOUTH TWICE DAILY BEFORE A MEAL 180 tablet 0  . SitaGLIPtin-MetFORMIN HCl (JANUMET XR) [phone] MG TB24 Take 1 tablet by mouth daily. 90 tablet 1  . allopurinol (ZYLOPRIM) 100 MG tablet Take 1 tablet by mouth daily.    . cyclobenzaprine (FLEXERIL) 10 MG tablet Take 1 tablet (10 mg total) by mouth at bedtime as needed for muscle spasms. 30 tablet 0  . doxycycline (VIBRA-TABS) 100 MG tablet Take 1 tablet (100 mg total) by mouth 2 (two) times daily. 14 tablet 0  . Insulin Pen Needle (B-D ULTRAFINE III SHORT PEN) 31G X 8 MM MISC INJECT INTO THE SKIN ONCE DAILY AS DIRECTED 100 each PRN  . traMADol (ULTRAM) 50 MG tablet Take 1-2 tablets (50-100 mg total) by mouth every 8 (eight) hours as needed for moderate pain. Maximum 6 tabs per day. 21 tablet 0   No facility-administered medications prior to visit.    Allergies  Allergen Reactions  . Penicillins Anaphylaxis  . Sulfa Drugs Cross Reactors Anaphylaxis  . Contrast Media [Iodinated Diagnostic Agents] Hives  . Atorvastatin Other (See Comments)    Muscle aches  . Gabapentin Other (See Comments)    GERD  . Januvia [Sitagliptin] Other (See Comments)    Hot flashes and flushed and dizzy  . Morphine And Related Nausea And Vomiting    Pt chooses not to take it  . Prednisone     Other reaction(s): Confusion  . Azithromycin Other (See Comments)    Other reaction(s): Other Makes her feel really crazy. Makes her feel really crazy.  Marland Kitchen Hydrocodone Nausea And Vomiting  . Invokana [Canagliflozin] Rash    Rash starting on her face    ROS Review of Systems    Objective:    Physical Exam Constitutional:      Appearance: She is well-developed.  HENT:     Head:  Normocephalic and atraumatic.  Cardiovascular:     Rate and Rhythm: Normal rate and regular rhythm.     Heart sounds: Normal heart sounds.  Pulmonary:     Effort: Pulmonary effort is normal.     Breath sounds: Normal breath sounds.  Skin:    General: Skin is warm and dry.  Neurological:     Mental  Status: She is alert and oriented to person, place, and time.  Psychiatric:        Behavior: Behavior normal.     BP 127/73   Pulse 87   Ht 5\' 7"  (1.702 m)   Wt 202 lb (91.6 kg)   LMP 12/04/2013   SpO2 97%   BMI 31.64 kg/m  Wt Readings from Last 3 Encounters:  04/18/21 202 lb (91.6 kg)  04/04/21 202 lb (91.6 kg)  01/17/21 201 lb 8 oz (91.4 kg)     Health Maintenance Due  Topic Date Due  . COVID-19 Vaccine (1) Never done    There are no preventive care reminders to display for this patient.  Lab Results  Component Value Date   TSH 1.316 10/13/2013   Lab Results  Component Value Date   WBC 8.7 02/04/2020   HGB 12.5 02/04/2020   HCT 37.2 02/04/2020   MCV 89.6 02/04/2020   PLT 308 02/04/2020   Lab Results  Component Value Date   NA 139 11/22/2020   K 4.0 11/22/2020   CO2 29 11/22/2020   GLUCOSE 146 (H) 11/22/2020   BUN 13 11/22/2020   CREATININE 0.74 11/22/2020   BILITOT 0.5 11/22/2020   ALKPHOS 74 07/18/2017   AST 19 11/22/2020   ALT 16 11/22/2020   PROT 7.2 11/22/2020   ALBUMIN 3.9 07/18/2017   CALCIUM 9.0 11/22/2020   Lab Results  Component Value Date   CHOL 169 11/22/2020   Lab Results  Component Value Date   HDL 45 (L) 11/22/2020   Lab Results  Component Value Date   LDLCALC 101 (H) 11/22/2020   Lab Results  Component Value Date   TRIG 130 11/22/2020   Lab Results  Component Value Date   CHOLHDL 3.8 11/22/2020   Lab Results  Component Value Date   HGBA1C 8.5 (A) 04/18/2021      Assessment & Plan:   Problem List Items Addressed This Visit      Respiratory   Diaphragmatic hernia     Endocrine   Controlled diabetes mellitus  type 2 with complications ([place]) - Primary    Uncontrolled. A1C up to 8.5. discussed options. Will stop the glipizide and start Tresiba. Start 10 units and then increase by 1 units a day until sugars are less than 130.        Relevant Medications   insulin degludec (TRESIBA FLEXTOUCH) 100 UNIT/ML FlexTouch Pen   SitaGLIPtin-MetFORMIN HCl (JANUMET XR) [phone] MG TB24   Other Relevant Orders   POCT glycosylated hemoglobin (Hb A1C) (Completed)   POCT UA - Microalbumin (Completed)     Genitourinary   Kidney stones    Unfortunately she still has a kidney stone in the left kidney.  She did bring me her updated CT that she just had done at the urologist office.  She is also having a little bit of burning with urination and I did a urine sample and she is awaiting those results today.         Meds ordered this encounter  Medications  . insulin degludec (TRESIBA FLEXTOUCH) 100 UNIT/ML FlexTouch Pen    Sig: Inject 30-40 Units into the skin daily.    Dispense:  9 mL    Refill:  2  . SitaGLIPtin-MetFORMIN HCl (JANUMET XR) [phone] MG TB24    Sig: Take 1 tablet by mouth daily.    Dispense:  90 tablet    Refill:  1    Follow-up: Return in about 3 months (around  07/19/2021) for Diabetes follow-up.    Nani Gasser, MD

## 2021-04-18 NOTE — Patient Instructions (Addendum)
Please stop glipizid. Start Tresiba 10 units in the evening/bedtime. Increase by 1 unit a day until glucose in the AM is less than 130. Then stay at that dose for 2 weeks. Record sugars for those 2 weeks and then call with those numbers.

## 2021-05-19 ENCOUNTER — Ambulatory Visit (INDEPENDENT_AMBULATORY_CARE_PROVIDER_SITE_OTHER): Payer: BC Managed Care – PPO

## 2021-05-19 ENCOUNTER — Ambulatory Visit: Payer: BC Managed Care – PPO | Admitting: Sports Medicine

## 2021-05-19 ENCOUNTER — Other Ambulatory Visit: Payer: Self-pay | Admitting: Sports Medicine

## 2021-05-19 ENCOUNTER — Encounter: Payer: Self-pay | Admitting: Sports Medicine

## 2021-05-19 ENCOUNTER — Other Ambulatory Visit: Payer: Self-pay

## 2021-05-19 DIAGNOSIS — S90121A Contusion of right lesser toe(s) without damage to nail, initial encounter: Secondary | ICD-10-CM

## 2021-05-19 DIAGNOSIS — M79672 Pain in left foot: Secondary | ICD-10-CM

## 2021-05-19 DIAGNOSIS — M722 Plantar fascial fibromatosis: Secondary | ICD-10-CM

## 2021-05-19 DIAGNOSIS — G8929 Other chronic pain: Secondary | ICD-10-CM

## 2021-05-19 DIAGNOSIS — M7750 Other enthesopathy of unspecified foot: Secondary | ICD-10-CM

## 2021-05-19 DIAGNOSIS — M79671 Pain in right foot: Secondary | ICD-10-CM

## 2021-05-19 DIAGNOSIS — S9002XA Contusion of left ankle, initial encounter: Secondary | ICD-10-CM

## 2021-05-19 DIAGNOSIS — S93601A Unspecified sprain of right foot, initial encounter: Secondary | ICD-10-CM

## 2021-05-19 DIAGNOSIS — M778 Other enthesopathies, not elsewhere classified: Secondary | ICD-10-CM | POA: Diagnosis not present

## 2021-05-19 DIAGNOSIS — M25572 Pain in left ankle and joints of left foot: Secondary | ICD-10-CM

## 2021-05-19 MED ORDER — DICLOFENAC SODIUM 75 MG PO TBEC: 75.0000 mg | DELAYED_RELEASE_TABLET | Freq: Two times a day (BID) | ORAL | 0 refills | Status: DC

## 2021-05-19 NOTE — Patient Instructions (Signed)
Topical OTC Voltaren

## 2021-05-19 NOTE — Progress Notes (Signed)
Subjective: Nicole Mata is a 60 y.o. female patient who presents to office for evaluation of right greater than left foot/ankle pain. Patient complains of progressive pain especially over the last several weeks that is off and on [place] right foot down and ever since this happened her second toe is most painful.  Patient reports that she has a history of neuropathy and [place] and admits that last year she had an issue where her dog leash hit her left ankle and ever since then her left ankle sometimes feel sore with direct touch to the bone.  Patient also admits to some heel pain every morning gets out of bed hobbling states that this has been going on for a very long time and has not tried any treatments for her heel pain.  Patient is diabetic and reports that her A1c is elevated and she is working on trying to get this better.  Patient Active Problem List   Diagnosis Date Noted   Fall from slipping on ice 12/24/2020   COVID-19 vaccination declined 11/22/2020   Kidney stones 07/01/2019   Lumbar spondylosis 06/21/2017   Diabetic peripheral neuropathy ([place]) 10/04/2016   Hyperlipidemia 06/18/2015   Chondromalacia patellae 12/15/2014   History of kidney stones 12/15/2014   Controlled diabetes mellitus type 2 with complications ([place]) 50/93/2671   Menopausal symptom 24/58/0998   Umbilical hernia 33/82/5053   Small bowel disease 10/14/2012   Disorder of intestine 10/14/2012   Obese 02/06/2012   Cervical radiculopathy at C7 01/16/2011   Brachial neuritis 01/16/2011   Generalized anxiety disorder 04/15/2010   Stiffness of joint, not elsewhere classified, other specified site 01/25/2010   Allergic rhinitis 01/25/2010   Vitamin D deficiency 10/12/2009   Other B-complex deficiencies 10/12/2009   Pain in joints 10/07/2009   Rosacea 08/23/2009   Anxiety state 08/23/2009   Low back pain 04/22/2009   GERD  12/17/2008   Diaphragmatic hernia 12/17/2008   IRRITABLE BOWEL SYNDROME 97/67/3419   HELICOBACTER PYLORI GASTRITIS, HX OF 12/17/2008    Current Outpatient Medications on File Prior to Visit  Medication Sig Dispense Refill   CONTOUR TEST test strip FOR CHECKING BLOOD SUGAR ONCE DAILY 100 strip 12   DEXILANT 60 MG capsule Take 1 capsule (60 mg total) by mouth daily before breakfast. 30 capsule 10   dicyclomine (BENTYL) 10 MG capsule Take 1 capsule (10 mg total) by mouth 2 (two) times daily. 60 capsule 6   fluticasone (FLONASE) 50 MCG/ACT nasal spray Place 2 sprays into the nose daily. 16 g 2   gabapentin (NEURONTIN) 100 MG capsule Take 1 capsule (100 mg total) by mouth at bedtime. 90 capsule 1   hydrocortisone (ANUSOL-HC) 25 MG suppository Use 1 suppository at night time for 5 to 7 days as needed for hemorrhoidal symptoms 12 suppository 6   insulin degludec (TRESIBA FLEXTOUCH) 100 UNIT/ML FlexTouch Pen Inject 30-40 Units into the skin daily. 9 mL 2   Insulin Pen Needle (B-D ULTRAFINE III SHORT PEN) 31G X 8 MM MISC INJECT INTO THE SKIN ONCE DAILY AS DIRECTED 100 each PRN   ketotifen (ZADITOR) 0.025 % ophthalmic solution Place 1 drop into both eyes every morning.     Lancets (FREESTYLE) lancets For testing blood sugars up to 2 times daily. DX: E11.9. Please make sure had the dispenser/cartridge. Thank you 100 each 12   Misc Natural Products (APPLE CIDER VINEGAR DIET)  TABS Take 1 tablet by mouth daily. Goli ACV     pravastatin (PRAVACHOL) 20 MG tablet Take 1 tablet (20 mg total) by mouth every other day. At bedtime. 45 tablet 1   SitaGLIPtin-MetFORMIN HCl (JANUMET XR) [phone] MG TB24 Take 1 tablet by mouth daily. 90 tablet 1   No current facility-administered medications on file prior to visit.    Allergies  Allergen Reactions   Penicillins Anaphylaxis   Sulfa Drugs Cross Reactors Anaphylaxis   Contrast Media [Iodinated Diagnostic Agents] Hives   Atorvastatin Other (See Comments)    Muscle  aches   Gabapentin Other (See Comments)    GERD   Januvia [Sitagliptin] Other (See Comments)    Hot flashes and flushed and dizzy   Morphine And Related Nausea And Vomiting    Pt chooses not to take it   Prednisone     Other reaction(s): Confusion   Azithromycin Other (See Comments)    Other reaction(s): Other Makes her feel really crazy. Makes her feel really crazy.   Hydrocodone Nausea And Vomiting   Invokana [Canagliflozin] Rash    Rash starting on her face    Objective:  General: Alert and oriented x3 in no acute distress  Dermatology: No open lesions bilateral lower extremities, no webspace macerations, no ecchymosis bilateral, all nails x 10 are well manicured.  Vascular: Dorsalis Pedis and Posterior Tibial pedal pulses palpable, Capillary Fill Time 3 seconds,(+) pedal hair growth bilateral, no edema bilateral lower extremities, Temperature gradient within normal limits.  No obvious ecchymosis noted bilateral.  Neurology: Gross sensation intact via light touch bilateral, vibratory sensation slightly diminished but no increase in pain with application bilateral.  Musculoskeletal: Mild tenderness with palpation at right plantar forefoot and to the dorsal distal right second toe, there is also pain to the plantar fascial insertion right greater than left heel and mild pain to direct touch to the fibula of the lateral ankle on the left..   Gait: Antalgic gait  Xrays  Right foot and left ankle   Impression: No acute fracture noted, there is mild joint space narrowing bilateral consistent with arthritis as well as significant posterior and plantar heel spur deformity noted  Assessment and Plan: Problem List Items Addressed This Visit   None Visit Diagnoses     Pain in both feet    -  Primary   Sprain of right foot, initial encounter       Contusion of lesser toe of right foot without damage to nail, initial encounter       Heel pain, chronic, right       Relevant  Medications   diclofenac (VOLTAREN) 75 MG EC tablet   Plantar fasciitis of right foot       Chronic pain of left ankle       Relevant Medications   diclofenac (VOLTAREN) 75 MG EC tablet   Contusion of left ankle, initial encounter            -Complete examination performed -Xrays reviewed -Discussed treatement options -Patient declines steroid injection at this time -Rx diclofenac for patient to take as directed -Advised patient to also get over-the-counter topical Voltaren to use to areas that are painful -Encouraged rest ice elevation and good supportive shoes -Dispensed heel lifts for patient to use as directed -Patient to return to office 3 to 4 weeks or sooner if condition worsens.  Landis Martins, DPM

## 2021-05-20 DIAGNOSIS — E119 Type 2 diabetes mellitus without complications: Secondary | ICD-10-CM | POA: Diagnosis not present

## 2021-05-20 LAB — HM DIABETES EYE EXAM

## 2021-06-02 ENCOUNTER — Other Ambulatory Visit: Payer: Self-pay | Admitting: Sports Medicine

## 2021-06-02 NOTE — Telephone Encounter (Signed)
Please advise 

## 2021-06-14 ENCOUNTER — Other Ambulatory Visit: Payer: Self-pay | Admitting: Sports Medicine

## 2021-06-14 NOTE — Telephone Encounter (Signed)
Please advise 

## 2021-06-16 ENCOUNTER — Other Ambulatory Visit: Payer: Self-pay

## 2021-06-16 ENCOUNTER — Other Ambulatory Visit: Payer: Self-pay | Admitting: Family Medicine

## 2021-06-16 ENCOUNTER — Ambulatory Visit: Payer: BC Managed Care – PPO | Admitting: Sports Medicine

## 2021-06-16 ENCOUNTER — Encounter: Payer: Self-pay | Admitting: Sports Medicine

## 2021-06-16 DIAGNOSIS — M722 Plantar fascial fibromatosis: Secondary | ICD-10-CM | POA: Diagnosis not present

## 2021-06-16 DIAGNOSIS — E119 Type 2 diabetes mellitus without complications: Secondary | ICD-10-CM

## 2021-06-16 DIAGNOSIS — Z1231 Encounter for screening mammogram for malignant neoplasm of breast: Secondary | ICD-10-CM

## 2021-06-16 DIAGNOSIS — M79671 Pain in right foot: Secondary | ICD-10-CM

## 2021-06-16 DIAGNOSIS — M779 Enthesopathy, unspecified: Secondary | ICD-10-CM

## 2021-06-16 DIAGNOSIS — M25571 Pain in right ankle and joints of right foot: Secondary | ICD-10-CM

## 2021-06-16 DIAGNOSIS — M79672 Pain in left foot: Secondary | ICD-10-CM

## 2021-06-16 NOTE — Progress Notes (Signed)
Subjective: Nicole Mata is a 60 y.o. female returns office for follow-up evaluation of bilateral foot pain.  Patient reports that her pain has improved to very minimal since last visit states that the diclofenac medication has helped.  Patient denies any other pedal complaints at this time.  Fasting blood sugar not recorded.  Patient Active Problem List   Diagnosis Date Noted   Fall from slipping on ice 12/24/2020   COVID-19 vaccination declined 11/22/2020   Kidney stones 07/01/2019   Lumbar spondylosis 06/21/2017   Diabetic peripheral neuropathy ([place]) 10/04/2016   Hyperlipidemia 06/18/2015   Chondromalacia patellae 12/15/2014   History of kidney stones 12/15/2014   Controlled diabetes mellitus type 2 with complications ([place]) 09/38/1829   Menopausal symptom 93/71/6967   Umbilical hernia 89/38/1017   Small bowel disease 10/14/2012   Disorder of intestine 10/14/2012   Obese 02/06/2012   Cervical radiculopathy at C7 01/16/2011   Brachial neuritis 01/16/2011   Generalized anxiety disorder 04/15/2010   Stiffness of joint, not elsewhere classified, other specified site 01/25/2010   Allergic rhinitis 01/25/2010   Vitamin D deficiency 10/12/2009   Other B-complex deficiencies 10/12/2009   Pain in joints 10/07/2009   Rosacea 08/23/2009   Anxiety state 08/23/2009   Low back pain 04/22/2009   GERD 12/17/2008   Diaphragmatic hernia 12/17/2008   IRRITABLE BOWEL SYNDROME 51/01/5851   HELICOBACTER PYLORI GASTRITIS, HX OF 12/17/2008    Current Outpatient Medications on File Prior to Visit  Medication Sig Dispense Refill   CONTOUR TEST test strip FOR CHECKING BLOOD SUGAR ONCE DAILY 100 strip 12   DEXILANT 60 MG capsule Take 1 capsule (60 mg total) by mouth daily before breakfast. 30 capsule 10   diclofenac (VOLTAREN) 75 MG EC tablet TAKE 1 TABLET(75 MG) BY MOUTH TWICE DAILY 30 tablet 0   dicyclomine (BENTYL) 10 MG capsule Take 1 capsule (10 mg total) by mouth 2 (two) times daily. 60  capsule 6   fluticasone (FLONASE) 50 MCG/ACT nasal spray Place 2 sprays into the nose daily. 16 g 2   gabapentin (NEURONTIN) 100 MG capsule Take 1 capsule (100 mg total) by mouth at bedtime. 90 capsule 1   hydrocortisone (ANUSOL-HC) 25 MG suppository Use 1 suppository at night time for 5 to 7 days as needed for hemorrhoidal symptoms 12 suppository 6   insulin degludec (TRESIBA FLEXTOUCH) 100 UNIT/ML FlexTouch Pen Inject 30-40 Units into the skin daily. 9 mL 2   Insulin Pen Needle (B-D ULTRAFINE III SHORT PEN) 31G X 8 MM MISC INJECT INTO THE SKIN ONCE DAILY AS DIRECTED 100 each PRN   ketotifen (ZADITOR) 0.025 % ophthalmic solution Place 1 drop into both eyes every morning.     Lancets (FREESTYLE) lancets For testing blood sugars up to 2 times daily. DX: E11.9. Please make sure had the dispenser/cartridge. Thank you 100 each 12   Misc Natural Products (APPLE CIDER VINEGAR DIET) TABS Take 1 tablet by mouth daily. Goli ACV     pravastatin (PRAVACHOL) 20 MG tablet Take 1 tablet (20 mg total) by mouth every other day. At bedtime. 45 tablet 1   SitaGLIPtin-MetFORMIN HCl (JANUMET XR) [phone] MG TB24 Take 1 tablet by mouth daily. 90 tablet 1   No current facility-administered medications on file prior to visit.    Allergies  Allergen Reactions   Penicillins Anaphylaxis   Sulfa Drugs Cross Reactors Anaphylaxis   Contrast Media [Iodinated Diagnostic Agents] Hives   Atorvastatin Other (See Comments)    Muscle aches   Gabapentin  Other (See Comments)    GERD   Januvia [Sitagliptin] Other (See Comments)    Hot flashes and flushed and dizzy   Morphine And Related Nausea And Vomiting    Pt chooses not to take it   Prednisone     Other reaction(s): Confusion   Azithromycin Other (See Comments)    Other reaction(s): Other Makes her feel really crazy. Makes her feel really crazy.   Hydrocodone Nausea And Vomiting   Invokana [Canagliflozin] Rash    Rash starting on her face    Objective:   General: Alert and oriented x3 in no acute distress  Dermatology: No open lesions bilateral lower extremities, no webspace macerations, no ecchymosis bilateral, all nails x 10 are well manicured.  Neurovascular status: Unchanged from prior.  Musculoskeletal: No significant reproducible tenderness with palpation at right plantar forefoot and to the dorsal distal right second toe, there is also no pain to the plantar fascial insertion right greater than left heel and no current pain with palpation to direct touch to the fibula of the lateral ankle on the left.     Assessment and Plan: Problem List Items Addressed This Visit   None Visit Diagnoses     Pain in both feet    -  Primary   Plantar fasciitis of right foot       Acute right ankle pain       Tendonitis       Diabetes mellitus without complication (HCC)            -Complete examination performed -Since pain is almost completely resolved we will closely monitor -Advised patient to continue with daily stretching to prevent reoccurrence -Continue with diclofenac until completed -Return to office if symptoms fail to continue to improve Landis Martins, DPM

## 2021-06-20 DIAGNOSIS — M50122 Cervical disc disorder at C5-C6 level with radiculopathy: Secondary | ICD-10-CM | POA: Diagnosis not present

## 2021-06-20 DIAGNOSIS — M791 Myalgia, unspecified site: Secondary | ICD-10-CM | POA: Diagnosis not present

## 2021-06-21 ENCOUNTER — Other Ambulatory Visit: Payer: Self-pay

## 2021-06-21 ENCOUNTER — Ambulatory Visit
Admission: RE | Admit: 2021-06-21 | Discharge: 2021-06-21 | Disposition: A | Discharge status: Home / Self Care | Payer: BC Managed Care – PPO | Source: Ambulatory Visit

## 2021-06-21 DIAGNOSIS — Z1231 Encounter for screening mammogram for malignant neoplasm of breast: Secondary | ICD-10-CM | POA: Diagnosis not present

## 2021-07-25 ENCOUNTER — Encounter: Payer: Self-pay | Admitting: Family Medicine

## 2021-07-25 ENCOUNTER — Ambulatory Visit: Payer: BC Managed Care – PPO | Admitting: Family Medicine

## 2021-07-25 ENCOUNTER — Other Ambulatory Visit: Payer: Self-pay

## 2021-07-25 VITALS — BP 117/64 | HR 86 | Temp 98.4°F | Ht 67.0 in | Wt 203.0 lb

## 2021-07-25 DIAGNOSIS — R253 Fasciculation: Secondary | ICD-10-CM

## 2021-07-25 DIAGNOSIS — E118 Type 2 diabetes mellitus with unspecified complications: Secondary | ICD-10-CM

## 2021-07-25 DIAGNOSIS — R5383 Other fatigue: Secondary | ICD-10-CM

## 2021-07-25 DIAGNOSIS — M7918 Myalgia, other site: Secondary | ICD-10-CM

## 2021-07-25 LAB — POCT GLYCOSYLATED HEMOGLOBIN (HGB A1C): Hemoglobin A1C: 7.9 % — AB (ref 4.0–5.6)

## 2021-07-25 NOTE — Assessment & Plan Note (Signed)
A1c down to 7.9 today which is great progress.  She is also been walking 15 to 20 minutes daily at work.  It sounds like she has been getting some great blood sugars recently so organ to stick with the 12 units of Tresiba for now continue to hold the glipizide she may end up getting an injection in her neck in the next couple of weeks so just encouraged her to feel free to increase her insulin to 14 units daily or higher if needed.

## 2021-07-25 NOTE — Progress Notes (Signed)
Established Patient Office Visit  Subjective:  Patient ID: Nicole Mata, female    DOB: 01/25/1961  Age: 60 y.o. MRN: [id number]  CC:  Chief Complaint  Patient presents with   Diabetes    HPI Nicole Mata presents for   Diabetes - no hypoglycemic events. No wounds or sores that are not healing well. No increased thirst or urination. Checking glucose at home. Taking medications as prescribed without any side effects. Last A1C pf 8/5.  Does need a refill on needles today.  She says most of her blood sugars have been running under 130 lately.  No lows.  She is not having problems kidney stones since I last saw her but they are watching a cyst on her kidney and she does still have some stones up in the kidney.  Did want to note that she has been getting some occasional cramping in the buttock area.  No known trigger or injury.  Sometimes it will radiate down into the posterior thigh.  She also reports that she sometimes will get some eye twitching in that left eye as well as some pressure on the left side of her head and some numbness on the scalp area just above her left ear.  He also reports that she just feels really tired during the workweek sleep quality has not been great but she wonders if her iron could be low again she does have a history of iron deficiency.  Past Medical History:  Diagnosis Date   Ankle fracture, right 04/12/13   Diabetes mellitus without complication (HCC)    Diverticulosis    Gastropathy    reactive   GERD (gastroesophageal reflux disease)    Helicobacter pylori gastritis    Hiatal hernia    IBS (irritable bowel syndrome)    Ileitis    PONV (postoperative nausea and vomiting)     Past Surgical History:  Procedure Laterality Date   CHOLECYSTECTOMY  2001   LAPAROSCOPY  11/19/2012   Procedure: LAPAROSCOPY DIAGNOSTIC;  Surgeon: Joyice Faster. Cornett, MD;  Location: [facility];  Service: General;  Laterality: N/A;  Diagnostic laparoscopy   SHOULDER SURGERY   2002, 2003   bilateral for bone spurs and torn RC   UMBILICAL HERNIA REPAIR  11/19/2012   Procedure: LAPAROSCOPIC UMBILICAL HERNIA;  Surgeon: Joyice Faster. Cornett, MD;  Location: [facility];  Service: General;  Laterality: N/A;  ventral hernia repair with mesh    Family History  Problem Relation Age of Onset   Heart disease Other        family disease   Heart attack Father 8       grandparents   Hypertension Father    Hyperlipidemia Father    Diabetes Maternal Grandmother    Diabetes Maternal Uncle    Ovarian cancer Maternal Aunt        x 2   Hypertension Mother    Hyperlipidemia Mother    Diabetes type II Mother    Diabetes Brother    Ovarian cancer Maternal Aunt    Colon cancer Neg Hx     Social History   Socioeconomic History   Marital status: Married    Spouse name: Not on file   Number of children: 0   Years of education: Not on file   Highest education level: Not on file  Occupational History   Occupation: IRA specialist  Tobacco Use   Smoking status: Never   Smokeless tobacco: Never  Vaping Use   Vaping Use: Never  used  Substance and Sexual Activity   Alcohol use: Yes    Comment: very rarely   Drug use: No   Sexual activity: Yes    Partners: Male    Comment: divorced, no kids, customer service, 2 caffeine drinks daily.  Other Topics Concern   Not on file  Social History Narrative   1 can of soda a day.  No regular exercise.    Social Determinants of Health   Financial Resource Strain: Not on file  Food Insecurity: Not on file  Transportation Needs: Not on file  Physical Activity: Not on file  Stress: Not on file  Social Connections: Not on file  Intimate Partner Violence: Not on file    Outpatient Medications Prior to Visit  Medication Sig Dispense Refill   CONTOUR TEST test strip FOR CHECKING BLOOD SUGAR ONCE DAILY 100 strip 12   DEXILANT 60 MG capsule Take 1 capsule (60 mg total) by mouth daily before breakfast. 30 capsule 10   diclofenac  (VOLTAREN) 75 MG EC tablet TAKE 1 TABLET(75 MG) BY MOUTH TWICE DAILY 30 tablet 0   dicyclomine (BENTYL) 10 MG capsule Take 1 capsule (10 mg total) by mouth 2 (two) times daily. 60 capsule 6   fluticasone (FLONASE) 50 MCG/ACT nasal spray Place 2 sprays into the nose daily. 16 g 2   gabapentin (NEURONTIN) 100 MG capsule Take 1 capsule (100 mg total) by mouth at bedtime. 90 capsule 1   hydrocortisone (ANUSOL-HC) 25 MG suppository Use 1 suppository at night time for 5 to 7 days as needed for hemorrhoidal symptoms 12 suppository 6   insulin degludec (TRESIBA FLEXTOUCH) 100 UNIT/ML FlexTouch Pen Inject 30-40 Units into the skin daily. 9 mL 2   Insulin Pen Needle (B-D ULTRAFINE III SHORT PEN) 31G X 8 MM MISC INJECT INTO THE SKIN ONCE DAILY AS DIRECTED 100 each PRN   ketotifen (ZADITOR) 0.025 % ophthalmic solution Place 1 drop into both eyes every morning.     Lancets (FREESTYLE) lancets For testing blood sugars up to 2 times daily. DX: E11.9. Please make sure had the dispenser/cartridge. Thank you 100 each 12   Misc Natural Products (APPLE CIDER VINEGAR DIET) TABS Take 1 tablet by mouth daily. Goli ACV     pravastatin (PRAVACHOL) 20 MG tablet Take 1 tablet (20 mg total) by mouth every other day. At bedtime. 45 tablet 1   SitaGLIPtin-MetFORMIN HCl (JANUMET XR) [phone] MG TB24 Take 1 tablet by mouth daily. 90 tablet 1   No facility-administered medications prior to visit.    Allergies  Allergen Reactions   Penicillins Anaphylaxis   Sulfa Drugs Cross Reactors Anaphylaxis   Contrast Media [Iodinated Diagnostic Agents] Hives   Atorvastatin Other (See Comments)    Muscle aches   Gabapentin Other (See Comments)    GERD   Januvia [Sitagliptin] Other (See Comments)    Hot flashes and flushed and dizzy   Morphine And Related Nausea And Vomiting    Pt chooses not to take it   Prednisone     Other reaction(s): Confusion   Azithromycin Other (See Comments)    Other reaction(s): Other Makes her feel  really crazy. Makes her feel really crazy.   Hydrocodone Nausea And Vomiting   Invokana [Canagliflozin] Rash    Rash starting on her face    ROS Review of Systems    Objective:    Physical Exam Constitutional:      Appearance: Normal appearance. She is well-developed.  HENT:  Head: Normocephalic and atraumatic.     Right Ear: Tympanic membrane, ear canal and external ear normal.     Left Ear: Tympanic membrane, ear canal and external ear normal.  Cardiovascular:     Rate and Rhythm: Normal rate and regular rhythm.     Heart sounds: Normal heart sounds.  Pulmonary:     Effort: Pulmonary effort is normal.     Breath sounds: Normal breath sounds.  Skin:    General: Skin is warm and dry.  Neurological:     Mental Status: She is alert and oriented to person, place, and time.  Psychiatric:        Behavior: Behavior normal.   BP 117/64   Pulse 86   Temp 98.4 F (36.9 C)   Ht '5\' 7"'$  (1.702 m)   Wt 203 lb (92.1 kg)   LMP 12/04/2013   SpO2 99%   BMI 31.79 kg/m  Wt Readings from Last 3 Encounters:  07/25/21 203 lb (92.1 kg)  04/18/21 202 lb (91.6 kg)  04/04/21 202 lb (91.6 kg)     Health Maintenance Due  Topic Date Due   INFLUENZA VACCINE  07/04/2021    There are no preventive care reminders to display for this patient.  Lab Results  Component Value Date   TSH 1.316 10/13/2013   Lab Results  Component Value Date   WBC 8.7 02/04/2020   HGB 12.5 02/04/2020   HCT 37.2 02/04/2020   MCV 89.6 02/04/2020   PLT 308 02/04/2020   Lab Results  Component Value Date   NA 139 11/22/2020   K 4.0 11/22/2020   CO2 29 11/22/2020   GLUCOSE 146 (H) 11/22/2020   BUN 13 11/22/2020   CREATININE 0.74 11/22/2020   BILITOT 0.5 11/22/2020   ALKPHOS 74 07/18/2017   AST 19 11/22/2020   ALT 16 11/22/2020   PROT 7.2 11/22/2020   ALBUMIN 3.9 07/18/2017   CALCIUM 9.0 11/22/2020   Lab Results  Component Value Date   CHOL 169 11/22/2020   Lab Results  Component Value  Date   HDL 45 (L) 11/22/2020   Lab Results  Component Value Date   LDLCALC 101 (H) 11/22/2020   Lab Results  Component Value Date   TRIG 130 11/22/2020   Lab Results  Component Value Date   CHOLHDL 3.8 11/22/2020   Lab Results  Component Value Date   HGBA1C 7.9 (A) 07/25/2021      Assessment & Plan:   Problem List Items Addressed This Visit       Endocrine   Controlled diabetes mellitus type 2 with complications ([place]) - Primary    A1c down to 7.9 today which is great progress.  She is also been walking 15 to 20 minutes daily at work.  It sounds like she has been getting some great blood sugars recently so organ to stick with the 12 units of Tresiba for now continue to hold the glipizide she may end up getting an injection in her neck in the next couple of weeks so just encouraged her to feel free to increase her insulin to 14 units daily or higher if needed.      Relevant Orders   POCT glycosylated hemoglobin (Hb A1C) (Completed)   CBC   COMPLETE METABOLIC PANEL WITH GFR   TSH   Fe+TIBC+Fer   B12   Other Visit Diagnoses     Fatigue, unspecified type       Relevant Orders   CBC   COMPLETE  METABOLIC PANEL WITH GFR   TSH   Fe+TIBC+Fer   B12   Pain in buttock       Fasciculations           Fatigue-unclear etiology.  We will do some labs just to rule out iron deficiency etc.  Denies consider checking B12 as well.  Pain in buttock area-could be piriformis spasms.  Based on her description discussed doing some home stretches.  She has been walking more.  Handout given with home exercises to do.   Fasciculations - gave reassurance. She is doing better.    No orders of the defined types were placed in this encounter.   Follow-up: Return in about 3 months (around 10/25/2021) for Diabetes follow-up.    Beatrice Lecher, MD

## 2021-07-26 LAB — COMPLETE METABOLIC PANEL WITH GFR
AG Ratio: 1.4 (calc) (ref 1.0–2.5)
ALT: 11 U/L (ref 6–29)
AST: 16 U/L (ref 10–35)
Albumin: 4.1 g/dL (ref 3.6–5.1)
Alkaline phosphatase (APISO): 71 U/L (ref 37–153)
BUN: 14 mg/dL (ref 7–25)
CO2: 31 mmol/L (ref 20–32)
Calcium: 9.2 mg/dL (ref 8.6–10.4)
Chloride: 104 mmol/L (ref 98–110)
Creat: 0.73 mg/dL (ref 0.50–1.03)
Globulin: 3 g/dL (calc) (ref 1.9–3.7)
Glucose, Bld: 160 mg/dL — ABNORMAL HIGH (ref 65–99)
Potassium: 4.7 mmol/L (ref 3.5–5.3)
Sodium: 139 mmol/L (ref 135–146)
Total Bilirubin: 0.5 mg/dL (ref 0.2–1.2)
Total Protein: 7.1 g/dL (ref 6.1–8.1)
eGFR: 95 mL/min/{1.73_m2} (ref >=60)

## 2021-07-26 LAB — IRON,TIBC AND FERRITIN PANEL
%SAT: 30 % (calc) (ref 16–45)
Ferritin: 71 ng/mL (ref 16–232)
Iron: 82 ug/dL (ref 45–160)
TIBC: 276 mcg/dL (calc) (ref 250–450)

## 2021-07-26 LAB — CBC
HCT: 38.9 % (ref 35.0–45.0)
Hemoglobin: 13 g/dL (ref 11.7–15.5)
MCH: 29.7 pg (ref 27.0–33.0)
MCHC: 33.4 g/dL (ref 32.0–36.0)
MCV: 88.8 fL (ref 80.0–100.0)
MPV: 10.7 fL (ref 7.5–12.5)
Platelets: 314 10*3/uL (ref 140–400)
RBC: 4.38 10*6/uL (ref 3.80–5.10)
RDW: 12.6 % (ref 11.0–15.0)
WBC: 7.8 10*3/uL (ref 3.8–10.8)

## 2021-07-26 LAB — VITAMIN B12: Vitamin B-12: 1312 pg/mL — ABNORMAL HIGH (ref 200–1100)

## 2021-07-26 LAB — TSH: TSH: 1.36 mIU/L (ref 0.40–4.50)

## 2021-07-27 ENCOUNTER — Telehealth: Payer: Self-pay

## 2021-07-27 ENCOUNTER — Other Ambulatory Visit: Payer: Self-pay

## 2021-07-27 DIAGNOSIS — E11649 Type 2 diabetes mellitus with hypoglycemia without coma: Secondary | ICD-10-CM

## 2021-07-27 MED ORDER — BD PEN NEEDLE SHORT U/F 31G X 8 MM MISC: 99 refills | Status: AC

## 2021-07-27 NOTE — Progress Notes (Signed)
Hi Marcille, your blood count and metabolic panel look good.  Your thyroid is normal.  Iron stores look good.  Just continue to eat iron rich foods.  Vitamin B12 looks good in fact it is actually little high.  So if you are taking extra B12 okay to decrease how often you are using that.

## 2021-07-27 NOTE — Telephone Encounter (Signed)
Patient aware of results and recommendations from 07/25/21 labs. No further questions or concerns at this time.

## 2021-08-01 DIAGNOSIS — M50122 Cervical disc disorder at C5-C6 level with radiculopathy: Secondary | ICD-10-CM | POA: Diagnosis not present

## 2021-08-01 DIAGNOSIS — M542 Cervicalgia: Secondary | ICD-10-CM | POA: Diagnosis not present

## 2021-08-01 DIAGNOSIS — M791 Myalgia, unspecified site: Secondary | ICD-10-CM | POA: Diagnosis not present

## 2021-09-19 ENCOUNTER — Other Ambulatory Visit: Payer: Self-pay

## 2021-09-19 ENCOUNTER — Emergency Department
Admission: EM | Admit: 2021-09-19 | Discharge: 2021-09-19 | Disposition: A | Discharge status: Home / Self Care | Payer: BC Managed Care – PPO | Source: Home / Self Care

## 2021-09-19 ENCOUNTER — Encounter: Payer: Self-pay | Admitting: Emergency Medicine

## 2021-09-19 DIAGNOSIS — J309 Allergic rhinitis, unspecified: Secondary | ICD-10-CM

## 2021-09-19 DIAGNOSIS — J019 Acute sinusitis, unspecified: Secondary | ICD-10-CM

## 2021-09-19 DIAGNOSIS — B9689 Other specified bacterial agents as the cause of diseases classified elsewhere: Secondary | ICD-10-CM

## 2021-09-19 DIAGNOSIS — R059 Cough, unspecified: Secondary | ICD-10-CM

## 2021-09-19 HISTORY — DX: COVID-19: U07.1

## 2021-09-19 MED ORDER — BENZONATATE 200 MG PO CAPS: 200.0000 mg | ORAL_CAPSULE | Freq: Three times a day (TID) | ORAL | 0 refills | Status: AC | PRN

## 2021-09-19 MED ORDER — DOXYCYCLINE HYCLATE 100 MG PO CAPS
100.0000 mg | ORAL_CAPSULE | Freq: Two times a day (BID) | ORAL | 0 refills | Status: DC
Start: 2021-09-19 — End: 2021-10-06

## 2021-09-19 MED ORDER — FEXOFENADINE HCL 180 MG PO TABS: 180.0000 mg | ORAL_TABLET | Freq: Every day | ORAL | 0 refills | Status: DC

## 2021-09-19 NOTE — ED Triage Notes (Addendum)
Sinus issues since Thursday  Grandchildren visited 2 Sundays ago w/ colds They were negative for COVID & flu Husband is also sick Sore throat since Friday  OTC tylenol severe sinus & nyquil  Denies fever Negative home COVID test on sat & Sunday at home

## 2021-09-19 NOTE — Discharge Instructions (Addendum)
Advised/instructed patient to take medication as directed with food to completion.  Advised patient to take Allegra daily with first dose of antibiotic for 7 of 10 days, then as needed for concurrent postnasal drainage/drip.  Advised patient may use Tessalon Perles daily or as needed for cough.  Encouraged patient increase daily water intake while taking these medications.

## 2021-09-19 NOTE — ED Provider Notes (Signed)
[name]    CSN: [id number] Arrival date & time: 09/19/21  0808      History   Chief Complaint Chief Complaint  Patient presents with   Facial Pain   Sore    HPI Nicole Mata is a 60 y.o. female.   HPI 60 year old female presents with sinus nasal congestion for 3-4 days, reports her husband who we are evaluating this morning is also sick and to sick grandchildren over this past weekend.  Past Medical History:  Diagnosis Date   Ankle fracture, right 04/12/2013   COVID-19    no vaccine   Diabetes mellitus without complication ([place])    Diverticulosis    Gastropathy    reactive   GERD (gastroesophageal reflux disease)    Helicobacter pylori gastritis    Hiatal hernia    IBS (irritable bowel syndrome)    Ileitis    PONV (postoperative nausea and vomiting)     Patient Active Problem List   Diagnosis Date Noted   Fall from slipping on ice 12/24/2020   COVID-19 vaccination declined 11/22/2020   Kidney stones 07/01/2019   Lumbar spondylosis 06/21/2017   Diabetic peripheral neuropathy ([place]) 10/04/2016   Hyperlipidemia 06/18/2015   Chondromalacia patellae 12/15/2014   History of kidney stones 12/15/2014   Controlled diabetes mellitus type 2 with complications ([place]) 19/41/7408   Menopausal symptom 14/48/1856   Umbilical hernia 31/49/7026   Small bowel disease 10/14/2012   Disorder of intestine 10/14/2012   Obese 02/06/2012   Cervical radiculopathy at C7 01/16/2011   Brachial neuritis 01/16/2011   Generalized anxiety disorder 04/15/2010   Stiffness of joint, not elsewhere classified, other specified site 01/25/2010   Allergic rhinitis 01/25/2010   Vitamin D deficiency 10/12/2009   Other B-complex deficiencies 10/12/2009   Pain in joints 10/07/2009   Rosacea 08/23/2009   Anxiety state 08/23/2009   Low back pain 04/22/2009   GERD 12/17/2008   Diaphragmatic hernia 12/17/2008   IRRITABLE BOWEL SYNDROME 37/85/8850   HELICOBACTER PYLORI GASTRITIS,  HX OF 12/17/2008    Past Surgical History:  Procedure Laterality Date   CHOLECYSTECTOMY  2001   LAPAROSCOPY  11/19/2012   Procedure: LAPAROSCOPY DIAGNOSTIC;  Surgeon: Joyice Faster. Cornett, MD;  Location: [facility];  Service: General;  Laterality: N/A;  Diagnostic laparoscopy   SHOULDER SURGERY  2002, 2003   bilateral for bone spurs and torn RC   UMBILICAL HERNIA REPAIR  11/19/2012   Procedure: LAPAROSCOPIC UMBILICAL HERNIA;  Surgeon: Joyice Faster. Cornett, MD;  Location: [facility];  Service: General;  Laterality: N/A;  ventral hernia repair with mesh    OB History     Gravida  1   Para      Term      Preterm      AB  1   Living         SAB  1   IAB      Ectopic      Multiple      Live Births               Home Medications    Prior to Admission medications   Medication Sig Start Date End Date Taking? Authorizing Provider  benzonatate (TESSALON) 200 MG capsule Take 1 capsule (200 mg total) by mouth 3 (three) times daily as needed for up to 7 days for cough. 09/19/21 09/26/21 Yes Eliezer Lofts, FNP  doxycycline (VIBRAMYCIN) 100 MG capsule Take 1 capsule (100 mg total) by mouth 2 (two) times daily. 09/19/21  Yes Eliezer Lofts, FNP  fexofenadine [facility] ALLERGY) 180 MG tablet Take 1 tablet (180 mg total) by mouth daily for 15 days. 09/19/21 10/04/21 Yes Eliezer Lofts, FNP  CONTOUR TEST test strip FOR CHECKING BLOOD SUGAR ONCE DAILY 03/01/20   Hali Marry, MD  DEXILANT 60 MG capsule Take 1 capsule (60 mg total) by mouth daily before breakfast. 02/21/21   Hali Marry, MD  diclofenac (VOLTAREN) 75 MG EC tablet TAKE 1 TABLET(75 MG) BY MOUTH TWICE DAILY 06/14/21   Landis Martins, DPM  dicyclomine (BENTYL) 10 MG capsule Take 1 capsule (10 mg total) by mouth 2 (two) times daily. 01/17/21   Esterwood, Amy S, PA-C  fluticasone (FLONASE) 50 MCG/ACT nasal spray Place 2 sprays into the nose daily. 09/10/13   Breeback, Jade L, PA-C  gabapentin (NEURONTIN) 100 MG capsule  Take 1 capsule (100 mg total) by mouth at bedtime. 01/17/21   Hali Marry, MD  hydrocortisone (ANUSOL-HC) 25 MG suppository Use 1 suppository at night time for 5 to 7 days as needed for hemorrhoidal symptoms 01/17/21   Esterwood, Amy S, PA-C  insulin degludec (TRESIBA FLEXTOUCH) 100 UNIT/ML FlexTouch Pen Inject 30-40 Units into the skin daily. 04/18/21   Hali Marry, MD  Insulin Pen Needle (B-D ULTRAFINE III SHORT PEN) 31G X 8 MM MISC INJECT INTO THE SKIN ONCE DAILY AS DIRECTED 07/27/21   Hali Marry, MD  ketotifen (ZADITOR) 0.025 % ophthalmic solution Place 1 drop into both eyes every morning.    [provider]  Lancets (FREESTYLE) lancets For testing blood sugars up to 2 times daily. DX: E11.9. Please make sure had the dispenser/cartridge. Thank you 02/02/20   Hali Marry, MD  Misc Natural Products (APPLE CIDER VINEGAR DIET) TABS Take 1 tablet by mouth daily. [place] ACV    [provider]  pravastatin (PRAVACHOL) 20 MG tablet Take 1 tablet (20 mg total) by mouth every other day. At bedtime. 11/25/20   Hali Marry, MD  SitaGLIPtin-MetFORMIN HCl (JANUMET XR) [phone] MG TB24 Take 1 tablet by mouth daily. 04/18/21   Hali Marry, MD    Family History Family History  Problem Relation Age of Onset   Heart disease Other        family disease   Heart attack Father 41       grandparents   Hypertension Father    Hyperlipidemia Father    Diabetes Maternal Grandmother    Diabetes Maternal Uncle    Ovarian cancer Maternal Aunt        x 2   Hypertension Mother    Hyperlipidemia Mother    Diabetes type II Mother    Diabetes Brother    Ovarian cancer Maternal Aunt    Colon cancer Neg Hx     Social History Social History   Tobacco Use   Smoking status: Never   Smokeless tobacco: Never  Vaping Use   Vaping Use: Never used  Substance Use Topics   Alcohol use: Yes    Comment: very rarely   Drug use: No     Allergies    Penicillins, Sulfa drugs cross reactors, Contrast media [iodinated diagnostic agents], Atorvastatin, Gabapentin, Januvia [sitagliptin], Morphine and related, Prednisone, Azithromycin, Hydrocodone, and Invokana [canagliflozin]   Review of Systems Review of Systems  HENT:  Positive for congestion.   Respiratory:  Positive for cough.   All other systems reviewed and are negative.   Physical Exam Triage Vital Signs ED Triage Vitals  Enc Vitals  Group     BP      Pulse      Resp      Temp      Temp src      SpO2      Weight      Height      Head Circumference      Peak Flow      Pain Score      Pain Loc      Pain Edu?      Excl. in [place]?    No data found.  Updated Vital Signs BP 132/85 (BP Location: Left Arm)   Pulse 98   Temp 99.8 F (37.7 C) (Oral)   Resp 16   Ht 5\' 7"  (1.702 m)   Wt 201 lb (91.2 kg)   LMP 12/04/2013   SpO2 96%   BMI 31.48 kg/m       Physical Exam Vitals and nursing note reviewed.  Constitutional:      General: She is not in acute distress.    Appearance: Normal appearance. She is obese. She is not ill-appearing.  HENT:     Head: Normocephalic and atraumatic.     Right Ear: Tympanic membrane, ear canal and external ear normal.     Left Ear: Tympanic membrane, ear canal and external ear normal.     Mouth/Throat:     Mouth: Mucous membranes are moist.     Pharynx: Oropharynx is clear.     Comments: Moderate amount of clear drainage of posterior oropharynx noted Eyes:     Extraocular Movements: Extraocular movements intact.     Conjunctiva/sclera: Conjunctivae normal.     Pupils: Pupils are equal, round, and reactive to light.  Cardiovascular:     Rate and Rhythm: Normal rate and regular rhythm.     Pulses: Normal pulses.     Heart sounds: Normal heart sounds.  Pulmonary:     Effort: Pulmonary effort is normal.     Breath sounds: Normal breath sounds. No wheezing, rhonchi or rales.  Musculoskeletal:        General: Normal range of  motion.     Cervical back: Normal range of motion and neck supple. No tenderness.  Lymphadenopathy:     Cervical: No cervical adenopathy.  Skin:    General: Skin is warm and dry.  Neurological:     General: No focal deficit present.     Mental Status: She is alert and oriented to person, place, and time. Mental status is at baseline.  Psychiatric:        Mood and Affect: Mood normal.        Behavior: Behavior normal.        Thought Content: Thought content normal.     UC Treatments / Results  Labs (all labs ordered are listed, but only abnormal results are displayed) Labs Reviewed - No data to display  EKG   Radiology No results found.  Procedures Procedures (including critical care time)  Medications Ordered in UC Medications - No data to display  Initial Impression / Assessment and Plan / UC Course  I have reviewed the triage vital signs and the nursing notes.  Pertinent labs & imaging results that were available during my care of the patient were reviewed by me and considered in my medical decision making (see chart for details).     MDM: 1.  Acute bacterial rhinosinusitis-Rx Doxycycline; 2.  Cough -Rx'd Tessalon Perles; 3.  Allergic rhinitis-text Allegra.  Advised/instructed patient  to take medication as directed with food to completion.  Advised patient to take Allegra daily with first dose of antibiotic for 7 of 10 days, then as needed for concurrent postnasal drainage/drip.  Advised patient may use Tessalon Perles daily or as needed for cough.  Encouraged patient increase daily water intake while taking these medications.  Patient discharged home, hemodynamically stable. Final Clinical Impressions(s) / UC Diagnoses   Final diagnoses:  Acute bacterial rhinosinusitis  Cough, unspecified type  Allergic rhinitis, unspecified seasonality, unspecified trigger     Discharge Instructions      Advised/instructed patient to take medication as directed with food to  completion.  Advised patient to take Allegra daily with first dose of antibiotic for 7 of 10 days, then as needed for concurrent postnasal drainage/drip.  Advised patient may use Tessalon Perles daily or as needed for cough.  Encouraged patient increase daily water intake while taking these medications.     ED Prescriptions     Medication Sig Dispense Auth. Provider   doxycycline (VIBRAMYCIN) 100 MG capsule Take 1 capsule (100 mg total) by mouth 2 (two) times daily. 20 capsule Eliezer Lofts, FNP   fexofenadine [facility] ALLERGY) 180 MG tablet Take 1 tablet (180 mg total) by mouth daily for 15 days. 15 tablet Eliezer Lofts, FNP   benzonatate (TESSALON) 200 MG capsule Take 1 capsule (200 mg total) by mouth 3 (three) times daily as needed for up to 7 days for cough. 30 capsule Eliezer Lofts, FNP      PDMP not reviewed this encounter.   Eliezer Lofts, [place] 09/19/21 [phone]

## 2021-10-06 ENCOUNTER — Ambulatory Visit: Payer: BC Managed Care – PPO | Admitting: Family Medicine

## 2021-10-06 ENCOUNTER — Encounter: Payer: Self-pay | Admitting: Family Medicine

## 2021-10-06 ENCOUNTER — Other Ambulatory Visit: Payer: Self-pay

## 2021-10-06 VITALS — BP 124/67 | HR 82 | Ht 67.0 in | Wt 205.0 lb

## 2021-10-06 DIAGNOSIS — Z23 Encounter for immunization: Secondary | ICD-10-CM

## 2021-10-06 DIAGNOSIS — R058 Other specified cough: Secondary | ICD-10-CM

## 2021-10-06 MED ORDER — PREDNISOLONE SODIUM PHOSPHATE 10 MG PO TBDP: 30.0000 mg | ORAL_TABLET | Freq: Every day | ORAL | 0 refills | Status: AC

## 2021-10-06 NOTE — Progress Notes (Signed)
Acute Office Visit  Subjective:    Patient ID: Nicole Mata, female    DOB: 09-20-1961, 60 y.o.   MRN: [id number]  Chief Complaint  Patient presents with   Cough    HPI Patient is in today for cough x 3 weeks.  Was seen at [facility] on 10/17 and given doxycycline. Not as bad as it was. Using Flonase and tylenol sinus.  Says worse in AM.  No fever, chills or sweat.  No shortness of breath.  No wheezing.  She has a lot of fullness and pressure in both of her ears.  She still has a lot of mucus production.  Past Medical History:  Diagnosis Date   Ankle fracture, right 04/12/2013   COVID-19    no vaccine   Diabetes mellitus without complication ([facility])    Diverticulosis    Gastropathy    reactive   GERD (gastroesophageal reflux disease)    Helicobacter pylori gastritis    Hiatal hernia    IBS (irritable bowel syndrome)    Ileitis    PONV (postoperative nausea and vomiting)     Past Surgical History:  Procedure Laterality Date   CHOLECYSTECTOMY  2001   LAPAROSCOPY  11/19/2012   Procedure: LAPAROSCOPY DIAGNOSTIC;  Surgeon: Joyice Faster. Cornett, MD;  Location: [facility];  Service: General;  Laterality: N/A;  Diagnostic laparoscopy   SHOULDER SURGERY  2002, 2003   bilateral for bone spurs and torn RC   UMBILICAL HERNIA REPAIR  11/19/2012   Procedure: LAPAROSCOPIC UMBILICAL HERNIA;  Surgeon: Joyice Faster. Cornett, MD;  Location: [facility];  Service: General;  Laterality: N/A;  ventral hernia repair with mesh    Family History  Problem Relation Age of Onset   Heart disease Other        family disease   Heart attack Father 35       grandparents   Hypertension Father    Hyperlipidemia Father    Diabetes Maternal Grandmother    Diabetes Maternal Uncle    Ovarian cancer Maternal Aunt        x 2   Hypertension Mother    Hyperlipidemia Mother    Diabetes type II Mother    Diabetes Brother    Ovarian cancer Maternal Aunt    Colon cancer Neg Hx     Social History   Socioeconomic History    Marital status: Married    Spouse name: Not on file   Number of children: 0   Years of education: Not on file   Highest education level: Not on file  Occupational History   Occupation: IRA specialist  Tobacco Use   Smoking status: Never   Smokeless tobacco: Never  Vaping Use   Vaping Use: Never used  Substance and Sexual Activity   Alcohol use: Yes    Comment: very rarely   Drug use: No   Sexual activity: Yes    Partners: Male    Comment: divorced, no kids, customer service, 2 caffeine drinks daily.  Other Topics Concern   Not on file  Social History Narrative   1 can of soda a day.  No regular exercise.    Social Determinants of Health   Financial Resource Strain: Not on file  Food Insecurity: Not on file  Transportation Needs: Not on file  Physical Activity: Not on file  Stress: Not on file  Social Connections: Not on file  Intimate Partner Violence: Not on file    Outpatient Medications Prior to Visit  Medication  Sig Dispense Refill   CONTOUR TEST test strip FOR CHECKING BLOOD SUGAR ONCE DAILY 100 strip 12   DEXILANT 60 MG capsule Take 1 capsule (60 mg total) by mouth daily before breakfast. 30 capsule 10   diclofenac (VOLTAREN) 75 MG EC tablet TAKE 1 TABLET(75 MG) BY MOUTH TWICE DAILY 30 tablet 0   dicyclomine (BENTYL) 10 MG capsule Take 1 capsule (10 mg total) by mouth 2 (two) times daily. 60 capsule 6   fluticasone (FLONASE) 50 MCG/ACT nasal spray Place 2 sprays into the nose daily. 16 g 2   gabapentin (NEURONTIN) 100 MG capsule Take 1 capsule (100 mg total) by mouth at bedtime. 90 capsule 1   hydrocortisone (ANUSOL-HC) 25 MG suppository Use 1 suppository at night time for 5 to 7 days as needed for hemorrhoidal symptoms 12 suppository 6   insulin degludec (TRESIBA FLEXTOUCH) 100 UNIT/ML FlexTouch Pen Inject 30-40 Units into the skin daily. 9 mL 2   Insulin Pen Needle (B-D ULTRAFINE III SHORT PEN) 31G X 8 MM MISC INJECT INTO THE SKIN ONCE DAILY AS DIRECTED 100 each  PRN   ketotifen (ZADITOR) 0.025 % ophthalmic solution Place 1 drop into both eyes every morning.     Lancets (FREESTYLE) lancets For testing blood sugars up to 2 times daily. DX: E11.9. Please make sure had the dispenser/cartridge. Thank you 100 each 12   Misc Natural Products (APPLE CIDER VINEGAR DIET) TABS Take 1 tablet by mouth daily. [place] ACV     pravastatin (PRAVACHOL) 20 MG tablet Take 1 tablet (20 mg total) by mouth every other day. At bedtime. 45 tablet 1   SitaGLIPtin-MetFORMIN HCl (JANUMET XR) [phone] MG TB24 Take 1 tablet by mouth daily. 90 tablet 1   doxycycline (VIBRAMYCIN) 100 MG capsule Take 1 capsule (100 mg total) by mouth 2 (two) times daily. 20 capsule 0   fexofenadine (ALLEGRA ALLERGY) 180 MG tablet Take 1 tablet (180 mg total) by mouth daily for 15 days. 15 tablet 0   No facility-administered medications prior to visit.    Allergies  Allergen Reactions   Penicillins Anaphylaxis   Sulfa Drugs Cross Reactors Anaphylaxis   Contrast Media [Iodinated Diagnostic Agents] Hives   Atorvastatin Other (See Comments)    Muscle aches   Gabapentin Other (See Comments)    GERD   Januvia [Sitagliptin] Other (See Comments)    Hot flashes and flushed and dizzy   Morphine And Related Nausea And Vomiting    Pt chooses not to take it   Prednisone     Other reaction(s): Confusion   Azithromycin Other (See Comments)    Other reaction(s): Other Makes her feel really crazy. Makes her feel really crazy.   Hydrocodone Nausea And Vomiting   Invokana [Canagliflozin] Rash    Rash starting on her face    Review of Systems     Objective:    Physical Exam Constitutional:      Appearance: She is well-developed.  HENT:     Head: Normocephalic and atraumatic.     Right Ear: External ear normal.     Left Ear: External ear normal.     Nose: Nose normal.  Eyes:     Conjunctiva/sclera: Conjunctivae normal.     Pupils: Pupils are equal, round, and reactive to light.  Neck:      Thyroid: No thyromegaly.  Cardiovascular:     Rate and Rhythm: Normal rate and regular rhythm.     Heart sounds: Normal heart sounds.  Pulmonary:  Effort: Pulmonary effort is normal.     Breath sounds: Normal breath sounds. No wheezing.  Musculoskeletal:     Cervical back: Neck supple.  Lymphadenopathy:     Cervical: No cervical adenopathy.  Skin:    General: Skin is warm and dry.  Neurological:     Mental Status: She is alert and oriented to person, place, and time.    BP 124/67   Pulse 82   Ht _0  (1.702 m)   Wt 205 lb (93 kg)   LMP 12/04/2013   SpO2 100%   BMI 32.11 kg/m  Wt Readings from Last 3 Encounters:  10/06/21 205 lb (93 kg)  09/19/21 201 lb (91.2 kg)  07/25/21 203 lb (92.1 kg)    There are no preventive care reminders to display for this patient.   There are no preventive care reminders to display for this patient.   Lab Results  Component Value Date   TSH 1.36 07/25/2021   Lab Results  Component Value Date   WBC 7.8 07/25/2021   HGB 13.0 07/25/2021   HCT 38.9 07/25/2021   MCV 88.8 07/25/2021   PLT 314 07/25/2021   Lab Results  Component Value Date   NA 139 07/25/2021   K 4.7 07/25/2021   CO2 31 07/25/2021   GLUCOSE 160 (H) 07/25/2021   BUN 14 07/25/2021   CREATININE 0.73 07/25/2021   BILITOT 0.5 07/25/2021   ALKPHOS 74 07/18/2017   AST 16 07/25/2021   ALT 11 07/25/2021   PROT 7.1 07/25/2021   ALBUMIN 3.9 07/18/2017   CALCIUM 9.2 07/25/2021   EGFR 95 07/25/2021   Lab Results  Component Value Date   CHOL 169 11/22/2020   Lab Results  Component Value Date   HDL 45 (L) 11/22/2020   Lab Results  Component Value Date   LDLCALC 101 (H) 11/22/2020   Lab Results  Component Value Date   TRIG 130 11/22/2020   Lab Results  Component Value Date   CHOLHDL 3.8 11/22/2020   Lab Results  Component Value Date   HGBA1C 7.9 (A) 07/25/2021       Assessment & Plan:   Problem List Items Addressed This Visit   None Visit  Diagnoses     Need for immunization against influenza    -  Primary   Relevant Orders   Flu Vaccine QUAD 68moIM (Fluarix, Fluzone & Alfiuria Quad PF) (Completed)   Post-viral cough syndrome          Postinfectious cough-I suspect that this is slowly improving.  But she is having a lot of pressure and fullness in her head and ears so we will go ahead and treat with prednisolone to see if we can Get her through this.  Recommend Robitussin for cough over-the-counter.  If not improving in the next week or 2 please let u[place].  Meds ordered this encounter  Medications   prednisoLONE (ORAPRED ODT) 10 MG disintegrating tablet    Sig: Take 3 tablets (30 mg total) by mouth daily for 5 days.    Dispense:  15 tablet    Refill:  0      CBeatrice Lecher MD

## 2021-10-24 ENCOUNTER — Ambulatory Visit: Payer: BC Managed Care – PPO | Admitting: Family Medicine

## 2021-11-01 ENCOUNTER — Other Ambulatory Visit: Payer: Self-pay | Admitting: *Deleted

## 2021-11-01 DIAGNOSIS — E118 Type 2 diabetes mellitus with unspecified complications: Secondary | ICD-10-CM

## 2021-11-01 MED ORDER — JANUMET XR 100-1000 MG PO TB24: 1.0000 | ORAL_TABLET | Freq: Every day | ORAL | 1 refills | Status: DC

## 2021-11-21 ENCOUNTER — Other Ambulatory Visit: Payer: Self-pay

## 2021-11-21 ENCOUNTER — Encounter: Payer: Self-pay | Admitting: Family Medicine

## 2021-11-21 ENCOUNTER — Ambulatory Visit: Payer: BC Managed Care – PPO | Admitting: Family Medicine

## 2021-11-21 VITALS — BP 135/67 | HR 78 | Ht 67.0 in | Wt 204.0 lb

## 2021-11-21 DIAGNOSIS — E118 Type 2 diabetes mellitus with unspecified complications: Secondary | ICD-10-CM | POA: Diagnosis not present

## 2021-11-21 DIAGNOSIS — E7800 Pure hypercholesterolemia, unspecified: Secondary | ICD-10-CM | POA: Diagnosis not present

## 2021-11-21 LAB — POCT GLYCOSYLATED HEMOGLOBIN (HGB A1C): Hemoglobin A1C: 8.5 % — AB (ref 4.0–5.6)

## 2021-11-21 MED ORDER — PRAVASTATIN SODIUM 20 MG PO TABS: 20.0000 mg | ORAL_TABLET | ORAL | 1 refills | Status: DC

## 2021-11-21 MED ORDER — TIRZEPATIDE 2.5 MG/0.5ML ~~LOC~~ SOAJ: 2.5000 mg | SUBCUTANEOUS | 1 refills | Status: DC

## 2021-11-21 NOTE — Assessment & Plan Note (Signed)
Due to recheck lipids.  She is currently on pravastatin and will need refills.  I like to get her labs back to see if we might need to adjust her dose.

## 2021-11-21 NOTE — Progress Notes (Signed)
Established Patient Office Visit  Subjective:  Patient ID: Nicole Mata, female    DOB: Sep 08, 1961  Age: 60 y.o. MRN: [id number]  CC:  Chief Complaint  Patient presents with   Diabetes   Hypertension    HPI Nicole Mata presents for   Diabetes - no hypoglycemic events. No wounds or sores that are not healing well. No increased thirst or urination. Checking glucose at home. Taking medications as prescribed without any side effects.  She has been noticing some intermittent blood sugars in the 300s even without any significant dietary discretions.   Hyperlipidemia - tolerating stating well with no myalgias or significant side effects.  Lab Results  Component Value Date   CHOL 169 11/22/2020   HDL 45 (L) 11/22/2020   LDLCALC 101 (H) 11/22/2020   TRIG 130 11/22/2020   CHOLHDL 3.8 11/22/2020    Has been under a lot of stress with her husband being hospitalized for 2 weeks if she has been really very stressful last several months and she felt like that was causing her sugar to go up.  Past Medical History:  Diagnosis Date   Ankle fracture, right 04/12/2013   COVID-19    no vaccine   Diabetes mellitus without complication (HCC)    Diverticulosis    Gastropathy    reactive   GERD (gastroesophageal reflux disease)    Helicobacter pylori gastritis    Hiatal hernia    IBS (irritable bowel syndrome)    Ileitis    PONV (postoperative nausea and vomiting)     Past Surgical History:  Procedure Laterality Date   CHOLECYSTECTOMY  2001   LAPAROSCOPY  11/19/2012   Procedure: LAPAROSCOPY DIAGNOSTIC;  Surgeon: Joyice Faster. Cornett, MD;  Location: [facility];  Service: General;  Laterality: N/A;  Diagnostic laparoscopy   SHOULDER SURGERY  2002, 2003   bilateral for bone spurs and torn RC   UMBILICAL HERNIA REPAIR  11/19/2012   Procedure: LAPAROSCOPIC UMBILICAL HERNIA;  Surgeon: Joyice Faster. Cornett, MD;  Location: [facility];  Service: General;  Laterality: N/A;  ventral hernia repair with mesh     Family History  Problem Relation Age of Onset   Heart disease Other        family disease   Heart attack Father 37       grandparents   Hypertension Father    Hyperlipidemia Father    Diabetes Maternal Grandmother    Diabetes Maternal Uncle    Ovarian cancer Maternal Aunt        x 2   Hypertension Mother    Hyperlipidemia Mother    Diabetes type II Mother    Diabetes Brother    Ovarian cancer Maternal Aunt    Colon cancer Neg Hx     Social History   Socioeconomic History   Marital status: Married    Spouse name: Not on file   Number of children: 0   Years of education: Not on file   Highest education level: Not on file  Occupational History   Occupation: IRA specialist  Tobacco Use   Smoking status: Never   Smokeless tobacco: Never  Vaping Use   Vaping Use: Never used  Substance and Sexual Activity   Alcohol use: Yes    Comment: very rarely   Drug use: No   Sexual activity: Yes    Partners: Male    Comment: divorced, no kids, customer service, 2 caffeine drinks daily.  Other Topics Concern   Not on file  Social History Narrative   1 can of soda a day.  No regular exercise.    Social Determinants of Health   Financial Resource Strain: Not on file  Food Insecurity: Not on file  Transportation Needs: Not on file  Physical Activity: Not on file  Stress: Not on file  Social Connections: Not on file  Intimate Partner Violence: Not on file    Outpatient Medications Prior to Visit  Medication Sig Dispense Refill   CONTOUR TEST test strip FOR CHECKING BLOOD SUGAR ONCE DAILY 100 strip 12   DEXILANT 60 MG capsule Take 1 capsule (60 mg total) by mouth daily before breakfast. 30 capsule 10   diclofenac (VOLTAREN) 75 MG EC tablet TAKE 1 TABLET(75 MG) BY MOUTH TWICE DAILY 30 tablet 0   dicyclomine (BENTYL) 10 MG capsule Take 1 capsule (10 mg total) by mouth 2 (two) times daily. 60 capsule 6   fluticasone (FLONASE) 50 MCG/ACT nasal spray Place 2 sprays into the  nose daily. 16 g 2   gabapentin (NEURONTIN) 100 MG capsule Take 1 capsule (100 mg total) by mouth at bedtime. 90 capsule 1   hydrocortisone (ANUSOL-HC) 25 MG suppository Use 1 suppository at night time for 5 to 7 days as needed for hemorrhoidal symptoms 12 suppository 6   insulin degludec (TRESIBA FLEXTOUCH) 100 UNIT/ML FlexTouch Pen Inject 30-40 Units into the skin daily. 9 mL 2   Insulin Pen Needle (B-D ULTRAFINE III SHORT PEN) 31G X 8 MM MISC INJECT INTO THE SKIN ONCE DAILY AS DIRECTED 100 each PRN   ketotifen (ZADITOR) 0.025 % ophthalmic solution Place 1 drop into both eyes every morning.     Lancets (FREESTYLE) lancets For testing blood sugars up to 2 times daily. DX: E11.9. Please make sure had the dispenser/cartridge. Thank you 100 each 12   Misc Natural Products (APPLE CIDER VINEGAR DIET) TABS Take 1 tablet by mouth daily. Goli ACV     SitaGLIPtin-MetFORMIN HCl (JANUMET XR) [phone] MG TB24 Take 1 tablet by mouth daily. 90 tablet 1   pravastatin (PRAVACHOL) 20 MG tablet Take 1 tablet (20 mg total) by mouth every other day. At bedtime. 45 tablet 1   No facility-administered medications prior to visit.    Allergies  Allergen Reactions   Penicillins Anaphylaxis   Sulfa Drugs Cross Reactors Anaphylaxis   Contrast Media [Iodinated Diagnostic Agents] Hives   Atorvastatin Other (See Comments)    Muscle aches   Gabapentin Other (See Comments)    GERD   Januvia [Sitagliptin] Other (See Comments)    Hot flashes and flushed and dizzy   Morphine And Related Nausea And Vomiting    Pt chooses not to take it   Prednisone     Other reaction(s): Confusion   Azithromycin Other (See Comments)    Other reaction(s): Other Makes her feel really crazy. Makes her feel really crazy.   Hydrocodone Nausea And Vomiting   Invokana [Canagliflozin] Rash    Rash starting on her face    ROS Review of Systems    Objective:    Physical Exam Constitutional:      Appearance: Normal appearance. She  is well-developed.  HENT:     Head: Normocephalic and atraumatic.  Cardiovascular:     Rate and Rhythm: Normal rate and regular rhythm.     Heart sounds: Normal heart sounds.  Pulmonary:     Effort: Pulmonary effort is normal.     Breath sounds: Normal breath sounds.  Skin:    General: Skin  is warm and dry.  Neurological:     Mental Status: She is alert and oriented to person, place, and time.  Psychiatric:        Behavior: Behavior normal.    BP 135/67    Pulse 78    Ht _0  (1.702 m)    Wt 204 lb (92.5 kg)    LMP 12/04/2013    SpO2 98%    BMI 31.95 kg/m  Wt Readings from Last 3 Encounters:  11/21/21 204 lb (92.5 kg)  10/06/21 205 lb (93 kg)  09/19/21 201 lb (91.2 kg)     There are no preventive care reminders to display for this patient.   There are no preventive care reminders to display for this patient.  Lab Results  Component Value Date   TSH 1.36 07/25/2021   Lab Results  Component Value Date   WBC 7.8 07/25/2021   HGB 13.0 07/25/2021   HCT 38.9 07/25/2021   MCV 88.8 07/25/2021   PLT 314 07/25/2021   Lab Results  Component Value Date   NA 139 07/25/2021   K 4.7 07/25/2021   CO2 31 07/25/2021   GLUCOSE 160 (H) 07/25/2021   BUN 14 07/25/2021   CREATININE 0.73 07/25/2021   BILITOT 0.5 07/25/2021   ALKPHOS 74 07/18/2017   AST 16 07/25/2021   ALT 11 07/25/2021   PROT 7.1 07/25/2021   ALBUMIN 3.9 07/18/2017   CALCIUM 9.2 07/25/2021   EGFR 95 07/25/2021   Lab Results  Component Value Date   CHOL 169 11/22/2020   Lab Results  Component Value Date   HDL 45 (L) 11/22/2020   Lab Results  Component Value Date   LDLCALC 101 (H) 11/22/2020   Lab Results  Component Value Date   TRIG 130 11/22/2020   Lab Results  Component Value Date   CHOLHDL 3.8 11/22/2020   Lab Results  Component Value Date   HGBA1C 8.5 (A) 11/21/2021      Assessment & Plan:   Problem List Items Addressed This Visit       Endocrine   Controlled diabetes mellitus  type 2 with complications ([place]) - Primary    A1c went up to 8.5.  We discussed putting her on a GLP-1.  We will see if her insurance will cover them and Charo.  New prescription sent to pharmacy if not covered consider Trulicity.  Plan will be to discontinue Januvia component of her medication if she does well with the medication and hopefully taper her insulin.  I did want her to increase her Tresiba by 2 units until her fasting blood sugars are under 130.  And we can taper backwards if needed.  Follow-up in 3 months.      Relevant Medications   pravastatin (PRAVACHOL) 20 MG tablet   tirzepatide (MOUNJARO) 2.5 MG/0.5ML Pen   Other Relevant Orders   POCT glycosylated hemoglobin (Hb A1C) (Completed)   Lipid panel   COMPLETE METABOLIC PANEL WITH GFR   CBC     Other   Hyperlipidemia    Due to recheck lipids.  She is currently on pravastatin and will need refills.  I like to get her labs back to see if we might need to adjust her dose.      Relevant Medications   pravastatin (PRAVACHOL) 20 MG tablet   Other Relevant Orders   Lipid panel   COMPLETE METABOLIC PANEL WITH GFR   CBC    Meds ordered this encounter  Medications  pravastatin (PRAVACHOL) 20 MG tablet    Sig: Take 1 tablet (20 mg total) by mouth every other day. At bedtime.    Dispense:  45 tablet    Refill:  1   tirzepatide (MOUNJARO) 2.5 MG/0.5ML Pen    Sig: Inject 2.5 mg into the skin once a week.    Dispense:  2 mL    Refill:  1    Follow-up: Return in about 3 months (around 02/19/2022) for Diabetes follow-up.    Beatrice Lecher, MD

## 2021-11-21 NOTE — Assessment & Plan Note (Addendum)
A1c went up to 8.5.  We discussed putting her on a GLP-1.  We will see if her insurance will cover them and Charo.  New prescription sent to pharmacy if not covered consider Trulicity.  Plan will be to discontinue Januvia component of her medication if she does well with the medication and hopefully taper her insulin.  I did want her to increase her Tresiba by 2 units until her fasting blood sugars are under 130.  And we can taper backwards if needed.  Follow-up in 3 months.

## 2021-11-22 LAB — LIPID PANEL
Cholesterol: 177 mg/dL (ref <200)
HDL: 48 mg/dL — ABNORMAL LOW (ref >=50)
LDL Cholesterol (Calc): 109 mg/dL (calc) — ABNORMAL HIGH
Non-HDL Cholesterol (Calc): 129 mg/dL (calc) (ref <130)
Total CHOL/HDL Ratio: 3.7 (calc) (ref <5.0)
Triglycerides: 107 mg/dL (ref <150)

## 2021-11-22 LAB — CBC
HCT: 39.3 % (ref 35.0–45.0)
Hemoglobin: 13.4 g/dL (ref 11.7–15.5)
MCH: 29.9 pg (ref 27.0–33.0)
MCHC: 34.1 g/dL (ref 32.0–36.0)
MCV: 87.7 fL (ref 80.0–100.0)
MPV: 11 fL (ref 7.5–12.5)
Platelets: 311 10*3/uL (ref 140–400)
RBC: 4.48 10*6/uL (ref 3.80–5.10)
RDW: 12.4 % (ref 11.0–15.0)
WBC: 7.5 10*3/uL (ref 3.8–10.8)

## 2021-11-22 LAB — COMPLETE METABOLIC PANEL WITH GFR
AG Ratio: 1.3 (calc) (ref 1.0–2.5)
ALT: 17 U/L (ref 6–29)
AST: 19 U/L (ref 10–35)
Albumin: 4 g/dL (ref 3.6–5.1)
Alkaline phosphatase (APISO): 68 U/L (ref 37–153)
BUN: 14 mg/dL (ref 7–25)
CO2: 29 mmol/L (ref 20–32)
Calcium: 10.1 mg/dL (ref 8.6–10.4)
Chloride: 101 mmol/L (ref 98–110)
Creat: 0.73 mg/dL (ref 0.50–1.03)
Globulin: 3.2 g/dL (calc) (ref 1.9–3.7)
Glucose, Bld: 162 mg/dL — ABNORMAL HIGH (ref 65–99)
Potassium: 5.2 mmol/L (ref 3.5–5.3)
Sodium: 137 mmol/L (ref 135–146)
Total Bilirubin: 0.5 mg/dL (ref 0.2–1.2)
Total Protein: 7.2 g/dL (ref 6.1–8.1)
eGFR: 95 mL/min/{1.73_m2} (ref >=60)

## 2021-11-22 NOTE — Progress Notes (Signed)
Hi Tahirih, LDL cholesterol is up just slightly please definitely continue to take your pravastatin.  Think you are still taking 1 every other day if I am not mistaken.  Your metabolic panel otherwise looks okay.  Blood count is normal.  No sign of anemia or infection.

## 2021-12-04 DIAGNOSIS — N200 Calculus of kidney: Secondary | ICD-10-CM

## 2021-12-04 HISTORY — DX: Calculus of kidney: N20.0

## 2021-12-15 DIAGNOSIS — Z7951 Long term (current) use of inhaled steroids: Secondary | ICD-10-CM | POA: Diagnosis not present

## 2021-12-15 DIAGNOSIS — N200 Calculus of kidney: Secondary | ICD-10-CM | POA: Diagnosis not present

## 2021-12-15 DIAGNOSIS — E119 Type 2 diabetes mellitus without complications: Secondary | ICD-10-CM | POA: Diagnosis not present

## 2021-12-15 DIAGNOSIS — Z885 Allergy status to narcotic agent status: Secondary | ICD-10-CM | POA: Diagnosis not present

## 2021-12-15 DIAGNOSIS — N281 Cyst of kidney, acquired: Secondary | ICD-10-CM | POA: Diagnosis not present

## 2021-12-15 DIAGNOSIS — R112 Nausea with vomiting, unspecified: Secondary | ICD-10-CM | POA: Diagnosis not present

## 2021-12-15 DIAGNOSIS — K219 Gastro-esophageal reflux disease without esophagitis: Secondary | ICD-10-CM | POA: Diagnosis not present

## 2021-12-15 DIAGNOSIS — Z880 Allergy status to penicillin: Secondary | ICD-10-CM | POA: Diagnosis not present

## 2021-12-15 DIAGNOSIS — K5730 Diverticulosis of large intestine without perforation or abscess without bleeding: Secondary | ICD-10-CM | POA: Diagnosis not present

## 2021-12-15 DIAGNOSIS — K449 Diaphragmatic hernia without obstruction or gangrene: Secondary | ICD-10-CM | POA: Diagnosis not present

## 2021-12-15 DIAGNOSIS — Z79899 Other long term (current) drug therapy: Secondary | ICD-10-CM | POA: Diagnosis not present

## 2021-12-15 DIAGNOSIS — Z883 Allergy status to other anti-infective agents status: Secondary | ICD-10-CM | POA: Diagnosis not present

## 2021-12-15 DIAGNOSIS — Z91041 Radiographic dye allergy status: Secondary | ICD-10-CM | POA: Diagnosis not present

## 2021-12-15 DIAGNOSIS — R509 Fever, unspecified: Secondary | ICD-10-CM | POA: Diagnosis not present

## 2021-12-15 DIAGNOSIS — Z888 Allergy status to other drugs, medicaments and biological substances status: Secondary | ICD-10-CM | POA: Diagnosis not present

## 2021-12-15 DIAGNOSIS — Z882 Allergy status to sulfonamides status: Secondary | ICD-10-CM | POA: Diagnosis not present

## 2021-12-15 DIAGNOSIS — K529 Noninfective gastroenteritis and colitis, unspecified: Secondary | ICD-10-CM | POA: Diagnosis not present

## 2021-12-15 DIAGNOSIS — K5790 Diverticulosis of intestine, part unspecified, without perforation or abscess without bleeding: Secondary | ICD-10-CM | POA: Diagnosis not present

## 2021-12-15 DIAGNOSIS — Z20822 Contact with and (suspected) exposure to covid-19: Secondary | ICD-10-CM | POA: Diagnosis not present

## 2021-12-15 DIAGNOSIS — N189 Chronic kidney disease, unspecified: Secondary | ICD-10-CM | POA: Diagnosis not present

## 2022-01-06 ENCOUNTER — Encounter: Payer: Self-pay | Admitting: Internal Medicine

## 2022-01-06 DIAGNOSIS — Z91041 Radiographic dye allergy status: Secondary | ICD-10-CM | POA: Diagnosis not present

## 2022-01-06 DIAGNOSIS — Z79899 Other long term (current) drug therapy: Secondary | ICD-10-CM | POA: Diagnosis not present

## 2022-01-06 DIAGNOSIS — R1031 Right lower quadrant pain: Secondary | ICD-10-CM | POA: Diagnosis not present

## 2022-01-06 DIAGNOSIS — E1122 Type 2 diabetes mellitus with diabetic chronic kidney disease: Secondary | ICD-10-CM | POA: Diagnosis not present

## 2022-01-06 DIAGNOSIS — K449 Diaphragmatic hernia without obstruction or gangrene: Secondary | ICD-10-CM | POA: Diagnosis not present

## 2022-01-06 DIAGNOSIS — N132 Hydronephrosis with renal and ureteral calculous obstruction: Secondary | ICD-10-CM | POA: Diagnosis not present

## 2022-01-06 DIAGNOSIS — Z880 Allergy status to penicillin: Secondary | ICD-10-CM | POA: Diagnosis not present

## 2022-01-06 DIAGNOSIS — R109 Unspecified abdominal pain: Secondary | ICD-10-CM | POA: Diagnosis not present

## 2022-01-06 DIAGNOSIS — Z888 Allergy status to other drugs, medicaments and biological substances status: Secondary | ICD-10-CM | POA: Diagnosis not present

## 2022-01-06 DIAGNOSIS — Z7984 Long term (current) use of oral hypoglycemic drugs: Secondary | ICD-10-CM | POA: Diagnosis not present

## 2022-01-06 DIAGNOSIS — N189 Chronic kidney disease, unspecified: Secondary | ICD-10-CM | POA: Diagnosis not present

## 2022-01-06 DIAGNOSIS — Z881 Allergy status to other antibiotic agents status: Secondary | ICD-10-CM | POA: Diagnosis not present

## 2022-01-06 DIAGNOSIS — Z882 Allergy status to sulfonamides status: Secondary | ICD-10-CM | POA: Diagnosis not present

## 2022-01-06 DIAGNOSIS — Z9049 Acquired absence of other specified parts of digestive tract: Secondary | ICD-10-CM | POA: Diagnosis not present

## 2022-01-09 ENCOUNTER — Ambulatory Visit: Payer: BC Managed Care – PPO | Admitting: Family Medicine

## 2022-01-09 ENCOUNTER — Other Ambulatory Visit: Payer: Self-pay

## 2022-01-09 ENCOUNTER — Encounter: Payer: Self-pay | Admitting: Family Medicine

## 2022-01-09 VITALS — BP 127/71 | HR 98 | Resp 18 | Ht 67.0 in | Wt 195.0 lb

## 2022-01-09 DIAGNOSIS — R319 Hematuria, unspecified: Secondary | ICD-10-CM | POA: Diagnosis not present

## 2022-01-09 DIAGNOSIS — J069 Acute upper respiratory infection, unspecified: Secondary | ICD-10-CM | POA: Diagnosis not present

## 2022-01-09 DIAGNOSIS — N200 Calculus of kidney: Secondary | ICD-10-CM

## 2022-01-09 DIAGNOSIS — R000 Tachycardia, unspecified: Secondary | ICD-10-CM

## 2022-01-09 NOTE — Patient Instructions (Signed)
Stop the Cipro for now.

## 2022-01-09 NOTE — Addendum Note (Signed)
Addended by: Izora Gala on: 01/09/2022 04:54 PM   Modules accepted: Orders

## 2022-01-09 NOTE — Progress Notes (Signed)
Established Patient Office Visit  Subjective:  Patient ID: Nicole Mata, female    DOB: [birth year]  Age: 61 y.o. MRN: [id number]  CC:  Chief Complaint  Patient presents with   Hospital Follow up     Kidney stones and UTI. Patient would like to discuss Cipro. Patient states it is causing her to have hoarse voice and would like to know if she should continue taking it?   Cough    Sinus drainage, nasal congestion and sore throat 2-3 days.    Elevated HR    Since having UTI     HPI Nicole Mata presents for   Recent ED follow-up.  She ended up getting gastroenteritis and then ended up having to go to the emergency department on January 12 for dehydration she received IV fluids and then started feeling better.  Then on February 1 she started having kidney pain which was consistent with her prior history of kidney stones.  But this time it was not letting up like it typically will has in the past so she is not going to the emergency department the next day. They dx her with a kidney stone and a UTI and started her on Cipro.  She did have a CT abdomen pelvis without contrast performed showing mild right hydroureteronephrosis with a 5 mm stone in the proximal to mid right ureter.  No other significant findings.  Patient would like to discuss Cipro. Patient states it is causing her to have hoarse voice and would like to know if she should continue taking it.  She is feeling some better. No longer seeing blood in her urine.    Sinus drainage, nasal congestion and sore throat 2-3 days.  She says she had a coworker who was also sick.  Using some DayQuil.  HR has been elevated since taking UTI. She wonders if could be a medication side effect.    Past Medical History:  Diagnosis Date   Ankle fracture, right 04/12/2013   COVID-19    no vaccine   Diabetes mellitus without complication (HCC)    Diverticulosis    Gastropathy    reactive   GERD (gastroesophageal reflux disease)     Helicobacter pylori gastritis    Hiatal hernia    IBS (irritable bowel syndrome)    Ileitis    PONV (postoperative nausea and vomiting)     Past Surgical History:  Procedure Laterality Date   CHOLECYSTECTOMY  2001   LAPAROSCOPY  11/19/2012   Procedure: LAPAROSCOPY DIAGNOSTIC;  Surgeon: Joyice Faster. Cornett, MD;  Location: [facility];  Service: General;  Laterality: N/A;  Diagnostic laparoscopy   SHOULDER SURGERY  2002, 2003   bilateral for bone spurs and torn RC   UMBILICAL HERNIA REPAIR  11/19/2012   Procedure: LAPAROSCOPIC UMBILICAL HERNIA;  Surgeon: Joyice Faster. Cornett, MD;  Location: [facility];  Service: General;  Laterality: N/A;  ventral hernia repair with mesh    Family History  Problem Relation Age of Onset   Heart disease Other        family disease   Heart attack Father 38       grandparents   Hypertension Father    Hyperlipidemia Father    Diabetes Maternal Grandmother    Diabetes Maternal Uncle    Ovarian cancer Maternal Aunt        x 2   Hypertension Mother    Hyperlipidemia Mother    Diabetes type II Mother    Diabetes Brother  Ovarian cancer Maternal Aunt    Colon cancer Neg Hx     Social History   Socioeconomic History   Marital status: Married    Spouse name: Not on file   Number of children: 0   Years of education: Not on file   Highest education level: Not on file  Occupational History   Occupation: IRA specialist  Tobacco Use   Smoking status: Never   Smokeless tobacco: Never  Vaping Use   Vaping Use: Never used  Substance and Sexual Activity   Alcohol use: Yes    Comment: very rarely   Drug use: No   Sexual activity: Yes    Partners: Male    Comment: divorced, no kids, customer service, 2 caffeine drinks daily.  Other Topics Concern   Not on file  Social History Narrative   1 can of soda a day.  No regular exercise.    Social Determinants of Health   Financial Resource Strain: Not on file  Food Insecurity: Not on file  Transportation  Needs: Not on file  Physical Activity: Not on file  Stress: Not on file  Social Connections: Not on file  Intimate Partner Violence: Not on file    Outpatient Medications Prior to Visit  Medication Sig Dispense Refill   ciprofloxacin (CIPRO) 500 MG tablet Take 500 mg by mouth in the morning and at bedtime.     CONTOUR TEST test strip FOR CHECKING BLOOD SUGAR ONCE DAILY 100 strip 12   DEXILANT 60 MG capsule Take 1 capsule (60 mg total) by mouth daily before breakfast. 30 capsule 10   diclofenac (VOLTAREN) 75 MG EC tablet TAKE 1 TABLET(75 MG) BY MOUTH TWICE DAILY 30 tablet 0   dicyclomine (BENTYL) 10 MG capsule Take 1 capsule (10 mg total) by mouth 2 (two) times daily. 60 capsule 6   fluticasone (FLONASE) 50 MCG/ACT nasal spray Place 2 sprays into the nose daily. 16 g 2   gabapentin (NEURONTIN) 100 MG capsule Take 1 capsule (100 mg total) by mouth at bedtime. 90 capsule 1   hydrocortisone (ANUSOL-HC) 25 MG suppository Use 1 suppository at night time for 5 to 7 days as needed for hemorrhoidal symptoms 12 suppository 6   insulin degludec (TRESIBA FLEXTOUCH) 100 UNIT/ML FlexTouch Pen Inject 30-40 Units into the skin daily. 9 mL 2   Insulin Pen Needle (B-D ULTRAFINE III SHORT PEN) 31G X 8 MM MISC INJECT INTO THE SKIN ONCE DAILY AS DIRECTED 100 each PRN   ketotifen (ZADITOR) 0.025 % ophthalmic solution Place 1 drop into both eyes every morning.     Lancets (FREESTYLE) lancets For testing blood sugars up to 2 times daily. DX: E11.9. Please make sure had the dispenser/cartridge. Thank you 100 each 12   Misc Natural Products (APPLE CIDER VINEGAR DIET) TABS Take 1 tablet by mouth daily. Goli ACV     ondansetron (ZOFRAN-ODT) 4 MG disintegrating tablet Take by mouth.     pravastatin (PRAVACHOL) 20 MG tablet Take 1 tablet (20 mg total) by mouth every other day. At bedtime. 45 tablet 1   SitaGLIPtin-MetFORMIN HCl (JANUMET XR) [phone] MG TB24 Take 1 tablet by mouth daily. 90 tablet 1   tirzepatide  (MOUNJARO) 2.5 MG/0.5ML Pen Inject 2.5 mg into the skin once a week. 2 mL 1   No facility-administered medications prior to visit.    Allergies  Allergen Reactions   Penicillins Anaphylaxis   Sulfa Drugs Cross Reactors Anaphylaxis   Contrast Media [Iodinated Contrast Media] Hives  Atorvastatin Other (See Comments)    Muscle aches   Gabapentin Other (See Comments)    GERD   Januvia [Sitagliptin] Other (See Comments)    Hot flashes and flushed and dizzy   Morphine And Related Nausea And Vomiting    Pt chooses not to take it   Prednisone     Other reaction(s): Confusion   Azithromycin Other (See Comments)    Other reaction(s): Other Makes her feel really crazy. Makes her feel really crazy.   Hydrocodone Nausea And Vomiting   Invokana [Canagliflozin] Rash    Rash starting on her face    ROS Review of Systems    Objective:    Physical Exam Constitutional:      Appearance: She is well-developed.  HENT:     Head: Normocephalic and atraumatic.     Right Ear: Ear canal and external ear normal.     Left Ear: Tympanic membrane, ear canal and external ear normal.     Ears:     Comments: TM is clear but there is some bulging and distorted light reflex.  No effusion.    Nose: Nose normal.     Mouth/Throat:     Mouth: Mucous membranes are moist.     Pharynx: Oropharynx is clear. No oropharyngeal exudate or posterior oropharyngeal erythema.  Eyes:     Conjunctiva/sclera: Conjunctivae normal.     Pupils: Pupils are equal, round, and reactive to light.  Neck:     Thyroid: No thyromegaly.  Cardiovascular:     Rate and Rhythm: Normal rate and regular rhythm.     Heart sounds: Normal heart sounds.  Pulmonary:     Effort: Pulmonary effort is normal.     Breath sounds: Normal breath sounds. No wheezing.  Musculoskeletal:     Cervical back: Neck supple.  Lymphadenopathy:     Cervical: No cervical adenopathy.  Skin:    General: Skin is warm and dry.  Neurological:     Mental  Status: She is alert and oriented to person, place, and time.    BP 127/71    Pulse 98    Resp 18    Ht '5\' 7"'  (1.702 m)    Wt 195 lb (88.5 kg)    LMP 12/04/2013    SpO2 97%    BMI 30.54 kg/m  Wt Readings from Last 3 Encounters:  01/09/22 195 lb (88.5 kg)  11/21/21 204 lb (92.5 kg)  10/06/21 205 lb (93 kg)     There are no preventive care reminders to display for this patient.  There are no preventive care reminders to display for this patient.  Lab Results  Component Value Date   TSH 1.36 07/25/2021   Lab Results  Component Value Date   WBC 7.5 11/21/2021   HGB 13.4 11/21/2021   HCT 39.3 11/21/2021   MCV 87.7 11/21/2021   PLT 311 11/21/2021   Lab Results  Component Value Date   NA 137 11/21/2021   K 5.2 11/21/2021   CO2 29 11/21/2021   GLUCOSE 162 (H) 11/21/2021   BUN 14 11/21/2021   CREATININE 0.73 11/21/2021   BILITOT 0.5 11/21/2021   ALKPHOS 74 07/18/2017   AST 19 11/21/2021   ALT 17 11/21/2021   PROT 7.2 11/21/2021   ALBUMIN 3.9 07/18/2017   CALCIUM 10.1 11/21/2021   EGFR 95 11/21/2021   Lab Results  Component Value Date   CHOL 177 11/21/2021   Lab Results  Component Value Date   HDL 48 (L)  11/21/2021   Lab Results  Component Value Date   LDLCALC 109 (H) 11/21/2021   Lab Results  Component Value Date   TRIG 107 11/21/2021   Lab Results  Component Value Date   CHOLHDL 3.7 11/21/2021   Lab Results  Component Value Date   HGBA1C 8.5 (A) 11/21/2021      Assessment & Plan:   Problem List Items Addressed This Visit   None Visit Diagnoses     Hematuria, unspecified type    -  Primary   Viral upper respiratory tract infection       Tachycardia       Kidney stone           URI -consistent with viral illness.  We did encourage her to test for COVID when she got home with a home kit.  Call if not feeling better by the end of the week I did encourage her to try to work from home if since she is able to.  Okay to use over-the-counter cough  and cold medication.  Tachycardia-also under 100 today.  Not sure if it is just from recent illness and passing kidney stone she is feeling better but not quite at 100% yet.  Or if it could be from the Cipro.  We will get a go ahead and have her stop it since her urine culture was actually negative.  And see if the tachycardia improves this week.  Hematuria from kidney stones.  Urine culture in the ED was negative organ to repeat that today get a go ahead and have her stop the Cipro based on the culture results and then we can always restart if we need to.  Kidney stone-overall she is recovering well.  Just encouraged her to drink plenty of water and fluids.  No orders of the defined types were placed in this encounter.   Follow-up: No follow-ups on file.    Beatrice Lecher, MD

## 2022-01-11 ENCOUNTER — Telehealth: Payer: Self-pay

## 2022-01-11 ENCOUNTER — Other Ambulatory Visit: Payer: Self-pay

## 2022-01-11 LAB — URINE CULTURE
MICRO NUMBER:: 12971657
SPECIMEN QUALITY:: ADEQUATE

## 2022-01-11 NOTE — Telephone Encounter (Addendum)
Initiated Prior authorization [facility]:[id number] 60MG  dr capsules Via: Covermymeds Case/Key:  [id number] Status: approved as of 01/11/22 Reason:Effective from 01/11/2022 through 01/10/2023. Notified Pt via: Mychart

## 2022-01-11 NOTE — Progress Notes (Signed)
Hi Sidonie, urine culture is negative.  I really do think the blood and abnormal urine was just from passing the stone.

## 2022-01-16 DIAGNOSIS — N202 Calculus of kidney with calculus of ureter: Secondary | ICD-10-CM | POA: Diagnosis not present

## 2022-01-16 DIAGNOSIS — R8271 Bacteriuria: Secondary | ICD-10-CM | POA: Diagnosis not present

## 2022-01-31 ENCOUNTER — Ambulatory Visit: Payer: BC Managed Care – PPO | Admitting: Internal Medicine

## 2022-01-31 ENCOUNTER — Encounter: Payer: Self-pay | Admitting: Internal Medicine

## 2022-01-31 VITALS — BP 124/72 | HR 97 | Ht 67.0 in | Wt 197.2 lb

## 2022-01-31 DIAGNOSIS — K219 Gastro-esophageal reflux disease without esophagitis: Secondary | ICD-10-CM

## 2022-01-31 DIAGNOSIS — R109 Unspecified abdominal pain: Secondary | ICD-10-CM

## 2022-01-31 DIAGNOSIS — K589 Irritable bowel syndrome without diarrhea: Secondary | ICD-10-CM | POA: Diagnosis not present

## 2022-01-31 MED ORDER — DICYCLOMINE HCL 10 MG PO CAPS: 10.0000 mg | ORAL_CAPSULE | Freq: Two times a day (BID) | ORAL | 6 refills | Status: DC

## 2022-01-31 NOTE — Patient Instructions (Signed)
If you are age 59 or older, your body mass index should be between 23-30. Your Body mass index is 30.89 kg/m. If this is out of the aforementioned range listed, please consider follow up with your Primary Care Provider.  If you are age 70 or younger, your body mass index should be between 19-25. Your Body mass index is 30.89 kg/m. If this is out of the aformentioned range listed, please consider follow up with your Primary Care Provider.   ________________________________________________________  The [facility] providers would like to encourage you to use [facility] to communicate with providers for non-urgent requests or questions.  Due to long hold times on the telephone, sending your provider a message by [facility] may be a faster and more efficient way to get a response.  Please allow 48 business hours for a response.  Please remember that this is for non-urgent requests.  _______________________________________________________  We have sent the following medications to your pharmacy for you to pick up at your convenience:  Bentyl  Take 2 tablespoons of Citrucel in 12-14 ounces of water daily

## 2022-01-31 NOTE — Progress Notes (Signed)
HISTORY OF PRESENT ILLNESS:  Nicole Mata is a 61 y.o. female, former patient of Dr. Olevia Perches, who I saw in the office January 2020 regarding refractory reflux symptoms and the need for colonoscopy.  She has a history of irritable bowel syndrome with alternating bowel habits.  See that dictation for details.  She presents today regarding problems with abdominal pain and loose stools for which she was evaluated in the ER recently.  She was last seen in this office February 2022 regarding GERD, history of colon polyps, diverticulosis, and complaints of abdominal discomfort.  She was prescribed Bentyl.  Patient tells me that she developed more severe abdominal discomfort with loose stools for which she went to the emergency room December 15, 2021.  She tells me this was worse than her usual problems.  She underwent extensive work-up including blood work and urinalysis.  This was unremarkable.  Normal lipase.  CT scan was performed.  No acute abnormalities.  She was given Zofran for nausea.  Her acute symptoms improved.  No recurrence.  She tells me that she has been on [facility] for about 6 weeks.  She feels that immediately after taking an injection she has some abdominal discomfort and loose stools.  However, this is helping her blood sugars.  She is also had 10 pound weight loss.  Currently she describes her bowel habits as alternating.  She will take Imodium once or twice per week for loose stools and stool softeners twice per week for constipation.  In terms of GERD, she continues on Dexilant 60 mg daily.  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  No reflux symptoms.  No dysphagia.  She was told to follow-up in this office regarding her problems with abdominal pain.  Her last complete colonoscopy was performed February 2020.  She was found to have adenomatous colon polyps and diverticulosis as well as hemorrhoids.  Follow-up in 5 years recommended.  REVIEW OF SYSTEMS:  All non-GI ROS negative unless otherwise  stated in the HPI except for sinus and allergy trouble, back pain, fatigue  Past Medical History:  Diagnosis Date   Ankle fracture, right 04/12/2013   COVID-19    no vaccine   Diabetes mellitus without complication ([place])    Diverticulosis    Gastropathy    reactive   GERD (gastroesophageal reflux disease)    Helicobacter pylori gastritis    Hiatal hernia    IBS (irritable bowel syndrome)    Ileitis    Kidney stones 12/2021   PONV (postoperative nausea and vomiting)     Past Surgical History:  Procedure Laterality Date   CHOLECYSTECTOMY  2001   LAPAROSCOPY  11/19/2012   Procedure: LAPAROSCOPY DIAGNOSTIC;  Surgeon: Joyice Faster. Cornett, MD;  Location: [place] [facility];  Service: General;  Laterality: N/A;  Diagnostic laparoscopy   SHOULDER SURGERY  2002, 2003   bilateral for bone spurs and torn RC   UMBILICAL HERNIA REPAIR  11/19/2012   Procedure: LAPAROSCOPIC UMBILICAL HERNIA;  Surgeon: Joyice Faster. Cornett, MD;  Location: [place];  Service: General;  Laterality: N/A;  ventral hernia repair with mesh    Social History Nicole Mata  reports that she has never smoked. She has never used smokeless tobacco. She reports current alcohol use. She reports that she does not use drugs.  family history includes Diabetes in her brother, maternal grandmother, and maternal uncle; Diabetes type II in her mother; Heart attack (age of onset: 75) in her father; Heart disease in an other family member; Hyperlipidemia  in her father and mother; Hypertension in her father and mother; Ovarian cancer in her maternal aunt and maternal aunt.  Allergies  Allergen Reactions   Penicillins Anaphylaxis   Sulfa Drugs Cross Reactors Anaphylaxis   Contrast Media [Iodinated Contrast Media] Hives   Atorvastatin Other (See Comments)    Muscle aches   Gabapentin Other (See Comments)    GERD   Januvia [Sitagliptin] Other (See Comments)    Hot flashes and flushed and dizzy   Morphine And Related Nausea And Vomiting    Pt  chooses not to take it   Prednisone     Other reaction(s): Confusion   Azithromycin Other (See Comments)    Other reaction(s): Other Makes her feel really crazy. Makes her feel really crazy.   Hydrocodone Nausea And Vomiting   Invokana [Canagliflozin] Rash    Rash starting on her face       PHYSICAL EXAMINATION: Vital signs: BP 124/72    Pulse 97    Ht 5\' 7"  (1.702 m)    Wt 197 lb 3.2 oz (89.4 kg)    LMP 12/04/2013    SpO2 97%    BMI 30.89 kg/m   Constitutional: generally well-appearing, no acute distress Psychiatric: alert and oriented x3, cooperative Eyes: extraocular movements intact, anicteric, conjunctiva pink Mouth: oral pharynx moist, no lesions Neck: supple no lymphadenopathy Cardiovascular: heart regular rate and rhythm, no murmur Lungs: clear to auscultation bilaterally Abdomen: soft, nontender, nondistended, no obvious ascites, no peritoneal signs, normal bowel sounds, no organomegaly Rectal: Omitted Extremities: no clubbing, cyanosis, or lower extremity edema bilaterally Skin: no lesions on visible extremities Neuro: No focal deficits.  Cranial nerves intact  ASSESSMENT:  1.  Irritable bowel syndrome.  Alternating.  Ongoing. 2.  GERD.  Symptoms controlled with Dexilant 3.  History of adenomatous colon polyps.  Surveillance up-to-date 4.  Recent problems with worsening abdominal pain and diarrhea.  Probable viral gastroenteritis.  Resolved 5.  General medical problems   PLAN:  1.  Initiate Citrucel 2 tablespoons daily.  This to help regulate bowels. 2.  Refill dicyclomine.  Medication side effects/risks reviewed.  Use as needed for abdominal pain 3.  Reflux precautions 4.  Weight loss 5.  Continue Dexilant 6.  Ongoing general medical care with PCP 7.  Surveillance colonoscopy around February 2025  A total time of 30 minutes was spent preparing to see the patient, reviewing outside blood work, reviewing outside CT scan, obtaining comprehensive history,  performing medically appropriate physical examination, counseling and educating the patient regarding the above listed issues, directing medical therapy, recommending appropriate follow-up intervals, and documenting clinical information in the health record

## 2022-02-06 ENCOUNTER — Ambulatory Visit (INDEPENDENT_AMBULATORY_CARE_PROVIDER_SITE_OTHER): Payer: BC Managed Care – PPO | Admitting: Obstetrics and Gynecology

## 2022-02-06 ENCOUNTER — Other Ambulatory Visit: Payer: Self-pay

## 2022-02-06 ENCOUNTER — Other Ambulatory Visit (HOSPITAL_COMMUNITY)
Admission: RE | Admit: 2022-02-06 | Discharge: 2022-02-06 | Disposition: A | Discharge status: Home / Self Care | Payer: BC Managed Care – PPO | Source: Ambulatory Visit | Attending: Obstetrics and Gynecology | Admitting: Obstetrics and Gynecology

## 2022-02-06 ENCOUNTER — Encounter: Payer: Self-pay | Admitting: Obstetrics and Gynecology

## 2022-02-06 VITALS — BP 121/76 | HR 94 | Ht 67.0 in | Wt 196.0 lb

## 2022-02-06 DIAGNOSIS — Z1231 Encounter for screening mammogram for malignant neoplasm of breast: Secondary | ICD-10-CM | POA: Diagnosis not present

## 2022-02-06 DIAGNOSIS — R3915 Urgency of urination: Secondary | ICD-10-CM | POA: Diagnosis not present

## 2022-02-06 DIAGNOSIS — Z01419 Encounter for gynecological examination (general) (routine) without abnormal findings: Secondary | ICD-10-CM

## 2022-02-06 DIAGNOSIS — Z124 Encounter for screening for malignant neoplasm of cervix: Secondary | ICD-10-CM | POA: Diagnosis not present

## 2022-02-06 NOTE — Progress Notes (Addendum)
GYNECOLOGY ANNUAL PREVENTATIVE CARE ENCOUNTER NOTE  Subjective:   Nicole Mata is a 61 y.o. G15P0010 female here for a annual gynecologic exam. Current complaints: none.    Denies abnormal vaginal bleeding, discharge, pelvic pain, problems with intercourse or other gynecologic concerns. Declines STI screen.  Also reports urinary urgency, not overly bothersome.  Previously had genetic testing as family history x 2 aunts with ovarian cancer, testing negative.   Gynecologic History Patient's last menstrual period was 12/04/2013. Contraception: post menopausal status Last Pap: 2019. Results: normal Last mammogram: 2022. Results: Bir[place] 1 DEXA: has never had  Obstetric History OB History  Gravida Para Term Preterm AB Living  1       1    SAB IAB Ectopic Multiple Live Births  1            # Outcome Date GA Lbr Len/2nd Weight Sex Delivery Anes PTL Lv  1 SAB             Past Medical History:  Diagnosis Date   Ankle fracture, right 04/12/2013   COVID-19    no vaccine   Diabetes mellitus without complication ([place])    Diverticulosis    Gastropathy    reactive   GERD (gastroesophageal reflux disease)    Helicobacter pylori gastritis    Hiatal hernia    IBS (irritable bowel syndrome)    Ileitis    Kidney stones 12/2021   PONV (postoperative nausea and vomiting)     Past Surgical History:  Procedure Laterality Date   CHOLECYSTECTOMY  2001   LAPAROSCOPY  11/19/2012   Procedure: LAPAROSCOPY DIAGNOSTIC;  Surgeon: Marcello Moores A. Cornett, MD;  Location: [place] [facility];  Service: General;  Laterality: N/A;  Diagnostic laparoscopy   SHOULDER SURGERY  2002, 2003   bilateral for bone spurs and torn RC   UMBILICAL HERNIA REPAIR  11/19/2012   Procedure: LAPAROSCOPIC UMBILICAL HERNIA;  Surgeon: Joyice Faster. Cornett, MD;  Location: [place];  Service: General;  Laterality: N/A;  ventral hernia repair with mesh    Current Outpatient Medications on File Prior to Visit  Medication Sig Dispense Refill    CONTOUR TEST test strip FOR CHECKING BLOOD SUGAR ONCE DAILY 100 strip 12   DEXILANT 60 MG capsule Take 1 capsule (60 mg total) by mouth daily before breakfast. 30 capsule 10   dicyclomine (BENTYL) 10 MG capsule Take 1 capsule (10 mg total) by mouth 2 (two) times daily. 60 capsule 6   dimenhyDRINATE (DRAMAMINE) 50 MG tablet Take 50 mg by mouth every 8 (eight) hours as needed.     fluticasone (FLONASE) 50 MCG/ACT nasal spray Place 2 sprays into the nose daily. 16 g 2   gabapentin (NEURONTIN) 100 MG capsule Take 1 capsule (100 mg total) by mouth at bedtime. 90 capsule 1   hydrocortisone (ANUSOL-HC) 25 MG suppository Use 1 suppository at night time for 5 to 7 days as needed for hemorrhoidal symptoms 12 suppository 6   Insulin Pen Needle (B-D ULTRAFINE III SHORT PEN) 31G X 8 MM MISC INJECT INTO THE SKIN ONCE DAILY AS DIRECTED 100 each PRN   ketotifen (ZADITOR) 0.025 % ophthalmic solution Place 1 drop into both eyes every morning.     Lancets (FREESTYLE) lancets For testing blood sugars up to 2 times daily. DX: E11.9. Please make sure had the dispenser/cartridge. Thank you 100 each 12   Misc Natural Products (APPLE CIDER VINEGAR DIET) TABS Take 1 tablet by mouth as needed. [place] ACV  ondansetron (ZOFRAN-ODT) 4 MG disintegrating tablet Take by mouth every 8 (eight) hours as needed.     pravastatin (PRAVACHOL) 20 MG tablet Take 1 tablet (20 mg total) by mouth every other day. At bedtime. 45 tablet 1   SitaGLIPtin-MetFORMIN HCl (JANUMET XR) [phone] MG TB24 Take 1 tablet by mouth daily. 90 tablet 1   tirzepatide (MOUNJARO) 2.5 MG/0.5ML Pen Inject 2.5 mg into the skin once a week. 2 mL 1   No current facility-administered medications on file prior to visit.    Allergies  Allergen Reactions   Penicillins Anaphylaxis   Sulfa Drugs Cross Reactors Anaphylaxis   Contrast Media [Iodinated Contrast Media] Hives   Atorvastatin Other (See Comments)    Muscle aches   Gabapentin Other (See Comments)     GERD   Januvia [Sitagliptin] Other (See Comments)    Hot flashes and flushed and dizzy   Morphine And Related Nausea And Vomiting    Pt chooses not to take it   Prednisone     Other reaction(s): Confusion   Azithromycin Other (See Comments)    Other reaction(s): Other Makes her feel really crazy. Makes her feel really crazy.   Hydrocodone Nausea And Vomiting   Invokana [Canagliflozin] Rash    Rash starting on her face    Social History   Socioeconomic History   Marital status: Married    Spouse name: Not on file   Number of children: 0   Years of education: Not on file   Highest education level: Not on file  Occupational History   Occupation: IRA specialist  Tobacco Use   Smoking status: Never   Smokeless tobacco: Never  Vaping Use   Vaping Use: Never used  Substance and Sexual Activity   Alcohol use: Yes    Comment: very rarely   Drug use: No   Sexual activity: Yes    Partners: Male    Comment: divorced, no kids, customer service, 2 caffeine drinks daily.  Other Topics Concern   Not on file  Social History Narrative   1 can of soda a day.  No regular exercise.    Social Determinants of Health   Financial Resource Strain: Not on file  Food Insecurity: Not on file  Transportation Needs: Not on file  Physical Activity: Not on file  Stress: Not on file  Social Connections: Not on file  Intimate Partner Violence: Not on file    Family History  Problem Relation Age of Onset   Heart disease Other        family disease   Heart attack Father 1       grandparents   Hypertension Father    Hyperlipidemia Father    Diabetes Maternal Grandmother    Diabetes Maternal Uncle    Ovarian cancer Maternal Aunt        x 2   Hypertension Mother    Hyperlipidemia Mother    Diabetes type II Mother    Diabetes Brother    Ovarian cancer Maternal Aunt    Colon cancer Neg Hx     The following portions of the patient's history were reviewed and updated as appropriate:  allergies, current medications, past family history, past medical history, past social history, past surgical history and problem list.  Review of Systems Pertinent items are noted in HPI.   Objective:  BP 121/76    Pulse 94    Ht '5\' 7"'$  (1.702 m)    Wt 196 lb (88.9 kg)  LMP 12/04/2013    BMI 30.70 kg/m  CONSTITUTIONAL: Well-developed, well-nourished female in no acute distress.  HENT:  Normocephalic, atraumatic, External right and left ear normal. Oropharynx is clear and moist EYES: Conjunctivae and EOM are normal. Pupils are equal, round, and reactive to light. No scleral icterus.  NECK: Normal range of motion, supple, no masses.  Normal thyroid.  SKIN: Skin is warm and dry. No rash noted. Not diaphoretic. No erythema. No pallor. NEUROLOGIC: Alert and oriented to person, place, and time. Normal reflexes, muscle tone coordination. No cranial nerve deficit noted. PSYCHIATRIC: Normal mood and affect. Normal behavior. Normal judgment and thought content. CARDIOVASCULAR: Normal heart rate noted RESPIRATORY: Effort sounds normal, no problems with respiration noted. BREASTS: Symmetric in size. No masses, skin changes, nipple drainage, or lymphadenopathy. ABDOMEN: Soft, no distention noted.  No tenderness, rebound or guarding.  PELVIC: Normal appearing external genitalia; normal appearing vaginal mucosa and cervix.  No abnormal discharge noted.  Pap smear obtained. Normal uterine size, no other palpable masses, no uterine or adnexal tenderness. MUSCULOSKELETAL: Normal range of motion. No tenderness.  No cyanosis, clubbing, or edema.  2+ distal pulses.  Exam done with chaperone present.  Assessment and Plan:   1. Well woman exam with routine gynecological exam No issues - Cytology - PAP( [place])  2. Encounter for screening mammogram for malignant neoplasm of breast UTD  3. Cervical cancer screening Pap today  4. Urinary urgency - Not overly bothersome, reviewed avoidance of  irritating foods - will call if she would like referral to Urogyn for medical management   Will follow up results of pap smear and manage accordingly. Encouraged improvement in diet and exercise.  COVID vaccine declines Declines STI screen. Mammogram UTD Referral for colonoscopy n/a, UYTD Flu vaccine n/a DEXA not due based on age  Routine preventative health maintenance measures emphasized. Please refer to After Visit Summary for other counseling recommendations.    Feliz Beam, MD, [place] for [employer] [place])

## 2022-02-08 LAB — CYTOLOGY - PAP
Comment: NEGATIVE
Diagnosis: NEGATIVE
High risk HPV: NEGATIVE

## 2022-02-20 ENCOUNTER — Other Ambulatory Visit: Payer: Self-pay

## 2022-02-20 ENCOUNTER — Ambulatory Visit: Payer: BC Managed Care – PPO | Admitting: Family Medicine

## 2022-02-20 ENCOUNTER — Encounter: Payer: Self-pay | Admitting: Family Medicine

## 2022-02-20 VITALS — BP 121/62 | HR 87 | Ht 67.0 in | Wt 198.0 lb

## 2022-02-20 DIAGNOSIS — E118 Type 2 diabetes mellitus with unspecified complications: Secondary | ICD-10-CM

## 2022-02-20 DIAGNOSIS — E1142 Type 2 diabetes mellitus with diabetic polyneuropathy: Secondary | ICD-10-CM | POA: Diagnosis not present

## 2022-02-20 DIAGNOSIS — R110 Nausea: Secondary | ICD-10-CM

## 2022-02-20 LAB — POCT GLYCOSYLATED HEMOGLOBIN (HGB A1C): Hemoglobin A1C: 7.3 % — AB (ref 4.0–5.6)

## 2022-02-20 MED ORDER — GABAPENTIN 100 MG PO CAPS: 100.0000 mg | ORAL_CAPSULE | Freq: Every day | ORAL | 3 refills | Status: DC

## 2022-02-20 MED ORDER — TIRZEPATIDE 2.5 MG/0.5ML ~~LOC~~ SOAJ: 2.5000 mg | SUBCUTANEOUS | 1 refills | Status: DC

## 2022-02-20 MED ORDER — METFORMIN HCL ER 500 MG PO TB24: 1000.0000 mg | ORAL_TABLET | Freq: Every day | ORAL | 3 refills | Status: DC

## 2022-02-20 MED ORDER — ONDANSETRON 4 MG PO TBDP: 4.0000 mg | ORAL_TABLET | Freq: Three times a day (TID) | ORAL | 1 refills | Status: DC | PRN

## 2022-02-20 NOTE — Assessment & Plan Note (Signed)
A1c much improved.  We will continue with current regimen continue work on healthy food choices, portion control and regular exercise.  I will get a go ahead and discontinue the Januvia component of her Janumet since its overlapping with her Darcel Bayley and is most likely not getting much effect from that.  We will continue with the 1000 mg extended release metformin component. ?

## 2022-02-20 NOTE — Progress Notes (Signed)
? ?Established Patient Office Visit ? ?Subjective:  ?Patient ID: Nicole Mata, female    DOB: August 28, 1961  Age: 61 y.o. MRN: [id number] ? ?CC:  ?Chief Complaint  ?Patient presents with  ? Diabetes  ? ? ?HPI ?Nicole Mata presents for  ? ?Diabetes - no hypoglycemic events. No wounds or sores that are not healing well. No increased thirst or urination. Checking glucose at home. Taking medications as prescribed without any side effects. Still getting some nausea on Mounjaro at 2.63m  Still using zofran occ at night.    ? ?She has been trying to do a little better eating small protein.weight is stable.   ? ? ?Past Medical History:  ?Diagnosis Date  ? Ankle fracture, right 04/12/2013  ? COVID-19   ? no vaccine  ? Diabetes mellitus without complication ([place]   ? Diverticulosis   ? Gastropathy   ? reactive  ? GERD (gastroesophageal reflux disease)   ? Helicobacter pylori gastritis   ? Hiatal hernia   ? IBS (irritable bowel syndrome)   ? Ileitis   ? Kidney stones 12/2021  ? PONV (postoperative nausea and vomiting)   ? ? ?Past Surgical History:  ?Procedure Laterality Date  ? CHOLECYSTECTOMY  2001  ? LAPAROSCOPY  11/19/2012  ? Procedure: LAPAROSCOPY DIAGNOSTIC;  Surgeon: TJoyice Faster Cornett, MD;  [place] [facility]  Service: General;  Laterality: N/A;  Diagnostic laparoscopy  ? SHOULDER SURGERY  2002, 2003  ? bilateral for bone spurs and torn RC  ? UMBILICAL HERNIA REPAIR  11/19/2012  ? Procedure: LAPAROSCOPIC UMBILICAL HERNIA;  Surgeon: TJoyice Faster Cornett, MD;  [place] [facility]  Service: General;  Laterality: N/A;  ventral hernia repair with mesh  ? ? ?Family History  ?Problem Relation Age of Onset  ? Heart disease Other   ?     family disease  ? Heart attack Father 521 ?     grandparents  ? Hypertension Father   ? Hyperlipidemia Father   ? Diabetes Maternal Grandmother   ? Diabetes Maternal Uncle   ? Ovarian cancer Maternal Aunt   ?     x 2  ? Hypertension Mother   ? Hyperlipidemia Mother   ? Diabetes type II Mother   ?  Diabetes Brother   ? Ovarian cancer Maternal Aunt   ? Colon cancer Neg Hx   ? ? ?Social History  ? ?Socioeconomic History  ? Marital status: Married  ?  Spouse name: Not on file  ? Number of children: 0  ? Years of education: Not on file  ? Highest education level: Not on file  ?Occupational History  ? Occupation: IRA specialist  ?Tobacco Use  ? Smoking status: Never  ? Smokeless tobacco: Never  ?Vaping Use  ? Vaping Use: Never used  ?Substance and Sexual Activity  ? Alcohol use: Yes  ?  Comment: very rarely  ? Drug use: No  ? Sexual activity: Yes  ?  Partners: Male  ?  Comment: divorced, no kids, customer service, 2 caffeine drinks daily.  ?Other Topics Concern  ? Not on file  ?Social History Narrative  ? 1 can of soda a day.  No regular exercise.   ? ?Social Determinants of Health  ? ?Financial Resource Strain: Not on file  ?Food Insecurity: Not on file  ?Transportation Needs: Not on file  ?Physical Activity: Not on file  ?Stress: Not on file  ?Social Connections: Not on file  ?Intimate Partner Violence: Not on file  ? ? ?  Outpatient Medications Prior to Visit  ?Medication Sig Dispense Refill  ? CONTOUR TEST test strip FOR CHECKING BLOOD SUGAR ONCE DAILY 100 strip 12  ? DEXILANT 60 MG capsule Take 1 capsule (60 mg total) by mouth daily before breakfast. 30 capsule 10  ? dicyclomine (BENTYL) 10 MG capsule Take 1 capsule (10 mg total) by mouth 2 (two) times daily. 60 capsule 6  ? dimenhyDRINATE (DRAMAMINE) 50 MG tablet Take 50 mg by mouth every 8 (eight) hours as needed.    ? fluticasone (FLONASE) 50 MCG/ACT nasal spray Place 2 sprays into the nose daily. 16 g 2  ? hydrocortisone (ANUSOL-HC) 25 MG suppository Use 1 suppository at night time for 5 to 7 days as needed for hemorrhoidal symptoms 12 suppository 6  ? Insulin Pen Needle (B-D ULTRAFINE III SHORT PEN) 31G X 8 MM MISC INJECT INTO THE SKIN ONCE DAILY AS DIRECTED 100 each PRN  ? ketotifen (ZADITOR) 0.025 % ophthalmic solution Place 1 drop into both eyes every  morning.    ? Lancets (FREESTYLE) lancets For testing blood sugars up to 2 times daily. DX: E11.9. Please make sure had the dispenser/cartridge. Thank you 100 each 12  ? Misc Natural Products (APPLE CIDER VINEGAR DIET) TABS Take 1 tablet by mouth as needed. [place] ACV    ? pravastatin (PRAVACHOL) 20 MG tablet Take 1 tablet (20 mg total) by mouth every other day. At bedtime. 45 tablet 1  ? gabapentin (NEURONTIN) 100 MG capsule Take 1 capsule (100 mg total) by mouth at bedtime. 90 capsule 1  ? ondansetron (ZOFRAN-ODT) 4 MG disintegrating tablet Take by mouth every 8 (eight) hours as needed.    ? SitaGLIPtin-MetFORMIN HCl (JANUMET XR) [phone] MG TB24 Take 1 tablet by mouth daily. 90 tablet 1  ? tirzepatide (MOUNJARO) 2.5 MG/0.5ML Pen Inject 2.5 mg into the skin once a week. 2 mL 1  ? ?No facility-administered medications prior to visit.  ? ? ?Allergies  ?Allergen Reactions  ? Penicillins Anaphylaxis  ? Sulfa Drugs Cross Reactors Anaphylaxis  ? Contrast Media [Iodinated Contrast Media] Hives  ? Atorvastatin Other (See Comments)  ?  Muscle aches  ? Gabapentin Other (See Comments)  ?  GERD  ? Januvia [Sitagliptin] Other (See Comments)  ?  Hot flashes and flushed and dizzy  ? Morphine And Related Nausea And Vomiting  ?  Pt chooses not to take it  ? Prednisone   ?  Other reaction(s): Confusion  ? Azithromycin Other (See Comments)  ?  Other reaction(s): Other ?Makes her feel really crazy. ?Makes her feel really crazy.  ? Hydrocodone Nausea And Vomiting  ? Invokana [Canagliflozin] Rash  ?  Rash starting on her face  ? ? ?ROS ?Review of Systems ? ?  ?Objective:  ?  ?Physical Exam ?Constitutional:   ?   Appearance: Normal appearance. She is well-developed.  ?HENT:  ?   Head: Normocephalic and atraumatic.  ?Cardiovascular:  ?   Rate and Rhythm: Normal rate and regular rhythm.  ?   Heart sounds: Normal heart sounds.  ?Pulmonary:  ?   Effort: Pulmonary effort is normal.  ?   Breath sounds: Normal breath sounds.  ?Skin: ?   General:  Skin is warm and dry.  ?Neurological:  ?   Mental Status: She is alert and oriented to person, place, and time.  ?Psychiatric:     ?   Behavior: Behavior normal.  ? ? ?BP 121/62   Pulse 87   Ht 5' 7" (1.702  m)   Wt 198 lb (89.8 kg)   LMP 12/04/2013   SpO2 99%   BMI 31.01 kg/m?  ?Wt Readings from Last 3 Encounters:  ?02/20/22 198 lb (89.8 kg)  ?02/06/22 196 lb (88.9 kg)  ?01/31/22 197 lb 3.2 oz (89.4 kg)  ? ? ? ?Health Maintenance Due  ?Topic Date Due  ? Zoster Vaccines- Shingrix (1 of 2) Never done  ? URINE MICROALBUMIN  04/18/2022  ? ? ?There are no preventive care reminders to display for this patient. ? ?Lab Results  ?Component Value Date  ? TSH 1.36 07/25/2021  ? ?Lab Results  ?Component Value Date  ? WBC 7.5 11/21/2021  ? HGB 13.4 11/21/2021  ? HCT 39.3 11/21/2021  ? MCV 87.7 11/21/2021  ? PLT 311 11/21/2021  ? ?Lab Results  ?Component Value Date  ? NA 137 11/21/2021  ? K 5.2 11/21/2021  ? CO2 29 11/21/2021  ? GLUCOSE 162 (H) 11/21/2021  ? BUN 14 11/21/2021  ? CREATININE 0.73 11/21/2021  ? BILITOT 0.5 11/21/2021  ? ALKPHOS 74 07/18/2017  ? AST 19 11/21/2021  ? ALT 17 11/21/2021  ? PROT 7.2 11/21/2021  ? ALBUMIN 3.9 07/18/2017  ? CALCIUM 10.1 11/21/2021  ? EGFR 95 11/21/2021  ? ?Lab Results  ?Component Value Date  ? CHOL 177 11/21/2021  ? ?Lab Results  ?Component Value Date  ? HDL 48 (L) 11/21/2021  ? ?Lab Results  ?Component Value Date  ? LDLCALC 109 (H) 11/21/2021  ? ?Lab Results  ?Component Value Date  ? TRIG 107 11/21/2021  ? ?Lab Results  ?Component Value Date  ? CHOLHDL 3.7 11/21/2021  ? ?Lab Results  ?Component Value Date  ? HGBA1C 7.3 (A) 02/20/2022  ? ? ?  ?Assessment & Plan:  ? ?Problem List Items Addressed This Visit   ? ?  ? Endocrine  ? Diabetic peripheral neuropathy ([place])  ? Relevant Medications  ? gabapentin (NEURONTIN) 100 MG capsule  ? tirzepatide (MOUNJARO) 2.5 MG/0.5ML Pen  ? metFORMIN (GLUCOPHAGE-XR) 500 MG 24 hr tablet  ? Controlled diabetes mellitus type 2 with complications ([place]) -  Primary  ?  A1c much improved.  We will continue with current regimen continue work on healthy food choices, portion control and regular exercise.  I will get a go ahead and discontinue the Januvia component of her

## 2022-02-21 ENCOUNTER — Telehealth: Payer: Self-pay

## 2022-02-21 NOTE — Telephone Encounter (Addendum)
Initiated Prior authorization PJR:[id number] 2.'5MG'$ /0.5ML pen-injectors ?Via: Covermymeds ?Case/Key:[id number] ?Status: approved as of 02/21/22 ?Reason:Effective from 02/09/2022 through 02/08/2023. ?Notified Pt via: Mychart ?

## 2022-04-24 ENCOUNTER — Other Ambulatory Visit: Payer: Self-pay | Admitting: *Deleted

## 2022-04-24 MED ORDER — DEXILANT 60 MG PO CPDR: 60.0000 mg | DELAYED_RELEASE_CAPSULE | Freq: Every day | ORAL | 11 refills | Status: DC

## 2022-05-23 DIAGNOSIS — E119 Type 2 diabetes mellitus without complications: Secondary | ICD-10-CM | POA: Diagnosis not present

## 2022-05-23 LAB — HM DIABETES EYE EXAM

## 2022-05-29 ENCOUNTER — Encounter: Payer: Self-pay | Admitting: Family Medicine

## 2022-05-29 ENCOUNTER — Ambulatory Visit: Payer: BC Managed Care – PPO | Admitting: Family Medicine

## 2022-05-29 VITALS — BP 121/66 | HR 87 | Wt 192.0 lb

## 2022-05-29 DIAGNOSIS — E1142 Type 2 diabetes mellitus with diabetic polyneuropathy: Secondary | ICD-10-CM

## 2022-05-29 DIAGNOSIS — R110 Nausea: Secondary | ICD-10-CM

## 2022-05-29 DIAGNOSIS — E118 Type 2 diabetes mellitus with unspecified complications: Secondary | ICD-10-CM

## 2022-05-29 LAB — POCT UA - MICROALBUMIN
Creatinine, POC: 200 mg/dL
Microalbumin Ur, POC: 80 mg/L

## 2022-05-29 LAB — POCT GLYCOSYLATED HEMOGLOBIN (HGB A1C): Hemoglobin A1C: 8 % — AB (ref 4.0–5.6)

## 2022-05-29 MED ORDER — TIRZEPATIDE 5 MG/0.5ML ~~LOC~~ SOAJ: 5.0000 mg | SUBCUTANEOUS | 0 refills | Status: DC

## 2022-05-29 MED ORDER — ONDANSETRON 4 MG PO TBDP: 4.0000 mg | ORAL_TABLET | Freq: Three times a day (TID) | ORAL | 5 refills | Status: DC | PRN

## 2022-05-29 MED ORDER — PRAVASTATIN SODIUM 20 MG PO TABS: 20.0000 mg | ORAL_TABLET | ORAL | 3 refills | Status: DC

## 2022-05-30 LAB — BASIC METABOLIC PANEL WITH GFR
BUN: 16 mg/dL (ref 7–25)
CO2: 29 mmol/L (ref 20–32)
Calcium: 9.8 mg/dL (ref 8.6–10.4)
Chloride: 102 mmol/L (ref 98–110)
Creat: 0.77 mg/dL (ref 0.50–1.03)
Glucose, Bld: 188 mg/dL — ABNORMAL HIGH (ref 65–99)
Potassium: 5.5 mmol/L — ABNORMAL HIGH (ref 3.5–5.3)
Sodium: 138 mmol/L (ref 135–146)
eGFR: 89 mL/min/{1.73_m2} (ref >=60)

## 2022-05-31 ENCOUNTER — Other Ambulatory Visit: Payer: Self-pay

## 2022-05-31 DIAGNOSIS — E118 Type 2 diabetes mellitus with unspecified complications: Secondary | ICD-10-CM

## 2022-05-31 DIAGNOSIS — R110 Nausea: Secondary | ICD-10-CM

## 2022-06-01 ENCOUNTER — Other Ambulatory Visit: Payer: Self-pay | Admitting: *Deleted

## 2022-06-01 DIAGNOSIS — R899 Unspecified abnormal finding in specimens from other organs, systems and tissues: Secondary | ICD-10-CM

## 2022-06-08 DIAGNOSIS — R110 Nausea: Secondary | ICD-10-CM | POA: Diagnosis not present

## 2022-06-08 DIAGNOSIS — E118 Type 2 diabetes mellitus with unspecified complications: Secondary | ICD-10-CM | POA: Diagnosis not present

## 2022-06-09 LAB — BASIC METABOLIC PANEL
BUN: 13 mg/dL (ref 7–25)
CO2: 28 mmol/L (ref 20–32)
Calcium: 9.2 mg/dL (ref 8.6–10.4)
Chloride: 102 mmol/L (ref 98–110)
Creat: 0.78 mg/dL (ref 0.50–1.03)
Glucose, Bld: 171 mg/dL — ABNORMAL HIGH (ref 65–99)
Potassium: 4.7 mmol/L (ref 3.5–5.3)
Sodium: 137 mmol/L (ref 135–146)

## 2022-06-09 NOTE — Progress Notes (Signed)
Your lab work is within acceptable range and there are no concerning findings.   ?

## 2022-07-27 DIAGNOSIS — R109 Unspecified abdominal pain: Secondary | ICD-10-CM | POA: Diagnosis not present

## 2022-07-27 DIAGNOSIS — Z79899 Other long term (current) drug therapy: Secondary | ICD-10-CM | POA: Diagnosis not present

## 2022-07-27 DIAGNOSIS — Z888 Allergy status to other drugs, medicaments and biological substances status: Secondary | ICD-10-CM | POA: Diagnosis not present

## 2022-07-27 DIAGNOSIS — N189 Chronic kidney disease, unspecified: Secondary | ICD-10-CM | POA: Diagnosis not present

## 2022-07-27 DIAGNOSIS — N1339 Other hydronephrosis: Secondary | ICD-10-CM | POA: Diagnosis not present

## 2022-07-27 DIAGNOSIS — E1122 Type 2 diabetes mellitus with diabetic chronic kidney disease: Secondary | ICD-10-CM | POA: Diagnosis not present

## 2022-07-27 DIAGNOSIS — Z880 Allergy status to penicillin: Secondary | ICD-10-CM | POA: Diagnosis not present

## 2022-07-27 DIAGNOSIS — K5730 Diverticulosis of large intestine without perforation or abscess without bleeding: Secondary | ICD-10-CM | POA: Diagnosis not present

## 2022-07-27 DIAGNOSIS — K449 Diaphragmatic hernia without obstruction or gangrene: Secondary | ICD-10-CM | POA: Diagnosis not present

## 2022-07-27 DIAGNOSIS — Z881 Allergy status to other antibiotic agents status: Secondary | ICD-10-CM | POA: Diagnosis not present

## 2022-07-27 DIAGNOSIS — N132 Hydronephrosis with renal and ureteral calculous obstruction: Secondary | ICD-10-CM | POA: Diagnosis not present

## 2022-07-27 DIAGNOSIS — N3289 Other specified disorders of bladder: Secondary | ICD-10-CM | POA: Diagnosis not present

## 2022-08-02 ENCOUNTER — Telehealth: Payer: Self-pay

## 2022-08-02 DIAGNOSIS — E118 Type 2 diabetes mellitus with unspecified complications: Secondary | ICD-10-CM

## 2022-08-02 MED ORDER — TIRZEPATIDE 2.5 MG/0.5ML ~~LOC~~ SOAJ: 2.5000 mg | SUBCUTANEOUS | 0 refills | Status: DC

## 2022-08-02 NOTE — Telephone Encounter (Signed)
Pt informed.  Pt expressed understanding and is agreeable.  T. Prudie Guthridge, CMA  

## 2022-08-02 NOTE — Telephone Encounter (Signed)
Will dec dose back to 2.'5mg'$ .  Continues to have symptoms after to reducing the dose then please come in for an appointment.  Meds ordered this encounter  Medications   tirzepatide (MOUNJARO) 2.5 MG/0.5ML Pen    Sig: Inject 2.5 mg into the skin once a week.    Dispense:  6 mL    Refill:  0

## 2022-08-02 NOTE — Telephone Encounter (Signed)
Patient called to let you know that this week every morning she has been very sick to her stomach with pains and everything else since started taking the [facility].  She wants to know if she needs to come off of this medication or lower the dosage.

## 2022-08-21 ENCOUNTER — Telehealth: Payer: Self-pay

## 2022-08-21 ENCOUNTER — Telehealth: Payer: Self-pay | Admitting: *Deleted

## 2022-08-21 NOTE — Telephone Encounter (Signed)
Key, can you call the insurance and see what they need since patient had difficulty tolerating the higher dose so we went back down with the hope that she might be able to go up in a couple of months.  But we just, have to adjust it a little bit more slowly.

## 2022-08-21 NOTE — Telephone Encounter (Signed)
Initiated Prior authorization for [employer] 2.'5MG'$ /0.5ML pen-injectors Via: Covermymeds Case/Key:[id number] Status: approved as of 08/21/22 Reason:Effective from 08/21/2022 through 08/20/2023. Notified Pt via: Mychart

## 2022-08-21 NOTE — Telephone Encounter (Signed)
Pt lvm stating that her insurance will not cover the new dosage of Mounjaro 2.5 mg weekly. She was switched due to stomach upset and pains.   They are requiring an explanation as to why the dosage went down in order to cover this medication.  Will fwd to pcp

## 2022-09-04 ENCOUNTER — Encounter: Payer: Self-pay | Admitting: Family Medicine

## 2022-09-04 ENCOUNTER — Ambulatory Visit: Payer: BC Managed Care – PPO | Admitting: Family Medicine

## 2022-09-04 VITALS — BP 125/72 | HR 90 | Wt 194.0 lb

## 2022-09-04 DIAGNOSIS — Z23 Encounter for immunization: Secondary | ICD-10-CM

## 2022-09-04 DIAGNOSIS — N200 Calculus of kidney: Secondary | ICD-10-CM

## 2022-09-04 DIAGNOSIS — E118 Type 2 diabetes mellitus with unspecified complications: Secondary | ICD-10-CM | POA: Diagnosis not present

## 2022-09-04 LAB — POCT GLYCOSYLATED HEMOGLOBIN (HGB A1C): Hemoglobin A1C: 7.7 % — AB (ref 4.0–5.6)

## 2022-09-04 NOTE — Assessment & Plan Note (Signed)
She is feeling better

## 2022-09-04 NOTE — Progress Notes (Signed)
   Established Patient Office Visit  Subjective   Patient ID: Nicole Mata, female    DOB: [birth year]  Age: 61 y.o. MRN: [id number]  Chief Complaint  Patient presents with   Diabetes    HPI  Diabetes - no hypoglycemic events. No wounds or sores that are not healing well. No increased thirst or urination. Checking glucose at home. Taking medications as prescribed without any side effects.  We were finally able to get the [facility] approved as of 9/19.  She did well with it previously though she did get quite nauseated at the 10 mg dose.  She would like a refill on the Zofran sometimes she will just use Dramamine though and that seems to help with the nausea just last a couple days right after the injection.  She has tried to maintain her current weight.  She was also seen in the emergency department last month for a kidney stone.  She presented with left-sided flank pain.  He was discharged home on Toradol, Flomax and Zofran.    ROS    Objective:     BP 125/72   Pulse 90   Wt 194 lb (88 kg)   LMP 12/04/2013   SpO2 97%   BMI 30.38 kg/m    Physical Exam Vitals and nursing note reviewed.  Constitutional:      Appearance: She is well-developed.  HENT:     Head: Normocephalic and atraumatic.  Cardiovascular:     Rate and Rhythm: Normal rate and regular rhythm.     Heart sounds: Normal heart sounds.  Pulmonary:     Effort: Pulmonary effort is normal.     Breath sounds: Normal breath sounds.  Skin:    General: Skin is warm and dry.  Neurological:     Mental Status: She is alert and oriented to person, place, and time.  Psychiatric:        Behavior: Behavior normal.      Results for orders placed or performed in visit on 09/04/22  POCT glycosylated hemoglobin (Hb A1C)  Result Value Ref Range   Hemoglobin A1C 7.7 (A) 4.0 - 5.6 %   HbA1c POC (<> result, manual entry)     HbA1c, POC (prediabetic range)     HbA1c, POC (controlled diabetic range)        The 10-year  ASCVD risk score (Arnett DK, et al., 2019) is: 5.4%    Assessment & Plan:   Problem List Items Addressed This Visit       Endocrine   Controlled diabetes mellitus type 2 with complications ([place]) - Primary    A1c not quite at goal today but it does look better at 7.7.  She was unable to restart the [facility] last Thursday after being off of it for several months because of insurance issues.  She will plan to go up to the next dose after 1 month.      Relevant Orders   POCT glycosylated hemoglobin (Hb A1C) (Completed)     Genitourinary   Kidney stones    She is feeling better.       Other Visit Diagnoses     Need for immunization against influenza       Relevant Orders   Flu Vaccine QUAD 31moIM (Fluarix, Fluzone & Alfiuria Quad PF) (Completed)       Return in about 14 weeks (around 12/11/2022) for Diabetes follow-up.    CBeatrice Lecher MD

## 2022-09-04 NOTE — Patient Instructions (Signed)
Schedule your mammogram when you are able.

## 2022-09-04 NOTE — Assessment & Plan Note (Signed)
A1c not quite at goal today but it does look better at 7.7.  She was unable to restart the [facility] last Thursday after being off of it for several months because of insurance issues.  She will plan to go up to the next dose after 1 month.

## 2022-10-04 ENCOUNTER — Telehealth: Payer: Self-pay

## 2022-10-04 NOTE — Telephone Encounter (Signed)
Initiated Prior authorization NHR:[employer] 2.'5MG'$ /0.5ML pen-injectors Via: Covermymeds Case/Key:[id number]  Status: approved as of 10/04/22 Reason:Effective from 10/04/2022 through 10/03/2023. Notified Pt via: Mychart

## 2022-11-06 ENCOUNTER — Telehealth: Payer: Self-pay | Admitting: *Deleted

## 2022-11-06 DIAGNOSIS — E118 Type 2 diabetes mellitus with unspecified complications: Secondary | ICD-10-CM

## 2022-11-06 MED ORDER — TIRZEPATIDE 2.5 MG/0.5ML ~~LOC~~ SOAJ: 2.5000 mg | SUBCUTANEOUS | 0 refills | Status: DC

## 2022-11-06 NOTE — Telephone Encounter (Addendum)
Pt called and LVM stating that she is still having issues with obtaining her Mounjaro at the lower dose. She said that her BS yesterday was 375 and she was able to get it down to 186 but it was up to 206 this morning.   She stated that the pharmacy cancelled the Rx for the higher dosage and she would like to know what she should do.   She said that we can either call or send a mychart message about this.

## 2022-11-06 NOTE — Telephone Encounter (Signed)
Meds ordered this encounter  Medications   tirzepatide (MOUNJARO) 2.5 MG/0.5ML Pen    Sig: Inject 2.5 mg into the skin once a week.    Dispense:  6 mL    Refill:  0

## 2022-11-06 NOTE — Telephone Encounter (Signed)
Called pt and informed her that the [facility] was sent to her pharmacy.   She states that [employer] will only pay for her to get the 2.5 mg once every 6 months. And that when she spoke to the insurance people she was told that we would need to fill out a form that would allow her to get this on a regular basis otherwise she would have to pay $1500 a month for the [facility].   Currently taking metformin 1000 mg qd.   I asked if she knew which DM medications are covered under her plan she states that she would need to look online for this and call back.    Advised that she look at her medication list for GLP-1 to see what is covered    Will d/c the prescription for Mounjaro 2.5 mg

## 2022-11-07 NOTE — Telephone Encounter (Signed)
Called pt to inform them that  they are maxed  out on prior authorizations, pt currently have 3 prior authorizations on file for monjaro 2.5 mg injection, Per [employer] [place], pt must either titrate up to 5 mg and remain on that dose for 4 weeks and titrate up until pt reaches the max dose 15 mg,I was also informed that pharmacy has been running the claim for 2.5 mg for 6 ml for 84 days the corrected amount should be 2 ml for  28 days  in order  for the claim to process, however  this claim has been blocked due to maxing out the amount of times the pt can remain on the 2.5 dose.  Provider suggested  the pt stay on the medication and titrate up  to 5 mg with a fill on Zofran for nausea  or to change the medication  due to pt sensitivity to the higher dose.  Pt was informed about this and states that she does not feel comfortable to titrate up in monjaro she would like to try something else  Covered alternative include the following medication: Januvia Symlinpen Metformin  Glipzide Glyburide Aloglipton Saxaglipton Janumet/XR Trulicity Victoza Ozempic Rybelsus Farxiga Jardiance Invokana Steglato Xigduo xr Synjardy Invokamet/xr Trijardy xr

## 2022-11-10 ENCOUNTER — Telehealth: Payer: Self-pay

## 2022-11-10 NOTE — Telephone Encounter (Signed)
[place] states her blood glucose 311 mg/dl. She only takes Metformin. Her insurance will not cover the dose of the [facility] that she can take. She is requesting a different medication.   She has a virtual visit on Monday to discuss elevated blood glucose.     Covered alternative include the following medication: Januvia Symlinpen Metformin  Glipzide Glyburide Aloglipton Saxaglipton Janumet/XR Trulicity Victoza Ozempic Rybelsus Farxiga Jardiance Invokana Steglato Xigduo xr Synjardy Invokamet/xr Trijardy xr

## 2022-11-13 ENCOUNTER — Telehealth (INDEPENDENT_AMBULATORY_CARE_PROVIDER_SITE_OTHER): Payer: BC Managed Care – PPO | Admitting: Family Medicine

## 2022-11-13 DIAGNOSIS — E118 Type 2 diabetes mellitus with unspecified complications: Secondary | ICD-10-CM | POA: Diagnosis not present

## 2022-11-13 MED ORDER — RYBELSUS 3 MG PO TABS: 3.0000 mg | ORAL_TABLET | Freq: Every day | ORAL | 0 refills | Status: DC

## 2022-11-13 NOTE — Progress Notes (Signed)
    Virtual Visit via Video Note  I connected with [name] on 11/13/22 at  1:00 PM EST by a video enabled telemedicine application and verified that I am speaking with the correct person using two identifiers.   I discussed the limitations of evaluation and management by telemedicine and the availability of in person appointments. The patient expressed understanding and agreed to proceed.  Patient location: at home Provider location: in office  Subjective:    CC:   Chief Complaint  Patient presents with   Diabetes    HPI: Glucose up in the 300s over the last 2 weeks.  Only taking metformin.  Fortunately she had to come off of the [facility] she was unable to take the higher strength and so the insurance quit paying for the medication because we had to go back down.  So she wonders what her alternative options are she has already tried several other medications in the past including Invokana, Januvia.   Past medical history, Surgical history, Family history not pertinant except as noted below, Social history, Allergies, and medications have been entered into the medical record, reviewed, and corrections made.    Objective:    General: Speaking clearly in complete sentences without any shortness of breath.  Alert and oriented x3.  Normal judgment. No apparent acute distress.    Impression and Recommendations:    Problem List Items Addressed This Visit       Endocrine   Controlled diabetes mellitus type 2 with complications ([place]) - Primary    It looks like her insurance will cover Rybelsus organ to start with 3 mg and we will continue to titrate as tolerated hopefully should be able to take the higher strength of this medication to improve her blood glucose numbers in addition to help manage her weight.  If this is not successful then we will consider switching to Trulicity which I believe she has not tried yet since there is less GI intolerance with that particular 1.   Otherwise keep regularly scheduled follow-up for next A1c.      Relevant Medications   Semaglutide (RYBELSUS) 3 MG TABS    No orders of the defined types were placed in this encounter.   Meds ordered this encounter  Medications   Semaglutide (RYBELSUS) 3 MG TABS    Sig: Take 3 mg by mouth daily.    Dispense:  30 tablet    Refill:  0     I discussed the assessment and treatment plan with the patient. The patient was provided an opportunity to ask questions and all were answered. The patient agreed with the plan and demonstrated an understanding of the instructions.   The patient was advised to call back or seek an in-person evaluation if the symptoms worsen or if the condition fails to improve as anticipated.   Nicole Lecher, MD   I spent 12 minutes on the day of the encounter to include pre-visit record review, face-to-face time with the patient and post visit ordering of test.

## 2022-11-13 NOTE — Assessment & Plan Note (Signed)
It looks like her insurance will cover Rybelsus organ to start with 3 mg and we will continue to titrate as tolerated hopefully should be able to take the higher strength of this medication to improve her blood glucose numbers in addition to help manage her weight.  If this is not successful then we will consider switching to Trulicity which I believe she has not tried yet since there is less GI intolerance with that particular 1.  Otherwise keep regularly scheduled follow-up for next A1c.

## 2022-11-13 NOTE — Progress Notes (Signed)
LVM

## 2022-12-12 ENCOUNTER — Encounter: Payer: Self-pay | Admitting: Family Medicine

## 2022-12-12 ENCOUNTER — Ambulatory Visit: Payer: BC Managed Care – PPO | Admitting: Family Medicine

## 2022-12-12 VITALS — BP 138/71 | HR 85 | Ht 67.0 in | Wt 192.0 lb

## 2022-12-12 DIAGNOSIS — E7800 Pure hypercholesterolemia, unspecified: Secondary | ICD-10-CM

## 2022-12-12 DIAGNOSIS — E118 Type 2 diabetes mellitus with unspecified complications: Secondary | ICD-10-CM | POA: Diagnosis not present

## 2022-12-12 DIAGNOSIS — M545 Low back pain, unspecified: Secondary | ICD-10-CM

## 2022-12-12 LAB — POCT GLYCOSYLATED HEMOGLOBIN (HGB A1C): Hemoglobin A1C: 9.5 % — AB (ref 4.0–5.6)

## 2022-12-12 MED ORDER — RYBELSUS 7 MG PO TABS: 7.0000 mg | ORAL_TABLET | Freq: Every day | ORAL | 0 refills | Status: DC

## 2022-12-12 MED ORDER — RYBELSUS 14 MG PO TABS: 14.0000 mg | ORAL_TABLET | Freq: Every day | ORAL | 1 refills | Status: DC

## 2022-12-12 NOTE — Progress Notes (Signed)
She reports that she is doing well on Rybelsus 3 mg. She is still taking Metformin 500 mg  (1,000 mg total).  She also c/o some L sided flank pain that is sore to the touch and has some pain w/urination. Denies any f/s/c/n/v no blood or odor.

## 2022-12-12 NOTE — Assessment & Plan Note (Signed)
Given for stretches to do on her own at home.  Also given a handout for back injury prevention.

## 2022-12-12 NOTE — Assessment & Plan Note (Signed)
A1c up significantly from previous.  But she is also been off the [facility].  We recently just started Rybelsus.  Will increase dose to 7 mg and then in 1 month we will go up to 14 as long as she continues to tolerate it well.  Just had a little bit of mild nausea.  Discussed the severely getting back on track with healthy diet and portion controlling.  Encouraged her to really work on increasing her vegetable intake as well as portion control and carbs and sweets.  Also encouraged her to work on regular exercise.

## 2022-12-12 NOTE — Progress Notes (Signed)
Established Patient Office Visit  Subjective   Patient ID: Nicole Mata, female    DOB: Jan 25, 1961  Age: 62 y.o. MRN: [id number]  Chief Complaint  Patient presents with   Diabetes    HPI Diabetes - no hypoglycemic events. No wounds or sores that are not healing well. No increased thirst or urination. Checking glucose at home. Taking medications as prescribed without any side effects. Recently started Rybelsus after her insurance no longer covered Mounjaro.  She feels like since moving to a second story house she has been a little bit more active and in fact she has lost some weight.  She is down about 2 pounds from when I saw her a couple of months ago.  Is also had some left low back pain.  She feels like its lower than where she has previously had her kidney stone.  No radiation.  Reports it is tender to touch.    ROS    Objective:     BP 138/71   Pulse 85   Ht '5\' 7"'$  (1.702 m)   Wt 192 lb (87.1 kg)   LMP 12/04/2013   SpO2 98%   BMI 30.07 kg/m    Physical Exam Vitals and nursing note reviewed.  Constitutional:      Appearance: She is well-developed.  HENT:     Head: Normocephalic and atraumatic.  Cardiovascular:     Rate and Rhythm: Normal rate and regular rhythm.     Heart sounds: Normal heart sounds.  Pulmonary:     Effort: Pulmonary effort is normal.     Breath sounds: Normal breath sounds.  Musculoskeletal:     Comments: Tender over the left low back.  Skin:    General: Skin is warm and dry.  Neurological:     Mental Status: She is alert and oriented to person, place, and time.  Psychiatric:        Behavior: Behavior normal.      Results for orders placed or performed in visit on 12/12/22  POCT glycosylated hemoglobin (Hb A1C)  Result Value Ref Range   Hemoglobin A1C 9.5 (A) 4.0 - 5.6 %   HbA1c POC (<> result, manual entry)     HbA1c, POC (prediabetic range)     HbA1c, POC (controlled diabetic range)        The 10-year ASCVD risk score  (Arnett DK, et al., 2019) is: 6.5%    Assessment & Plan:   Problem List Items Addressed This Visit       Endocrine   Controlled diabetes mellitus type 2 with complications ([place]) - Primary    A1c up significantly from previous.  But she is also been off the [facility].  We recently just started Rybelsus.  Will increase dose to 7 mg and then in 1 month we will go up to 14 as long as she continues to tolerate it well.  Just had a little bit of mild nausea.  Discussed the severely getting back on track with healthy diet and portion controlling.  Encouraged her to really work on increasing her vegetable intake as well as portion control and carbs and sweets.  Also encouraged her to work on regular exercise.      Relevant Medications   Semaglutide (RYBELSUS) 7 MG TABS   Semaglutide (RYBELSUS) 14 MG TABS (Start on 01/09/2023)   Other Relevant Orders   POCT glycosylated hemoglobin (Hb A1C) (Completed)   Lipid Panel w/reflex Direct LDL   COMPLETE METABOLIC PANEL WITH GFR  Other   Low back pain    Given for stretches to do on her own at home.  Also given a handout for back injury prevention.      Hyperlipidemia   Relevant Orders   POCT glycosylated hemoglobin (Hb A1C) (Completed)   Lipid Panel w/reflex Direct LDL   COMPLETE METABOLIC PANEL WITH GFR    Return in about 3 months (around 03/13/2023) for Diabetes follow-up.    Beatrice Lecher, MD

## 2022-12-13 LAB — COMPLETE METABOLIC PANEL WITH GFR
AG Ratio: 1.3 (calc) (ref 1.0–2.5)
ALT: 17 U/L (ref 6–29)
AST: 19 U/L (ref 10–35)
Albumin: 4.2 g/dL (ref 3.6–5.1)
Alkaline phosphatase (APISO): 79 U/L (ref 37–153)
BUN: 13 mg/dL (ref 7–25)
CO2: 31 mmol/L (ref 20–32)
Calcium: 9.8 mg/dL (ref 8.6–10.4)
Chloride: 103 mmol/L (ref 98–110)
Creat: 0.67 mg/dL (ref 0.50–1.03)
Globulin: 3.2 g/dL (calc) (ref 1.9–3.7)
Glucose, Bld: 176 mg/dL — ABNORMAL HIGH (ref 65–99)
Potassium: 4.5 mmol/L (ref 3.5–5.3)
Sodium: 141 mmol/L (ref 135–146)
Total Bilirubin: 0.4 mg/dL (ref 0.2–1.2)
Total Protein: 7.4 g/dL (ref 6.1–8.1)
eGFR: 101 mL/min/{1.73_m2} (ref >=60)

## 2022-12-13 LAB — LIPID PANEL W/REFLEX DIRECT LDL
Cholesterol: 179 mg/dL (ref <200)
HDL: 53 mg/dL (ref >=50)
LDL Cholesterol (Calc): 98 mg/dL (calc)
Non-HDL Cholesterol (Calc): 126 mg/dL (calc) (ref <130)
Total CHOL/HDL Ratio: 3.4 (calc) (ref <5.0)
Triglycerides: 188 mg/dL — ABNORMAL HIGH (ref <150)

## 2022-12-13 NOTE — Progress Notes (Signed)
Hi Nicole Mata, LDL cholesterol is under 100 this year, great work!  Your triglycerides are up a little bit though just continue to work on healthy food choices and regular exercise.  To really increase vegetable intake.  Kidney and liver function are good.

## 2022-12-14 ENCOUNTER — Encounter: Payer: Self-pay | Admitting: Family Medicine

## 2022-12-24 DIAGNOSIS — M542 Cervicalgia: Secondary | ICD-10-CM | POA: Diagnosis not present

## 2022-12-24 DIAGNOSIS — M62838 Other muscle spasm: Secondary | ICD-10-CM | POA: Diagnosis not present

## 2022-12-24 DIAGNOSIS — M549 Dorsalgia, unspecified: Secondary | ICD-10-CM | POA: Diagnosis not present

## 2022-12-24 DIAGNOSIS — M436 Torticollis: Secondary | ICD-10-CM | POA: Diagnosis not present

## 2023-02-07 ENCOUNTER — Encounter: Payer: Self-pay | Admitting: Family Medicine

## 2023-02-07 ENCOUNTER — Telehealth (INDEPENDENT_AMBULATORY_CARE_PROVIDER_SITE_OTHER): Payer: BC Managed Care – PPO | Admitting: Family Medicine

## 2023-02-07 DIAGNOSIS — J0100 Acute maxillary sinusitis, unspecified: Secondary | ICD-10-CM

## 2023-02-07 MED ORDER — DOXYCYCLINE HYCLATE 100 MG PO TABS: 100.0000 mg | ORAL_TABLET | Freq: Two times a day (BID) | ORAL | 0 refills | Status: DC

## 2023-02-07 NOTE — Progress Notes (Signed)
    Virtual Visit via Video Note  I connected with Nicole Mata on 02/07/23 at 11:30 AM EST by a video enabled telemedicine application and verified that I am speaking with the correct person using two identifiers.   I discussed the limitations of evaluation and management by telemedicine and the availability of in person appointments. The patient expressed understanding and agreed to proceed.  Patient location: at home Provider location: in office  Subjective:    CC:   Chief Complaint  Patient presents with   Sinus Problem    HPI: Sinus sxs x 1 week with congestion.started with drainage. Using OTC nasal spray.  Then yesterday feel worse.  Ears starting to feel full.  No fever. No home COVID test.  No GI sxs. Some mild cough. Some maxillary pressure   Past medical history, Surgical history, Family history not pertinant except as noted below, Social history, Allergies, and medications have been entered into the medical record, reviewed, and corrections made.    Objective:    General: Speaking clearly in complete sentences without any shortness of breath.  Alert and oriented x3.  Normal judgment. No apparent acute distress.    Impression and Recommendations:    Problem List Items Addressed This Visit   None Visit Diagnoses     Acute non-recurrent maxillary sinusitis    -  Primary   Relevant Medications   doxycycline (VIBRA-TABS) 100 MG tablet       Acute sinusitis - will tx wit doxy. Continue symptomatic care. Call if not better in one wee.   No orders of the defined types were placed in this encounter.   Meds ordered this encounter  Medications   doxycycline (VIBRA-TABS) 100 MG tablet    Sig: Take 1 tablet (100 mg total) by mouth 2 (two) times daily.    Dispense:  14 tablet    Refill:  0   I discussed the assessment and treatment plan with the patient. The patient was provided an opportunity to ask questions and all were answered. The patient agreed with the  plan and demonstrated an understanding of the instructions.   The patient was advised to call back or seek an in-person evaluation if the symptoms worsen or if the condition fails to improve as anticipated.   Beatrice Lecher, MD

## 2023-03-02 ENCOUNTER — Other Ambulatory Visit: Payer: Self-pay | Admitting: Family Medicine

## 2023-03-02 DIAGNOSIS — E118 Type 2 diabetes mellitus with unspecified complications: Secondary | ICD-10-CM

## 2023-03-12 ENCOUNTER — Encounter: Payer: Self-pay | Admitting: Family Medicine

## 2023-03-12 ENCOUNTER — Ambulatory Visit (INDEPENDENT_AMBULATORY_CARE_PROVIDER_SITE_OTHER): Payer: BC Managed Care – PPO | Admitting: Family Medicine

## 2023-03-12 VITALS — BP 117/65 | HR 83 | Ht 67.0 in | Wt 186.0 lb

## 2023-03-12 DIAGNOSIS — R350 Frequency of micturition: Secondary | ICD-10-CM

## 2023-03-12 DIAGNOSIS — E118 Type 2 diabetes mellitus with unspecified complications: Secondary | ICD-10-CM | POA: Diagnosis not present

## 2023-03-12 DIAGNOSIS — M545 Low back pain, unspecified: Secondary | ICD-10-CM

## 2023-03-12 DIAGNOSIS — M25521 Pain in right elbow: Secondary | ICD-10-CM

## 2023-03-12 DIAGNOSIS — M5416 Radiculopathy, lumbar region: Secondary | ICD-10-CM | POA: Diagnosis not present

## 2023-03-12 DIAGNOSIS — M25531 Pain in right wrist: Secondary | ICD-10-CM

## 2023-03-12 LAB — POCT URINALYSIS DIP (CLINITEK)
Bilirubin, UA: NEGATIVE
Blood, UA: NEGATIVE
Glucose, UA: NEGATIVE mg/dL
Ketones, POC UA: NEGATIVE mg/dL
Leukocytes, UA: NEGATIVE
Nitrite, UA: NEGATIVE
POC PROTEIN,UA: NEGATIVE
Spec Grav, UA: >=1.03 — AB (ref 1.010–1.025)
Urobilinogen, UA: 0.2 E.U./dL
pH, UA: 5 (ref 5.0–8.0)

## 2023-03-12 LAB — POCT GLYCOSYLATED HEMOGLOBIN (HGB A1C): Hemoglobin A1C: 7.1 % — AB (ref 4.0–5.6)

## 2023-03-12 NOTE — Progress Notes (Signed)
Established Patient Office Visit  Subjective   Patient ID: TARANIKA MARRA, female    DOB: [birth year]  Age: 62 y.o. MRN: [id number]  Chief Complaint  Patient presents with   Diabetes   uti symptoms    Symptoms started on saturday    HPI   Diabetes - no hypoglycemic events. No wounds or sores that are not healing well. No increased thirst or urination. Checking glucose at home. Taking medications as prescribed without any side effects.Currently on Rybelsus and metformin. Down 6 lbs.   3 days of low back pain some pelvic pressure. No hematuria or dysuria.    Also having low back pain with pain radiating to her right upper hip towards the iliac crest and then around to her anterior lateral right thigh towards the knee.  It does not past the knee.  She cannot take anti-inflammatories.  He is also been having some right wrist, right elbow pain rating up into her upper arm.  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    ROS    Objective:     BP 117/65   Pulse 83   Ht 5\' 7"  (1.702 m)   Wt 186 lb (84.4 kg)   LMP 12/04/2013   SpO2 97%   BMI 29.13 kg/m    Physical Exam Vitals and nursing note reviewed.  Constitutional:      Appearance: She is well-developed.  HENT:     Head: Normocephalic and atraumatic.  Pulmonary:     Effort: Pulmonary effort is normal.  Musculoskeletal:     Comments: Lumbar flexion, extension, rotation right and left, sidebending.  She did have some discomfort in the right low back with side bending.  Negative straight leg raise.  Hip, knee, ankle strength is 5 out of 5 bilaterally.  Skin:    General: Skin is warm and dry.  Neurological:     Mental Status: She is alert and oriented to person, place, and time.  Psychiatric:        Behavior: Behavior normal.      Results for orders placed or performed in visit on 03/12/23  POCT  glycosylated hemoglobin (Hb A1C)  Result Value Ref Range   Hemoglobin A1C 7.1 (A) 4.0 - 5.6 %   HbA1c POC (<> result, manual entry)     HbA1c, POC (prediabetic range)     HbA1c, POC (controlled diabetic range)    POCT URINALYSIS DIP (CLINITEK)  Result Value Ref Range   Color, UA yellow yellow   Clarity, UA cloudy (A) clear   Glucose, UA negative negative mg/dL   Bilirubin, UA negative negative   Ketones, POC UA negative negative mg/dL   Spec Grav, UA >=5.993 (A) 1.010 - 1.025   Blood, UA negative negative   pH, UA 5.0 5.0 - 8.0   POC PROTEIN,UA negative negative, trace   Urobilinogen, UA 0.2 0.2 or 1.0 E.U./dL   Nitrite, UA Negative Negative   Leukocytes, UA Negative Negative      The 10-year ASCVD risk score (Arnett DK, et al., 2019) is: 4.5%    Assessment & Plan:   Problem List Items Addressed This Visit       Endocrine   Controlled diabetes mellitus type 2 with complications - Primary    A1C ois back down to 7.1 from 9.5. she is doing great. Really  back on track. F/U in 3-4 months.        Relevant Orders   POCT glycosylated hemoglobin (Hb A1C) (Completed)     Other   Low back pain   Relevant Orders   POCT URINALYSIS DIP (CLINITEK) (Completed)   Other Visit Diagnoses     Lumbar radiculopathy, right       Urinary frequency       Relevant Orders   Urine Culture   Right elbow pain       Right wrist pain           Low back pain with right-sided radiculopathy-discussed options given handout to do at home on her own she has a lot going on.  If not improving consider formal physical therapy or getting back in with her spine doctor if not improving.  He would also benefit greatly from a sit/stand desk I think that would help get a lot of pressure off her lumbar spine.  Right wrist right elbow pain-discussed using a more ergonomic mouse.  I do think it is probably related to prolonged hours working on the computer and should improve as she is able to decrease  that.  Pelvic pressure-urinalysis is clear but will send for culture to rule out UTI.  Return in about 4 months (around 07/12/2023) for Diabetes follow-up.    Nani Gasser, MD

## 2023-03-12 NOTE — Assessment & Plan Note (Signed)
A1C ois back down to 7.1 from 9.5. she is doing great. Really back on track. F/U in 3-4 months.

## 2023-03-13 LAB — URINE CULTURE
MICRO NUMBER:: 14794774
Result:: NO GROWTH
SPECIMEN QUALITY:: ADEQUATE

## 2023-03-13 LAB — EXTRA URINE SPECIMEN

## 2023-03-14 NOTE — Progress Notes (Signed)
Hi Nicole Mata, your urine culture is negative.

## 2023-03-22 DIAGNOSIS — M7061 Trochanteric bursitis, right hip: Secondary | ICD-10-CM | POA: Diagnosis not present

## 2023-03-22 DIAGNOSIS — M25551 Pain in right hip: Secondary | ICD-10-CM | POA: Diagnosis not present

## 2023-04-13 ENCOUNTER — Other Ambulatory Visit: Payer: Self-pay | Admitting: Family Medicine

## 2023-04-13 DIAGNOSIS — Z1231 Encounter for screening mammogram for malignant neoplasm of breast: Secondary | ICD-10-CM

## 2023-04-20 ENCOUNTER — Telehealth: Payer: BC Managed Care – PPO | Admitting: Family Medicine

## 2023-05-01 ENCOUNTER — Ambulatory Visit (HOSPITAL_BASED_OUTPATIENT_CLINIC_OR_DEPARTMENT_OTHER)
Admission: RE | Admit: 2023-05-01 | Discharge: 2023-05-01 | Disposition: A | Discharge status: Home / Self Care | Payer: BC Managed Care – PPO | Source: Ambulatory Visit | Attending: Family Medicine | Admitting: Family Medicine

## 2023-05-01 ENCOUNTER — Encounter (HOSPITAL_BASED_OUTPATIENT_CLINIC_OR_DEPARTMENT_OTHER): Payer: Self-pay

## 2023-05-01 DIAGNOSIS — Z1231 Encounter for screening mammogram for malignant neoplasm of breast: Secondary | ICD-10-CM | POA: Diagnosis not present

## 2023-05-03 NOTE — Progress Notes (Signed)
Please call patient. Normal mammogram.  Repeat in 1 year.  

## 2023-05-14 ENCOUNTER — Encounter (INDEPENDENT_AMBULATORY_CARE_PROVIDER_SITE_OTHER): Payer: BC Managed Care – PPO | Admitting: Family Medicine

## 2023-05-14 DIAGNOSIS — M7061 Trochanteric bursitis, right hip: Secondary | ICD-10-CM | POA: Diagnosis not present

## 2023-05-14 DIAGNOSIS — E118 Type 2 diabetes mellitus with unspecified complications: Secondary | ICD-10-CM

## 2023-05-14 DIAGNOSIS — Z7984 Long term (current) use of oral hypoglycemic drugs: Secondary | ICD-10-CM

## 2023-05-14 DIAGNOSIS — M25512 Pain in left shoulder: Secondary | ICD-10-CM | POA: Diagnosis not present

## 2023-05-14 MED ORDER — RYBELSUS 3 MG PO TABS
3.0000 mg | ORAL_TABLET | Freq: Every day | ORAL | 3 refills | Status: DC
Start: 2023-05-14 — End: 2023-08-23

## 2023-05-14 NOTE — Telephone Encounter (Signed)
I spent 5 total minutes of online digital evaluation and management services in this patient-initiated request for online care. 

## 2023-05-28 DIAGNOSIS — N132 Hydronephrosis with renal and ureteral calculous obstruction: Secondary | ICD-10-CM | POA: Diagnosis not present

## 2023-06-21 ENCOUNTER — Other Ambulatory Visit: Payer: Self-pay | Admitting: *Deleted

## 2023-06-21 MED ORDER — DEXILANT 60 MG PO CPDR: 60.0000 mg | DELAYED_RELEASE_CAPSULE | Freq: Every day | ORAL | 11 refills | Status: DC

## 2023-07-04 ENCOUNTER — Ambulatory Visit: Payer: BC Managed Care – PPO | Admitting: Podiatry

## 2023-07-09 ENCOUNTER — Encounter: Payer: Self-pay | Admitting: Family Medicine

## 2023-07-09 ENCOUNTER — Ambulatory Visit: Payer: BC Managed Care – PPO | Admitting: Family Medicine

## 2023-07-09 VITALS — BP 123/76 | HR 72 | Ht 67.0 in | Wt 194.0 lb

## 2023-07-09 DIAGNOSIS — M5432 Sciatica, left side: Secondary | ICD-10-CM | POA: Diagnosis not present

## 2023-07-09 DIAGNOSIS — E1142 Type 2 diabetes mellitus with diabetic polyneuropathy: Secondary | ICD-10-CM

## 2023-07-09 DIAGNOSIS — E7800 Pure hypercholesterolemia, unspecified: Secondary | ICD-10-CM

## 2023-07-09 DIAGNOSIS — E118 Type 2 diabetes mellitus with unspecified complications: Secondary | ICD-10-CM | POA: Diagnosis not present

## 2023-07-09 DIAGNOSIS — Z7984 Long term (current) use of oral hypoglycemic drugs: Secondary | ICD-10-CM

## 2023-07-09 LAB — POCT GLYCOSYLATED HEMOGLOBIN (HGB A1C): Hemoglobin A1C: 7.8 % — AB (ref 4.0–5.6)

## 2023-07-09 NOTE — Assessment & Plan Note (Signed)
A1C is elevated today and uncontrolled at 7.8.  She would like to try the Rybelsus 7 mg again.  We have tried it initially but she had significant GI effects.  But she is tolerated the Rybelsus 3 mg well for several months now so we will try it again and she will let me know if it goes well and if we can send in a new prescription.

## 2023-07-09 NOTE — Assessment & Plan Note (Signed)
On gabapentin.  

## 2023-07-09 NOTE — Progress Notes (Signed)
Established Patient Office Visit  Subjective   Patient ID: Nicole Mata, female    DOB: 07/22/1961  Age: 62 y.o. MRN: [id number]  Chief Complaint  Patient presents with   Diabetes    HPI She is under a lot of stress lately.  She says that her husband was recently diagnosed with prostate cancer but he is having issues with bladder obstruction and UTIs and so is been to the ED and hospitalized multiple times in the last month.  Also her mother fell and tore part of her rotator cuff tendon and so she is having to help her out on the weekends.  He is also been dealing with some left-sided sciatica that is radiating down to her knee that has been bothering her as well.  But she does have an orthopedist that she is working with.  Diabetes - no hypoglycemic events. No wounds or sores that are not healing well. No increased thirst or urination. Checking glucose at home. Taking medications as prescribed without any side effects.  Gently had a trochanteric bursa injection with steroids.    ROS    Objective:     BP 123/76   Pulse 72   Ht 5\' 7"  (1.702 m)   Wt 194 lb (88 kg)   LMP 12/04/2013   SpO2 97%   BMI 30.38 kg/m    Physical Exam Vitals and nursing note reviewed.  Constitutional:      Appearance: She is well-developed.  HENT:     Head: Normocephalic and atraumatic.  Cardiovascular:     Rate and Rhythm: Normal rate and regular rhythm.     Heart sounds: Normal heart sounds.  Pulmonary:     Effort: Pulmonary effort is normal.     Breath sounds: Normal breath sounds.  Skin:    General: Skin is warm and dry.  Neurological:     Mental Status: She is alert and oriented to person, place, and time.  Psychiatric:        Behavior: Behavior normal.      Results for orders placed or performed in visit on 07/09/23  POCT glycosylated hemoglobin (Hb A1C)  Result Value Ref Range   Hemoglobin A1C 7.8 (A) 4.0 - 5.6 %   HbA1c POC (<> result, manual entry)     HbA1c, POC  (prediabetic range)     HbA1c, POC (controlled diabetic range)        The 10-year ASCVD risk score (Arnett DK, et al., 2019) is: 5.5%    Assessment & Plan:   Problem List Items Addressed This Visit       Endocrine   Diabetic peripheral neuropathy (HCC)    On gabapentin.        Controlled diabetes mellitus type 2 with complications (HCC) - Primary    A1C is elevated today and uncontrolled at 7.8.  She would like to try the Rybelsus 7 mg again.  We have tried it initially but she had significant GI effects.  But she is tolerated the Rybelsus 3 mg well for several months now so we will try it again and she will let me know if it goes well and if we can send in a new prescription.      Relevant Orders   POCT glycosylated hemoglobin (Hb A1C) (Completed)   Urine Microalbumin w/creat. ratio   CMP14+EGFR     Other   Hyperlipidemia   Relevant Orders   Urine Microalbumin w/creat. ratio   CMP14+EGFR   Other Visit Diagnoses  Left sided sciatica          Left sided sciatica -offered to treat with a round of prednisone.  She said she will hold off for now but call me back if she decides to do it.  It typically causes insomnia and would definitely raise her blood sugars which are not well-controlled currently.  Return in about 3 months (around 10/16/2023) for Diabetes follow-up.    Nani Gasser, MD

## 2023-07-17 DIAGNOSIS — M7061 Trochanteric bursitis, right hip: Secondary | ICD-10-CM | POA: Diagnosis not present

## 2023-07-17 DIAGNOSIS — M50122 Cervical disc disorder at C5-C6 level with radiculopathy: Secondary | ICD-10-CM | POA: Diagnosis not present

## 2023-07-17 DIAGNOSIS — M50123 Cervical disc disorder at C6-C7 level with radiculopathy: Secondary | ICD-10-CM | POA: Diagnosis not present

## 2023-07-19 ENCOUNTER — Encounter: Payer: Self-pay | Admitting: Family Medicine

## 2023-07-26 ENCOUNTER — Encounter: Payer: Self-pay | Admitting: Family Medicine

## 2023-07-30 ENCOUNTER — Telehealth: Payer: Self-pay | Admitting: Family Medicine

## 2023-07-30 ENCOUNTER — Ambulatory Visit: Payer: BC Managed Care – PPO | Admitting: Podiatry

## 2023-07-30 DIAGNOSIS — E1142 Type 2 diabetes mellitus with diabetic polyneuropathy: Secondary | ICD-10-CM

## 2023-07-30 DIAGNOSIS — L909 Atrophic disorder of skin, unspecified: Secondary | ICD-10-CM | POA: Diagnosis not present

## 2023-07-30 DIAGNOSIS — L84 Corns and callosities: Secondary | ICD-10-CM | POA: Diagnosis not present

## 2023-07-30 MED ORDER — DICLOFENAC SODIUM 75 MG PO TBEC: 75.0000 mg | DELAYED_RELEASE_TABLET | Freq: Two times a day (BID) | ORAL | 0 refills | Status: DC

## 2023-07-30 NOTE — Progress Notes (Signed)
  Subjective:  Patient ID: SEE OMALLEY, female    DOB: 11/28/1961,   MRN: [id number]  Chief Complaint  Patient presents with   Foot Pain    Pt present today for bil pain that has been going on for a while now. Tingling sensation in both both feet and pressure makes it worse.    62 y.o. female presents for pain in both feet as above. Relates burning and tingling in her toes but also relates some pain on the balls of her feet when she has been walking for too long or on her feet for too much of the day.  Relates anti-inflammatories have been helpful. c Patient is diabetic and last A1c was  Lab Results  Component Value Date   HGBA1C 7.8 (A) 07/09/2023   .   PCP:  Agapito Games, MD   . Denies any other pedal complaints. Denies n/v/f/c.   Past Medical History:  Diagnosis Date   Ankle fracture, right 04/12/2013   COVID-19    no vaccine   Diabetes mellitus without complication (HCC)    Diverticulosis    Gastropathy    reactive   GERD (gastroesophageal reflux disease)    Helicobacter pylori gastritis    Hiatal hernia    IBS (irritable bowel syndrome)    Ileitis    Kidney stones 12/2021   PONV (postoperative nausea and vomiting)     Objective:  Physical Exam: Vascular: DP/PT pulses 2/4 bilateral. CFT <3 seconds. Absent hair growth on digits. Edema noted to bilateral lower extremities. Xerosis noted bilaterally.  Skin. No lacerations or abrasions bilateral feet. Nails 1-5 bilateral  are normal in appearance. Fat pad  atrophy noted. Mild hyperkeratotic tissue noted to plantar second and third metatarsal heads bilateral.  Musculoskeletal: MMT 5/5 bilateral lower extremities in DF, PF, Inversion and Eversion. Deceased ROM in DF of ankle joint.  Neurological: Sensation intact to light touch. Protective sensation diminished bilateral.    Assessment:   1. Diabetic peripheral neuropathy (HCC)   2. Fat pad atrophy of foot   3. Callus of foot      Plan:  Patient was  evaluated and treated and all questions answered. Discussed neuropathy and etiology as well as treatment with patient.  Radiographs reviewed and discussed with patient.  -Discussed and educated patient on diabetic foot care, especially with  regards to the vascular, neurological and musculoskeletal systems.  -Stressed the importance of good glycemic control and the detriment of not  controlling glucose levels in relation to the foot. -Discussed supportive shoes at all times and checking feet regularly.  -Follow-up with PCP for prescription management of gabapentin.  -Discussed diabetic shoes and will schedule for fitting -Refill of diclofenac provided at patient request to be taken as needed.  -Patient to return in 6 months for follow-up evaluation.    Louann Sjogren, DPM

## 2023-07-30 NOTE — Telephone Encounter (Signed)
Patient requests update/needs prior authorization for ;  Semaglutide (RYBELSUS) 3 MG TABS [[id number]]   Patient is also requesting a refill of ; Semaglutide (RYBELSUS) 3 MG TABS [[id number]]

## 2023-08-01 ENCOUNTER — Telehealth: Payer: Self-pay | Admitting: Family Medicine

## 2023-08-01 ENCOUNTER — Encounter: Payer: Self-pay | Admitting: Family Medicine

## 2023-08-01 ENCOUNTER — Telehealth: Payer: Self-pay

## 2023-08-01 NOTE — Telephone Encounter (Signed)
Can someone reach out to her and let her know that we did go ahead and submit the PA and that we are sorry for the delay.

## 2023-08-01 NOTE — Telephone Encounter (Signed)
Patient is needing a PA for Semaglutide (RYBELSUS) 3 MG TABS. States that it has been a week since she has had her medications and her sugar is in the high 300s. Please advise.

## 2023-08-01 NOTE — Telephone Encounter (Addendum)
Initiated Prior authorization VFI:EPPIRJJO 3MG  tablets Via: Covermymeds Case/Key:[id number] Status: approved  as of 08/01/23 Reason:Authorization Expiration Date: July 31, 2024. Notified Pt via: Mychart

## 2023-08-17 NOTE — Telephone Encounter (Signed)
Looks like it was approved and taken care of so we will close note.

## 2023-08-23 ENCOUNTER — Encounter: Payer: Self-pay | Admitting: Family Medicine

## 2023-08-23 ENCOUNTER — Other Ambulatory Visit: Payer: Self-pay | Admitting: Podiatry

## 2023-08-23 ENCOUNTER — Ambulatory Visit: Payer: BC Managed Care – PPO | Admitting: Family Medicine

## 2023-08-23 VITALS — Wt 198.0 lb

## 2023-08-23 DIAGNOSIS — E118 Type 2 diabetes mellitus with unspecified complications: Secondary | ICD-10-CM | POA: Diagnosis not present

## 2023-08-23 DIAGNOSIS — S80819A Abrasion, unspecified lower leg, initial encounter: Secondary | ICD-10-CM | POA: Diagnosis not present

## 2023-08-23 DIAGNOSIS — R3915 Urgency of urination: Secondary | ICD-10-CM

## 2023-08-23 DIAGNOSIS — Z7984 Long term (current) use of oral hypoglycemic drugs: Secondary | ICD-10-CM

## 2023-08-23 LAB — POCT URINALYSIS DIP (CLINITEK)
Bilirubin, UA: NEGATIVE
Blood, UA: NEGATIVE
Glucose, UA: >=1000 mg/dL — AB
Ketones, POC UA: NEGATIVE mg/dL
Leukocytes, UA: NEGATIVE
Nitrite, UA: NEGATIVE
POC PROTEIN,UA: NEGATIVE
Spec Grav, UA: >=1.03 — AB (ref 1.010–1.025)
Urobilinogen, UA: 0.2 E.U./dL
pH, UA: 5.5 (ref 5.0–8.0)

## 2023-08-23 LAB — GLUCOSE, POCT (MANUAL RESULT ENTRY): POC Glucose: 244 mg/dl — AB (ref 70–99)

## 2023-08-23 MED ORDER — NITROFURANTOIN MONOHYD MACRO 100 MG PO CAPS: 100.0000 mg | ORAL_CAPSULE | Freq: Two times a day (BID) | ORAL | 0 refills | Status: DC

## 2023-08-23 MED ORDER — RYBELSUS 7 MG PO TABS: 7.0000 mg | ORAL_TABLET | Freq: Every day | ORAL | 1 refills | Status: DC

## 2023-08-23 NOTE — Progress Notes (Signed)
Acute Office Visit  Subjective:     Patient ID: Nicole Mata, female    DOB: Aug 16, 1961, 62 y.o.   MRN: [id number]  Chief Complaint  Patient presents with   Urinary Tract Infection    Pressure,urgency x 1 day    HPI Patient is in today for urinary pressure and urgency for 1 day.no fever, chills, or hematuria. Started with pressure. Drank lots of water, took Tylenol. Night before this started woke up with intense nausea around 10:30. Felt some better the next day.   She also has a large abrasion on her right anterior shin that goes across horizontally.  The dog's leash whipped across her legs and actually caused the injury she has been putting Neosporin on it a couple times a day since Sunday so for approximately 5 days.  DM- sugar have been up. Has been back on Rybelsus for about a month. Took several weeks for insurance to approve.   ROS      Objective:    Wt 198 lb (89.8 kg)   LMP 12/04/2013   BMI 31.01 kg/m    Physical Exam Vitals and nursing note reviewed.  Constitutional:      Appearance: Normal appearance.  HENT:     Head: Normocephalic and atraumatic.  Eyes:     Conjunctiva/sclera: Conjunctivae normal.  Cardiovascular:     Rate and Rhythm: Normal rate and regular rhythm.  Pulmonary:     Effort: Pulmonary effort is normal.     Breath sounds: Normal breath sounds.  Skin:    General: Skin is warm and dry.  Neurological:     Mental Status: She is alert.  Psychiatric:        Mood and Affect: Mood normal.     Results for orders placed or performed in visit on 08/23/23  POCT URINALYSIS DIP (CLINITEK)  Result Value Ref Range   Color, UA yellow yellow   Clarity, UA clear clear   Glucose, UA >=1,000 (A) negative mg/dL   Bilirubin, UA negative negative   Ketones, POC UA negative negative mg/dL   Spec Grav, UA >=4.782 (A) 1.010 - 1.025   Blood, UA negative negative   pH, UA 5.5 5.0 - 8.0   POC PROTEIN,UA negative negative, trace   Urobilinogen, UA 0.2  0.2 or 1.0 E.U./dL   Nitrite, UA Negative Negative   Leukocytes, UA Negative Negative  POCT Glucose (CBG)  Result Value Ref Range   POC Glucose 244 (A) 70 - 99 mg/dl        Assessment & Plan:   Problem List Items Addressed This Visit       Endocrine   Controlled diabetes mellitus type 2 with complications (HCC)    Glucose > 200 today.  Glucosuria + . Will inc Rybelsus to 7mg .  Continue to work on diet and exercise.       Relevant Medications   Semaglutide (RYBELSUS) 7 MG TABS   Other Visit Diagnoses     Urgency of urination    -  Primary   Relevant Orders   POCT URINALYSIS DIP (CLINITEK) (Completed)   POCT Glucose (CBG) (Completed)   Abrasion, leg without infection           UTI - will tx with Macrobid. Sent for culture. Importance to improve glucose to allow for infection resolution.   Abrasion - stop Neosporin and switch to Vaseline.   Meds ordered this encounter  Medications   Semaglutide (RYBELSUS) 7 MG TABS    Sig:  Take 1 tablet (7 mg total) by mouth daily.    Dispense:  30 tablet    Refill:  1   nitrofurantoin, macrocrystal-monohydrate, (MACROBID) 100 MG capsule    Sig: Take 1 capsule (100 mg total) by mouth 2 (two) times daily.    Dispense:  10 capsule    Refill:  0    No follow-ups on file.  Nani Gasser, MD

## 2023-08-23 NOTE — Assessment & Plan Note (Signed)
Glucose > 200 today.  Glucosuria + . Will inc Rybelsus to 7mg .  Continue to work on diet and exercise.

## 2023-09-04 ENCOUNTER — Telehealth: Payer: Self-pay | Admitting: Internal Medicine

## 2023-09-04 NOTE — Telephone Encounter (Signed)
Pt states she has to be seen sooner, if she can't be seen sooner she will have to find another doctor. Pt scheduled to see Alcide Evener NP 101/8/24 at 1:30pm. She is aware of appt. Jan appt cancelled.

## 2023-09-04 NOTE — Telephone Encounter (Signed)
Inbound call from patient stating she has been having severe abdominal pain, diarrhea, and nausea. Patient has been scheduled for 12/11/23. Patient requesting a call to discuss what could help in the meantime. Please advise, thank you.

## 2023-09-11 ENCOUNTER — Encounter: Payer: Self-pay | Admitting: Nurse Practitioner

## 2023-09-11 ENCOUNTER — Ambulatory Visit: Payer: BC Managed Care – PPO | Admitting: Nurse Practitioner

## 2023-09-11 VITALS — BP 110/70 | HR 100 | Ht 67.0 in | Wt 195.4 lb

## 2023-09-11 DIAGNOSIS — K219 Gastro-esophageal reflux disease without esophagitis: Secondary | ICD-10-CM | POA: Diagnosis not present

## 2023-09-11 DIAGNOSIS — K429 Umbilical hernia without obstruction or gangrene: Secondary | ICD-10-CM | POA: Diagnosis not present

## 2023-09-11 DIAGNOSIS — R110 Nausea: Secondary | ICD-10-CM

## 2023-09-11 DIAGNOSIS — R1084 Generalized abdominal pain: Secondary | ICD-10-CM | POA: Diagnosis not present

## 2023-09-11 DIAGNOSIS — K5900 Constipation, unspecified: Secondary | ICD-10-CM

## 2023-09-11 MED ORDER — FAMOTIDINE 20 MG PO TABS: 20.0000 mg | ORAL_TABLET | Freq: Every day | ORAL | 1 refills | Status: DC

## 2023-09-11 MED ORDER — DICYCLOMINE HCL 10 MG PO CAPS: 10.0000 mg | ORAL_CAPSULE | Freq: Three times a day (TID) | ORAL | 1 refills | Status: AC | PRN

## 2023-09-11 NOTE — Patient Instructions (Signed)
You have been scheduled for a CT scan of the abdomen and pelvis at [facility], 1st floor Radiology. You are scheduled on 09/17/23 at 10:30 . You should arrive 2 hours prior to your appointment time for registration. Make sure that you have nothing to eat or drink 4 hours prior to your test. (Nothing after 6:30)  If you have any questions regarding your exam or if you need to reschedule, you may call [facility] at [phone] between the hours of 8:00 am and 5:00 pm, Monday-Friday.    Miralax- every night as needed  Benefiber- 1 tablespoon daily  Colonoscopy due February 2025.  Due to recent changes in healthcare laws, you may see the results of your imaging and laboratory studies on MyChart before your provider has had a chance to review them.  We understand that in some cases there may be results that are confusing or concerning to you. Not all laboratory results come back in the same time frame and the provider may be waiting for multiple results in order to interpret others.  Please give [place] 48 hours in order for your provider to thoroughly review all the results before contacting the office for clarification of your results.   Thank you for trusting me with your gastrointestinal care!   Alcide Evener, CRNP

## 2023-09-11 NOTE — Progress Notes (Unsigned)
09/11/2023 ASCENCION STEGNER [id number] Nov 16, 1961   Chief Complaint:  History of Present Illness: Nicole Mata. Nicole Mata is a 62 year old female with a past medical history of  She is known by Dr. Marina Goodell. She has generalized abdominal pain which has been persistent for the past 2 to 3 months. She describe pain as pressure on my stomach and somestimes as lower abdominal and right sided abdominal cramping.  She has fairly constant nausea. She has heartburn daily/indigestion every day which was worse last night. She takes Dexilant 60mg  daily x 3 to 4 yeas. She infrequently takes [place].  Foods such as sloppy joe or spicy foods. Sinus drainage. On Rebelsys x 4 to 5 months. She is passed a solid stool today, 11 loose and 12 pm diarrhea. Sometimes has diarrhea x 2 to 3 hours. No bloody or black stools. 2 to 3 days last week no BM. Bloat. Miralax as daily.       Latest Ref Rng & Units 11/21/2021   12:00 AM 07/25/2021    8:27 AM 02/04/2020   10:48 AM  CBC  WBC 3.8 - 10.8 Thousand/uL 7.5  7.8  8.7   Hemoglobin 11.7 - 15.5 g/dL 40.9  81.1  91.4   Hematocrit 35.0 - 45.0 % 39.3  38.9  37.2   Platelets 140 - 400 Thousand/uL 311  314  308        Latest Ref Rng & Units 07/09/2023    8:05 AM 12/12/2022    8:34 AM 06/08/2022   12:00 AM  CMP  Glucose 70 - 99 mg/dL 782  956  213   BUN 8 - 27 mg/dL 13  13  13    Creatinine 0.57 - 1.00 mg/dL 0.86  5.78  4.69   Sodium 134 - 144 mmol/L 139  141  137   Potassium 3.5 - 5.2 mmol/L 4.2  4.5  4.7   Chloride 96 - 106 mmol/L 103  103  102   CO2 20 - 29 mmol/L 24  31  28    Calcium 8.7 - 10.3 mg/dL 9.3  9.8  9.2   Total Protein 6.0 - 8.5 g/dL 7.0  7.4    Total Bilirubin 0.0 - 1.2 mg/dL 0.6  0.4    Alkaline Phos 44 - 121 IU/L 81     AST 0 - 40 IU/L 16  19    ALT 0 - 32 IU/L 10  17       EGD 01/22/2019: - The esophagus was normal with no active inflammation or stricture. No Barrett's. - The stomach was normal except for several diminutive benign fundic gland type  polyps. - The examined duodenum was normal. - The cardia and gastric fundus were normal on retroflexion.  Colonoscopy 01/22/2019: - Three 2 to 3 mm polyps in the rectum, in the transverse colon and in the cecum, removed with a cold snare. Resected and retrieved. - Diverticulosis in the left colon. - Internal hemorrhoids. - The examination was ot - 5 year recall colonoscopy   1. Surgical [P], transverse and cecum colon, polyp (2) - TUBULAR ADENOMA (X3 FRAGMENTS). - NO HIGH GRADE DYSPLASIA OR MALIGNANCY. 2. Surgical [P], rectum, polyp - HYPERPLASTIC POLYP. - NO DYSPLASIA OR MALIGNANCY.   Past Medical History:  Diagnosis Date   Ankle fracture, right 04/12/2013   COVID-19    no vaccine   Diabetes mellitus without complication (HCC)    Diverticulosis    Gastropathy    reactive   GERD (  gastroesophageal reflux disease)    Helicobacter pylori gastritis    Hiatal hernia    IBS (irritable bowel syndrome)    Ileitis    Kidney stones 12/2021   PONV (postoperative nausea and vomiting)    Past Surgical History:  Procedure Laterality Date   CHOLECYSTECTOMY  2001   LAPAROSCOPY  11/19/2012   Procedure: LAPAROSCOPY DIAGNOSTIC;  Surgeon: Clovis Pu. Cornett, MD;  Location: [facility] OR;  Service: General;  Laterality: N/A;  Diagnostic laparoscopy   SHOULDER SURGERY  2002, 2003   bilateral for bone spurs and torn RC   UMBILICAL HERNIA REPAIR  11/19/2012   Procedure: LAPAROSCOPIC UMBILICAL HERNIA;  Surgeon: Clovis Pu. Cornett, MD;  Location: [facility] OR;  Service: General;  Laterality: N/A;  ventral hernia repair with mesh       Current Medications, Allergies, Past Medical History, Past Surgical History, Family History and Social History were reviewed in [employer] record.   Review of Systems:   Constitutional: Negative for fever, sweats, chills or weight loss.  Respiratory: Negative for shortness of breath.   Cardiovascular: Negative for chest pain, palpitations and leg  swelling.  Gastrointestinal: See HPI.  Musculoskeletal: Negative for back pain or muscle aches.  Neurological: Negative for dizziness, headaches or paresthesias.    Physical Exam: LMP 12/04/2013  General: in no acute distress. Head: Normocephalic and atraumatic. Eyes: No scleral icterus. Conjunctiva pink . Ears: Normal auditory acuity. Mouth: Dentition intact. No ulcers or lesions.  Lungs: Clear throughout to auscultation. Heart: Regular rate and rhythm, no murmur. Abdomen: Soft, nontender and nondistended. No masses or hepatomegaly. Normal bowel sounds x 4 quadrants.  Rectal: *** Musculoskeletal: Symmetrical with no gross deformities. Extremities: No edema. Neurological: Alert oriented x 4. No focal deficits.  Psychological: Alert and cooperative. Normal mood and affect  Assessment and Recommendations: ***

## 2023-09-13 NOTE — Progress Notes (Signed)
Noted  

## 2023-09-17 ENCOUNTER — Ambulatory Visit (HOSPITAL_BASED_OUTPATIENT_CLINIC_OR_DEPARTMENT_OTHER)
Admission: RE | Admit: 2023-09-17 | Discharge: 2023-09-17 | Disposition: A | Discharge status: Home / Self Care | Payer: BC Managed Care – PPO | Source: Ambulatory Visit | Attending: Nurse Practitioner | Admitting: Nurse Practitioner

## 2023-09-17 DIAGNOSIS — K5730 Diverticulosis of large intestine without perforation or abscess without bleeding: Secondary | ICD-10-CM | POA: Diagnosis not present

## 2023-09-17 DIAGNOSIS — R1084 Generalized abdominal pain: Secondary | ICD-10-CM | POA: Insufficient documentation

## 2023-09-17 DIAGNOSIS — R109 Unspecified abdominal pain: Secondary | ICD-10-CM | POA: Diagnosis not present

## 2023-09-17 DIAGNOSIS — N281 Cyst of kidney, acquired: Secondary | ICD-10-CM | POA: Diagnosis not present

## 2023-09-17 DIAGNOSIS — N200 Calculus of kidney: Secondary | ICD-10-CM | POA: Diagnosis not present

## 2023-10-15 ENCOUNTER — Encounter: Payer: Self-pay | Admitting: Family Medicine

## 2023-10-15 ENCOUNTER — Ambulatory Visit: Payer: BC Managed Care – PPO | Admitting: Family Medicine

## 2023-10-15 VITALS — BP 123/69 | HR 84 | Ht 67.0 in | Wt 198.0 lb

## 2023-10-15 DIAGNOSIS — Z23 Encounter for immunization: Secondary | ICD-10-CM | POA: Diagnosis not present

## 2023-10-15 DIAGNOSIS — Z7984 Long term (current) use of oral hypoglycemic drugs: Secondary | ICD-10-CM

## 2023-10-15 DIAGNOSIS — Z6831 Body mass index (BMI) 31.0-31.9, adult: Secondary | ICD-10-CM

## 2023-10-15 DIAGNOSIS — E1142 Type 2 diabetes mellitus with diabetic polyneuropathy: Secondary | ICD-10-CM | POA: Diagnosis not present

## 2023-10-15 DIAGNOSIS — K219 Gastro-esophageal reflux disease without esophagitis: Secondary | ICD-10-CM

## 2023-10-15 DIAGNOSIS — E118 Type 2 diabetes mellitus with unspecified complications: Secondary | ICD-10-CM

## 2023-10-15 LAB — POCT GLYCOSYLATED HEMOGLOBIN (HGB A1C): Hemoglobin A1C: 8.9 % — AB (ref 4.0–5.6)

## 2023-10-15 MED ORDER — TRULICITY 0.75 MG/0.5ML ~~LOC~~ SOAJ
0.7500 mg | SUBCUTANEOUS | 0 refills | Status: DC
Start: 2023-10-15 — End: 2023-12-31

## 2023-10-15 MED ORDER — GABAPENTIN 100 MG PO CAPS
100.0000 mg | ORAL_CAPSULE | Freq: Every day | ORAL | 3 refills | Status: DC
Start: 2023-10-15 — End: 2024-10-20

## 2023-10-15 MED ORDER — TRULICITY 3 MG/0.5ML ~~LOC~~ SOAJ
3.0000 mg | SUBCUTANEOUS | 1 refills | Status: DC
Start: 2023-12-04 — End: 2023-12-31

## 2023-10-15 MED ORDER — TRULICITY 1.5 MG/0.5ML ~~LOC~~ SOAJ
1.5000 mg | SUBCUTANEOUS | 0 refills | Status: DC
Start: 2023-11-12 — End: 2023-12-31

## 2023-10-15 NOTE — Assessment & Plan Note (Signed)
He would like to go back to Trulicity which she has used in the past and tolerated much better than the [facility] or the Rybelsus.  New prescription sent to pharmacy.  Continue with metformin.  Continue to work on healthy diet and regular exercise.

## 2023-10-15 NOTE — Assessment & Plan Note (Signed)
Currently on Dexilant and famotidine.

## 2023-10-15 NOTE — Assessment & Plan Note (Signed)
Continue gabapentin.

## 2023-10-15 NOTE — Progress Notes (Signed)
Established Patient Office Visit  Subjective   Patient ID: Nicole Mata, female    DOB: 1960-12-23  Age: 62 y.o. MRN: [id number]  Chief Complaint  Patient presents with   Diabetes    HPI  Diabetes - no hypoglycemic events. No wounds or sores that are not healing well. No increased thirst or urination. Checking glucose at home.  She reports that she has gone for eye exam but we do not have the fax report will call for that today.  She ended up stopping the Rybelsus because of significant abdominal pain and nausea.  She is still taking the metformin.      ROS    Objective:     BP 123/69   Pulse 84   Ht 5\' 7"  (1.702 m)   Wt 198 lb (89.8 kg)   LMP 12/04/2013   SpO2 99%   BMI 31.01 kg/m    Physical Exam Vitals and nursing note reviewed.  Constitutional:      Appearance: Normal appearance.  HENT:     Head: Normocephalic and atraumatic.  Eyes:     Conjunctiva/sclera: Conjunctivae normal.  Cardiovascular:     Rate and Rhythm: Normal rate and regular rhythm.  Pulmonary:     Effort: Pulmonary effort is normal.     Breath sounds: Normal breath sounds.  Skin:    General: Skin is warm and dry.  Neurological:     Mental Status: She is alert.  Psychiatric:        Mood and Affect: Mood normal.      Results for orders placed or performed in visit on 10/15/23  POCT HgB A1C  Result Value Ref Range   Hemoglobin A1C 8.9 (A) 4.0 - 5.6 %   HbA1c POC (<> result, manual entry)     HbA1c, POC (prediabetic range)     HbA1c, POC (controlled diabetic range)        The 10-year ASCVD risk score (Arnett DK, et al., 2019) is: 5.5%    Assessment & Plan:   Problem List Items Addressed This Visit       Digestive   GERD    Currently on Dexilant and famotidine.        Endocrine   Diabetic peripheral neuropathy (HCC)    Continue gabapentin.        Relevant Medications   gabapentin (NEURONTIN) 100 MG capsule   Dulaglutide (TRULICITY) 0.75 MG/0.5ML SOAJ    Dulaglutide (TRULICITY) 1.5 MG/0.5ML SOAJ (Start on 11/12/2023)   Dulaglutide (TRULICITY) 3 MG/0.5ML SOAJ (Start on 12/04/2023)   Controlled diabetes mellitus type 2 with complications (HCC) - Primary    He would like to go back to Trulicity which she has used in the past and tolerated much better than the [employer] or the Rybelsus.  New prescription sent to pharmacy.  Continue with metformin.  Continue to work on healthy diet and regular exercise.      Relevant Medications   Dulaglutide (TRULICITY) 0.75 MG/0.5ML SOAJ   Dulaglutide (TRULICITY) 1.5 MG/0.5ML SOAJ (Start on 11/12/2023)   Dulaglutide (TRULICITY) 3 MG/0.5ML SOAJ (Start on 12/04/2023)   Other Relevant Orders   POCT HgB A1C (Completed)   Other Visit Diagnoses     Encounter for immunization       Relevant Orders   Flu vaccine trivalent PF, 6mos and older(Flulaval,Afluria,Fluarix,Fluzone) (Completed)   BMI 31.0-31.9,adult           Diabetic Foot Exam - Simple   Simple Foot Form Diabetic Foot exam was  performed with the following findings: Yes 10/15/2023 10:11 AM  Visual Inspection No deformities, no ulcerations, no other skin breakdown bilaterally: Yes Sensation Testing Intact to touch and monofilament testing bilaterally: Yes Pulse Check Posterior Tibialis and Dorsalis pulse intact bilaterally: Yes Comments      Return in about 3 months (around 01/15/2024) for Diabetes follow-up.    Nani Gasser, MD

## 2023-11-14 ENCOUNTER — Other Ambulatory Visit: Payer: Self-pay | Admitting: Nurse Practitioner

## 2023-11-23 ENCOUNTER — Other Ambulatory Visit: Payer: Self-pay

## 2023-11-23 DIAGNOSIS — R110 Nausea: Secondary | ICD-10-CM

## 2023-11-23 MED ORDER — ONDANSETRON 4 MG PO TBDP: 4.0000 mg | ORAL_TABLET | Freq: Three times a day (TID) | ORAL | 1 refills | Status: DC | PRN

## 2023-11-23 NOTE — Telephone Encounter (Signed)
Copied from CRM [id number]. Topic: Clinical - Medication Question >> Nov 23, 2023 10:23 AM Alvino Blood C wrote: Reason for CRM: PT is requesting a call back to have a medication refilled to help prevent nausea. PT was unsure of the medications name.

## 2023-11-23 NOTE — Telephone Encounter (Signed)
Pended Zofran. Not on current medication list.

## 2023-12-11 ENCOUNTER — Ambulatory Visit: Payer: BC Managed Care – PPO | Admitting: Physician Assistant

## 2023-12-11 ENCOUNTER — Encounter: Payer: Self-pay | Admitting: Physician Assistant

## 2023-12-11 VITALS — BP 110/72 | HR 98 | Ht 67.0 in | Wt 191.0 lb

## 2023-12-11 DIAGNOSIS — Z860100 Personal history of colon polyps, unspecified: Secondary | ICD-10-CM

## 2023-12-11 DIAGNOSIS — K219 Gastro-esophageal reflux disease without esophagitis: Secondary | ICD-10-CM

## 2023-12-11 DIAGNOSIS — Z794 Long term (current) use of insulin: Secondary | ICD-10-CM

## 2023-12-11 DIAGNOSIS — R6881 Early satiety: Secondary | ICD-10-CM

## 2023-12-11 DIAGNOSIS — K581 Irritable bowel syndrome with constipation: Secondary | ICD-10-CM | POA: Diagnosis not present

## 2023-12-11 DIAGNOSIS — R1084 Generalized abdominal pain: Secondary | ICD-10-CM

## 2023-12-11 MED ORDER — DEXILANT 60 MG PO CPDR: 60.0000 mg | DELAYED_RELEASE_CAPSULE | Freq: Every day | ORAL | 3 refills | Status: AC

## 2023-12-11 MED ORDER — FAMOTIDINE 20 MG PO TABS: 20.0000 mg | ORAL_TABLET | Freq: Every day | ORAL | 3 refills | Status: DC

## 2023-12-11 NOTE — Progress Notes (Signed)
 Subjective:    Patient ID: Nicole Mata, female    DOB: 1960-12-14, 63 y.o.   MRN: [id number]  HPI Nicole Mata is a pleasant 63 year old white female established with Dr. Abran, recently seen by Elida Nyle Sharps, NP in October 2024 with complaints of abdominal pain/pressure, nausea heartburn.  She comes in today for follow-up. She had been taking Rybelsus  at that time and it was felt that most of her symptoms may be attributed to Rybelsus .  However she did undergo repeat CT scan of the abdomen pelvis with contrast in October 2024 that showed her to be status postcholecystectomy, there was a punctate left lower pole kidney stone and sigmoid diverticulosis, otherwise unremarkable. She had Pepcid  added to her regimen 20 mg at bedtime and continued on Dexilant  60 mg p.o. every morning. She was also given a prescription for Bentyl  and says she has only been using that on a as needed basis. She has since come off of Rybelsus  and was started on Trulicity  by her PCP over the past month.  She says so far she is doing well on this and has not been bothered by many GI symptoms but is still increasing her dose.  Occasionally will have some episodes of nausea but not nearly as bad as what she had previously had she did have a bad episode of constipation around Christmas, and did not have a bowel movement for about 5 days.  This required multiple doses of MiraLAX , stool softeners prune juice etc.  She is feeling better at this point.  Her last colonoscopy had been done in February 2020, with finding of multiple diverticuli in the left colon, she had 3 small polyps removed all 2 to 3 mm in size and was noted to have internal hemorrhoids.  Biopsy showed 2 tubular adenomas and 1 hyperplastic polyp and she is indicated for follow-up at this point.  Her other medical problems include diabetes mellitus, prior history of H. pylori treated and eradicated, peripheral neuropathy and generalized anxiety disorder.   Review  of Systems Pertinent positive and negative review of systems were noted in the above HPI section.  All other review of systems was otherwise negative.   Outpatient Encounter Medications as of 12/11/2023  Medication Sig   CONTOUR TEST test strip FOR CHECKING BLOOD SUGAR ONCE DAILY   dicyclomine  (BENTYL ) 10 MG capsule Take 1 capsule (10 mg total) by mouth every 8 (eight) hours as needed.   Dulaglutide  (TRULICITY ) 0.75 MG/0.5ML SOAJ Inject 0.75 mg into the skin once a week.   Dulaglutide  (TRULICITY ) 1.5 MG/0.5ML SOAJ Inject 1.5 mg into the skin once a week.   Dulaglutide  (TRULICITY ) 3 MG/0.5ML SOAJ Inject 3 mg as directed once a week.   fluticasone  (FLONASE ) 50 MCG/ACT nasal spray Place 2 sprays into the nose daily.   gabapentin  (NEURONTIN ) 100 MG capsule Take 1 capsule (100 mg total) by mouth at bedtime.   Insulin  Pen Needle (B-D ULTRAFINE III SHORT PEN) 31G X 8 MM MISC INJECT INTO THE SKIN ONCE DAILY AS DIRECTED   ketotifen (ZADITOR) 0.025 % ophthalmic solution Place 1 drop into both eyes every morning.   Lancets (FREESTYLE) lancets For testing blood sugars up to 2 times daily. DX: E11.9. Please make sure had the dispenser/cartridge. Thank you   metFORMIN  (GLUCOPHAGE -XR) 500 MG 24 hr tablet TAKE 2 TABLETS(1000 MG) BY MOUTH DAILY WITH BREAKFAST   Misc Natural Products (APPLE CIDER VINEGAR DIET) TABS Take 1 tablet by mouth as needed. [place] ACV   ondansetron  (  ZOFRAN -ODT) 4 MG disintegrating tablet Take 1 tablet (4 mg total) by mouth every 8 (eight) hours as needed.   [DISCONTINUED] DEXILANT  60 MG capsule Take 1 capsule (60 mg total) by mouth daily before breakfast.   [DISCONTINUED] famotidine  (PEPCID ) 20 MG tablet TAKE 1 TABLET(20 MG) BY MOUTH AT BEDTIME   DEXILANT  60 MG capsule Take 1 capsule (60 mg total) by mouth daily before breakfast.   famotidine  (PEPCID ) 20 MG tablet Take 1 tablet (20 mg total) by mouth at bedtime.   No facility-administered encounter medications on file as of 12/11/2023.    Allergies  Allergen Reactions   Penicillins Anaphylaxis   Sulfa Drugs Cross Reactors Anaphylaxis   Contrast Media [Iodinated Contrast Media] Hives   Atorvastatin  Other (See Comments)    Muscle aches   Gabapentin  Other (See Comments)    GERD   Januvia  [Sitagliptin ] Other (See Comments)    Hot flashes and flushed and dizzy   Morphine And Codeine Nausea And Vomiting    Pt chooses not to take it   Prednisone     Other reaction(s): Confusion   Azithromycin Other (See Comments)    Other reaction(s): Other Makes her feel really crazy. Makes her feel really crazy.   Hydrocodone  Nausea And Vomiting   Invokana  [Canagliflozin ] Rash    Rash starting on her face   Patient Active Problem List   Diagnosis Date Noted   Kidney stones 07/01/2019   Lumbar spondylosis 06/21/2017   Diabetic peripheral neuropathy (HCC) 10/04/2016   Hyperlipidemia 06/18/2015   Chondromalacia patellae 12/15/2014   History of kidney stones 12/15/2014   Controlled diabetes mellitus type 2 with complications (HCC) 11/25/2013   Menopausal symptom 06/04/2013   Umbilical hernia 10/14/2012   Small bowel disease 10/14/2012   Disorder of intestine 10/14/2012   Cervical radiculopathy at C7 01/16/2011   Brachial neuritis 01/16/2011   Generalized anxiety disorder 04/15/2010   Stiffness of joint, not elsewhere classified, other specified site 01/25/2010   Allergic rhinitis 01/25/2010   Vitamin D deficiency 10/12/2009   Other B-complex deficiencies 10/12/2009   Pain in joints 10/07/2009   Rosacea 08/23/2009   Anxiety state 08/23/2009   Low back pain 04/22/2009   GERD 12/17/2008   Diaphragmatic hernia 12/17/2008   IRRITABLE BOWEL SYNDROME 12/17/2008   HELICOBACTER PYLORI GASTRITIS, HX OF 12/17/2008   Social History   Socioeconomic History   Marital status: Married    Spouse name: Not on file   Number of children: 0   Years of education: Not on file   Highest education level: Not on file  Occupational History    Occupation: IRA specialist  Tobacco Use   Smoking status: Never   Smokeless tobacco: Never  Vaping Use   Vaping status: Never Used  Substance and Sexual Activity   Alcohol use: Yes    Comment: very rarely   Drug use: No   Sexual activity: Yes    Partners: Male    Comment: divorced, no kids, customer service, 2 caffeine drinks daily.  Other Topics Concern   Not on file  Social History Narrative   1 can of soda a day.  No regular exercise.    Social Drivers of Corporate Investment Banker Strain: Not on file  Food Insecurity: Not on file  Transportation Needs: Not on file  Physical Activity: Not on file  Stress: Not on file  Social Connections: Unknown (04/14/2022)   Received from [facility], [facility]   Social Network    Social Network:  Not on file  Intimate Partner Violence: Not At Risk (05/28/2023)   Received from [facility], [facility]   HITS    Over the last 12 months how often did your partner physically hurt you?: Never    Over the last 12 months how often did your partner insult you or talk down to you?: Never    Over the last 12 months how often did your partner threaten you with physical harm?: Never    Over the last 12 months how often did your partner scream or curse at you?: Never    Nicole Mata's family history includes Diabetes in her brother, maternal grandmother, and maternal uncle; Diabetes type II in her mother; Heart attack (age of onset: 75) in her father; Heart disease in an other family member; Hyperlipidemia in her father and mother; Hypertension in her father and mother; Ovarian cancer in her maternal aunt and maternal aunt.      Objective:    Vitals:   12/11/23 1054  BP: 110/72  Pulse: 98    Physical Exam Well-developed well-nourished older WF  in no acute distress.  Height, Weight 191, BMI 29.9  HEENT; nontraumatic normocephalic, EOMI, PE R LA, sclera anicteric. Oropharynx;  not done Neck; supple, no JVD Cardiovascular;  regular rate and rhythm with S1-S2, no murmur rub or gallop Pulmonary; Clear bilaterally Abdomen; soft, obese, non tender, nondistended, no palpable mass or hepatosplenomegaly, bowel sounds are active Rectal;not done Skin; benign exam, no jaundice rash or appreciable lesions Extremities; no clubbing cyanosis or edema skin warm and dry Neuro/Psych; alert and oriented x4, grossly nonfocal mood and affect appropriate        Assessment & Plan:   #46 63 year old white female with history of chronic GERD, currently stable on Dexilant  60 mg p.o. every morning and famotidine  20 mg p.o. nightly. When she had been seen previously in October 2024 she had a lot of complaints of abdominal pain pressure nausea increased heartburn and reflux all of which were likely secondary to Rybelsus  which she has since stopped with significant improvement in symptoms.  Now on Trulicity  over the past 1 month and tolerating well thus far.  #2 IBS-constipation predominant, had an episode of significant constipation about 2 weeks ago improved now using Benefiber daily and as needed MiraLAX   #3 history of adenomatous colon polyps-last colonoscopy 2020 and due for 5-year interval follow-up  #4 status post cholecystectomy #5.  Diverticulosis #6.  History of kidney stones #7.  Adult onset diabetes mellitus #8.  Anxiety disorder  Plan; continue Dexilant  60 mg p.o. every morning, refill sent Continue famotidine  20 mg p.o. nightly, refill sent Can continue Bentyl  10 mg daily to 3 times daily as needed Continue fiber supplement daily and as needed MiraLAX  Patient needs to be scheduled for follow-up Colonoscopy, however she would like to wait till after Tax season.  I have asked her to call back in early February to get scheduled for Colonoscopy with Dr. Abran in the [facility] in May or June.  She will need to hold Trulicity  for 1 week prior to the procedure. Follow-up in the office as needed.  Plan   Nicole Mata Corti  PA-C 12/11/2023   Cc: Alvan Dorothyann BIRCH, *

## 2023-12-11 NOTE — Patient Instructions (Signed)
 We have sent the following medications to your pharmacy for you to pick up at your convenience:  Dexilant , Pepcid   Use Miralax  1-2 doses per day as needed.  Call back in February to schedule a colonoscopy.  You will hold Trulicity  for one week.  _______________________________________________________  If your blood pressure at your visit was 140/90 or greater, please contact your primary care physician to follow up on this.  _______________________________________________________  If you are age 29 or older, your body mass index should be between 23-30. Your Body mass index is 29.91 kg/m. If this is out of the aforementioned range listed, please consider follow up with your Primary Care Provider.  If you are age 85 or younger, your body mass index should be between 19-25. Your Body mass index is 29.91 kg/m. If this is out of the aformentioned range listed, please consider follow up with your Primary Care Provider.   ________________________________________________________  The [facility] providers would like to encourage you to use MYCHART to communicate with providers for non-urgent requests or questions.  Due to long hold times on the telephone, sending your provider a message by [facility] may be a faster and more efficient way to get a response.  Please allow 48 business hours for a response.  Please remember that this is for non-urgent requests.  _______________________________________________________

## 2023-12-11 NOTE — Progress Notes (Signed)
 Noted.

## 2023-12-31 ENCOUNTER — Ambulatory Visit: Payer: BC Managed Care – PPO | Admitting: Physician Assistant

## 2023-12-31 ENCOUNTER — Encounter: Payer: Self-pay | Admitting: Physician Assistant

## 2023-12-31 VITALS — BP 135/74 | HR 92 | Temp 98.7°F | Ht 67.0 in | Wt 188.8 lb

## 2023-12-31 DIAGNOSIS — R112 Nausea with vomiting, unspecified: Secondary | ICD-10-CM | POA: Insufficient documentation

## 2023-12-31 DIAGNOSIS — K219 Gastro-esophageal reflux disease without esophagitis: Secondary | ICD-10-CM | POA: Diagnosis not present

## 2023-12-31 DIAGNOSIS — M549 Dorsalgia, unspecified: Secondary | ICD-10-CM | POA: Insufficient documentation

## 2023-12-31 DIAGNOSIS — T887XXA Unspecified adverse effect of drug or medicament, initial encounter: Secondary | ICD-10-CM

## 2023-12-31 DIAGNOSIS — R1013 Epigastric pain: Secondary | ICD-10-CM | POA: Insufficient documentation

## 2023-12-31 DIAGNOSIS — R300 Dysuria: Secondary | ICD-10-CM | POA: Diagnosis not present

## 2023-12-31 LAB — POCT URINALYSIS DIP (CLINITEK)
Bilirubin, UA: NEGATIVE
Blood, UA: NEGATIVE
Glucose, UA: NEGATIVE mg/dL
Leukocytes, UA: NEGATIVE
Nitrite, UA: NEGATIVE
POC PROTEIN,UA: 30 — AB
Spec Grav, UA: 1.02 (ref 1.010–1.025)
Urobilinogen, UA: 0.2 U/dL
pH, UA: 5.5 (ref 5.0–8.0)

## 2023-12-31 MED ORDER — PROMETHAZINE HCL 12.5 MG PO TABS: ORAL_TABLET | ORAL | 0 refills | Status: DC

## 2023-12-31 MED ORDER — ALUM & MAG HYDROXIDE-SIMETH 200-200-20 MG/5ML PO SUSP
30.0000 mL | Freq: Once | ORAL | Status: AC
  Administered 2023-12-31: 30 mL via ORAL

## 2023-12-31 MED ORDER — HYOSCYAMINE SULFATE 0.125 MG PO TBDP
0.1250 mg | ORAL_TABLET | Freq: Once | ORAL | Status: AC
  Administered 2023-12-31: 0.125 mg via SUBLINGUAL

## 2023-12-31 MED ORDER — LIDOCAINE VISCOUS HCL 2 % MT SOLN
15.0000 mL | Freq: Once | OROMUCOSAL | Status: AC
  Administered 2023-12-31: 15 mL via OROMUCOSAL

## 2023-12-31 MED ORDER — ONDANSETRON 8 MG PO TBDP: 8.0000 mg | ORAL_TABLET | Freq: Three times a day (TID) | ORAL | 0 refills | Status: DC | PRN

## 2023-12-31 NOTE — Patient Instructions (Signed)
Phenergan and zofran as needed for nausea Get labs today Hold on next trulicity dose  Gastritis, Adult Gastritis is inflammation of the stomach. There are two kinds of gastritis: Acute gastritis. This kind develops suddenly. Chronic gastritis. This kind is much more common. It develops slowly and lasts for a long time. Gastritis happens when the lining of the stomach becomes weak or gets damaged. Without treatment, gastritis can lead to stomach bleeding and ulcers. What are the causes? This condition may be caused by: An infection. Drinking too much alcohol. Certain medicines. These include steroids, antibiotics, and some over-the-counter medicines, such as aspirin or ibuprofen. Having too much acid in the stomach. Having a disease of the stomach. Other causes may include: An allergic reaction. Some cancer treatments (radiation). Smoking cigarettes or the use of products that contain nicotine or tobacco. In some cases, the cause of this condition is not known. What increases the risk? Having a disease of the intestines. Having a disease in which the body's immune system attacks the body (autoimmune disease), such as Crohn's disease. Using aspirin or ibuprofen and other NSAIDs to treat other conditions, such as heart disease or chronic pain. Stress. What are the signs or symptoms? Symptoms of this condition include: Pain or a burning sensation in the upper abdomen. Nausea. Vomiting. An uncomfortable feeling of fullness after eating. Weight loss. Bad breath. Blood in your vomit or stool (feces). In some cases, there are no symptoms. How is this diagnosed? This condition may be diagnosed based on your medical history, a physical exam, and tests. Tests may include: Your medical history and a description of your symptoms. A physical exam. Tests. These can include: Blood tests. Stool tests. A test in which a thin, flexible instrument with a light and a camera is passed down the  esophagus and into the stomach (upper endoscopy). A test in which a tissue sample is removed to look at it under a microscope (biopsy). How is this treated? This condition may be treated with medicines. The medicines that are used vary depending on the cause of the gastritis. If the condition is caused by a bacterial infection, you may be given antibiotic medicines. If the condition is caused by too much acid in the stomach, you may be given medicines called H2 blockers, proton pump inhibitors, or antacids. Treatment may also involve stopping the use of certain medicines such as aspirin or ibuprofen and other NSAIDs. Follow these instructions at home: Medicines Take over-the-counter and prescription medicines only as told by your health care provider. If you were prescribed an antibiotic medicine, take it as told by your health care provider. Do not stop taking the antibiotic even if you start to feel better. Alcohol use Do not drink alcohol if: Your health care provider tells you not to drink. You are pregnant, may be pregnant, or are planning to become pregnant. If you drink alcohol: Limit your use to: 0-1 drink a day for women. 0-2 drinks a day for men. Know how much alcohol is in your drink. In the [place] one drink equals one 12 oz bottle of beer (355 mL), one 5 oz glass of wine (148 mL), or one 1 oz glass of hard liquor (44 mL). General instructions  Eat small, frequent meals instead of large meals. Avoid foods and drinks that make your symptoms worse. Talk with your health care provider about ways to manage stress, such as getting regular exercise or practicing deep breathing, meditation, or yoga. Do not use any products that  contain nicotine or tobacco. These products include cigarettes, chewing tobacco, and vaping devices, such as e-cigarettes. If you need help quitting, ask your health care provider. Drink enough fluid to keep your urine pale yellow. Keep all follow-up visits.  This is important. Contact a health care provider if: Your symptoms get worse. Your abdominal pain gets worse. Your symptoms return after treatment. You have a fever. Get help right away if: You vomit blood or a substance that looks like coffee grounds. You have black or dark red stools. You are unable to keep fluids down. These symptoms may represent a serious problem that is an emergency. Do not wait to see if the symptoms will go away. Get medical help right away. Call your local emergency services (911 in the [place] Do not drive yourself to the hospital. Summary Gastritis is inflammation of the lining of the stomach that can occur suddenly (acute) or develop slowly over time (chronic). This condition is diagnosed with a medical history, a physical exam, or tests. This condition may be treated with medicines to treat infection or medicines to reduce the amount of acid in your stomach. Follow your health care provider's instructions about taking medicines, making changes to your diet, and knowing when to call for help. This information is not intended to replace advice given to you by your health care provider. Make sure you discuss any questions you have with your health care provider. Document Revised: 03/26/2021 Document Reviewed: 03/26/2021 Elsevier Patient Education  2024 [employer].

## 2023-12-31 NOTE — Progress Notes (Signed)
Acute Office Visit  Subjective:     Patient ID: Nicole Mata, female    DOB: 1961-03-19, 63 y.o.   MRN: [id number]  Chief Complaint  Patient presents with   Dysuria    Burning with urination , and left sided low back pain, abdominal pain  x  Friday 12/28/23. States has history of kidney stones.    Emesis    Two episodes of vomiting, elevated heartrate, sligth diarrhea x yesterday     Dysuria  Associated symptoms include nausea and vomiting. Pertinent negatives include no frequency, hematuria or urgency.  Emesis  Associated symptoms include abdominal pain and diarrhea.   Patient is in today for dysuria, emesis, and diarrhea. Pt states that she has been experiencing dysuria since Friday. She states that it has only been burning with urination and she denies any urinary frequency, hesitancy or urgency. She also denies hematuria. She states that she has also been experiencing left-sided low back pain that does not radiate. She states that it is not similar to the pain that she has experienced with past kidney stones. She has been sleeping on the couch the last 1-2 nights d/t her episodes of nausea/vomiting and believes it might be related to that.  Pt is also complaining of nausea and two episodes of vomiting in the last two days. She is currently on 3 mg of Trulicity and states she increased her dose from 1.5 mg to 3 mg about 3 weeks ago and has had increasing nausea since then. Her last dose was this last Friday. She states that she began to feel increased nausea yesterday at work and she had two episodes of vomiting throughout the night. She took Zofran and had some ginger chews but these were not helpful. She has also been experiencing increased GERD in which she has taken Pepcid for, with minimal relief.   Last night she also experienced an episode of abdominal cramping and diarrhea. She says she typically is constipated d/t the Trulicity, so the diarrhea was unusual. She has not had  another episode since.   Review of Systems  Gastrointestinal:  Positive for abdominal pain, diarrhea, nausea and vomiting.  Genitourinary:  Positive for dysuria. Negative for frequency, hematuria and urgency.  Musculoskeletal:  Positive for back pain (left-sided, lower back).  All other systems reviewed and are negative.   Active Ambulatory Problems    Diagnosis Date Noted   GERD 12/17/2008   Diaphragmatic hernia 12/17/2008   IRRITABLE BOWEL SYNDROME 12/17/2008   HELICOBACTER PYLORI GASTRITIS, HX OF 12/17/2008   Cervical radiculopathy at C7 01/16/2011   Umbilical hernia 10/14/2012   Small bowel disease 10/14/2012   Menopausal symptom 06/04/2013   Controlled diabetes mellitus type 2 with complications (HCC) 11/25/2013   Chondromalacia patellae 12/15/2014   History of kidney stones 12/15/2014   Hyperlipidemia 06/18/2015   Diabetic peripheral neuropathy (HCC) 10/04/2016   Lumbar spondylosis 06/21/2017   Kidney stones 07/01/2019   Vitamin D deficiency 10/12/2009   Stiffness of joint, not elsewhere classified, other specified site 01/25/2010   Rosacea 08/23/2009   Pain in joints 10/07/2009   Other B-complex deficiencies 10/12/2009   Low back pain 04/22/2009   Generalized anxiety disorder 04/15/2010   Disorder of intestine 10/14/2012   Brachial neuritis 01/16/2011   Anxiety state 08/23/2009   Allergic rhinitis 01/25/2010   Nausea and vomiting 12/31/2023   Mid back pain on left side 12/31/2023   Epigastric pain 12/31/2023   Resolved Ambulatory Problems    Diagnosis Date Noted  CHEST PAIN 05/24/2009   RUQ PAIN 12/21/2008   Acute maxillary sinusitis 09/14/2011   Acute bronchitis 09/14/2011   Obese 02/06/2012   Insulin resistance 02/07/2012   Ileitis 06/07/2012   Diarrhea 06/07/2012   Ankle fracture, right 09/10/2013   Lateral epicondylitis of right elbow 12/15/2014   Urinary urgency 10/04/2016   Nausea 12/14/2017   Eustachian tube dysfunction, bilateral 02/01/2018    Flushing 04/15/2010   COVID-19 vaccination declined 11/22/2020   Fall from slipping on ice 12/24/2020   Past Medical History:  Diagnosis Date   COVID-19    Diabetes mellitus without complication (HCC)    Diverticulosis    Gastropathy    GERD (gastroesophageal reflux disease)    Helicobacter pylori gastritis    Hiatal hernia    IBS (irritable bowel syndrome)    PONV (postoperative nausea and vomiting)        Objective:    BP 135/74   Pulse 92   Temp 98.7 F (37.1 C) (Oral)   Ht 5\' 7"  (1.702 m)   Wt 188 lb 12 oz (85.6 kg)   LMP 12/04/2013   SpO2 97%   BMI 29.56 kg/m   BP Readings from Last 3 Encounters:  12/31/23 135/74  12/11/23 110/72  10/15/23 123/69   Wt Readings from Last 3 Encounters:  12/31/23 188 lb 12 oz (85.6 kg)  12/11/23 191 lb (86.6 kg)  10/15/23 198 lb (89.8 kg)     Physical Exam Constitutional:      Appearance: Normal appearance.  HENT:     Head: Normocephalic.  Cardiovascular:     Rate and Rhythm: Normal rate and regular rhythm.     Heart sounds: Normal heart sounds.  Pulmonary:     Effort: Pulmonary effort is normal.     Breath sounds: Normal breath sounds.  Abdominal:     General: Bowel sounds are normal. There is no distension.     Palpations: Abdomen is soft.     Tenderness: There is no abdominal tenderness. There is no right CVA tenderness, left CVA tenderness or guarding.  Musculoskeletal:        General: Tenderness (mild point tenderness of the low back (left side)) present.     Right lower leg: No edema.     Left lower leg: No edema.  Skin:    General: Skin is warm and dry.  Neurological:     General: No focal deficit present.     Mental Status: She is alert and oriented to person, place, and time.  Psychiatric:        Mood and Affect: Mood normal.        Behavior: Behavior normal.     Results for orders placed or performed in visit on 12/31/23  POCT URINALYSIS DIP (CLINITEK)  Result Value Ref Range   Color, UA yellow  yellow   Clarity, UA clear clear   Glucose, UA negative negative mg/dL   Bilirubin, UA negative negative   Ketones, POC UA trace (5) (A) negative mg/dL   Spec Grav, UA 9.147 8.295 - 1.025   Blood, UA negative negative   pH, UA 5.5 5.0 - 8.0   POC PROTEIN,UA =30 (A) negative, trace   Urobilinogen, UA 0.2 0.2 or 1.0 E.U./dL   Nitrite, UA Negative Negative   Leukocytes, UA Negative Negative       Assessment & Plan:   [place] was seen today for dysuria and emesis.  Diagnoses and all orders for this visit:  Nausea and vomiting, unspecified vomiting  type -     Lipase -     CMP14+EGFR -     CBC w/Diff/Platelet -     ondansetron (ZOFRAN-ODT) 8 MG disintegrating tablet; Take 1 tablet (8 mg total) by mouth every 8 (eight) hours as needed. -     promethazine (PHENERGAN) 12.5 MG tablet; Take one to two tablets as needed for severe nausea -     alum & mag hydroxide-simeth (MAALOX/MYLANTA) 200-200-20 MG/5ML suspension 30 mL -     lidocaine (XYLOCAINE) 2 % viscous mouth solution 15 mL -     hyoscyamine (ANASPAZ) disintergrating tablet 0.125 mg  Dysuria -     POCT URINALYSIS DIP (CLINITEK) -     Cancel: Urine Culture -     Urine Culture  Medication side effect -     Lipase -     CMP14+EGFR -     CBC w/Diff/Platelet  Mid back pain on left side -     Lipase -     CMP14+EGFR -     CBC w/Diff/Platelet -     alum & mag hydroxide-simeth (MAALOX/MYLANTA) 200-200-20 MG/5ML suspension 30 mL -     lidocaine (XYLOCAINE) 2 % viscous mouth solution 15 mL -     hyoscyamine (ANASPAZ) disintergrating tablet 0.125 mg  Epigastric pain -     Lipase -     CMP14+EGFR -     CBC w/Diff/Platelet -     ondansetron (ZOFRAN-ODT) 8 MG disintegrating tablet; Take 1 tablet (8 mg total) by mouth every 8 (eight) hours as needed. -     promethazine (PHENERGAN) 12.5 MG tablet; Take one to two tablets as needed for severe nausea -     alum & mag hydroxide-simeth (MAALOX/MYLANTA) 200-200-20 MG/5ML suspension 30  mL -     lidocaine (XYLOCAINE) 2 % viscous mouth solution 15 mL -     hyoscyamine (ANASPAZ) disintergrating tablet 0.125 mg  Gastroesophageal reflux disease without esophagitis    POCT UA shows protein and ketones; will culture Less likely kidney stones because no blood in urine Less likely infection due to no blood, leukocytes, or nitrites Lipase/CBC/CMP labs ordered concerned about pancreatitis due to trulicity Gave GI cocktail in office Pt will continue dexilant and pepcid for GERD symptoms  Decrease Trulicity to 1.5 mg/wk d/t N/V and abd pain  Take Zofran 8 mg as needed for nausea during the day; use Phenergine 12.5 mg for severe nausea at night Eat small, bland meals  Follow up if symptoms worsen or do not improve  Tandy Gaw, PA-C

## 2024-01-01 ENCOUNTER — Other Ambulatory Visit: Payer: Self-pay | Admitting: Physician Assistant

## 2024-01-01 ENCOUNTER — Encounter: Payer: Self-pay | Admitting: Physician Assistant

## 2024-01-01 ENCOUNTER — Telehealth: Payer: Self-pay

## 2024-01-01 LAB — CMP14+EGFR
ALT: 12 [IU]/L (ref 0–32)
AST: 16 [IU]/L (ref 0–40)
Albumin: 4 g/dL (ref 3.8–4.9)
Alkaline Phosphatase: 81 [IU]/L (ref 44–121)
BUN/Creatinine Ratio: 12 (ref 12–28)
BUN: 10 mg/dL (ref 8–27)
Bilirubin Total: 0.8 mg/dL (ref 0.0–1.2)
CO2: 25 mmol/L (ref 20–29)
Calcium: 8.9 mg/dL (ref 8.7–10.3)
Chloride: 99 mmol/L (ref 96–106)
Creatinine, Ser: 0.81 mg/dL (ref 0.57–1.00)
Globulin, Total: 2.8 g/dL (ref 1.5–4.5)
Glucose: 147 mg/dL — ABNORMAL HIGH (ref 70–99)
Potassium: 4 mmol/L (ref 3.5–5.2)
Sodium: 137 mmol/L (ref 134–144)
Total Protein: 6.8 g/dL (ref 6.0–8.5)
eGFR: 83 mL/min/{1.73_m2} (ref >59)

## 2024-01-01 LAB — CBC WITH DIFFERENTIAL/PLATELET
Basophils Absolute: 0 10*3/uL (ref 0.0–0.2)
Basos: 0 %
EOS (ABSOLUTE): 0.2 10*3/uL (ref 0.0–0.4)
Eos: 3 %
Hematocrit: 39.5 % (ref 34.0–46.6)
Hemoglobin: 13.2 g/dL (ref 11.1–15.9)
Immature Grans (Abs): 0 10*3/uL (ref 0.0–0.1)
Immature Granulocytes: 0 %
Lymphocytes Absolute: 1.6 10*3/uL (ref 0.7–3.1)
Lymphs: 19 %
MCH: 29.3 pg (ref 26.6–33.0)
MCHC: 33.4 g/dL (ref 31.5–35.7)
MCV: 88 fL (ref 79–97)
Monocytes Absolute: 0.6 10*3/uL (ref 0.1–0.9)
Monocytes: 7 %
Neutrophils Absolute: 5.8 10*3/uL (ref 1.4–7.0)
Neutrophils: 71 %
Platelets: 364 10*3/uL (ref 150–450)
RBC: 4.51 x10E6/uL (ref 3.77–5.28)
RDW: 12.2 % (ref 11.7–15.4)
WBC: 8.1 10*3/uL (ref 3.4–10.8)

## 2024-01-01 LAB — LIPASE: Lipase: 24 U/L (ref 14–72)

## 2024-01-01 MED ORDER — TRULICITY 1.5 MG/0.5ML ~~LOC~~ SOAJ
1.5000 mg | SUBCUTANEOUS | 2 refills | Status: DC
Start: 2024-01-01 — End: 2024-05-26

## 2024-01-01 NOTE — Progress Notes (Signed)
Nicole Mata,   Kidney and liver look good.  WBC normal Hemoglobin good.  Lipase normal-not pancreatitis  I really think this is just a medication side effect. Hold next dose until GI symptoms resolved and then start back at 1.5mg  dose weekly.

## 2024-01-01 NOTE — Telephone Encounter (Signed)
Patient has been informed of results earlier today.

## 2024-01-01 NOTE — Telephone Encounter (Signed)
Copied from CRM [id number]. Topic: Clinical - Lab/Test Results >> Jan 01, 2024  9:21 AM Adelina Mings wrote: Reason for CRM: patient requesting results

## 2024-01-02 LAB — URINE CULTURE: Organism ID, Bacteria: NO GROWTH

## 2024-01-02 NOTE — Progress Notes (Signed)
No bacteria growth on urine culture.

## 2024-01-21 ENCOUNTER — Encounter: Payer: Self-pay | Admitting: Family Medicine

## 2024-01-21 ENCOUNTER — Ambulatory Visit: Payer: BC Managed Care – PPO | Admitting: Family Medicine

## 2024-01-21 VITALS — BP 133/72 | HR 93 | Ht 67.0 in | Wt 189.0 lb

## 2024-01-21 DIAGNOSIS — Z7984 Long term (current) use of oral hypoglycemic drugs: Secondary | ICD-10-CM

## 2024-01-21 DIAGNOSIS — E118 Type 2 diabetes mellitus with unspecified complications: Secondary | ICD-10-CM | POA: Diagnosis not present

## 2024-01-21 DIAGNOSIS — E7800 Pure hypercholesterolemia, unspecified: Secondary | ICD-10-CM | POA: Diagnosis not present

## 2024-01-21 LAB — POCT GLYCOSYLATED HEMOGLOBIN (HGB A1C): Hemoglobin A1C: 7.5 % — AB (ref 4.0–5.6)

## 2024-01-21 NOTE — Assessment & Plan Note (Signed)
To recheck lipids.

## 2024-01-21 NOTE — Assessment & Plan Note (Signed)
 A1c looks much better compared to previous so removing in the right direction but still need to get the A1c under 7.  Unfortunately she did have some significant nausea and constipation with the increased dose of Trulicity.  We discussed continuing to work on healthy diet and regular exercise and then following up in 3 months.  If at that point were still not quite under 7 then we may retry the 3 mg Trulicity.  Continue to take stool softeners and laxatives as needed for constipation secondary to the GLP-1 if needed.  Sinew metformin 1000 mg daily.

## 2024-01-21 NOTE — Progress Notes (Signed)
   Established Patient Office Visit  Subjective  Patient ID: Nicole Mata, female    DOB: 1961-06-06  Age: 63 y.o. MRN: [id number]  No chief complaint on file.   HPI  Diabetes - no hypoglycemic events. No wounds or sores that are not healing well. No increased thirst or urination. Checking glucose at home. Taking medications as prescribed without any side effects. The 3.0mg  Trulicity was too much for her nausea.  Had her eye exam done this last summer, and is up to date.   Had labs in Jan when came in for nausea and vomiting.   Annette Stable is cancer free!!!!  Result came back after surgery from breast cancer.      ROS    Objective:     BP 133/72   Pulse 93   Ht 5\' 7"  (1.702 m)   Wt 189 lb (85.7 kg)   LMP 12/04/2013   SpO2 98%   BMI 29.60 kg/m    Physical Exam Vitals and nursing note reviewed.  Constitutional:      Appearance: Normal appearance.  HENT:     Head: Normocephalic and atraumatic.  Eyes:     Conjunctiva/sclera: Conjunctivae normal.  Cardiovascular:     Rate and Rhythm: Normal rate and regular rhythm.  Pulmonary:     Effort: Pulmonary effort is normal.     Breath sounds: Normal breath sounds.  Skin:    General: Skin is warm and dry.  Neurological:     Mental Status: She is alert.  Psychiatric:        Mood and Affect: Mood normal.      Results for orders placed or performed in visit on 01/21/24  POCT HgB A1C  Result Value Ref Range   Hemoglobin A1C 7.5 (A) 4.0 - 5.6 %   HbA1c POC (<> result, manual entry)     HbA1c, POC (prediabetic range)     HbA1c, POC (controlled diabetic range)        The 10-year ASCVD risk score (Arnett DK, et al., 2019) is: 6.4%    Assessment & Plan:   Problem List Items Addressed This Visit       Endocrine   Controlled diabetes mellitus type 2 with complications (HCC) - Primary   A1c looks much better compared to previous so removing in the right direction but still need to get the A1c under 7.  Unfortunately she  did have some significant nausea and constipation with the increased dose of Trulicity.  We discussed continuing to work on healthy diet and regular exercise and then following up in 3 months.  If at that point were still not quite under 7 then we may retry the 3 mg Trulicity.  Continue to take stool softeners and laxatives as needed for constipation secondary to the GLP-1 if needed.  Sinew metformin 1000 mg daily.      Relevant Orders   POCT HgB A1C (Completed)     Other   Hyperlipidemia   To recheck lipids.      Relevant Orders   Lipid panel    Scheduling colonscopy this Spring We will call for last eye exam.  Encouraged her to consider getting updated pneumonia vaccine.  Return in about 14 weeks (around 04/28/2024) for Diabetes follow-up.    Nani Gasser, MD

## 2024-01-30 ENCOUNTER — Ambulatory Visit (INDEPENDENT_AMBULATORY_CARE_PROVIDER_SITE_OTHER): Payer: BC Managed Care – PPO | Admitting: Podiatry

## 2024-01-30 ENCOUNTER — Encounter: Payer: Self-pay | Admitting: Podiatry

## 2024-01-30 ENCOUNTER — Encounter: Payer: Self-pay | Admitting: Internal Medicine

## 2024-01-30 DIAGNOSIS — L909 Atrophic disorder of skin, unspecified: Secondary | ICD-10-CM

## 2024-01-30 DIAGNOSIS — L84 Corns and callosities: Secondary | ICD-10-CM

## 2024-01-30 DIAGNOSIS — E1142 Type 2 diabetes mellitus with diabetic polyneuropathy: Secondary | ICD-10-CM

## 2024-01-30 NOTE — Progress Notes (Signed)
  Subjective:  Patient ID: Nicole Mata, female    DOB: 26-Feb-1961,   MRN: [id number]  Chief Complaint  Patient presents with   [facility]    RM#[id number]    63 y.o. female presents for pain in both feet as above. Relates burning and tingling in her toes relates the pain in the ball of her foot is still present. Never did get diabetic shoes after last visit.   Relates anti-inflammatories have been helpful.  Patient is diabetic and last A1c was  Lab Results  Component Value Date   HGBA1C 7.5 (A) 01/21/2024   .   PCP:  Agapito Games, MD   . Denies any other pedal complaints. Denies n/v/f/c.   Past Medical History:  Diagnosis Date   Ankle fracture, right 04/12/2013   COVID-19    no vaccine   Diabetes mellitus without complication (HCC)    Diverticulosis    Gastropathy    reactive   GERD (gastroesophageal reflux disease)    Helicobacter pylori gastritis    Hiatal hernia    IBS (irritable bowel syndrome)    Ileitis    Kidney stones 12/2021   PONV (postoperative nausea and vomiting)     Objective:  Physical Exam: Vascular: DP/PT pulses 2/4 bilateral. CFT <3 seconds. Absent hair growth on digits. Edema noted to bilateral lower extremities. Xerosis noted bilaterally.  Skin. No lacerations or abrasions bilateral feet. Nails 1-5 bilateral  are normal in appearance. Fat pad  atrophy noted. Mild hyperkeratotic tissue noted to plantar second and third metatarsal heads bilateral.  Musculoskeletal: MMT 5/5 bilateral lower extremities in DF, PF, Inversion and Eversion. Deceased ROM in DF of ankle joint. Fat pad atrophy noted.  Neurological: Sensation intact to light touch. Protective sensation diminished bilateral.    Assessment:   1. Diabetic peripheral neuropathy (HCC)      Plan:  Patient was evaluated and treated and all questions answered. Discussed neuropathy and etiology as well as treatment with patient.  Radiographs reviewed and discussed with patient.  -Discussed and  educated patient on diabetic foot care, especially with  regards to the vascular, neurological and musculoskeletal systems.  -Stressed the importance of good glycemic control and the detriment of not  controlling glucose levels in relation to the foot. -Discussed supportive shoes at all times and checking feet regularly.  -Follow-up with PCP for prescription management of gabapentin.  -Discussed diabetic shoes and will schedule for fitting -Metatarsal padding provided to help with fat pad atrophy -Patient to return in 6 months for follow-up evaluation.    Louann Sjogren, DPM

## 2024-02-21 ENCOUNTER — Telehealth: Payer: Self-pay

## 2024-02-21 NOTE — Telephone Encounter (Signed)
 Left VM to pt to reschedule appt, Nicki Guadalajara will be out of office on 4/3

## 2024-02-26 ENCOUNTER — Telehealth: Payer: Self-pay

## 2024-02-26 NOTE — Telephone Encounter (Signed)
 Copied from CRM [id number]. Topic: Clinical - Medication Question >> Feb 26, 2024  1:22 PM Turkey B wrote: Reason for CRM: pt called in for appt for sinus issues,mild  headache and pressure, no appt until 03/31

## 2024-02-26 NOTE — Telephone Encounter (Signed)
 Task completed. Patient has a virtual appointment scheduled for 02/28/24 with provider.

## 2024-02-28 ENCOUNTER — Telehealth (INDEPENDENT_AMBULATORY_CARE_PROVIDER_SITE_OTHER): Admitting: Family Medicine

## 2024-02-28 ENCOUNTER — Encounter: Payer: Self-pay | Admitting: Family Medicine

## 2024-02-28 DIAGNOSIS — J0100 Acute maxillary sinusitis, unspecified: Secondary | ICD-10-CM | POA: Diagnosis not present

## 2024-02-28 MED ORDER — DOXYCYCLINE HYCLATE 100 MG PO TABS
100.0000 mg | ORAL_TABLET | Freq: Two times a day (BID) | ORAL | 0 refills | Status: DC
Start: 2024-02-28 — End: 2024-03-07

## 2024-02-28 NOTE — Progress Notes (Signed)
    Virtual Visit via Video Note  I connected with Nicole Mata on 02/28/24 at  1:00 PM EDT by a video enabled telemedicine application and verified that I am speaking with the correct person using two identifiers.   I discussed the limitations of evaluation and management by telemedicine and the availability of in person appointments. The patient expressed understanding and agreed to proceed.  Patient location: at home Provider location: in office  Subjective:    CC:   Chief Complaint  Patient presents with   Sinusitis    X 2 weeks tylenol sinus worse at night bilateral ear pain and pressure. No f/s/c She did not take a flu or covid test    HPI: Reports sinus congestion for a little over a week.  She is now getting a lot of pressure in her bilateral maxillary sinuses and now her ears are starting to hurt.  She has had a lot of postnasal drip that seems to be worse at night.  No fevers or chills.  There have been several people at work that have had head colds.  She denies any chest symptoms such as shortness of breath or cough.  She has had a little bit of eye irritation but no significant allergy symptoms   Past medical history, Surgical history, Family history not pertinant except as noted below, Social history, Allergies, and medications have been entered into the medical record, reviewed, and corrections made.    Objective:    General: Speaking clearly in complete sentences without any shortness of breath.  Alert and oriented x3.  Normal judgment. No apparent acute distress.    Impression and Recommendations:    Problem List Items Addressed This Visit   None Visit Diagnoses       Acute non-recurrent maxillary sinusitis    -  Primary   Relevant Medications   doxycycline (VIBRA-TABS) 100 MG tablet       Treat with Doxy, okay to continue with symptomatic care.  Call if not better in 1 week.  Recommend continue with nasal saline rinse and running a humidifier.  No  orders of the defined types were placed in this encounter.   Meds ordered this encounter  Medications   doxycycline (VIBRA-TABS) 100 MG tablet    Sig: Take 1 tablet (100 mg total) by mouth 2 (two) times daily.    Dispense:  14 tablet    Refill:  0    I discussed the assessment and treatment plan with the patient. The patient was provided an opportunity to ask questions and all were answered. The patient agreed with the plan and demonstrated an understanding of the instructions.   The patient was advised to call back or seek an in-person evaluation if the symptoms worsen or if the condition fails to improve as anticipated.   Nani Gasser, MD

## 2024-03-06 ENCOUNTER — Other Ambulatory Visit: Payer: BC Managed Care – PPO

## 2024-03-07 ENCOUNTER — Ambulatory Visit (AMBULATORY_SURGERY_CENTER): Payer: BC Managed Care – PPO

## 2024-03-07 VITALS — Ht 67.0 in | Wt 185.0 lb

## 2024-03-07 DIAGNOSIS — Z860100 Personal history of colon polyps, unspecified: Secondary | ICD-10-CM

## 2024-03-07 MED ORDER — SUFLAVE 178.7 G PO SOLR: 1.0000 | Freq: Once | ORAL | 0 refills | Status: AC

## 2024-03-07 NOTE — Progress Notes (Signed)
 No egg or soy allergy known to patient  No issues known to pt with past sedation with any surgeries or procedures Patient denies ever being told they had issues or difficulty with intubation  No FH of Malignant Hyperthermia Pt is not on diet pills Pt is not on  home 02  Pt is not on blood thinners  Pt has issues with constipation takes stool softner  and Miralax No A fib or A flutter Have any cardiac testing pending--no Pt can ambulate independently Pt denies use of chewing tobacco Discussed diabetic I weight loss medication holds Discussed NSAID holds Checked BMI Pt instructed to use Single[employer] or GoodRx for a price reduction on prep  Patient's chart reviewed by Cathlyn Parsons CNRA prior to previsit and patient appropriate for the [facility].  Pre visit completed and red dot placed by patient's name on their procedure day (on provider's schedule).

## 2024-03-23 ENCOUNTER — Emergency Department (HOSPITAL_BASED_OUTPATIENT_CLINIC_OR_DEPARTMENT_OTHER)

## 2024-03-23 ENCOUNTER — Emergency Department (HOSPITAL_BASED_OUTPATIENT_CLINIC_OR_DEPARTMENT_OTHER)
Admission: EM | Admit: 2024-03-23 | Discharge: 2024-03-23 | Disposition: A | Discharge status: Home / Self Care | Attending: Emergency Medicine | Admitting: Emergency Medicine

## 2024-03-23 ENCOUNTER — Other Ambulatory Visit: Payer: Self-pay

## 2024-03-23 ENCOUNTER — Encounter (HOSPITAL_BASED_OUTPATIENT_CLINIC_OR_DEPARTMENT_OTHER): Payer: Self-pay | Admitting: Emergency Medicine

## 2024-03-23 DIAGNOSIS — K529 Noninfective gastroenteritis and colitis, unspecified: Secondary | ICD-10-CM | POA: Insufficient documentation

## 2024-03-23 DIAGNOSIS — R197 Diarrhea, unspecified: Secondary | ICD-10-CM | POA: Diagnosis not present

## 2024-03-23 DIAGNOSIS — R1030 Lower abdominal pain, unspecified: Secondary | ICD-10-CM | POA: Diagnosis not present

## 2024-03-23 DIAGNOSIS — Z794 Long term (current) use of insulin: Secondary | ICD-10-CM | POA: Insufficient documentation

## 2024-03-23 DIAGNOSIS — Z7984 Long term (current) use of oral hypoglycemic drugs: Secondary | ICD-10-CM | POA: Diagnosis not present

## 2024-03-23 DIAGNOSIS — N200 Calculus of kidney: Secondary | ICD-10-CM | POA: Diagnosis not present

## 2024-03-23 DIAGNOSIS — E119 Type 2 diabetes mellitus without complications: Secondary | ICD-10-CM | POA: Insufficient documentation

## 2024-03-23 DIAGNOSIS — Z9049 Acquired absence of other specified parts of digestive tract: Secondary | ICD-10-CM | POA: Diagnosis not present

## 2024-03-23 DIAGNOSIS — K5730 Diverticulosis of large intestine without perforation or abscess without bleeding: Secondary | ICD-10-CM | POA: Diagnosis not present

## 2024-03-23 DIAGNOSIS — D72829 Elevated white blood cell count, unspecified: Secondary | ICD-10-CM | POA: Diagnosis not present

## 2024-03-23 LAB — URINALYSIS, ROUTINE W REFLEX MICROSCOPIC
Bilirubin Urine: NEGATIVE
Glucose, UA: 100 mg/dL — AB
Hgb urine dipstick: NEGATIVE
Ketones, ur: >=80 mg/dL — AB
Leukocytes,Ua: NEGATIVE
Nitrite: NEGATIVE
Protein, ur: NEGATIVE mg/dL
Specific Gravity, Urine: 1.025 (ref 1.005–1.030)
pH: 5.5 (ref 5.0–8.0)

## 2024-03-23 LAB — CBC WITH DIFFERENTIAL/PLATELET
Abs Immature Granulocytes: 0.05 10*3/uL (ref 0.00–0.07)
Basophils Absolute: 0 10*3/uL (ref 0.0–0.1)
Basophils Relative: 0 %
Eosinophils Absolute: 0.1 10*3/uL (ref 0.0–0.5)
Eosinophils Relative: 1 %
HCT: 42.1 % (ref 36.0–46.0)
Hemoglobin: 14.5 g/dL (ref 12.0–15.0)
Immature Granulocytes: 0 %
Lymphocytes Relative: 5 %
Lymphs Abs: 0.8 10*3/uL (ref 0.7–4.0)
MCH: 29.9 pg (ref 26.0–34.0)
MCHC: 34.4 g/dL (ref 30.0–36.0)
MCV: 86.8 fL (ref 80.0–100.0)
Monocytes Absolute: 1 10*3/uL (ref 0.1–1.0)
Monocytes Relative: 7 %
Neutro Abs: 13.4 10*3/uL — ABNORMAL HIGH (ref 1.7–7.7)
Neutrophils Relative %: 87 %
Platelets: 354 10*3/uL (ref 150–400)
RBC: 4.85 MIL/uL (ref 3.87–5.11)
RDW: 12.5 % (ref 11.5–15.5)
WBC: 15.4 10*3/uL — ABNORMAL HIGH (ref 4.0–10.5)
nRBC: 0 % (ref 0.0–0.2)

## 2024-03-23 LAB — COMPREHENSIVE METABOLIC PANEL WITH GFR
ALT: 18 U/L (ref 0–44)
AST: 19 U/L (ref 15–41)
Albumin: 3.9 g/dL (ref 3.5–5.0)
Alkaline Phosphatase: 79 U/L (ref 38–126)
Anion gap: 13 (ref 5–15)
BUN: 19 mg/dL (ref 6–20)
CO2: 26 mmol/L (ref 22–32)
Calcium: 9.7 mg/dL (ref 8.9–10.3)
Chloride: 97 mmol/L — ABNORMAL LOW (ref 98–111)
Creatinine, Ser: 0.81 mg/dL (ref 0.44–1.00)
GFR, Estimated: >60 mL/min (ref >60)
Glucose, Bld: 253 mg/dL — ABNORMAL HIGH (ref 70–99)
Potassium: 4 mmol/L (ref 3.5–5.1)
Sodium: 136 mmol/L (ref 135–145)
Total Bilirubin: 0.8 mg/dL (ref 0.0–1.2)
Total Protein: 8.1 g/dL (ref 6.5–8.1)

## 2024-03-23 LAB — RESP PANEL BY RT-PCR (RSV, FLU A&B, COVID)  RVPGX2
Influenza A by PCR: NEGATIVE
Influenza B by PCR: NEGATIVE
Resp Syncytial Virus by PCR: NEGATIVE
SARS Coronavirus 2 by RT PCR: NEGATIVE

## 2024-03-23 LAB — CBG MONITORING, ED: Glucose-Capillary: 230 mg/dL — ABNORMAL HIGH (ref 70–99)

## 2024-03-23 LAB — LIPASE, BLOOD: Lipase: 37 U/L (ref 11–51)

## 2024-03-23 MED ORDER — PANTOPRAZOLE SODIUM 40 MG IV SOLR
40.0000 mg | Freq: Once | INTRAVENOUS | Status: AC
  Administered 2024-03-23: 40 mg via INTRAVENOUS
  Filled 2024-03-23: qty 10

## 2024-03-23 MED ORDER — ONDANSETRON HCL 4 MG/2ML IJ SOLN
4.0000 mg | Freq: Once | INTRAMUSCULAR | Status: AC
  Administered 2024-03-23: 4 mg via INTRAVENOUS
  Filled 2024-03-23: qty 2

## 2024-03-23 MED ORDER — SODIUM CHLORIDE 0.9 % IV BOLUS
1000.0000 mL | Freq: Once | INTRAVENOUS | Status: AC
  Administered 2024-03-23: 1000 mL via INTRAVENOUS

## 2024-03-23 MED ORDER — ONDANSETRON HCL 4 MG PO TABS: 4.0000 mg | ORAL_TABLET | Freq: Four times a day (QID) | ORAL | 0 refills | Status: DC

## 2024-03-23 NOTE — ED Provider Notes (Signed)
 [facility] Provider Note   CSN: [id number] Arrival date & time: 03/23/24  1227     History  Chief Complaint  Patient presents with   Emesis   Diarrhea    Nicole Mata is a 63 y.o. female history of diabetes, GERD, kidney stones, IBS presented after nausea vomiting diarrhea that began at 9:00 today.  Patient states that she had country ham and then immediately started having nausea vomiting diarrhea.  Patient notes that she did have some emesis on Friday in which she had 1 episode and felt better afterwards but thinks this is from the Trulicity  as this is a common side effect for her.  Patient denies any fevers chest pain shortness of breath.  Patient dates she had 10 episodes of emesis earlier today.  Patient denies any sick contacts.  Patient states that she feels when she urinates she has a hard time urinating.    Home Medications Prior to Admission medications   Medication Sig Start Date End Date Taking? Authorizing Provider  ondansetron  (ZOFRAN ) 4 MG tablet Take 1 tablet (4 mg total) by mouth every 6 (six) hours. 03/23/24  Yes Denese Finn, PA-C  CONTOUR TEST test strip FOR CHECKING BLOOD SUGAR ONCE DAILY 03/01/20   Cydney Draft, MD  DEXILANT  60 MG capsule Take 1 capsule (60 mg total) by mouth daily before breakfast. 12/11/23   Esterwood, Amy S, PA-C  dicyclomine  (BENTYL ) 10 MG capsule Take 1 capsule (10 mg total) by mouth every 8 (eight) hours as needed. 09/11/23   Kennedy-Smith, Colleen M, NP  Dulaglutide  (TRULICITY ) 1.5 MG/0.5ML SOAJ Inject 1.5 mg into the skin once a week. 01/01/24   Breeback, Jade L, PA-C  famotidine  (PEPCID ) 20 MG tablet Take 1 tablet (20 mg total) by mouth at bedtime. 12/11/23   Esterwood, Amy S, PA-C  fluticasone  (FLONASE ) 50 MCG/ACT nasal spray Place 2 sprays into the nose daily. Patient not taking: Reported on 03/07/2024 09/10/13   Breeback, Jade L, PA-C  gabapentin  (NEURONTIN ) 100 MG capsule Take 1  capsule (100 mg total) by mouth at bedtime. 10/15/23   Cydney Draft, MD  Insulin  Pen Needle (B-D ULTRAFINE III SHORT PEN) 31G X 8 MM MISC INJECT INTO THE SKIN ONCE DAILY AS DIRECTED 07/27/21   Cydney Draft, MD  ketotifen (ZADITOR) 0.025 % ophthalmic solution Place 1 drop into both eyes every morning.    [provider]  Lancets (FREESTYLE) lancets For testing blood sugars up to 2 times daily. DX: E11.9. Please make sure had the dispenser/cartridge. Thank you 02/02/20   Cydney Draft, MD  metFORMIN  (GLUCOPHAGE -XR) 500 MG 24 hr tablet TAKE 2 TABLETS(1000 MG) BY MOUTH DAILY WITH BREAKFAST 03/05/23   Cydney Draft, MD  Misc Natural Products (APPLE CIDER VINEGAR DIET) TABS Take 1 tablet by mouth as needed. [place] ACV    [provider]  ondansetron  (ZOFRAN -ODT) 8 MG disintegrating tablet Take 1 tablet (8 mg total) by mouth every 8 (eight) hours as needed. 12/31/23   Breeback, Jade L, PA-C      Allergies    Penicillins, Sulfa drugs cross reactors, Contrast media [iodinated contrast media], Atorvastatin , Gabapentin , Januvia  [sitagliptin ], Morphine and codeine, Prednisone, Azithromycin, Hydrocodone , and Invokana  [canagliflozin ]    Review of Systems   Review of Systems  Gastrointestinal:  Positive for diarrhea and vomiting.    Physical Exam Updated Vital Signs BP 133/75 (BP Location: Right Arm)   Pulse 90   Temp 98.1  F (36.7 C) (Oral)   Resp 16   Ht 5\' 7"  (1.702 m)   Wt 83.9 kg   LMP 12/04/2013   SpO2 98%   BMI 28.97 kg/m  Physical Exam Vitals reviewed.  Constitutional:      General: She is not in acute distress. HENT:     Head: Normocephalic and atraumatic.  Eyes:     Extraocular Movements: Extraocular movements intact.     Conjunctiva/sclera: Conjunctivae normal.     Pupils: Pupils are equal, round, and reactive to light.  Cardiovascular:     Rate and Rhythm: Normal rate and regular rhythm.     Pulses: Normal pulses.     Heart sounds:  Normal heart sounds.     Comments: 2+ bilateral radial/dorsalis pedis pulses with regular rate Pulmonary:     Effort: Pulmonary effort is normal. No respiratory distress.     Breath sounds: Normal breath sounds.  Abdominal:     Palpations: Abdomen is soft.     Tenderness: There is abdominal tenderness (Suprapubic). There is no guarding or rebound.  Musculoskeletal:        General: Normal range of motion.     Cervical back: Normal range of motion and neck supple.     Comments: 5 out of 5 bilateral grip/leg extension strength  Skin:    General: Skin is warm and dry.     Capillary Refill: Capillary refill takes less than 2 seconds.  Neurological:     General: No focal deficit present.     Mental Status: She is alert and oriented to person, place, and time.     Comments: Sensation intact in all 4 limbs  Psychiatric:        Mood and Affect: Mood normal.     ED Results / Procedures / Treatments   Labs (all labs ordered are listed, but only abnormal results are displayed) Labs Reviewed  CBC WITH DIFFERENTIAL/PLATELET - Abnormal; Notable for the following components:      Result Value   WBC 15.4 (*)    Neutro Abs 13.4 (*)    All other components within normal limits  COMPREHENSIVE METABOLIC PANEL WITH GFR - Abnormal; Notable for the following components:   Chloride 97 (*)    Glucose, Bld 253 (*)    All other components within normal limits  URINALYSIS, ROUTINE W REFLEX MICROSCOPIC - Abnormal; Notable for the following components:   Glucose, UA 100 (*)    Ketones, ur >=80 (*)    All other components within normal limits  CBG MONITORING, ED - Abnormal; Notable for the following components:   Glucose-Capillary 230 (*)    All other components within normal limits  RESP PANEL BY RT-PCR (RSV, FLU A&B, COVID)  [id number]  LIPASE, BLOOD    EKG None  Radiology CT ABDOMEN PELVIS WO CONTRAST Result Date: 03/23/2024 CLINICAL DATA:  low abd pain EXAM: CT ABDOMEN AND PELVIS WITHOUT  CONTRAST TECHNIQUE: Multidetector CT imaging of the abdomen and pelvis was performed following the standard protocol without IV contrast. RADIATION DOSE REDUCTION: This exam was performed according to the departmental dose-optimization program which includes automated exposure control, adjustment of the mA and/or kV according to patient size and/or use of iterative reconstruction technique. COMPARISON:  09/17/2023 FINDINGS: Lower chest: Trace pericardial fluid, stable. No pericardial effusion. Visualized lung bases clear. Hepatobiliary: No focal liver abnormality is seen. Status post cholecystectomy. No biliary dilatation. Pancreas: Unremarkable. No pancreatic ductal dilatation or surrounding inflammatory changes. Spleen: Normal in size without focal  abnormality. Adrenals/Urinary Tract: No adrenal mass. Symmetric renal contours without hydronephrosis. 2 mm calculus in the lower pole left renal collecting system. 1 mm calculus in the right lower pole collecting system. Urinary bladder is partially distended. Stomach/Bowel: Stomach is partially distended, without acute finding. Small bowel relatively decompressed. Normal appendix. The colon is nondilated, with a few scattered sigmoid diverticula; no adjacent inflammatory change. Vascular/Lymphatic: No significant vascular findings are present. No enlarged abdominal or pelvic lymph nodes. Reproductive: Uterus and bilateral adnexa are unremarkable. Other: No ascites.  No free air. Musculoskeletal: No acute or significant osseous findings. IMPRESSION: 1. No acute findings. 2. Bilateral nephrolithiasis. 3. Sigmoid diverticulosis. Electronically Signed   By: Nicoletta Barrier M.D.   On: 03/23/2024 15:56    Procedures Procedures    Medications Ordered in ED Medications  ondansetron  (ZOFRAN ) injection 4 mg (4 mg Intravenous Given 03/23/24 1300)  sodium chloride  0.9 % bolus 1,000 mL (0 mLs Intravenous Stopped 03/23/24 1406)  pantoprazole  (PROTONIX ) injection 40 mg (40 mg  Intravenous Given 03/23/24 1300)    ED Course/ Medical Decision Making/ A&P                                 Medical Decision Making Amount and/or Complexity of Data Reviewed Labs: ordered. Radiology: ordered.  Risk Prescription drug management.   KAMARIYAH TIMBERLAKE 63 y.o. presented today for abdominal pain.  Working DDx that I considered at this time includes, but not limited to, gastroenteritis, colitis, small bowel obstruction, appendicitis, cholecystitis, hepatobiliary pathology, gastritis, PUD, ACS, aortic dissection, diverticulosis/diverticulitis, pancreatitis, nephrolithiasis, medication induced, AAA, UTI, pyelonephritis.  R/o DDx: colitis, small bowel obstruction, appendicitis, cholecystitis, hepatobiliary pathology, gastritis, PUD, ACS, aortic dissection, diverticulosis/diverticulitis, pancreatitis, nephrolithiasis, medication induced, AAA, UTI, pyelonephritis: These are considered less likely due to history of present illness, physical exam, labs/imaging findings.  Review of prior external notes: 01/30/2024 office visit  Unique Tests and My Independent Interpretation:  CBC with differential: Leukocytosis 15.4 CMP: Hyperglycemia 253 Lipase: Unremarkable CBG: 230 UA: Unremarkable Respiratory panel: Negative CT Abd/Pelvis wo contrast: No acute pathology  Social Determinants of Health: none  Discussion with Independent Historian:  Husband  Discussion of Management of Tests: None  Risk: Medium: prescription drug management  Risk Stratification Score: none  Staffed with Inga Manges, DO   Plan: On exam patient was no acute distress with stable vitals.  Patient does have some suprapubic tenderness without peritoneal signs on exam but otherwise reassuring exam.  Patient symptoms began shortly after eating a contra hand this morning when she had nausea vomiting diarrhea but does note that she had an episode emesis on Friday as well.  Will get labs and fluids along with Zofran  and  Protonix .  Labs do show leukocytosis and after discussion with the attending we will do CT scan without contrast due to contrast allergy.  CT scan is unremarkable.  Patient's clinical picture is consistent with gastroenteritis and patient states she feels okay going home.  Will p.o. challenge.  Patient was discussed with p.o. challenge and safe to go home.  Will send for Zofran  and have her follow-up closely with her primary care doctor.  I stressed the importance of fluids and eating food as tolerated.  Patient was given return precautions. Patient stable for discharge at this time.  Patient verbalized understanding of plan.  This chart was dictated using voice recognition software.  Despite best efforts to proofread,  errors can occur which can change the  documentation meaning.         Final Clinical Impression(s) / ED Diagnoses Final diagnoses:  Gastroenteritis    Rx / DC Orders ED Discharge Orders          Ordered    ondansetron  (ZOFRAN ) 4 MG tablet  Every 6 hours        03/23/24 1610              Mattias Walmsley T, PA-C 03/23/24 1619    Albertus Hughs, DO 03/23/24 1943

## 2024-03-23 NOTE — ED Notes (Signed)
 CBG 230. RN notified.

## 2024-03-23 NOTE — ED Notes (Signed)
PO challenge tolerated well.

## 2024-03-23 NOTE — Discharge Instructions (Signed)
 Please follow-up with your primary care provider in regards to symptoms and ER visit. Today your labs are reassuring you most likely a viral illness. Please remain hydrated and eat food as tolerated over the next few days.  You may consider a brat diet that includes bananas, rice, applesauce, toast. I prescribe Zofran to help with your nausea however you may also use Imodium over-the-counter to help with the diarrhea. You may use Tylenol every 6 hours as needed for pain. If symptoms change or worsen please return to the ER.

## 2024-03-23 NOTE — ED Notes (Signed)
 ED Provider at bedside.

## 2024-03-23 NOTE — ED Triage Notes (Addendum)
 Pt reports NVD after breakfast this morning; had ODT Zofran  at 0930

## 2024-03-25 ENCOUNTER — Ambulatory Visit (INDEPENDENT_AMBULATORY_CARE_PROVIDER_SITE_OTHER): Admitting: Family Medicine

## 2024-03-25 ENCOUNTER — Encounter: Payer: Self-pay | Admitting: Family Medicine

## 2024-03-25 VITALS — BP 110/62 | HR 89 | Ht 67.0 in | Wt 185.0 lb

## 2024-03-25 DIAGNOSIS — K529 Noninfective gastroenteritis and colitis, unspecified: Secondary | ICD-10-CM | POA: Diagnosis not present

## 2024-03-25 DIAGNOSIS — Z7984 Long term (current) use of oral hypoglycemic drugs: Secondary | ICD-10-CM | POA: Diagnosis not present

## 2024-03-25 DIAGNOSIS — Z7985 Long-term (current) use of injectable non-insulin antidiabetic drugs: Secondary | ICD-10-CM

## 2024-03-25 DIAGNOSIS — E118 Type 2 diabetes mellitus with unspecified complications: Secondary | ICD-10-CM

## 2024-03-25 MED ORDER — TRULICITY 0.75 MG/0.5ML ~~LOC~~ SOAJ: 0.7500 mg | SUBCUTANEOUS | 0 refills | Status: DC

## 2024-03-25 NOTE — Assessment & Plan Note (Signed)
 I really think she was experiencing gastroenteritis on top of the Trulicity .  But since she is going to have to hold it for her upcoming colonoscopy we could always restart at the lower dose for couple of weeks before going back up to the 1.5 mg dose which I think is reasonable so go ahead and send over the 0.75 mg.  She is only missed a week before this last injection so it would be a little unusual for her hide to have such a brisk reaction so again I still think she may have had some type of viral illness etc.

## 2024-03-25 NOTE — Progress Notes (Signed)
 Pt was advised that the Trulicity  may be the cause of her GI issues.

## 2024-03-25 NOTE — Progress Notes (Signed)
 Acute Office Visit  Subjective:     Patient ID: Nicole Mata, female    DOB: [birth year], 63 y.o.   MRN: [id number]  Chief Complaint  Patient presents with   Hospitalization Follow-up    HPI Patient is in today for ED follow-up.  She had missed her Trulicity  for a week because the pharmacy could not get it in when she was finally able to get the prescription she gave herself her usual dose of 1.5 mg which does oftentimes make her nauseated but she does not usually vomit with it.  It also tends to cause constipation and not diarrhea.  But she says the next morning around 2 AM she woke up and actually vomited once but then actually felt better went to work and did okay.  Then the next day on Sunday she started feeling nauseated ate a little bit and then started vomiting and having diarrhea.  She took 2 Pepto-Bismol and then eventually went to the emergency room she was given some IV fluids and had a negative CT.  The diarrhea slowed down a little bit though she still had a couple more episodes after the ED visit.  She has not vomited again since then.  She says she just does not feel great and feels weak.  She wonders if it could be a side effect of the Trulicity .  ROS      Objective:    BP 110/62   Pulse 89   Ht 5\' 7"  (1.702 m)   Wt 184 lb 15.5 oz (83.9 kg)   LMP 12/04/2013   SpO2 99%   BMI 28.97 kg/m    Physical Exam Vitals and nursing note reviewed.  Constitutional:      Appearance: Normal appearance.  HENT:     Head: Normocephalic and atraumatic.  Eyes:     Conjunctiva/sclera: Conjunctivae normal.  Cardiovascular:     Rate and Rhythm: Normal rate and regular rhythm.  Pulmonary:     Effort: Pulmonary effort is normal.     Breath sounds: Normal breath sounds.  Skin:    General: Skin is warm and dry.  Neurological:     Mental Status: She is alert.  Psychiatric:        Mood and Affect: Mood normal.     No results found for any visits on 03/25/24.       Assessment & Plan:   Problem List Items Addressed This Visit       Endocrine   Controlled diabetes mellitus type 2 with complications (HCC)   I really think she was experiencing gastroenteritis on top of the Trulicity .  But since she is going to have to hold it for her upcoming colonoscopy we could always restart at the lower dose for couple of weeks before going back up to the 1.5 mg dose which I think is reasonable so go ahead and send over the 0.75 mg.  She is only missed a week before this last injection so it would be a little unusual for her hide to have such a brisk reaction so again I still think she may have had some type of viral illness etc.      Relevant Medications   Dulaglutide  (TRULICITY ) 0.75 MG/0.5ML SOAJ   Other Visit Diagnoses       Gastroenteritis    -  Primary       Okay to progress diet a little bit today still continue to avoid anything really greasy spicy or dairy for a  couple more days.  Meds ordered this encounter  Medications   Dulaglutide  (TRULICITY ) 0.75 MG/0.5ML SOAJ    Sig: Inject 0.75 mg into the skin once a week.    Dispense:  2 mL    Refill:  0    She is going to have to hold her Trulicity  for a procedure and so I want to restart her back on 0.75 afterwards and then work back up to the 1.5.    No follow-ups on file.  Duaine German, MD

## 2024-03-27 ENCOUNTER — Encounter: Payer: Self-pay | Admitting: Internal Medicine

## 2024-04-03 ENCOUNTER — Encounter: Payer: Self-pay | Admitting: Internal Medicine

## 2024-04-03 ENCOUNTER — Ambulatory Visit: Payer: BC Managed Care – PPO | Admitting: Internal Medicine

## 2024-04-03 VITALS — BP 116/60 | HR 70 | Temp 97.9°F | Resp 16 | Ht 67.0 in | Wt 185.0 lb

## 2024-04-03 DIAGNOSIS — D128 Benign neoplasm of rectum: Secondary | ICD-10-CM | POA: Diagnosis not present

## 2024-04-03 DIAGNOSIS — D123 Benign neoplasm of transverse colon: Secondary | ICD-10-CM | POA: Diagnosis not present

## 2024-04-03 DIAGNOSIS — Z860100 Personal history of colon polyps, unspecified: Secondary | ICD-10-CM

## 2024-04-03 DIAGNOSIS — K648 Other hemorrhoids: Secondary | ICD-10-CM

## 2024-04-03 DIAGNOSIS — D122 Benign neoplasm of ascending colon: Secondary | ICD-10-CM

## 2024-04-03 DIAGNOSIS — K5730 Diverticulosis of large intestine without perforation or abscess without bleeding: Secondary | ICD-10-CM

## 2024-04-03 DIAGNOSIS — Z1211 Encounter for screening for malignant neoplasm of colon: Secondary | ICD-10-CM

## 2024-04-03 MED ORDER — SODIUM CHLORIDE 0.9 % IV SOLN: 500.0000 mL | Freq: Once | INTRAVENOUS | Status: DC

## 2024-04-03 NOTE — Progress Notes (Signed)
 Expand All Collapse All    Subjective:    Subjective Patient ID: Nicole Mata, female    DOB: [birth year], 63 y.o.   MRN: [id number]   HPI Nicole Mata is a pleasant 63 year old white female established with Dr. Elvin Mata, recently seen by Nicole Hitt, NP in October 2024 with complaints of abdominal pain/pressure, nausea heartburn.  She comes in today for follow-up. She had been taking Rybelsus  at that time and it was felt that most of her symptoms may be attributed to Rybelsus .  However she did undergo repeat CT scan of the abdomen pelvis with contrast in October 2024 that showed her to be status postcholecystectomy, there was a punctate left lower pole kidney stone and sigmoid diverticulosis, otherwise unremarkable. She had Pepcid  added to her regimen 20 mg at bedtime and continued on Dexilant  60 mg p.o. every morning. She was also given a prescription for Bentyl  and says she has only been using that on a as needed basis. She has since come off of Rybelsus  and was started on Trulicity  by her PCP over the past month.  She says so far she is doing well on this and has not been bothered by many GI symptoms but is still increasing her dose.  Occasionally will have some episodes of nausea but not nearly as bad as what she had previously had she did have a bad episode of constipation around Christmas, and did not have a bowel movement for about 5 days.  This required multiple doses of MiraLAX , stool softeners prune juice etc.  She is feeling better at this point.   Her last colonoscopy had been done in February 2020, with finding of multiple diverticuli in the left colon, she had 3 small polyps removed all 2 to 3 mm in size and was noted to have internal hemorrhoids.  Biopsy showed 2 tubular adenomas and 1 hyperplastic polyp and she is indicated for follow-up at this point.   Her other medical problems include diabetes mellitus, prior history of H. pylori treated and eradicated, peripheral neuropathy and  generalized anxiety disorder.     Review of Systems Pertinent positive and negative review of systems were noted in the above HPI section.  All other review of systems was otherwise negative.        Outpatient Encounter Medications as of 12/11/2023  Medication Sig   CONTOUR TEST test strip FOR CHECKING BLOOD SUGAR ONCE DAILY   dicyclomine  (BENTYL ) 10 MG capsule Take 1 capsule (10 mg total) by mouth every 8 (eight) hours as needed.   Dulaglutide  (TRULICITY ) 0.75 MG/0.5ML SOAJ Inject 0.75 mg into the skin once a week.   Dulaglutide  (TRULICITY ) 1.5 MG/0.5ML SOAJ Inject 1.5 mg into the skin once a week.   Dulaglutide  (TRULICITY ) 3 MG/0.5ML SOAJ Inject 3 mg as directed once a week.   fluticasone  (FLONASE ) 50 MCG/ACT nasal spray Place 2 sprays into the nose daily.   gabapentin  (NEURONTIN ) 100 MG capsule Take 1 capsule (100 mg total) by mouth at bedtime.   Insulin  Pen Needle (B-D ULTRAFINE III SHORT PEN) 31G X 8 MM MISC INJECT INTO THE SKIN ONCE DAILY AS DIRECTED   ketotifen (ZADITOR) 0.025 % ophthalmic solution Place 1 drop into both eyes every morning.   Lancets (FREESTYLE) lancets For testing blood sugars up to 2 times daily. DX: E11.9. Please make sure had the dispenser/cartridge. Thank you   metFORMIN  (GLUCOPHAGE -XR) 500 MG 24 hr tablet TAKE 2 TABLETS(1000 MG) BY MOUTH DAILY WITH BREAKFAST   Misc Natural Products (  APPLE CIDER VINEGAR DIET) TABS Take 1 tablet by mouth as needed. [place] ACV   ondansetron  (ZOFRAN -ODT) 4 MG disintegrating tablet Take 1 tablet (4 mg total) by mouth every 8 (eight) hours as needed.   [DISCONTINUED] DEXILANT  60 MG capsule Take 1 capsule (60 mg total) by mouth daily before breakfast.   [DISCONTINUED] famotidine  (PEPCID ) 20 MG tablet TAKE 1 TABLET(20 MG) BY MOUTH AT BEDTIME   DEXILANT  60 MG capsule Take 1 capsule (60 mg total) by mouth daily before breakfast.   famotidine  (PEPCID ) 20 MG tablet Take 1 tablet (20 mg total) by mouth at bedtime.      No facility-administered  encounter medications on file as of 12/11/2023.      Allergies       Allergies  Allergen Reactions   Penicillins Anaphylaxis   Sulfa Drugs Cross Reactors Anaphylaxis   Contrast Media [Iodinated Contrast Media] Hives   Atorvastatin  Other (See Comments)      Muscle aches   Gabapentin  Other (See Comments)      GERD   Januvia  [Sitagliptin ] Other (See Comments)      Hot flashes and flushed and dizzy   Morphine And Codeine Nausea And Vomiting      Pt chooses not to take it   Prednisone        Other reaction(s): Confusion   Azithromycin Other (See Comments)      Other reaction(s): Other Makes her feel really crazy. Makes her feel really crazy.   Hydrocodone  Nausea And Vomiting   Invokana  [Canagliflozin ] Rash      Rash starting on her face          Patient Active Problem List    Diagnosis Date Noted   Kidney stones 07/01/2019   Lumbar spondylosis 06/21/2017   Diabetic peripheral neuropathy (HCC) 10/04/2016   Hyperlipidemia 06/18/2015   Chondromalacia patellae 12/15/2014   History of kidney stones 12/15/2014   Controlled diabetes mellitus type 2 with complications (HCC) 11/25/2013   Menopausal symptom 06/04/2013   Umbilical hernia 10/14/2012   Small bowel disease 10/14/2012   Disorder of intestine 10/14/2012   Cervical radiculopathy at C7 01/16/2011   Brachial neuritis 01/16/2011   Generalized anxiety disorder 04/15/2010   Stiffness of joint, not elsewhere classified, other specified site 01/25/2010   Allergic rhinitis 01/25/2010   Vitamin D deficiency 10/12/2009   Other B-complex deficiencies 10/12/2009   Pain in joints 10/07/2009   Rosacea 08/23/2009   Anxiety state 08/23/2009   Low back pain 04/22/2009   GERD 12/17/2008   Diaphragmatic hernia 12/17/2008   IRRITABLE BOWEL SYNDROME 12/17/2008   HELICOBACTER PYLORI GASTRITIS, HX OF 12/17/2008    Social History         Socioeconomic History   Marital status: Married      Spouse name: Not on file   Number of  children: 0   Years of education: Not on file   Highest education level: Not on file  Occupational History   Occupation: IRA specialist  Tobacco Use   Smoking status: Never   Smokeless tobacco: Never  Vaping Use   Vaping status: Never Used  Substance and Sexual Activity   Alcohol use: Yes      Comment: very rarely   Drug use: No   Sexual activity: Yes      Partners: Male      Comment: divorced, no kids, customer service, 2 caffeine drinks daily.  Other Topics Concern   Not on file  Social History Narrative    1  can of soda a day.  No regular exercise.     Social Drivers of Acupuncturist Strain: Not on file  Food Insecurity: Not on file  Transportation Needs: Not on file  Physical Activity: Not on file  Stress: Not on file  Social Connections: Unknown (04/14/2022)    Received from [facility], [facility]    Social Network     Social Network: Not on file  Intimate Partner Violence: Not At Risk (05/28/2023)    Received from [facility], [facility]    HITS     Over the last 12 months how often did your partner physically hurt you?: Never     Over the last 12 months how often did your partner insult you or talk down to you?: Never     Over the last 12 months how often did your partner threaten you with physical harm?: Never     Over the last 12 months how often did your partner scream or curse at you?: Never      Ms. Minkoff's family history includes Diabetes in her brother, maternal grandmother, and maternal uncle; Diabetes type II in her mother; Heart attack (age of onset: 74) in her father; Heart disease in an other family member; Hyperlipidemia in her father and mother; Hypertension in her father and mother; Ovarian cancer in her maternal aunt and maternal aunt.         Objective:    Objective    Vitals:    12/11/23 1054  BP: 110/72  Pulse: 98      Physical Exam Well-developed well-nourished older WF  in no acute distress.  Height,  Weight 191, BMI 29.9   HEENT; nontraumatic normocephalic, EOMI, PE R LA, sclera anicteric. Oropharynx;  not done Neck; supple, no JVD Cardiovascular; regular rate and rhythm with S1-S2, no murmur rub or gallop Pulmonary; Clear bilaterally Abdomen; soft, obese, non tender, nondistended, no palpable mass or hepatosplenomegaly, bowel sounds are active Rectal;not done Skin; benign exam, no jaundice rash or appreciable lesions Extremities; no clubbing cyanosis or edema skin warm and dry Neuro/Psych; alert and oriented x4, grossly nonfocal mood and affect appropriate            Assessment & Plan:    #53 63 year old white female with history of chronic GERD, currently stable on Dexilant  60 mg p.o. every morning and famotidine  20 mg p.o. nightly. When she had been seen previously in October 2024 she had a lot of complaints of abdominal pain pressure nausea increased heartburn and reflux all of which were likely secondary to Rybelsus  which she has since stopped with significant improvement in symptoms.   Now on Trulicity  over the past 1 month and tolerating well thus far.   #2 IBS-constipation predominant, had an episode of significant constipation about 2 weeks ago improved now using Benefiber daily and as needed MiraLAX    #3 history of adenomatous colon polyps-last colonoscopy 2020 and due for 5-year interval follow-up   #4 status post cholecystectomy #5.  Diverticulosis #6.  History of kidney stones #7.  Adult onset diabetes mellitus #8.  Anxiety disorder   Plan; continue Dexilant  60 mg p.o. every morning, refill sent Continue famotidine  20 mg p.o. nightly, refill sent Can continue Bentyl  10 mg daily to 3 times daily as needed Continue fiber supplement daily and as needed MiraLAX  Patient needs to be scheduled for follow-up Colonoscopy, however she would like to wait till after Tax season.  I have  asked her to call back in early February to get scheduled for Colonoscopy with Dr. Elvin Mata  in the [facility] in May or June.  She will need to hold Trulicity  for 1 week prior to the procedure. Follow-up in the office as needed.   Plan     Amy Starleen Eastern PA-C 12/11/2023     Cc: Duaine German D, *  Recent H&P as above.  No interval change in history or physical exam.  Now for colonoscopy

## 2024-04-03 NOTE — Progress Notes (Signed)
 Report to PACU, RN, vss, BBS= Clear.

## 2024-04-03 NOTE — Progress Notes (Signed)
 Called to room to assist during endoscopic procedure.  Patient ID and intended procedure confirmed with present staff. Received instructions for my participation in the procedure from the performing physician.

## 2024-04-03 NOTE — Progress Notes (Signed)
 Pt's states no medical or surgical changes since previsit or office visit.

## 2024-04-03 NOTE — Patient Instructions (Addendum)
-  Handout on polyps, hemorrhoids and diverticulosis provided -await pathology results -repeat colonoscopy for surveillance in 5 years recommended -Continue present medications    YOU HAD AN ENDOSCOPIC PROCEDURE TODAY AT THE [facility]:   Refer to the procedure report that was given to you for any specific questions about what was found during the examination.  If the procedure report does not answer your questions, please call your gastroenterologist to clarify.  If you requested that your care partner not be given the details of your procedure findings, then the procedure report has been included in a sealed envelope for you to review at your convenience later.  YOU SHOULD EXPECT: Some feelings of bloating in the abdomen. Passage of more gas than usual.  Walking can help get rid of the air that was put into your GI tract during the procedure and reduce the bloating. If you had a lower endoscopy (such as a colonoscopy or flexible sigmoidoscopy) you may notice spotting of blood in your stool or on the toilet paper. If you underwent a bowel prep for your procedure, you may not have a normal bowel movement for a few days.  Please Note:  You might notice some irritation and congestion in your nose or some drainage.  This is from the oxygen used during your procedure.  There is no need for concern and it should clear up in a day or so.  SYMPTOMS TO REPORT IMMEDIATELY:  Following lower endoscopy (colonoscopy or flexible sigmoidoscopy):  Excessive amounts of blood in the stool  Significant tenderness or worsening of abdominal pains  Swelling of the abdomen that is new, acute  Fever of 100F or higher  For urgent or emergent issues, a gastroenterologist can be reached at any hour by calling [phone]. Do not use MyChart messaging for urgent concerns.    DIET:  We do recommend a small meal at first, but then you may proceed to your regular diet.  Drink plenty of fluids but you should  avoid alcoholic beverages for 24 hours.  ACTIVITY:  You should plan to take it easy for the rest of today and you should NOT DRIVE or use heavy machinery until tomorrow (because of the sedation medicines used during the test).    FOLLOW UP: Our staff will call the number listed on your records the next business day following your procedure.  We will call around 7:15- 8:00 am to check on you and address any questions or concerns that you may have regarding the information given to you following your procedure. If we do not reach you, we will leave a message.     If any biopsies were taken you will be contacted by phone or by letter within the next 1-3 weeks.  Please call us  at [phone] if you have not heard about the biopsies in 3 weeks.    SIGNATURES/CONFIDENTIALITY: You and/or your care partner have signed paperwork which will be entered into your electronic medical record.  These signatures attest to the fact that that the information above on your After Visit Summary has been reviewed and is understood.  Full responsibility of the confidentiality of this discharge information lies with you and/or your care-partner.

## 2024-04-03 NOTE — Op Note (Signed)
 [facility] Patient Name: Nicole Mata Procedure Date: 04/03/2024 8:50 AM MRN: [id number] Endoscopist: Murel Arlington. Elvin Hammer , MD, [id number] Age: 63 Referring MD:  Date of Birth: 02-05-1961 Gender: Female Account #: [id number] Procedure:                Colonoscopy with cold snare polypectomy x 2; biopsy                            polypectomy x 1 Indications:              High risk colon cancer surveillance: Personal                            history of non-advanced adenomas. Previous                            examinations 2008, 2020 Medicines:                Monitored Anesthesia Care Procedure:                Pre-Anesthesia Assessment:                           - Prior to the procedure, a History and Physical                            was performed, and patient medications and                            allergies were reviewed. The patient's tolerance of                            previous anesthesia was also reviewed. The risks                            and benefits of the procedure and the sedation                            options and risks were discussed with the patient.                            All questions were answered, and informed consent                            was obtained. Prior Anticoagulants: The patient has                            taken no anticoagulant or antiplatelet agents. ASA                            Grade Assessment: II - A patient with mild systemic                            disease. After reviewing the risks and benefits,  the patient was deemed in satisfactory condition to                            undergo the procedure.                           After obtaining informed consent, the colonoscope                            was passed under direct vision. Throughout the                            procedure, the patient's blood pressure, pulse, and                            oxygen saturations were monitored  continuously. The                            Olympus Scope SN: [id number] was introduced through                            the anus and advanced to the the cecum, identified                            by appendiceal orifice and ileocecal valve. The                            ileocecal valve, appendiceal orifice, and rectum                            were photographed. The quality of the bowel                            preparation was excellent. The colonoscopy was                            performed without difficulty. The patient tolerated                            the procedure well. The bowel preparation used was                            SUPREP via split dose instruction. Scope In: 9:12:44 AM Scope Out: 9:27:36 AM Scope Withdrawal Time: 0 hours 13 minutes 13 seconds  Total Procedure Duration: 0 hours 14 minutes 52 seconds  Findings:                 Two polyps were found in the rectum and ascending                            colon. The polyps were 2 to 5 mm in size. These                            polyps were removed with a  cold snare. Resection                            and retrieval were complete.                           A 1 mm polyp was found in the transverse colon. The                            polyp was removed with a jumbo cold forceps.                            Resection and retrieval were complete.                           Multiple diverticula were found in the sigmoid                            colon.                           Internal hemorrhoids were found during retroflexion.                           The exam was otherwise without abnormality on                            direct and retroflexion views. Complications:            No immediate complications. Estimated blood loss:                            None. Estimated Blood Loss:     Estimated blood loss: none. Impression:               - Two 2 to 5 mm polyps in the rectum and in the                             ascending colon, removed with a cold snare.                            Resected and retrieved.                           - One 1 mm polyp in the transverse colon, removed                            with a jumbo cold forceps. Resected and retrieved.                           - Diverticulosis in the sigmoid colon.                           - Internal hemorrhoids.                           -  The examination was otherwise normal on direct                            and retroflexion views. Recommendation:           - Repeat colonoscopy in 5 years for surveillance.                           - Patient has a contact number available for                            emergencies. The signs and symptoms of potential                            delayed complications were discussed with the                            patient. Return to normal activities tomorrow.                            Written discharge instructions were provided to the                            patient.                           - Resume previous diet.                           - Continue present medications.                           - Await pathology results. Murel Arlington. Elvin Hammer, MD 04/03/2024 9:38:41 AM This report has been signed electronically.

## 2024-04-04 ENCOUNTER — Telehealth: Payer: Self-pay

## 2024-04-04 NOTE — Telephone Encounter (Signed)
  Follow up Call-     04/03/2024    7:28 AM  Call back number  Post procedure Call Back phone  # [phone]  Permission to leave phone message Yes     Patient questions:  Do you have a fever, pain , or abdominal swelling? No. Pain Score  0 *  Have you tolerated food without any problems? Yes.    Have you been able to return to your normal activities? Yes.    Do you have any questions about your discharge instructions: Diet   No. Medications  No. Follow up visit  No.  Do you have questions or concerns about your Care? No.  Actions: * If pain score is 4 or above: No action needed, pain <4.

## 2024-04-08 LAB — SURGICAL PATHOLOGY

## 2024-04-14 ENCOUNTER — Encounter (HOSPITAL_COMMUNITY): Payer: Self-pay

## 2024-04-21 ENCOUNTER — Ambulatory Visit: Payer: Self-pay | Admitting: Internal Medicine

## 2024-04-21 ENCOUNTER — Encounter: Payer: Self-pay | Admitting: Family Medicine

## 2024-04-21 ENCOUNTER — Ambulatory Visit: Payer: BC Managed Care – PPO | Admitting: Family Medicine

## 2024-04-21 VITALS — BP 119/72 | HR 96 | Ht 67.0 in | Wt 187.1 lb

## 2024-04-21 DIAGNOSIS — Z7985 Long-term (current) use of injectable non-insulin antidiabetic drugs: Secondary | ICD-10-CM | POA: Diagnosis not present

## 2024-04-21 DIAGNOSIS — E1142 Type 2 diabetes mellitus with diabetic polyneuropathy: Secondary | ICD-10-CM

## 2024-04-21 DIAGNOSIS — E118 Type 2 diabetes mellitus with unspecified complications: Secondary | ICD-10-CM

## 2024-04-21 DIAGNOSIS — R300 Dysuria: Secondary | ICD-10-CM

## 2024-04-21 LAB — POCT URINALYSIS DIP (CLINITEK)
Blood, UA: NEGATIVE
Glucose, UA: NEGATIVE mg/dL
Nitrite, UA: NEGATIVE
POC PROTEIN,UA: 30 — AB
Spec Grav, UA: >=1.03 — AB (ref 1.010–1.025)
Urobilinogen, UA: 0.2 U/dL
pH, UA: 5.5 (ref 5.0–8.0)

## 2024-04-21 LAB — POCT GLYCOSYLATED HEMOGLOBIN (HGB A1C): Hemoglobin A1C: 8.1 % — AB (ref 4.0–5.6)

## 2024-04-21 MED ORDER — NITROFURANTOIN MONOHYD MACRO 100 MG PO CAPS: 100.0000 mg | ORAL_CAPSULE | Freq: Two times a day (BID) | ORAL | 0 refills | Status: DC

## 2024-04-21 NOTE — Assessment & Plan Note (Signed)
 Only on gabapentin  for symptom control.  It is really important that we get her blood sugars under better control to reduce further nerve damage.

## 2024-04-21 NOTE — Assessment & Plan Note (Signed)
 A1c uncontrolled today at 8.1.  I told her to go ahead and go up to the 1.5 mg dose that she has at home.  And then if she is doing well after 4 injections to go ahead and go up to 3 mg she still has about 8 weeks left of the 3 mg dose that she had built up to previously.  Continue with current metformin .  Continue to work on [employer] and put some focus on that really work on increasing activity level.

## 2024-04-21 NOTE — Progress Notes (Signed)
 Established Patient Office Visit  Subjective  Patient ID: Nicole Mata, female    DOB: 11/19/1961  Age: 63 y.o. MRN: [id number]  Chief Complaint  Patient presents with   Diabetes   burning with urination    HPI  Diabetes - no hypoglycemic events. No wounds or sores that are not healing well. No increased thirst or urination. Checking glucose at home. Taking medications as prescribed without any side effects.  She notes she was out of her Trulicity  for a couple of weeks.  She initially had an issue getting it and then she had to hold it for her recent procedure.  And then she started back at the 0.75.  [place] just wants to make sure that everything is okay before she travels.  Also c/o of burning with urination on and off x 2 weeks.  + dysuria, no hematuria or fever.       ROS    Objective:     BP 119/72   Pulse 96   Ht 5\' 7"  (1.702 m)   Wt 187 lb 1.9 oz (84.9 kg)   LMP 12/04/2013   SpO2 98%   BMI 29.31 kg/m    Physical Exam Vitals and nursing note reviewed.  Constitutional:      Appearance: Normal appearance.  HENT:     Head: Normocephalic and atraumatic.  Eyes:     Conjunctiva/sclera: Conjunctivae normal.  Cardiovascular:     Rate and Rhythm: Normal rate and regular rhythm.  Pulmonary:     Effort: Pulmonary effort is normal.     Breath sounds: Normal breath sounds.  Skin:    General: Skin is warm and dry.  Neurological:     Mental Status: She is alert.  Psychiatric:        Mood and Affect: Mood normal.      Results for orders placed or performed in visit on 04/21/24  POCT HgB A1C  Result Value Ref Range   Hemoglobin A1C 8.1 (A) 4.0 - 5.6 %   HbA1c POC (<> result, manual entry)     HbA1c, POC (prediabetic range)     HbA1c, POC (controlled diabetic range)    POCT URINALYSIS DIP (CLINITEK)  Result Value Ref Range   Color, UA yellow yellow   Clarity, UA cloudy (A) clear   Glucose, UA negative negative mg/dL    Bilirubin, UA small (A) negative   Ketones, POC UA trace (5) (A) negative mg/dL   Spec Grav, UA >=4.010 (A) 1.010 - 1.025   Blood, UA negative negative   pH, UA 5.5 5.0 - 8.0   POC PROTEIN,UA =30 (A) negative, trace   Urobilinogen, UA 0.2 0.2 or 1.0 E.U./dL   Nitrite, UA Negative Negative   Leukocytes, UA Trace (A) Negative      The 10-year ASCVD risk score (Arnett DK, et al., 2019) is: 5.2%    Assessment & Plan:   Problem List Items Addressed This Visit       Endocrine   Diabetic peripheral neuropathy (HCC)   Only on gabapentin  for symptom control.  It is really important that we get her blood sugars under better control to reduce further nerve damage.      Controlled diabetes mellitus type 2 with complications (HCC) - Primary   A1c uncontrolled today at 8.1.  I told her to go ahead and go up to the 1.5 mg dose that she has at home.  And then if  she is doing well after 4 injections to go ahead and go up to 3 mg she still has about 8 weeks left of the 3 mg dose that she had built up to previously.  Continue with current metformin .  Continue to work on [employer] and put some focus on that really work on increasing activity level.      Relevant Orders   POCT HgB A1C (Completed)   Other Visit Diagnoses       Burning with urination       Relevant Orders   POCT URINALYSIS DIP (CLINITEK) (Completed)   Urine Culture   WET PREP FOR TRICH, YEAST, CLUE      [place]-urinalysis negative for nitrites but positive for leukocytes since she is getting ready to leave town medical I did treat her with nitrofurantoin  and send for culture will call with results once available.  She was also concerned about the possibility of yeast infection so I also had her do a wet prep before she left today.  Return in about 3 months (around 07/22/2024) for Diabetes follow-up.    Duaine German, MD

## 2024-04-22 ENCOUNTER — Ambulatory Visit: Payer: Self-pay | Admitting: Family Medicine

## 2024-04-22 NOTE — Progress Notes (Signed)
 They cancel her wet prep. Says no specimen received.

## 2024-04-24 LAB — URINE CULTURE: Organism ID, Bacteria: NO GROWTH

## 2024-04-24 LAB — WET PREP FOR TRICH, YEAST, CLUE

## 2024-04-24 NOTE — Progress Notes (Signed)
 Urine culture came back negative.  Please let her know that the wet prep was canceled.  So if she is still having symptoms then we need to have her come back by to do a vaginal swab.

## 2024-04-25 ENCOUNTER — Other Ambulatory Visit: Payer: Self-pay | Admitting: *Deleted

## 2024-04-25 DIAGNOSIS — E118 Type 2 diabetes mellitus with unspecified complications: Secondary | ICD-10-CM

## 2024-04-25 MED ORDER — METFORMIN HCL ER 500 MG PO TB24
1000.0000 mg | ORAL_TABLET | Freq: Every day | ORAL | 3 refills | Status: AC
  Filled 2025-01-08: qty 180, 90d supply, fill #0

## 2024-05-22 DIAGNOSIS — D485 Neoplasm of uncertain behavior of skin: Secondary | ICD-10-CM | POA: Diagnosis not present

## 2024-05-22 DIAGNOSIS — C44311 Basal cell carcinoma of skin of nose: Secondary | ICD-10-CM | POA: Diagnosis not present

## 2024-05-26 ENCOUNTER — Other Ambulatory Visit: Payer: Self-pay

## 2024-05-26 ENCOUNTER — Ambulatory Visit: Payer: Self-pay

## 2024-05-26 DIAGNOSIS — E119 Type 2 diabetes mellitus without complications: Secondary | ICD-10-CM | POA: Diagnosis not present

## 2024-05-26 LAB — HM DIABETES EYE EXAM

## 2024-05-26 MED ORDER — TRULICITY 1.5 MG/0.5ML ~~LOC~~ SOAJ: 1.5000 mg | SUBCUTANEOUS | 2 refills | Status: DC

## 2024-05-26 NOTE — Telephone Encounter (Signed)
 Patient agreed to go back down to the 1.5 mg. Still on current medication list. Sent to pharmacy.

## 2024-05-26 NOTE — Telephone Encounter (Signed)
 See if ok to just go back down on her dose.

## 2024-05-26 NOTE — Addendum Note (Signed)
 Addended by: BONNY JON DEL on: 05/26/2024 01:48 PM   Modules accepted: Orders

## 2024-05-26 NOTE — Addendum Note (Signed)
 Addended by: BONNY JON DEL on: 05/26/2024 01:50 PM   Modules accepted: Orders

## 2024-05-26 NOTE — Telephone Encounter (Signed)
 FYI Only or Action Required?: Action required by provider: clinical question for provider.  Patient was last seen in primary care on 04/21/2024 by Alvan Dorothyann BIRCH, MD. Called Nurse Triage reporting No chief complaint on file.. Symptoms began several weeks ago. Interventions attempted: Nothing. Symptoms are: gradually worsening.  Triage Disposition: Call PCP When Office is Open  Patient/caregiver understands and will follow disposition?: Yes                             Copied from CRM [id number]. Topic: Clinical - Red Word Triage >> May 26, 2024 10:55 AM Hillary B wrote: [facility] that prompted transfer to Nurse Triage: Patient recently had a dosage change in her medication Dulaglutide  (TRULICITY ) 1.5 MG/0.5ML SOAJ to a 3.5 and she is stating that the side effects now are too much for her. She has had a loss in appetite, increased headaches, fatigue, and states that she can feel her heart beating in her throat a lot. She confirmed that her A1C levels have gone down, which is what her PCP wants, but the side effects are becoming too much. Reason for Disposition  [1] Caller has NON-URGENT medicine question about med that PCP prescribed AND [2] triager unable to answer question  Answer Assessment - Initial Assessment Questions 1. NAME of MEDICINE: What medicine(s) are you calling about?     Trulicity   2. QUESTION: What is your question? (e.g., double dose of medicine, side effect)     States she has been experiencing side effects for past 2-3 weeks, after increasing dosage to 3 mg... seeking provider's advise on how to proceed 3. PRESCRIBER: Who prescribed the medicine? Reason: if prescribed by specialist, call should be referred to that group.     PCP/other provider at PCP office 4. SYMPTOMS: Do you have any symptoms? If Yes, ask: What symptoms are you having?  How bad are the symptoms (e.g., mild, moderate, severe)     No appetite, headache, fatigue,  intense heart beat    States she is leaving for vacation on Monday States her HR has stayed below 80 States BS has stayed between 113-150 A1C was 7.6 via home test last week  Protocols used: Medication Question Call-A-AH

## 2024-05-29 ENCOUNTER — Telehealth: Admitting: Family Medicine

## 2024-05-29 VITALS — BP 120/80 | HR 80

## 2024-05-29 DIAGNOSIS — J019 Acute sinusitis, unspecified: Secondary | ICD-10-CM

## 2024-05-29 MED ORDER — DOXYCYCLINE HYCLATE 100 MG PO TABS: 100.0000 mg | ORAL_TABLET | Freq: Two times a day (BID) | ORAL | 0 refills | Status: DC

## 2024-05-29 NOTE — Progress Notes (Signed)
 Virtual Visit via Video Note  I connected with Nicole Mata on 05/29/24 at 10:50 AM EDT by a video enabled telemedicine application and verified that I am speaking with the correct person using two identifiers.   I discussed the limitations of evaluation and management by telemedicine and the availability of in person appointments. The patient expressed understanding and agreed to proceed.  Patient location: at home Provider location: in office  Subjective:    CC:   Chief Complaint  Patient presents with   Sinusitis    HPI: She says she started feeling bad on Monday she suddenly felt very tired her eyes were burning the heat had gone out at work so it was also really hot but the time she got home she checked her temperature and it was 99.4.  And then later that night it was 100.2.  The next day she did try to go to work again in the heat but ended up going straight to bed as soon as she got home because she just felt exhausted she was starting to get a little drainage and started taking some Tylenol  Sinus.  By Wednesday the drainage was worse her ears were hurting.  Her chest felt okay.  She also had a lot of sinus pressure on Tuesday and Wednesday.  Today the pressure is still there but it is not as severe she has a little bit more drainage and congestion then she did.  But no fever so far today.  She has not done a home COVID or flu test.  As far she knows nobody else at work has been sick she did have a little bit of cough with drainage this morning.   Past medical history, Surgical history, Family history not pertinant except as noted below, Social history, Allergies, and medications have been entered into the medical record, reviewed, and corrections made.    Objective:    General: Speaking clearly in complete sentences without any shortness of breath.  Alert and oriented x3.  Normal judgment. No apparent acute distress.    Impression and Recommendations:    Problem List  Items Addressed This Visit   None Visit Diagnoses       Acute non-recurrent sinusitis, unspecified location    -  Primary   Relevant Medications   doxycycline  (VIBRA -TABS) 100 MG tablet       We discussed that symptoms sound most consistent with a viral illness.  Recommend continue with symptomatic care and also recommend starting some nasal saline irrigation she is getting ready to leave town and so we did discuss picking up an antibiotic in case she gets worse then she could fill it and start it.  So I did go ahead and send that over to the pharmacy.  She also wanted to let me know that she just had her eye exam done on the 23rd and wanted to make sure that we got her report from her eye doctor and she had asked them to send it to us .  No orders of the defined types were placed in this encounter.   Meds ordered this encounter  Medications   doxycycline  (VIBRA -TABS) 100 MG tablet    Sig: Take 1 tablet (100 mg total) by mouth 2 (two) times daily.    Dispense:  14 tablet    Refill:  0     I discussed the assessment and treatment plan with the patient. The patient was provided an opportunity to ask questions and all were  answered. The patient agreed with the plan and demonstrated an understanding of the instructions.   The patient was advised to call back or seek an in-person evaluation if the symptoms worsen or if the condition fails to improve as anticipated.   Dorothyann Byars, MD

## 2024-05-29 NOTE — Progress Notes (Signed)
 Pt reports that her sxs started 4 days ago.

## 2024-06-18 ENCOUNTER — Other Ambulatory Visit: Payer: Self-pay | Admitting: Family Medicine

## 2024-07-21 ENCOUNTER — Ambulatory Visit: Admitting: Family Medicine

## 2024-07-21 ENCOUNTER — Encounter: Payer: Self-pay | Admitting: Family Medicine

## 2024-07-21 VITALS — BP 112/67 | HR 92 | Ht 67.0 in | Wt 185.6 lb

## 2024-07-21 DIAGNOSIS — Z7984 Long term (current) use of oral hypoglycemic drugs: Secondary | ICD-10-CM

## 2024-07-21 DIAGNOSIS — E118 Type 2 diabetes mellitus with unspecified complications: Secondary | ICD-10-CM | POA: Diagnosis not present

## 2024-07-21 DIAGNOSIS — T887XXA Unspecified adverse effect of drug or medicament, initial encounter: Secondary | ICD-10-CM

## 2024-07-21 DIAGNOSIS — Z7985 Long-term (current) use of injectable non-insulin antidiabetic drugs: Secondary | ICD-10-CM

## 2024-07-21 LAB — POCT GLYCOSYLATED HEMOGLOBIN (HGB A1C): Hemoglobin A1C: 7.9 % — AB (ref 4.0–5.6)

## 2024-07-21 MED ORDER — TRULICITY 3 MG/0.5ML ~~LOC~~ SOAJ
3.0000 mg | SUBCUTANEOUS | Status: DC
Start: 2024-07-21 — End: 2024-09-04

## 2024-07-21 NOTE — Progress Notes (Signed)
 Established Patient Office Visit  Subjective  Patient ID: Nicole Mata, female    DOB: 07/04/1961  Age: 63 y.o. MRN: [id number]  Chief Complaint  Patient presents with   Diabetes    HPI   Discussed the use of AI scribe software for clinical note transcription with the patient, who gave verbal consent to proceed.  History of Present Illness Nicole Mata is a 63 year old female with type 2 diabetes who presents for follow-up of her diabetes management.  Glycemic control - Type 2 diabetes managed with Trulicity  and Metformin  - HbA1c improved from 8.1% in May to 7.2% currently - Recent home HbA1c test: 6.8% - Recent blood glucose: 113 mg/dL (a couple of weeks ago)  Gastrointestinal symptoms - Nausea after taking Trulicity , particularly on Friday nights - Nausea managed with ginger chews and occasional Zofran  - Regular bowel movements - Occasional diarrhea and constipation managed with Miralax  and magnesium-based products as needed  Constitutional symptoms - No new symptoms or changes in condition     ROS    Objective:     BP 112/67   Pulse 92   Ht 5' 7 (1.702 m)   Wt 185 lb 9.6 oz (84.2 kg)   LMP 12/04/2013   SpO2 99%   BMI 29.07 kg/m    Physical Exam Vitals and nursing note reviewed.  Constitutional:      Appearance: Normal appearance.  HENT:     Head: Normocephalic and atraumatic.  Eyes:     Conjunctiva/sclera: Conjunctivae normal.  Cardiovascular:     Rate and Rhythm: Normal rate and regular rhythm.  Pulmonary:     Effort: Pulmonary effort is normal.     Breath sounds: Normal breath sounds.  Skin:    General: Skin is warm and dry.  Neurological:     Mental Status: She is alert.  Psychiatric:        Mood and Affect: Mood normal.      Results for orders placed or performed in visit on 07/21/24  POCT HgB A1C  Result Value Ref Range   Hemoglobin A1C 7.9 (A) 4.0 - 5.6 %   HbA1c POC (<> result, manual entry)     HbA1c, POC (prediabetic  range)     HbA1c, POC (controlled diabetic range)        The 10-year ASCVD risk score (Arnett DK, et al., 2019) is: 5.1%    Assessment & Plan:   Problem List Items Addressed This Visit       Endocrine   Controlled diabetes mellitus type 2 with complications (HCC) - Primary   Relevant Medications   Dulaglutide  (TRULICITY ) 3 MG/0.5ML SOAJ   Other Relevant Orders   POCT HgB A1C (Completed)   Urine Microalbumin w/creat. ratio   Other Visit Diagnoses       Medication side effect           Assessment and Plan Assessment & Plan Type 2 diabetes mellitus managed with Trulicity  and metformin  Type 2 diabetes mellitus with improved glycemic control. A1c decreased from 8.1% in May to 7.2% currently. Home A1c test shows 6.8%. - Increase Trulicity  dose if tolerated. Monitor for increased nausea. If nausea becomes intolerable, consider returning to previous dose. - Continue metformin . - Check urine sample for protein loss due to improved A1c.  Adverse effects of Trulicity  (nausea, diarrhea, constipation) Nausea persists with Trulicity , especially after Friday night dose. Occasional diarrhea and constipation reported. Nausea managed with dietary adjustments and ginger chews. Zofran  available for nausea  management. Miralax  and Dulcolax available for constipation management. - Try increasing protein intake to manage nausea. - Use ginger chews for nausea relief. - Use Zofran  if nausea becomes severe. - Use Miralax  or Dulcolax as needed for constipation.   Return in about 3 months (around 10/28/2024) for Diabetes follow-up.    Dorothyann Byars, MD

## 2024-07-22 LAB — MICROALBUMIN / CREATININE URINE RATIO
Creatinine, Urine: 249 mg/dL
Microalb/Creat Ratio: 13 mg/g{creat} (ref 0–29)
Microalbumin, Urine: 32.6 ug/mL

## 2024-07-22 LAB — SPECIMEN STATUS REPORT

## 2024-07-23 ENCOUNTER — Ambulatory Visit: Payer: Self-pay | Admitting: Family Medicine

## 2024-07-23 NOTE — Progress Notes (Signed)
 Hi Emoree, no excess protein in the urine this time which is fantastic.

## 2024-08-01 ENCOUNTER — Ambulatory Visit: Payer: BC Managed Care – PPO | Admitting: Podiatry

## 2024-08-29 ENCOUNTER — Telehealth: Payer: Self-pay

## 2024-08-29 NOTE — Telephone Encounter (Signed)
 Pharmacy Patient Advocate Encounter   Received notification from CoverMyMeds that prior authorization for Rybelsus  3MG  tablets is due for renewal.   Insurance verification completed.   The patient is insured through [facility].  Action: Medication has been discontinued. Archived Key: [id number]

## 2024-08-31 ENCOUNTER — Other Ambulatory Visit: Payer: Self-pay | Admitting: Family Medicine

## 2024-09-02 NOTE — Telephone Encounter (Signed)
 Pt called to check on refill. She is wanting the 1.5 refilled. She stated that she can't do the 3.0 it makes her so sick. Can you please refill the 1.5 for the patient and advise her when it is.

## 2024-09-04 ENCOUNTER — Telehealth: Payer: Self-pay

## 2024-09-04 ENCOUNTER — Other Ambulatory Visit: Payer: Self-pay | Admitting: Family Medicine

## 2024-09-04 DIAGNOSIS — E118 Type 2 diabetes mellitus with unspecified complications: Secondary | ICD-10-CM

## 2024-09-04 MED ORDER — TRULICITY 1.5 MG/0.5ML ~~LOC~~ SOAJ: 1.5000 mg | SUBCUTANEOUS | 1 refills | Status: DC

## 2024-09-04 MED ORDER — TRULICITY 3 MG/0.5ML ~~LOC~~ SOAJ: 3.0000 mg | SUBCUTANEOUS | Status: DC

## 2024-09-04 NOTE — Telephone Encounter (Unsigned)
 Copied from CRM [id number]. Topic: Clinical - Medication Refill >> Sep 04, 2024  8:50 AM Ivette P wrote: Medication: Dulaglutide  (TRULICITY ) 3 MG/0.5ML SOAJ  Has the patient contacted their pharmacy? Yes (Agent: If no, request that the patient contact the pharmacy for the refill. If patient does not wish to contact the pharmacy document the reason why and proceed with request.) (Agent: If yes, when and what did the pharmacy advise?)  This is the patient's preferred pharmacy:  [id number] - [place], [place] - [address] [place] [address] Phone: [phone] Fax: [phone]  Is this the correct pharmacy for this prescription? Yes If no, delete pharmacy and type the correct one.   Has the prescription been filled recently? No  Is the patient out of the medication? Yes, pt is out of medication   Has the patient been seen for an appointment in the last year OR does the patient have an upcoming appointment? Yes  Can we respond through MyChart? Yes  Agent: Please be advised that Rx refills may take up to 3 business days. We ask that you follow-up with your pharmacy.

## 2024-09-04 NOTE — Telephone Encounter (Signed)
 Copied from CRM [id number]. Topic: Clinical - Prescription Issue >> Sep 04, 2024  8:52 AM Nicole Mata wrote: Reason for CRM: pt wants phone call once medication is sent in, said needs to get done today. Med refill sent. No record of pt calling in previously to get med refill.   [place] she spoke to someone on Monday

## 2024-09-04 NOTE — Telephone Encounter (Signed)
 Spoke with patient.  States that she can not take the Trulicity  3mg   She states her strength has been reduced previously by Dr. Alvan to 1.5mg   due to adverse s/e of extreme Nausea with the 3mg  dose.  Requesting that the prescription for Trulicity  be changed to 1.5 and resent to pharmacy Requesting to be done today as her next injection is due tomorrow.  Requesting a call back once this is done.

## 2024-09-04 NOTE — Telephone Encounter (Signed)
 Per Dr. Alvan o.k. to change to 1.5 and resend.  Done and pateint informed  =kph

## 2024-09-25 DIAGNOSIS — C44311 Basal cell carcinoma of skin of nose: Secondary | ICD-10-CM | POA: Diagnosis not present

## 2024-09-30 DIAGNOSIS — Z483 Aftercare following surgery for neoplasm: Secondary | ICD-10-CM | POA: Diagnosis not present

## 2024-10-20 ENCOUNTER — Ambulatory Visit: Admitting: Family Medicine

## 2024-10-20 ENCOUNTER — Other Ambulatory Visit (HOSPITAL_BASED_OUTPATIENT_CLINIC_OR_DEPARTMENT_OTHER): Payer: Self-pay

## 2024-10-20 ENCOUNTER — Encounter: Payer: Self-pay | Admitting: Family Medicine

## 2024-10-20 VITALS — BP 125/68 | HR 88 | Ht 67.0 in | Wt 189.0 lb

## 2024-10-20 DIAGNOSIS — M545 Low back pain, unspecified: Secondary | ICD-10-CM | POA: Diagnosis not present

## 2024-10-20 DIAGNOSIS — E1142 Type 2 diabetes mellitus with diabetic polyneuropathy: Secondary | ICD-10-CM | POA: Diagnosis not present

## 2024-10-20 DIAGNOSIS — Z23 Encounter for immunization: Secondary | ICD-10-CM

## 2024-10-20 DIAGNOSIS — E118 Type 2 diabetes mellitus with unspecified complications: Secondary | ICD-10-CM | POA: Diagnosis not present

## 2024-10-20 DIAGNOSIS — K219 Gastro-esophageal reflux disease without esophagitis: Secondary | ICD-10-CM | POA: Diagnosis not present

## 2024-10-20 DIAGNOSIS — Z7984 Long term (current) use of oral hypoglycemic drugs: Secondary | ICD-10-CM

## 2024-10-20 LAB — POCT GLYCOSYLATED HEMOGLOBIN (HGB A1C): Hemoglobin A1C: 7.5 % — AB (ref 4.0–5.6)

## 2024-10-20 MED ORDER — FAMOTIDINE 20 MG PO TABS
20.0000 mg | ORAL_TABLET | Freq: Every day | ORAL | 3 refills | Status: AC
  Filled 2024-10-20: qty 90, 90d supply, fill #0

## 2024-10-20 MED ORDER — MELOXICAM 15 MG PO TABS
15.0000 mg | ORAL_TABLET | Freq: Every day | ORAL | 1 refills | Status: AC | PRN
  Filled 2024-10-20: qty 30, 30d supply, fill #0

## 2024-10-20 MED ORDER — CYCLOBENZAPRINE HCL 10 MG PO TABS
5.0000 mg | ORAL_TABLET | Freq: Every evening | ORAL | 1 refills | Status: AC | PRN
  Filled 2024-10-20: qty 30, 30d supply, fill #0

## 2024-10-20 MED ORDER — GABAPENTIN 100 MG PO CAPS
100.0000 mg | ORAL_CAPSULE | Freq: Every day | ORAL | 3 refills | Status: AC
  Filled 2024-10-20 – 2024-11-11 (×2): qty 90, 90d supply, fill #0

## 2024-10-20 NOTE — Patient Instructions (Addendum)
 Try increasing metformin  to 2 in AM and 1 in evening and see if tolerate that well.   Try the 3mg  Trulicity  in a few weeks and see if you are able to tolerate it.

## 2024-10-20 NOTE — Progress Notes (Signed)
 Established Patient Office Visit  Patient ID: KYLEAH PENSABENE, female    DOB: 11-13-1961  Age: 63 y.o. MRN: [id number] PCP: Alvan Dorothyann BIRCH, MD  Chief Complaint  Patient presents with   Diabetes   Hypertension    Subjective:     HPI  Discussed the use of AI scribe software for clinical note transcription with the patient, who gave verbal consent to proceed.  History of Present Illness CLOIE WOODEN is a 63 year old female with diabetes who presents for management of her blood sugar levels and lower back pain.  Glycemic control - Diabetes mellitus with recent improvement in HbA1c from 8.1% to 7.5% - Goal to lower HbA1c below 7% - Current medications include Trulicity  (limited supply, three doses remaining, availability issues at [facility] causing treatment delays) and metformin  500 mg twice daily  Lower back pain - Persistent lower back pain for the past three to four weeks - Pain associated with recent physical activities including cleaning carpets and moving furniture - Described as a popping sensation with muscle spasms and discomfort on bending or twisting - Pain localized to the lower back without radiation to the legs or head - No shooting pain down the legs or into the head - Muscle spasms present in the lower back - Relief achieved with heat therapy, uses heating pad  Peripheral neuropathy - Gabapentin  100 mg at night for neuropathy  Medication access issues - Considering transferring prescriptions to a different pharmacy due to delays at current pharmacy     ROS    Objective:     LMP 12/04/2013    Physical Exam Vitals and nursing note reviewed.  Constitutional:      Appearance: Normal appearance.  HENT:     Head: Normocephalic and atraumatic.  Eyes:     Conjunctiva/sclera: Conjunctivae normal.  Cardiovascular:     Rate and Rhythm: Normal rate and regular rhythm.  Pulmonary:     Effort: Pulmonary effort is normal.     Breath sounds:  Normal breath sounds.  Musculoskeletal:     Comments: Nontender directly over lumbar spine or SI joint. Neg straight leg raise  Skin:    General: Skin is warm and dry.  Neurological:     Mental Status: She is alert.  Psychiatric:        Mood and Affect: Mood normal.     Physical Exam MUSCULOSKELETAL: Lumbar spine flexion does not cause pain. Lumbar spine extension causes pain. Lumbar spine rotation causes pain. Lumbar spine lateral flexion causes mild pain. Pain localized to lumbar region, no radiation.   Results for orders placed or performed in visit on 10/20/24  POCT HgB A1C  Result Value Ref Range   Hemoglobin A1C 7.5 (A) 4.0 - 5.6 %   HbA1c POC (<> result, manual entry)     HbA1c, POC (prediabetic range)     HbA1c, POC (controlled diabetic range)        The 10-year ASCVD risk score (Arnett DK, et al., 2019) is: 6.9%    Assessment & Plan:   Problem List Items Addressed This Visit       Digestive   GERD   Relevant Medications   famotidine  (PEPCID ) 20 MG tablet     Endocrine   Diabetic peripheral neuropathy (HCC)   Relevant Medications   gabapentin  (NEURONTIN ) 100 MG capsule   cyclobenzaprine  (FLEXERIL ) 10 MG tablet   Other Relevant Orders   Lipid Panel With LDL/HDL Ratio   CMP14+EGFR  Other   Low back pain   Relevant Medications   cyclobenzaprine  (FLEXERIL ) 10 MG tablet   meloxicam  (MOBIC ) 15 MG tablet   Other Visit Diagnoses       Controlled type 2 diabetes mellitus with complication, without long-term current use of insulin  (HCC)    -  Primary   Relevant Orders   POCT HgB A1C (Completed)     Encounter for immunization       Relevant Orders   Flu vaccine trivalent PF, 6mos and older(Flulaval,Afluria,Fluarix,Fluzone) (Completed)       Assessment and Plan Assessment & Plan Type 2 diabetes mellitus with polyneuropathy and unspecified complications A1c improved to 7.5 but remains above target. Trulicity  supply issues noted. Previous 3 mg dose  trial not tolerated. Discussed increasing metformin  for better control. Exercise may aid glycemic control. - Sent Trulicity  prescription to pharmacy for better supply reliability. - Finish current Trulicity  1.5 mg and consider retrying 3 mg dose if tolerated. - Increase metformin  to three times daily, starting with Saturday and Sunday, to assess tolerance. - Continue exercise regimen with treadmill use.  Acute low back strain Pain likely from recent physical activity. Muscle spasms present, no radicular symptoms. Previous chiropractic care noted. - Prescribed meloxicam  for 5-7 days for inflammation. - Recommended two extra strength Tylenol  at bedtime for additional pain relief. - Prescribed muscle relaxer for evening use to alleviate muscle spasms. - Provided low back stretching exercises. - Advised use of heat or ice based on comfort. - Encouraged alternating sitting and standing to prevent stiffness.    Return in about 14 weeks (around 01/26/2025) for Diabetes follow-up.    Dorothyann Byars, MD [facility] at [facility]

## 2024-10-21 ENCOUNTER — Ambulatory Visit: Payer: Self-pay | Admitting: Family Medicine

## 2024-10-21 LAB — LIPID PANEL WITH LDL/HDL RATIO
Cholesterol, Total: 187 mg/dL (ref 100–199)
HDL: 52 mg/dL (ref >39)
LDL Chol Calc (NIH): 114 mg/dL — ABNORMAL HIGH (ref 0–99)
LDL/HDL Ratio: 2.2 ratio (ref 0.0–3.2)
Triglycerides: 118 mg/dL (ref 0–149)
VLDL Cholesterol Cal: 21 mg/dL (ref 5–40)

## 2024-10-21 LAB — CMP14+EGFR
ALT: 11 IU/L (ref 0–32)
AST: 13 IU/L (ref 0–40)
Albumin: 4.2 g/dL (ref 3.9–4.9)
Alkaline Phosphatase: 80 IU/L (ref 49–135)
BUN/Creatinine Ratio: 15 (ref 12–28)
BUN: 12 mg/dL (ref 8–27)
Bilirubin Total: 0.6 mg/dL (ref 0.0–1.2)
CO2: 24 mmol/L (ref 20–29)
Calcium: 9.8 mg/dL (ref 8.7–10.3)
Chloride: 99 mmol/L (ref 96–106)
Creatinine, Ser: 0.81 mg/dL (ref 0.57–1.00)
Globulin, Total: 2.7 g/dL (ref 1.5–4.5)
Glucose: 140 mg/dL — ABNORMAL HIGH (ref 70–99)
Potassium: 4.7 mmol/L (ref 3.5–5.2)
Sodium: 138 mmol/L (ref 134–144)
Total Protein: 6.9 g/dL (ref 6.0–8.5)
eGFR: 83 mL/min/1.73 (ref >59)

## 2024-10-21 NOTE — Progress Notes (Signed)
 Good Morning Nicole Mata, total cholesterol looks good.  LDL slightly elevated at 114.  Continue to work on healthy Mediterranean diet and regular exercise.  Metabolic panel overall looks good.  Glucose was 140.

## 2024-11-11 ENCOUNTER — Other Ambulatory Visit (HOSPITAL_BASED_OUTPATIENT_CLINIC_OR_DEPARTMENT_OTHER): Payer: Self-pay

## 2024-11-11 ENCOUNTER — Other Ambulatory Visit: Payer: Self-pay | Admitting: Family Medicine

## 2024-11-11 ENCOUNTER — Encounter: Payer: Self-pay | Admitting: Family Medicine

## 2024-11-11 DIAGNOSIS — E118 Type 2 diabetes mellitus with unspecified complications: Secondary | ICD-10-CM

## 2024-11-11 DIAGNOSIS — R112 Nausea with vomiting, unspecified: Secondary | ICD-10-CM

## 2024-11-11 DIAGNOSIS — R1013 Epigastric pain: Secondary | ICD-10-CM

## 2024-11-11 MED ORDER — ONDANSETRON 8 MG PO TBDP
8.0000 mg | ORAL_TABLET | Freq: Three times a day (TID) | ORAL | 0 refills | Status: DC | PRN
  Filled 2024-11-11: qty 20, 7d supply, fill #0

## 2024-11-11 MED ORDER — TRULICITY 1.5 MG/0.5ML ~~LOC~~ SOAJ
1.5000 mg | SUBCUTANEOUS | 2 refills | Status: AC
  Filled 2024-11-11: qty 2, 28d supply, fill #0
  Filled 2024-12-10: qty 2, 28d supply, fill #1
  Filled 2025-01-07: qty 2, 28d supply, fill #2

## 2024-11-11 NOTE — Telephone Encounter (Unsigned)
 Copied from CRM [id number]. Topic: Clinical - Medication Refill >> Nov 11, 2024  2:59 PM Rachelle R wrote: Medication: Dulaglutide  (TRULICITY ) 1.5 MG/0.5ML SOAJ  Has the patient contacted their pharmacy? Yes, call dr  This is the patient's preferred pharmacy:  [facility] [place] [facility] [address], [address] [place] [address] Phone: [phone] Fax: [phone]  Is this the correct pharmacy for this prescription? Yes If no, delete pharmacy and type the correct one.   Has the prescription been filled recently? No  Is the patient out of the medication? Yes  Has the patient been seen for an appointment in the last year OR does the patient have an upcoming appointment? Yes  Can we respond through MyChart? No, prefers text message  Agent: Please be advised that Rx refills may take up to 3 business days. We ask that you follow-up with your pharmacy.

## 2024-11-11 NOTE — Telephone Encounter (Signed)
 Copied from CRM [id number]. Topic: Clinical - Medication Question >> Nov 11, 2024  3:03 PM Willma R wrote: Reason for CRM: Patient states she is need of a refill of her nausea medication but does not know the name of it to send request. Patient requested a message be sent to Dr Alvan, stating that she will know what she needs.  Patient can be reached at [phone]

## 2025-01-07 ENCOUNTER — Other Ambulatory Visit (HOSPITAL_BASED_OUTPATIENT_CLINIC_OR_DEPARTMENT_OTHER): Payer: Self-pay

## 2025-01-07 ENCOUNTER — Other Ambulatory Visit: Payer: Self-pay | Admitting: Family Medicine

## 2025-01-07 DIAGNOSIS — R112 Nausea with vomiting, unspecified: Secondary | ICD-10-CM

## 2025-01-07 DIAGNOSIS — R1013 Epigastric pain: Secondary | ICD-10-CM

## 2025-01-07 MED ORDER — ONDANSETRON 8 MG PO TBDP
8.0000 mg | ORAL_TABLET | Freq: Three times a day (TID) | ORAL | 0 refills | Status: AC | PRN
  Filled 2025-01-07: qty 20, 7d supply, fill #0

## 2025-01-08 ENCOUNTER — Other Ambulatory Visit (HOSPITAL_BASED_OUTPATIENT_CLINIC_OR_DEPARTMENT_OTHER): Payer: Self-pay

## 2025-01-08 ENCOUNTER — Other Ambulatory Visit: Payer: Self-pay

## 2025-01-26 ENCOUNTER — Ambulatory Visit: Admitting: Family Medicine
